# Patient Record
Sex: Female | Born: 1990 | Race: Black or African American | Hispanic: No | Marital: Single | Smoking: Current every day smoker
Health system: Southern US, Community
[De-identification: ages and names within clinical notes are randomized; demographics above are authoritative.]

## PROBLEM LIST (undated history)

## (undated) ENCOUNTER — Inpatient Hospital Stay (HOSPITAL_COMMUNITY): Payer: Self-pay

## (undated) ENCOUNTER — Emergency Department (HOSPITAL_COMMUNITY): Admission: EM | Payer: Medicaid Other | Source: Home / Self Care

## (undated) DIAGNOSIS — T7840XA Allergy, unspecified, initial encounter: Secondary | ICD-10-CM

## (undated) DIAGNOSIS — B999 Unspecified infectious disease: Secondary | ICD-10-CM

## (undated) DIAGNOSIS — G8929 Other chronic pain: Secondary | ICD-10-CM

## (undated) DIAGNOSIS — A749 Chlamydial infection, unspecified: Secondary | ICD-10-CM

## (undated) DIAGNOSIS — F329 Major depressive disorder, single episode, unspecified: Secondary | ICD-10-CM

## (undated) DIAGNOSIS — A599 Trichomoniasis, unspecified: Secondary | ICD-10-CM

## (undated) DIAGNOSIS — O139 Gestational [pregnancy-induced] hypertension without significant proteinuria, unspecified trimester: Secondary | ICD-10-CM

## (undated) DIAGNOSIS — R87629 Unspecified abnormal cytological findings in specimens from vagina: Secondary | ICD-10-CM

## (undated) DIAGNOSIS — E7800 Pure hypercholesterolemia, unspecified: Secondary | ICD-10-CM

## (undated) DIAGNOSIS — R519 Headache, unspecified: Secondary | ICD-10-CM

## (undated) DIAGNOSIS — F32A Depression, unspecified: Secondary | ICD-10-CM

## (undated) DIAGNOSIS — R51 Headache: Secondary | ICD-10-CM

## (undated) HISTORY — DX: Headache: R51

## (undated) HISTORY — DX: Other chronic pain: G89.29

## (undated) HISTORY — PX: INDUCED ABORTION: SHX677

## (undated) HISTORY — DX: Depression, unspecified: F32.A

## (undated) HISTORY — PX: WISDOM TOOTH EXTRACTION: SHX21

## (undated) HISTORY — DX: Chlamydial infection, unspecified: A74.9

## (undated) HISTORY — DX: Headache, unspecified: R51.9

## (undated) HISTORY — DX: Major depressive disorder, single episode, unspecified: F32.9

## (undated) HISTORY — DX: Allergy, unspecified, initial encounter: T78.40XA

---
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
## 2008-07-11 ENCOUNTER — Emergency Department (HOSPITAL_COMMUNITY): Admission: EM | Admit: 2008-07-11 | Discharge: 2008-07-12 | Payer: Self-pay | Admitting: Emergency Medicine

## 2008-07-18 ENCOUNTER — Emergency Department (HOSPITAL_COMMUNITY): Admission: EM | Admit: 2008-07-18 | Discharge: 2008-07-18 | Payer: Self-pay | Admitting: Emergency Medicine

## 2008-08-04 ENCOUNTER — Inpatient Hospital Stay (HOSPITAL_COMMUNITY): Admission: AD | Admit: 2008-08-04 | Discharge: 2008-08-04 | Payer: Self-pay | Admitting: Obstetrics

## 2008-08-05 ENCOUNTER — Encounter (INDEPENDENT_AMBULATORY_CARE_PROVIDER_SITE_OTHER): Payer: Self-pay | Admitting: Obstetrics

## 2008-08-05 ENCOUNTER — Inpatient Hospital Stay (HOSPITAL_COMMUNITY): Admission: AD | Admit: 2008-08-05 | Discharge: 2008-08-05 | Payer: Self-pay | Admitting: Obstetrics

## 2008-08-09 ENCOUNTER — Ambulatory Visit (HOSPITAL_COMMUNITY): Admission: RE | Admit: 2008-08-09 | Discharge: 2008-08-09 | Payer: Self-pay | Admitting: Obstetrics

## 2008-12-02 ENCOUNTER — Emergency Department (HOSPITAL_COMMUNITY): Admission: EM | Admit: 2008-12-02 | Discharge: 2008-12-02 | Payer: Self-pay | Admitting: Family Medicine

## 2008-12-07 ENCOUNTER — Emergency Department (HOSPITAL_COMMUNITY): Admission: EM | Admit: 2008-12-07 | Discharge: 2008-12-08 | Payer: Self-pay | Admitting: Emergency Medicine

## 2009-01-11 ENCOUNTER — Emergency Department (HOSPITAL_COMMUNITY): Admission: EM | Admit: 2009-01-11 | Discharge: 2009-01-12 | Payer: Self-pay | Admitting: Emergency Medicine

## 2009-07-25 ENCOUNTER — Inpatient Hospital Stay (HOSPITAL_COMMUNITY): Admission: AD | Admit: 2009-07-25 | Discharge: 2009-07-25 | Payer: Self-pay | Admitting: Obstetrics

## 2009-10-01 ENCOUNTER — Ambulatory Visit (HOSPITAL_COMMUNITY): Admission: RE | Admit: 2009-10-01 | Discharge: 2009-10-01 | Payer: Self-pay | Admitting: Obstetrics

## 2009-11-17 ENCOUNTER — Inpatient Hospital Stay (HOSPITAL_COMMUNITY): Admission: AD | Admit: 2009-11-17 | Discharge: 2009-11-17 | Payer: Self-pay | Admitting: Obstetrics

## 2009-12-15 ENCOUNTER — Inpatient Hospital Stay (HOSPITAL_COMMUNITY): Admission: AD | Admit: 2009-12-15 | Discharge: 2009-12-15 | Payer: Self-pay | Admitting: Obstetrics

## 2010-02-15 ENCOUNTER — Inpatient Hospital Stay (HOSPITAL_COMMUNITY): Admission: AD | Admit: 2010-02-15 | Discharge: 2010-02-15 | Payer: Self-pay | Admitting: Obstetrics

## 2010-03-05 ENCOUNTER — Inpatient Hospital Stay (HOSPITAL_COMMUNITY): Admission: RE | Admit: 2010-03-05 | Discharge: 2010-03-07 | Payer: Self-pay | Admitting: Obstetrics

## 2010-03-11 ENCOUNTER — Inpatient Hospital Stay (HOSPITAL_COMMUNITY): Admission: AD | Admit: 2010-03-11 | Discharge: 2010-03-11 | Payer: Self-pay | Admitting: Obstetrics

## 2010-06-27 ENCOUNTER — Emergency Department (HOSPITAL_COMMUNITY)
Admission: EM | Admit: 2010-06-27 | Discharge: 2010-06-27 | Payer: Self-pay | Source: Home / Self Care | Admitting: Family Medicine

## 2010-11-03 ENCOUNTER — Encounter: Payer: Self-pay | Admitting: Obstetrics

## 2010-12-30 LAB — CBC
HCT: 32 % — ABNORMAL LOW (ref 36.0–46.0)
HCT: 34.8 % — ABNORMAL LOW (ref 36.0–46.0)
HCT: 37.4 % (ref 36.0–46.0)
Hemoglobin: 11 g/dL — ABNORMAL LOW (ref 12.0–15.0)
Hemoglobin: 12.1 g/dL (ref 12.0–15.0)
Hemoglobin: 12.7 g/dL (ref 12.0–15.0)
MCHC: 34 g/dL (ref 30.0–36.0)
MCHC: 34.3 g/dL (ref 30.0–36.0)
MCHC: 34.7 g/dL (ref 30.0–36.0)
MCV: 85.1 fL (ref 78.0–100.0)
MCV: 86.3 fL (ref 78.0–100.0)
MCV: 86.7 fL (ref 78.0–100.0)
Platelets: 149 10*3/uL — ABNORMAL LOW (ref 150–400)
Platelets: 151 10*3/uL (ref 150–400)
Platelets: 222 10*3/uL (ref 150–400)
RBC: 3.69 MIL/uL — ABNORMAL LOW (ref 3.87–5.11)
RBC: 4.03 MIL/uL (ref 3.87–5.11)
RBC: 4.4 MIL/uL (ref 3.87–5.11)
RDW: 14.8 % (ref 11.5–15.5)
RDW: 14.9 % (ref 11.5–15.5)
RDW: 15.2 % (ref 11.5–15.5)
WBC: 10.1 10*3/uL (ref 4.0–10.5)
WBC: 11.9 10*3/uL — ABNORMAL HIGH (ref 4.0–10.5)
WBC: 19.1 10*3/uL — ABNORMAL HIGH (ref 4.0–10.5)

## 2010-12-30 LAB — CCBB MATERNAL DONOR DRAW

## 2010-12-30 LAB — RPR: RPR Ser Ql: NONREACTIVE

## 2011-01-01 LAB — URINALYSIS, ROUTINE W REFLEX MICROSCOPIC
Bilirubin Urine: NEGATIVE
Glucose, UA: NEGATIVE mg/dL
Hgb urine dipstick: NEGATIVE
Ketones, ur: NEGATIVE mg/dL
Nitrite: NEGATIVE
Protein, ur: NEGATIVE mg/dL
Specific Gravity, Urine: 1.01 (ref 1.005–1.030)
Urobilinogen, UA: 0.2 mg/dL (ref 0.0–1.0)
pH: 7 (ref 5.0–8.0)

## 2011-01-01 LAB — URINE CULTURE

## 2011-01-01 LAB — COMPREHENSIVE METABOLIC PANEL
ALT: 10 U/L (ref 0–35)
AST: 18 U/L (ref 0–37)
Albumin: 3 g/dL — ABNORMAL LOW (ref 3.5–5.2)
Alkaline Phosphatase: 47 U/L (ref 39–117)
BUN: 4 mg/dL — ABNORMAL LOW (ref 6–23)
CO2: 25 mEq/L (ref 19–32)
Calcium: 9 mg/dL (ref 8.4–10.5)
Chloride: 107 mEq/L (ref 96–112)
Creatinine, Ser: 0.49 mg/dL (ref 0.4–1.2)
GFR calc Af Amer: >60 mL/min (ref >60)
GFR calc non Af Amer: >60 mL/min (ref >60)
Glucose, Bld: 70 mg/dL (ref 70–99)
Potassium: 3.9 mEq/L (ref 3.5–5.1)
Sodium: 136 mEq/L (ref 135–145)
Total Bilirubin: 0.1 mg/dL — ABNORMAL LOW (ref 0.3–1.2)
Total Protein: 6.1 g/dL (ref 6.0–8.3)

## 2011-01-01 LAB — CBC
HCT: 33.3 % — ABNORMAL LOW (ref 36.0–46.0)
Hemoglobin: 11.4 g/dL — ABNORMAL LOW (ref 12.0–15.0)
MCHC: 34.3 g/dL (ref 30.0–36.0)
MCV: 90.7 fL (ref 78.0–100.0)
Platelets: 159 10*3/uL (ref 150–400)
RBC: 3.67 MIL/uL — ABNORMAL LOW (ref 3.87–5.11)
RDW: 13.4 % (ref 11.5–15.5)
WBC: 10.6 10*3/uL — ABNORMAL HIGH (ref 4.0–10.5)

## 2011-01-01 LAB — URINE MICROSCOPIC-ADD ON

## 2011-01-01 LAB — WET PREP, GENITAL
Clue Cells Wet Prep HPF POC: NONE SEEN
Trich, Wet Prep: NONE SEEN
Yeast Wet Prep HPF POC: NONE SEEN

## 2011-01-01 LAB — GC/CHLAMYDIA PROBE AMP, GENITAL
Chlamydia, DNA Probe: NEGATIVE
GC Probe Amp, Genital: NEGATIVE

## 2011-01-05 LAB — WET PREP, GENITAL
Clue Cells Wet Prep HPF POC: NONE SEEN
Trich, Wet Prep: NONE SEEN
Yeast Wet Prep HPF POC: NONE SEEN

## 2011-01-05 LAB — GC/CHLAMYDIA PROBE AMP, GENITAL
Chlamydia, DNA Probe: NEGATIVE
GC Probe Amp, Genital: NEGATIVE

## 2011-01-05 LAB — URINALYSIS, ROUTINE W REFLEX MICROSCOPIC
Bilirubin Urine: NEGATIVE
Glucose, UA: NEGATIVE mg/dL
Hgb urine dipstick: NEGATIVE
Ketones, ur: NEGATIVE mg/dL
Nitrite: NEGATIVE
Protein, ur: NEGATIVE mg/dL
Specific Gravity, Urine: 1.01 (ref 1.005–1.030)
Urobilinogen, UA: 0.2 mg/dL (ref 0.0–1.0)
pH: 5.5 (ref 5.0–8.0)

## 2011-01-05 LAB — URINE MICROSCOPIC-ADD ON: RBC / HPF: NONE SEEN RBC/hpf (ref <3)

## 2011-01-05 LAB — CBC
HCT: 32.5 % — ABNORMAL LOW (ref 36.0–46.0)
Hemoglobin: 11.1 g/dL — ABNORMAL LOW (ref 12.0–15.0)
MCHC: 34.2 g/dL (ref 30.0–36.0)
MCV: 89.2 fL (ref 78.0–100.0)
Platelets: 151 10*3/uL (ref 150–400)
RBC: 3.65 MIL/uL — ABNORMAL LOW (ref 3.87–5.11)
RDW: 13.3 % (ref 11.5–15.5)
WBC: 7 10*3/uL (ref 4.0–10.5)

## 2011-01-16 LAB — URINE MICROSCOPIC-ADD ON

## 2011-01-16 LAB — URINALYSIS, ROUTINE W REFLEX MICROSCOPIC
Bilirubin Urine: NEGATIVE
Glucose, UA: NEGATIVE mg/dL
Hgb urine dipstick: NEGATIVE
Ketones, ur: NEGATIVE mg/dL
Nitrite: NEGATIVE
Protein, ur: NEGATIVE mg/dL
Specific Gravity, Urine: <1.005 — ABNORMAL LOW (ref 1.005–1.030)
Urobilinogen, UA: 0.2 mg/dL (ref 0.0–1.0)
pH: 6.5 (ref 5.0–8.0)

## 2011-01-16 LAB — HCG, QUANTITATIVE, PREGNANCY: hCG, Beta Chain, Quant, S: 83722 m[IU]/mL — ABNORMAL HIGH (ref <5)

## 2011-01-16 LAB — CBC
HCT: 35.6 % — ABNORMAL LOW (ref 36.0–46.0)
Hemoglobin: 12.1 g/dL (ref 12.0–15.0)
MCHC: 33.8 g/dL (ref 30.0–36.0)
MCV: 89.9 fL (ref 78.0–100.0)
Platelets: 180 10*3/uL (ref 150–400)
RBC: 3.96 MIL/uL (ref 3.87–5.11)
RDW: 13.6 % (ref 11.5–15.5)
WBC: 9.7 10*3/uL (ref 4.0–10.5)

## 2011-01-16 LAB — GC/CHLAMYDIA PROBE AMP, GENITAL
Chlamydia, DNA Probe: NEGATIVE
GC Probe Amp, Genital: NEGATIVE

## 2011-01-16 LAB — WET PREP, GENITAL
Clue Cells Wet Prep HPF POC: NONE SEEN
Trich, Wet Prep: NONE SEEN
Yeast Wet Prep HPF POC: NONE SEEN

## 2011-01-16 LAB — POCT PREGNANCY, URINE: Preg Test, Ur: POSITIVE

## 2011-01-22 LAB — HEPATIC FUNCTION PANEL
ALT: 11 U/L (ref 0–35)
AST: 16 U/L (ref 0–37)
Albumin: 3.8 g/dL (ref 3.5–5.2)
Alkaline Phosphatase: 63 U/L (ref 39–117)
Bilirubin, Direct: 0.2 mg/dL (ref 0.0–0.3)
Indirect Bilirubin: 0.3 mg/dL (ref 0.3–0.9)
Total Bilirubin: 0.5 mg/dL (ref 0.3–1.2)
Total Protein: 7 g/dL (ref 6.0–8.3)

## 2011-01-22 LAB — BASIC METABOLIC PANEL
BUN: 5 mg/dL — ABNORMAL LOW (ref 6–23)
CO2: 26 mEq/L (ref 19–32)
Calcium: 8.7 mg/dL (ref 8.4–10.5)
Chloride: 106 mEq/L (ref 96–112)
Creatinine, Ser: 0.91 mg/dL (ref 0.4–1.2)
GFR calc Af Amer: >60 mL/min (ref >60)
GFR calc non Af Amer: >60 mL/min (ref >60)
Glucose, Bld: 80 mg/dL (ref 70–99)
Potassium: 3.3 mEq/L — ABNORMAL LOW (ref 3.5–5.1)
Sodium: 137 mEq/L (ref 135–145)

## 2011-01-22 LAB — DIFFERENTIAL
Basophils Absolute: 0 10*3/uL (ref 0.0–0.1)
Basophils Relative: 0 % (ref 0–1)
Eosinophils Absolute: 0.1 10*3/uL (ref 0.0–0.7)
Eosinophils Relative: 1 % (ref 0–5)
Lymphocytes Relative: 36 % (ref 12–46)
Lymphs Abs: 2.7 10*3/uL (ref 0.7–4.0)
Monocytes Absolute: 0.5 10*3/uL (ref 0.1–1.0)
Monocytes Relative: 7 % (ref 3–12)
Neutro Abs: 4.1 10*3/uL (ref 1.7–7.7)
Neutrophils Relative %: 55 % (ref 43–77)

## 2011-01-22 LAB — URINALYSIS, ROUTINE W REFLEX MICROSCOPIC
Glucose, UA: NEGATIVE mg/dL
Hgb urine dipstick: NEGATIVE
Ketones, ur: 15 mg/dL — AB
Nitrite: NEGATIVE
Protein, ur: NEGATIVE mg/dL
Specific Gravity, Urine: 1.024 (ref 1.005–1.030)
Urobilinogen, UA: 1 mg/dL (ref 0.0–1.0)
pH: 6 (ref 5.0–8.0)

## 2011-01-22 LAB — CBC
HCT: 42.8 % (ref 36.0–46.0)
Hemoglobin: 14 g/dL (ref 12.0–15.0)
MCHC: 32.8 g/dL (ref 30.0–36.0)
MCV: 88.2 fL (ref 78.0–100.0)
Platelets: 212 10*3/uL (ref 150–400)
RBC: 4.86 MIL/uL (ref 3.87–5.11)
RDW: 13.8 % (ref 11.5–15.5)
WBC: 7.4 10*3/uL (ref 4.0–10.5)

## 2011-01-22 LAB — URINE MICROSCOPIC-ADD ON

## 2011-01-22 LAB — POCT PREGNANCY, URINE: Preg Test, Ur: NEGATIVE

## 2011-01-22 LAB — LIPASE, BLOOD: Lipase: 18 U/L (ref 11–59)

## 2011-01-28 LAB — URINALYSIS, ROUTINE W REFLEX MICROSCOPIC
Bilirubin Urine: NEGATIVE
Glucose, UA: NEGATIVE mg/dL
Hgb urine dipstick: NEGATIVE
Ketones, ur: NEGATIVE mg/dL
Nitrite: NEGATIVE
Protein, ur: NEGATIVE mg/dL
Specific Gravity, Urine: 1.012 (ref 1.005–1.030)
Urobilinogen, UA: 1 mg/dL (ref 0.0–1.0)
pH: 6 (ref 5.0–8.0)

## 2011-01-28 LAB — RAPID STREP SCREEN (MED CTR MEBANE ONLY): Streptococcus, Group A Screen (Direct): NEGATIVE

## 2011-02-28 NOTE — H&P (Signed)
NAME:  Katelyn Mcfarland, RIEF                         ACCOUNT NO.:  [id number]   MEDICAL RECORD NO.:  [id number]                   PATIENT TYPE:  INP   LOCATION:  0102                                 FACILITY:  [facility]   PHYSICIAN:  Cindie Crumbly, M.D.               DATE OF BIRTH:  1990/12/08   DATE OF ADMISSION:  07/27/2002  DATE OF DISCHARGE:                         PSYCHIATRIC ADMISSION ASSESSMENT   PATIENT IDENTIFICATION:  This 20 year old African-American female was  involuntarily admitted complaining of depression with suicidal ideation with  a plan to cut her wrists.  She had written a suicide note in school and when  confronted about the note, she admitted to suicidal ideation with a plan to  cut her wrists and refused to contract for safety.   HISTORY OF PRESENT ILLNESS:  The patient complains of an increasingly  depressed, irritable, and angry mood most of the day nearly every day,  anhedonia, giving up on activities previously found pleasurable.  She  reports that her symptoms of depression have been worsening over the past  one to two years.  She admits to psychomotor agitation, decreased  concentration and energy level, increased symptoms of fatigue, decreased  appetite, weight gain, and insomnia.  She admits to feelings of  hopelessness, helplessness, and worthlessness.  She continues to refuse to  contract for safety.   PAST PSYCHIATRIC HISTORY:  The patient's past psychiatric history is  significant for oppositional and defiant behavior.  She was followed in  outpatient therapy at [facility] in 2002 where she was seen on a weekly  basis but has not been in any therapy for the past year.  She has no history  of previous inpatient treatment or previous psychiatric treatment.   SUBSTANCE ABUSE HISTORY:  She denies any use of alcohol, tobacco, or street  drugs.   PAST MEDICAL HISTORY:  The patient's past medical history is significant for  obesity and having tympanostomy  tubes in her ears at age 82 months.   ALLERGIES:  She has no known drug allergies or sensitivities.   CURRENT MEDICATIONS:  She is on no current medication.   FAMILY AND SOCIAL HISTORY:  The patient lives with her mother and 16-year-  old sister.  Paternal uncle has a history of polysubstance dependence.  The  patient otherwise denies any family psychiatric history.  The patient is  currently in the eighth grade.   MENTAL STATUS EXAM:  The patient presents as a well developed, well  nourished, obese, adolescent African-American female who is alert and  oriented x 4, psychomotor agitated, and whose appearance is compatible with  her stated age.  Speech is coherent with a decreased rate and volume of  speech, increased speech latency.  She displays no looseness of  associations, phonemic errors, or evidence of a thought disorder.  Her  concentration is decreased.  She displays poor impulse control.  Her affect  and mood are  depressed and irritable.  Immediate recall, short-term memory,  and remote memory are intact.  Similarities and differences are within  normal limits and she is able to abstract to simple proverbs.  Her thought  processes are goal directed.   ADMISSION DIAGNOSES:    AXIS I:  1. Major depression, single episode, severe without psychosis.  2. Oppositional defiant disorder.  3. Rule out conduct disorder.   AXIS II:  1. Rule out learning disorder, not otherwise specified.  2. Rule out personality disorder, not otherwise specified.   AXIS III:  Obesity.   AXIS IV:  Severe.   AXIS V:  20   ASSETS AND STRENGTHS:  Her mother is very supportive of her.   INITIAL PLAN OF CARE:  Initial plan of care is to begin the patient on a  trial of Lexapro.  I discussed with the patient and her mother the risks,  benefits, side effects, and alternatives including the alternative of using  no medication.  They appear to understand and agree to this trial of  Lexapro.   Psychotherapy will focus on improving the patient's impulse  control, decreasing cognitive distortions and potential for self-harm.  A  laboratory workup will also be initiated to rule out any other medical  problems contributing to her symptomatology.   ESTIMATED LENGTH OF STAY:  The estimated length of stay for the patient on  the inpatient unit is five to seven days.   POST HOSPITAL CARE PLAN:  Initial discharge plan is to discharge the patient  to home.                                               Cindie Crumbly, M.D.    TS/MEDQ  D:  07/28/2002  T:  07/29/2002  Job:  [id number]

## 2011-02-28 NOTE — Discharge Summary (Signed)
NAME:  Katelyn Mcfarland, Katelyn Mcfarland                         ACCOUNT NO.:  [id number]   MEDICAL RECORD NO.:  [id number]                   PATIENT TYPE:  INP   LOCATION:  0102                                 FACILITY:  [facility]   PHYSICIAN:  Cindie Crumbly, M.D.               DATE OF BIRTH:  December 15, 1990   DATE OF ADMISSION:  07/27/2002  DATE OF DISCHARGE:                                 DISCHARGE SUMMARY   REASON FOR ADMISSION:  This 20 year old African-American female was  involuntarily admitted complaining of depression and suicidal ideation with  a plan to cut her wrists.  For further history of present illness, please  see the patient's psychiatry admission assessment.   PHYSICAL EXAMINATION:  At the time of admission was significant for obesity.  She had an otherwise unremarkable physical examination.   LABORATORY EXAMINATION:  The patient underwent a laboratory workup to rule  out any medical problems contributing to her symptomatology.  A urine probe  for gonorrhea and chlamydia were negative.  TSH and free T4 were within  normal limits.  Hepatic panel was within normal limits.  A GGT was within  normal limits.  Routine chem panel was within normal limits.  CBC was within  normal limits.  Urine drug screen was negative.  Urine pregnancy test was  negative.  A UA was unremarkable.  An RPR was nonreactive.  The patient  received no x-rays, no special procedures, no additional consultations.  She  sustained no complications during the course of this hospitalization.   HOSPITAL COURSE:  On admission, the patient was psychomotor agitated.  Her  affect and mood were depressed and irritable.  Concentration was decreased.  She displayed poor impulse control.  She was begun on a trial of Lexapro.  She tolerated this medication well without side effects.  She has been  actively participating in all aspects of the therapeutic treatment program.  At the present time she is denying any homicidal or  suicidal ideation, is  motivated for outpatient therapy, no longer appears to be a danger to  herself or others,  and consequently is felt to have reached her maximum  benefits of hospitalization and is ready for discharge to a less restricted  alternative setting.   CONDITION ON DISCHARGE:  Improved.   DISCHARGE DIAGNOSES:   AXIS I:  1. Major depression, single episode, severe, without psychosis.  2. Oppositional-defiant disorder.  3. Rule out conduct disorder.   AXIS II:  1. Rule out learning disorder not otherwise specified.  2. Rule out personality disorder not otherwise specified.   AXIS III:  Obesity.   AXIS IV:  Severe.   AXIS V:  Code 20 on admission, code 30 on discharge.   FURTHER EVALUATION AND TREATMENT RECOMMENDATIONS:  1. The patient is discharged to home.  2. She is discharged on an unrestricted level of activity and a regular     diet.  3.  She will follow up with her outpatient psychiatrist at [facility] for all further aspects of her psychiatric care and     consequently I will sign off on the case at this time.  She will follow     up with her primary care physician for all further aspects of her medical     care.   DISCHARGE MEDICATIONS:  Lexapro 10 mg p.o. q.d.                                                 Cindie Crumbly, M.D.    TS/MEDQ  D:  08/02/2002  T:  08/02/2002  Job:  [id number]

## 2011-04-24 LAB — HM PAP SMEAR: HM Pap smear: NEGATIVE

## 2011-07-03 LAB — COMPREHENSIVE METABOLIC PANEL
ALT: 15
ALT: 16
ALT: 19
AST: 23
AST: 23
AST: 27
Albumin: 2.3 — ABNORMAL LOW
Albumin: 2.7 — ABNORMAL LOW
Albumin: 3.1 — ABNORMAL LOW
Alkaline Phosphatase: 112
Alkaline Phosphatase: 129 — ABNORMAL HIGH
Alkaline Phosphatase: 146 — ABNORMAL HIGH
BUN: 3 — ABNORMAL LOW
BUN: 4 — ABNORMAL LOW
BUN: 7
CO2: 22
CO2: 23
CO2: 26
Calcium: 6.6 — ABNORMAL LOW
Calcium: 7.4 — ABNORMAL LOW
Calcium: 8.9
Chloride: 101
Chloride: 103
Chloride: 105
Creatinine, Ser: 0.71
Creatinine, Ser: 0.77
Creatinine, Ser: 0.8
GFR calc Af Amer: >60
GFR calc Af Amer: >60
GFR calc Af Amer: >60
GFR calc non Af Amer: >60
GFR calc non Af Amer: >60
GFR calc non Af Amer: >60
Glucose, Bld: 82
Glucose, Bld: 94
Glucose, Bld: 95
Potassium: 3.8
Potassium: 4.1
Potassium: 4.2
Sodium: 130 — ABNORMAL LOW
Sodium: 136
Sodium: 136
Total Bilirubin: 0.4
Total Bilirubin: 0.6
Total Bilirubin: 0.8
Total Protein: 5.3 — ABNORMAL LOW
Total Protein: 6
Total Protein: 6.6

## 2011-07-03 LAB — CBC
HCT: 33.5 — ABNORMAL LOW
HCT: 36.4
HCT: 39.7
Hemoglobin: 11.7 — ABNORMAL LOW
Hemoglobin: 12.7
Hemoglobin: 13.7
MCHC: 34.4
MCHC: 34.8
MCHC: 35
MCV: 86.8
MCV: 87.6
MCV: 87.7
Platelets: 138 — ABNORMAL LOW
Platelets: 139 — ABNORMAL LOW
Platelets: 166
RBC: 3.83 — ABNORMAL LOW
RBC: 4.19
RBC: 4.53
RDW: 14.2
RDW: 14.3
RDW: 14.5
WBC: 10.5
WBC: 10.9 — ABNORMAL HIGH
WBC: 6.2

## 2011-07-03 LAB — URINALYSIS, ROUTINE W REFLEX MICROSCOPIC
Bilirubin Urine: NEGATIVE
Bilirubin Urine: NEGATIVE
Glucose, UA: NEGATIVE
Glucose, UA: NEGATIVE
Hgb urine dipstick: NEGATIVE
Ketones, ur: NEGATIVE
Ketones, ur: NEGATIVE
Nitrite: NEGATIVE
Nitrite: NEGATIVE
Protein, ur: 100 — AB
Protein, ur: NEGATIVE
Specific Gravity, Urine: 1.015
Specific Gravity, Urine: 1.02
Urobilinogen, UA: 0.2
Urobilinogen, UA: 0.2
pH: 5.5
pH: 6

## 2011-07-03 LAB — URINE MICROSCOPIC-ADD ON

## 2011-07-03 LAB — RPR: RPR Ser Ql: NONREACTIVE

## 2011-07-03 LAB — URIC ACID
Uric Acid, Serum: 5.8
Uric Acid, Serum: 6
Uric Acid, Serum: 6.1

## 2011-07-03 LAB — LACTATE DEHYDROGENASE
LDH: 145
LDH: 160
LDH: 207

## 2011-07-03 LAB — MAGNESIUM: Magnesium: 6.3

## 2011-07-14 LAB — URINALYSIS, ROUTINE W REFLEX MICROSCOPIC
Bilirubin Urine: NEGATIVE
Bilirubin Urine: NEGATIVE
Glucose, UA: NEGATIVE
Glucose, UA: NEGATIVE
Hgb urine dipstick: NEGATIVE
Ketones, ur: NEGATIVE
Ketones, ur: NEGATIVE
Nitrite: NEGATIVE
Nitrite: NEGATIVE
Protein, ur: NEGATIVE
Protein, ur: NEGATIVE
Specific Gravity, Urine: 1.013
Specific Gravity, Urine: 1.015
Urobilinogen, UA: 0.2
Urobilinogen, UA: 0.2
pH: 6.5
pH: 6

## 2011-07-14 LAB — DIFFERENTIAL
Basophils Absolute: 0
Basophils Relative: 0
Eosinophils Absolute: 0.2
Eosinophils Relative: 2
Lymphocytes Relative: 36
Lymphs Abs: 2.8
Monocytes Absolute: 0.6
Monocytes Relative: 7
Neutro Abs: 4.3
Neutrophils Relative %: 54

## 2011-07-14 LAB — WET PREP, GENITAL
Clue Cells Wet Prep HPF POC: NONE SEEN
Trich, Wet Prep: NONE SEEN

## 2011-07-14 LAB — URINE MICROSCOPIC-ADD ON

## 2011-07-14 LAB — GC/CHLAMYDIA PROBE AMP, GENITAL
Chlamydia, DNA Probe: NEGATIVE
GC Probe Amp, Genital: NEGATIVE

## 2011-07-14 LAB — CBC
HCT: 36.8
Hemoglobin: 12.4
MCHC: 33.8
MCV: 86.7
Platelets: 236
RBC: 4.25
RDW: 13.9
WBC: 7.9

## 2011-07-14 LAB — POCT PREGNANCY, URINE: Preg Test, Ur: POSITIVE

## 2011-07-24 LAB — URINALYSIS, ROUTINE W REFLEX MICROSCOPIC
Bilirubin Urine: NEGATIVE
Glucose, UA: NEGATIVE
Hgb urine dipstick: NEGATIVE
Ketones, ur: NEGATIVE
Nitrite: NEGATIVE
Protein, ur: NEGATIVE
Specific Gravity, Urine: 1.015
Urobilinogen, UA: 0.2
pH: 7

## 2011-07-24 LAB — DIFFERENTIAL
Basophils Absolute: 0
Basophils Relative: 0
Eosinophils Absolute: 0.1
Eosinophils Relative: 1
Lymphocytes Relative: 25
Lymphs Abs: 2.4
Monocytes Absolute: 0.6
Monocytes Relative: 7
Neutro Abs: 6.3
Neutrophils Relative %: 67

## 2011-07-24 LAB — I-STAT 8, (EC8 V) (CONVERTED LAB)
Acid-base deficit: 5 — ABNORMAL HIGH
BUN: <3 — ABNORMAL LOW
Bicarbonate: 18.5 — ABNORMAL LOW
Chloride: 107
Glucose, Bld: 82
HCT: 38
Hemoglobin: 12.9
Operator id: 288831
Potassium: 3.6
Sodium: 138
TCO2: 19
pCO2, Ven: 29.3 — ABNORMAL LOW
pH, Ven: 7.409 — ABNORMAL HIGH

## 2011-07-24 LAB — CBC
HCT: 34.5 — ABNORMAL LOW
Hemoglobin: 11.9 — ABNORMAL LOW
MCHC: 34.4
MCV: 86.3
Platelets: 200
RBC: 4
RDW: 14.8 — ABNORMAL HIGH
WBC: 9.5

## 2011-07-24 LAB — POCT I-STAT CREATININE
Creatinine, Ser: 0.7
Operator id: 288831

## 2011-07-29 LAB — URINALYSIS, ROUTINE W REFLEX MICROSCOPIC
Glucose, UA: NEGATIVE
Hgb urine dipstick: NEGATIVE
Ketones, ur: 15 — AB
Nitrite: NEGATIVE
Protein, ur: 30 — AB
Specific Gravity, Urine: 1.01
Urobilinogen, UA: 0.2
pH: 6.5

## 2011-07-29 LAB — GC/CHLAMYDIA PROBE AMP, GENITAL
Chlamydia, DNA Probe: NEGATIVE
GC Probe Amp, Genital: NEGATIVE

## 2011-07-29 LAB — WET PREP, GENITAL
Clue Cells Wet Prep HPF POC: NONE SEEN
Trich, Wet Prep: NONE SEEN
Yeast Wet Prep HPF POC: NONE SEEN

## 2011-07-29 LAB — URINE MICROSCOPIC-ADD ON

## 2011-07-29 LAB — POCT PREGNANCY, URINE
Operator id: 11355
Preg Test, Ur: POSITIVE

## 2011-10-13 ENCOUNTER — Emergency Department (INDEPENDENT_AMBULATORY_CARE_PROVIDER_SITE_OTHER)
Admission: EM | Admit: 2011-10-13 | Discharge: 2011-10-13 | Disposition: A | Discharge status: Home / Self Care | Payer: Self-pay | Source: Home / Self Care | Attending: Emergency Medicine | Admitting: Emergency Medicine

## 2011-10-13 DIAGNOSIS — N898 Other specified noninflammatory disorders of vagina: Secondary | ICD-10-CM

## 2011-10-13 DIAGNOSIS — M549 Dorsalgia, unspecified: Secondary | ICD-10-CM

## 2011-10-13 HISTORY — DX: Pure hypercholesterolemia, unspecified: E78.00

## 2011-10-13 LAB — POCT URINALYSIS DIP (DEVICE)
Bilirubin Urine: NEGATIVE
Glucose, UA: NEGATIVE mg/dL
Ketones, ur: NEGATIVE mg/dL
Leukocytes, UA: NEGATIVE
Nitrite: NEGATIVE
Protein, ur: NEGATIVE mg/dL
Specific Gravity, Urine: 1.01 (ref 1.005–1.030)
Urobilinogen, UA: 0.2 mg/dL (ref 0.0–1.0)
pH: 6.5 (ref 5.0–8.0)

## 2011-10-13 LAB — WET PREP, GENITAL
Clue Cells Wet Prep HPF POC: NONE SEEN
Trich, Wet Prep: NONE SEEN

## 2011-10-13 MED ORDER — FLUCONAZOLE 150 MG PO TABS: 150.0000 mg | ORAL_TABLET | Freq: Once | ORAL | Status: AC

## 2011-10-13 MED ORDER — METRONIDAZOLE 500 MG PO TABS: 500.0000 mg | ORAL_TABLET | Freq: Two times a day (BID) | ORAL | Status: AC

## 2011-10-13 NOTE — ED Provider Notes (Signed)
Medical screening examination/treatment/procedure(s) were performed by the resident  and as supervising physician I was immediately available for consultation/collaboration.  Luiz Blare MD   Luiz Blare, MD 10/13/11 2120

## 2011-10-13 NOTE — ED Notes (Signed)
C/o vaginal discharge with odor for 2-3 days.  Denies dysuria.  Also c/o lower abdominal pain for 2-3 day and lower back pain (she has had for years) that is getting worse the last 2-3 days.

## 2011-10-13 NOTE — ED Provider Notes (Signed)
Katelyn Mcfarland is a 20 year old woman who presents with mild lower back pain mild lower abdominal pain and vaginal discharge for the past few days. She does have chronic low back pain however it worsened recently. One month ago she was treated for BV which resolved her discharge. She's been using aspirin and Advil which hasn't helped much. She denies any dysuria urinary frequency or urinary urgency. She denies any fevers chills nausea vomiting diarrhea.  She denies any weakness numbness or bowel or bladder dysfunction.  PMH reviewed.  ROS as above otherwise neg Medications reviewed. Aspirin and Advil  Exam:  BP 113/76  Pulse 76  Temp(Src) 98.6 F (37 C) (Oral)  Resp 16  SpO2 100% Gen: Well NAD HEENT: EOMI,  MMM Lungs: CTABL Nl WOB Heart: RRR no MRG Abd: NABS, NT, ND Exts: Non edematous BL  LE, warm and well perfused.  MSK: Nontender over spinal midline gait reflexes strength sensation intact. Patient able to get onto and off exam table by herself.   GYN: Clitoral piercing  but otherwise normal external genitalia.  Normal vaginal canal. White discharge present normal cervix. Normal adnexa and uterus. Nontender  POCT UA: neg for nitrates or LE  Assessment and plan: 20 year old woman with abdominal pain and discharge with a past history of BV. I think the risk here is bacterial vaginosis but I'll treat both with fluconazole metronidazole. Wet prep here to confirm no trichomonas and gonorrhea chlamydia test will confirm no other sexually transmitted infections.   She expresses understanding.   her back pain is consistent with musculoskeletal back pain. Plan to treat symptoms with ibuprofen and exercises. Gave red flag signs or symptoms with handout.  We'll followup with primary care provider if no improvement in one week.  Expressed the importance of staying active.  Clementeen Graham 10/13/11 1909  Clementeen Graham 10/13/11 1913

## 2011-10-14 LAB — GC/CHLAMYDIA PROBE AMP, GENITAL
Chlamydia, DNA Probe: NEGATIVE
GC Probe Amp, Genital: NEGATIVE

## 2011-10-14 NOTE — ED Notes (Signed)
Wet prep: Few yeast, mod. WBC's.  Pt. adequately treated with Diflucan.  GC/Chlamydia pending. Vassie Moselle 10/14/2011

## 2011-10-16 NOTE — ED Notes (Signed)
GC/Chlamydia neg. Vassie Moselle 10/16/2011

## 2012-03-07 ENCOUNTER — Emergency Department (HOSPITAL_COMMUNITY)
Admission: EM | Admit: 2012-03-07 | Discharge: 2012-03-07 | Disposition: A | Discharge status: Home / Self Care | Payer: PRIVATE HEALTH INSURANCE | Attending: Emergency Medicine | Admitting: Emergency Medicine

## 2012-03-07 ENCOUNTER — Encounter (HOSPITAL_COMMUNITY): Payer: Self-pay | Admitting: *Deleted

## 2012-03-07 DIAGNOSIS — F17200 Nicotine dependence, unspecified, uncomplicated: Secondary | ICD-10-CM | POA: Insufficient documentation

## 2012-03-07 DIAGNOSIS — K2970 Gastritis, unspecified, without bleeding: Secondary | ICD-10-CM

## 2012-03-07 DIAGNOSIS — E7800 Pure hypercholesterolemia, unspecified: Secondary | ICD-10-CM | POA: Insufficient documentation

## 2012-03-07 DIAGNOSIS — R110 Nausea: Secondary | ICD-10-CM | POA: Insufficient documentation

## 2012-03-07 DIAGNOSIS — R1011 Right upper quadrant pain: Secondary | ICD-10-CM | POA: Insufficient documentation

## 2012-03-07 LAB — COMPREHENSIVE METABOLIC PANEL
ALT: 12 U/L (ref 0–35)
AST: 21 U/L (ref 0–37)
Albumin: 4 g/dL (ref 3.5–5.2)
Alkaline Phosphatase: 59 U/L (ref 39–117)
BUN: 14 mg/dL (ref 6–23)
CO2: 28 mEq/L (ref 19–32)
Calcium: 9.6 mg/dL (ref 8.4–10.5)
Chloride: 104 mEq/L (ref 96–112)
Creatinine, Ser: 0.94 mg/dL (ref 0.50–1.10)
GFR calc Af Amer: >90 mL/min (ref >90)
GFR calc non Af Amer: 85 mL/min — ABNORMAL LOW (ref >90)
Glucose, Bld: 90 mg/dL (ref 70–99)
Potassium: 4.1 mEq/L (ref 3.5–5.1)
Sodium: 140 mEq/L (ref 135–145)
Total Bilirubin: 0.2 mg/dL — ABNORMAL LOW (ref 0.3–1.2)
Total Protein: 7.6 g/dL (ref 6.0–8.3)

## 2012-03-07 LAB — URINALYSIS, ROUTINE W REFLEX MICROSCOPIC
Bilirubin Urine: NEGATIVE
Glucose, UA: NEGATIVE mg/dL
Ketones, ur: NEGATIVE mg/dL
Leukocytes, UA: NEGATIVE
Nitrite: NEGATIVE
Protein, ur: NEGATIVE mg/dL
Specific Gravity, Urine: 1.014 (ref 1.005–1.030)
Urobilinogen, UA: 1 mg/dL (ref 0.0–1.0)
pH: 6 (ref 5.0–8.0)

## 2012-03-07 LAB — URINE MICROSCOPIC-ADD ON

## 2012-03-07 LAB — DIFFERENTIAL
Basophils Absolute: 0 10*3/uL (ref 0.0–0.1)
Basophils Relative: 0 % (ref 0–1)
Eosinophils Absolute: 0.2 10*3/uL (ref 0.0–0.7)
Eosinophils Relative: 2 % (ref 0–5)
Lymphocytes Relative: 36 % (ref 12–46)
Lymphs Abs: 2.8 10*3/uL (ref 0.7–4.0)
Monocytes Absolute: 0.4 10*3/uL (ref 0.1–1.0)
Monocytes Relative: 5 % (ref 3–12)
Neutro Abs: 4.4 10*3/uL (ref 1.7–7.7)
Neutrophils Relative %: 56 % (ref 43–77)

## 2012-03-07 LAB — CBC
HCT: 38.5 % (ref 36.0–46.0)
Hemoglobin: 13.1 g/dL (ref 12.0–15.0)
MCH: 29.3 pg (ref 26.0–34.0)
MCHC: 34 g/dL (ref 30.0–36.0)
MCV: 86.1 fL (ref 78.0–100.0)
Platelets: 203 10*3/uL (ref 150–400)
RBC: 4.47 MIL/uL (ref 3.87–5.11)
RDW: 12.8 % (ref 11.5–15.5)
WBC: 7.8 10*3/uL (ref 4.0–10.5)

## 2012-03-07 LAB — PREGNANCY, URINE: Preg Test, Ur: NEGATIVE

## 2012-03-07 LAB — LIPASE, BLOOD: Lipase: 32 U/L (ref 11–59)

## 2012-03-07 MED ORDER — OMEPRAZOLE 20 MG PO CPDR: 20.0000 mg | DELAYED_RELEASE_CAPSULE | Freq: Every day | ORAL | Status: DC

## 2012-03-07 MED ORDER — HYDROCODONE-ACETAMINOPHEN 5-325 MG PO TABS
1.0000 | ORAL_TABLET | Freq: Once | ORAL | Status: AC
  Administered 2012-03-07: 1 via ORAL
  Filled 2012-03-07: qty 1

## 2012-03-07 MED ORDER — GI COCKTAIL ~~LOC~~
30.0000 mL | Freq: Once | ORAL | Status: AC
  Administered 2012-03-07: 30 mL via ORAL
  Filled 2012-03-07: qty 30

## 2012-03-07 MED ORDER — FAMOTIDINE 20 MG PO TABS: 20.0000 mg | ORAL_TABLET | Freq: Two times a day (BID) | ORAL | Status: DC

## 2012-03-07 NOTE — ED Notes (Signed)
abd pain for 2 weeks nausea.  lmp none has implant

## 2012-03-07 NOTE — ED Notes (Signed)
Pt reports epigastric pain for the past few weeks, progressively worse. Pt states recently she has had difficulty sleeping at night d/t acid rising up in her throat - pt denies n/v/d or fever. Pt in no acute distress on assessment. Family x1 at bedside.

## 2012-03-07 NOTE — ED Provider Notes (Signed)
History     CSN: [id number]  Arrival date & time 03/07/12  2049   First MD Initiated Contact with Patient 03/07/12 2144      Chief Complaint  Patient presents with  . Abdominal Pain    HPI  History provided by the patient. Patient is a 21 year old female with no significant past medical history who presents with complaints of intermittent right upper quadrant abdominal pain. Patient reports that symptoms first began several weeks ago. She does state that she noticed increased symptoms over the past 2 days. He has been waxing and waning and is not constant. Patient does state that she has been trying to quit smoking and she occasionally noticed increased pain when she "sneaks" a cigarette. Patient has not noticed any changes of symptoms around eating. Symptoms have been associated with slight nausea. Patient denies any fever, chills, sweats, vomiting, diarrhea or constipation. She denies any lower abdominal pain, dysuria, urinary frequency, hematuria, vaginal bleeding or vaginal discharge.     Past Medical History  Diagnosis Date  . High cholesterol     No past surgical history on file.  No family history on file.  History  Substance Use Topics  . Smoking status: Current Everyday Smoker -- 1.0 packs/day  . Smokeless tobacco: Not on file  . Alcohol Use: Yes    OB History    Grav Para Term Preterm Abortions TAB SAB Ect Mult Living                  Review of Systems  Constitutional: Negative for fever, chills and appetite change.  Respiratory: Negative for cough and shortness of breath.   Cardiovascular: Negative for chest pain.  Gastrointestinal: Positive for nausea and abdominal pain. Negative for vomiting, diarrhea and constipation.  Genitourinary: Negative for dysuria, frequency, hematuria, flank pain, vaginal bleeding and vaginal discharge.  Skin: Negative for rash.    Allergies  Review of patient's allergies indicates no known allergies.  Home Medications    Current Outpatient Rx  Name Route Sig Dispense Refill  . IBUPROFEN 200 MG PO TABS Oral Take 600 mg by mouth every 6 (six) hours as needed. For pain      There were no vitals taken for this visit.  Physical Exam  Nursing note and vitals reviewed. Constitutional: She is oriented to person, place, and time. She appears well-developed and well-nourished. No distress.  HENT:  Head: Normocephalic and atraumatic.  Cardiovascular: Normal rate and regular rhythm.   No murmur heard. Pulmonary/Chest: Effort normal and breath sounds normal. No respiratory distress. She has no wheezes. She has no rales.  Abdominal: Soft. She exhibits no distension. There is no tenderness. There is no rebound, no guarding, no CVA tenderness, no tenderness at McBurney's point and negative Murphy's sign.       Patient without any significant tenderness. Patient is obese. Abdomen is soft.  Musculoskeletal: She exhibits no edema and no tenderness.  Neurological: She is alert and oriented to person, place, and time.  Skin: Skin is warm and dry. No rash noted.  Psychiatric: She has a normal mood and affect. Her behavior is normal.    ED Course  Procedures   Results for orders placed during the hospital encounter of 03/07/12  URINALYSIS, ROUTINE W REFLEX MICROSCOPIC      Component Value Range   Color, Urine YELLOW  YELLOW    APPearance CLEAR  CLEAR    Specific Gravity, Urine 1.014  1.005 - 1.030    pH 6.0  5.0 - 8.0    Glucose, UA NEGATIVE  NEGATIVE (mg/dL)   Hgb urine dipstick SMALL (*) NEGATIVE    Bilirubin Urine NEGATIVE  NEGATIVE    Ketones, ur NEGATIVE  NEGATIVE (mg/dL)   Protein, ur NEGATIVE  NEGATIVE (mg/dL)   Urobilinogen, UA 1.0  0.0 - 1.0 (mg/dL)   Nitrite NEGATIVE  NEGATIVE    Leukocytes, UA NEGATIVE  NEGATIVE   PREGNANCY, URINE      Component Value Range   Preg Test, Ur NEGATIVE  NEGATIVE   URINE MICROSCOPIC-ADD ON      Component Value Range   Squamous Epithelial / LPF RARE  RARE    WBC, UA  0-2  <3 (WBC/hpf)   RBC / HPF 3-6  <3 (RBC/hpf)   Bacteria, UA RARE  RARE   COMPREHENSIVE METABOLIC PANEL      Component Value Range   Sodium 140  135 - 145 (mEq/L)   Potassium 4.1  3.5 - 5.1 (mEq/L)   Chloride 104  96 - 112 (mEq/L)   CO2 28  19 - 32 (mEq/L)   Glucose, Bld 90  70 - 99 (mg/dL)   BUN 14  6 - 23 (mg/dL)   Creatinine, Ser 4.09  0.50 - 1.10 (mg/dL)   Calcium 9.6  8.4 - 81.1 (mg/dL)   Total Protein 7.6  6.0 - 8.3 (g/dL)   Albumin 4.0  3.5 - 5.2 (g/dL)   AST 21  0 - 37 (U/L)   ALT 12  0 - 35 (U/L)   Alkaline Phosphatase 59  39 - 117 (U/L)   Total Bilirubin 0.2 (*) 0.3 - 1.2 (mg/dL)   GFR calc non Af Amer 85 (*) >90 (mL/min)   GFR calc Af Amer >90  >90 (mL/min)  LIPASE, BLOOD      Component Value Range   Lipase 32  11 - 59 (U/L)  CBC      Component Value Range   WBC 7.8  4.0 - 10.5 (K/uL)   RBC 4.47  3.87 - 5.11 (MIL/uL)   Hemoglobin 13.1  12.0 - 15.0 (g/dL)   HCT 91.4  78.2 - 95.6 (%)   MCV 86.1  78.0 - 100.0 (fL)   MCH 29.3  26.0 - 34.0 (pg)   MCHC 34.0  30.0 - 36.0 (g/dL)   RDW 21.3  08.6 - 57.8 (%)   Platelets 203  150 - 400 (K/uL)  DIFFERENTIAL      Component Value Range   Neutrophils Relative 56  43 - 77 (%)   Neutro Abs 4.4  1.7 - 7.7 (K/uL)   Lymphocytes Relative 36  12 - 46 (%)   Lymphs Abs 2.8  0.7 - 4.0 (K/uL)   Monocytes Relative 5  3 - 12 (%)   Monocytes Absolute 0.4  0.1 - 1.0 (K/uL)   Eosinophils Relative 2  0 - 5 (%)   Eosinophils Absolute 0.2  0.0 - 0.7 (K/uL)   Basophils Relative 0  0 - 1 (%)   Basophils Absolute 0.0  0.0 - 0.1 (K/uL)         1. Abdominal pain   2. Gastritis       MDM  Patient seen and evaluated. Patient no acute distress.  Patient feeling much better after medications. Patient has a soft abdomen on reexam. No peritoneal signs. I did discuss with patient and family options for additional evaluation but this time patient is very comfortable without any concerning lab findings and patient wishes to return home.  Patient given strict return precautions.      Angus Seller, [place] 03/08/12 2226

## 2012-03-07 NOTE — Discharge Instructions (Signed)
You were seen and evaluated for your symptoms of upper abdominal pain and nausea. A blood test today did not show any concerning signs for emergent condition causing your symptoms. There is no signs for elevated blood sugar. Your urine test did not show any signs for a urinary tract infection. Your pregnancy test was also negative today. At this time your providers feel your symptoms may be caused from heartburn and indigestion. It is recommended to use antacid medications regularly to help with your symptoms. Return to emergency room for any severe worsening sprain, fever, chills, persistent nausea vomiting.    Gastritis Gastritis is an inflammation (the body's way of reacting to injury and/or infection) of the stomach. It is often caused by viral or bacterial (germ) infections. It can also be caused by chemicals (including alcohol) and medications. This illness may be associated with generalized malaise (feeling tired, not well), cramps, and fever. The illness may last 2 to 7 days. If symptoms of gastritis continue, gastroscopy (looking into the stomach with a telescope-like instrument), biopsy (taking tissue samples), and/or blood tests may be necessary to determine the cause. Antibiotics will not affect the illness unless there is a bacterial infection present. One common bacterial cause of gastritis is an organism known as H. Pylori. This can be treated with antibiotics. Other forms of gastritis are caused by too much acid in the stomach. They can be treated with medications such as H2 blockers and antacids. Home treatment is usually all that is needed. Young children will quickly become dehydrated (loss of body fluids) if vomiting and diarrhea are both present. Medications may be given to control nausea. Medications are usually not given for diarrhea unless especially bothersome. Some medications slow the removal of the virus from the gastrointestinal tract. This slows down the healing process. HOME CARE  INSTRUCTIONS Home care instructions for nausea and vomiting:  For adults: drink small amounts of fluids often. Drink at least 2 quarts a day. Take sips frequently. Do not drink large amounts of fluid at one time. This may worsen the nausea.   Only take over-the-counter or prescription medicines for pain, discomfort, or fever as directed by your caregiver.   Drink clear liquids only. Those are anything you can see through such as water, broth, or soft drinks.   Once you are keeping clear liquids down, you may start full liquids, soups, juices, and ice cream or sherbet. Slowly add bland (plain, not spicy) foods to your diet.  Home care instructions for diarrhea:  Diarrhea can be caused by bacterial infections or a virus. Your condition should improve with time, rest, fluids, and/or anti-diarrheal medication.   Until your diarrhea is under control, you should drink clear liquids often in small amounts. Clear liquids include: water, broth, jell-o water and weak tea.  Avoid:  Milk.   Fruits.   Tobacco.   Alcohol.   Extremely hot or cold fluids.   Too much intake of anything at one time.  When your diarrhea stops you may add the following foods, which help the stool to become more formed:  Rice.   Bananas.   Apples without skin.   Dry toast.  Once these foods are tolerated you may add low-fat yogurt and low-fat cottage cheese. They will help to restore the normal bacterial balance in your bowel. Wash your hands well to avoid spreading bacteria (germ) or virus. SEEK IMMEDIATE MEDICAL CARE IF:   You are unable to keep fluids down.   Vomiting or diarrhea become persistent (  constant).   Abdominal pain develops, increases, or localizes. (Right sided pain can be appendicitis. Left sided pain in adults can be diverticulitis.)   You develop a fever (an oral temperature above 102 F (38.9 C)).   Diarrhea becomes excessive or contains blood or mucus.   You have excessive weakness,  dizziness, fainting or extreme thirst.   You are not improving or you are getting worse.   You have any other questions or concerns.  Document Released: 09/23/2001 Document Revised: 09/18/2011 Document Reviewed: 09/29/2005 [facility] Patient Information 2012 [place], [place].    RESOURCE GUIDE  Dental Problems  Patients with Medicaid: [facility] [phone] [address]                                           [phone] [address] Phone:  [phone]                                                  Phone:  [phone]  If unable to pay or uninsured, contact:  [facility] or [facility]. to become qualified for the adult dental clinic.  Chronic Pain Problems Contact [facility]  [phone] Patients need to be referred by their primary care doctor.  Insufficient Money for Medicine Contact [employer]:  call "211" or [facility] [phone].  No Primary Care Doctor Call [facility]  9085286998 Other agencies that provide inexpensive medical care    [facility]  [phone]    [facility]  [phone]    [facility]  [phone]    [facility]  [phone]    [facility]  [phone]    [facility]  [phone]  Psychological Services [facility] Behavioral Health  [phone] [facility] Services  [phone] [facility]   [phone] (emergency services [phone])  Substance Abuse Resources [facility]  [phone] [facility] [phone] [facility] [place] [phone] Floydene Flock [phone] Residential & Outpatient Substance Abuse Program  [phone]  Abuse/Neglect [facility] Child Abuse Hotline (901)563-8097 [facility] Child Abuse Hotline 339-386-7278 (After Hours)  Emergency Shelter [facility] 252-612-6150  Maternity Homes [facility] [place] [facility] 252-227-8631 [facility] [phone]  MRSA Hotline #:   336-491-0779    [facility] Resources  [facility]     [employer]                          [facility] Dept. [address] [place]                       [address]      [address]  [address]  Sela Hua Phone:  [phone]                                   Phone:  [phone]                 Phone:  [place] Phone:  [place] [phone] (After Hours)

## 2012-03-07 NOTE — ED Notes (Signed)
Rx given x2 D/c instructions reviewed w/ pt and family - pt and family deny any further questions or concerns at present. Pt ambulating independently w/ steady gait on d/c in no acute distress, A&Ox4.  

## 2012-03-15 NOTE — ED Provider Notes (Signed)
Medical screening examination/treatment/procedure(s) were performed by non-physician practitioner and as supervising physician I was immediately available for consultation/collaboration.  Gerhard Munch, MD 03/15/12 [phone]

## 2012-03-17 ENCOUNTER — Ambulatory Visit (INDEPENDENT_AMBULATORY_CARE_PROVIDER_SITE_OTHER): Payer: PRIVATE HEALTH INSURANCE | Admitting: Medical

## 2012-03-17 ENCOUNTER — Encounter: Payer: Self-pay | Admitting: Medical

## 2012-03-17 VITALS — BP 110/80 | HR 66 | Temp 98.1°F | Resp 16 | Ht 60.0 in | Wt 176.0 lb

## 2012-03-17 DIAGNOSIS — R51 Headache: Secondary | ICD-10-CM

## 2012-03-17 DIAGNOSIS — R519 Headache, unspecified: Secondary | ICD-10-CM

## 2012-03-17 DIAGNOSIS — F430 Acute stress reaction: Secondary | ICD-10-CM

## 2012-03-17 DIAGNOSIS — R3129 Other microscopic hematuria: Secondary | ICD-10-CM

## 2012-03-17 DIAGNOSIS — F438 Other reactions to severe stress: Secondary | ICD-10-CM

## 2012-03-17 DIAGNOSIS — R0789 Other chest pain: Secondary | ICD-10-CM

## 2012-03-17 DIAGNOSIS — R109 Unspecified abdominal pain: Secondary | ICD-10-CM

## 2012-03-17 DIAGNOSIS — Z0000 Encounter for general adult medical examination without abnormal findings: Secondary | ICD-10-CM

## 2012-03-17 DIAGNOSIS — Z113 Encounter for screening for infections with a predominantly sexual mode of transmission: Secondary | ICD-10-CM

## 2012-03-17 LAB — LIPID PANEL
Cholesterol: 203 mg/dL — ABNORMAL HIGH (ref 0–200)
HDL: 47 mg/dL (ref >39)
LDL Cholesterol: 145 mg/dL — ABNORMAL HIGH (ref 0–99)
Total CHOL/HDL Ratio: 4.3 Ratio
Triglycerides: 54 mg/dL (ref <150)
VLDL: 11 mg/dL (ref 0–40)

## 2012-03-17 LAB — TSH: TSH: 1.323 u[IU]/mL (ref 0.350–4.500)

## 2012-03-17 MED ORDER — FLUCONAZOLE 150 MG PO TABS: 150.0000 mg | ORAL_TABLET | Freq: Once | ORAL | Status: AC

## 2012-03-17 NOTE — Progress Notes (Addendum)
Subjective:   HPI  Katelyn Mcfarland is a 21 y.o. female who presents for a complete physical.  Here as a new patient today.  Has not had routine primary care.  Last preventative care was through the health dept.   She has numerous concerns.   She reports a few month hx/o left eye blurriness.  No prior eye doctor visits.    She notes memory issues at times.    She reports joint pains in hips, hands, and ankles.  No morning stiffness.  No joint swelling.   She c/o abdominal pain.  Went to the emergency dept recently for the same.   She notes several month hx/o abdominal pain, worse in the last month, happens every few days, has 10/10 pain at times, stabbing pains.  Not affected either way by food.  She notes RLQ pain.  She notes BM daily.  Last few days though has had some constipation.  Pain does not radiate. Occasional nausea, but no vomiting or diarrhea.    Uses ibuprofen for pain.  Pain is worse with stress.  She c/o occasional tightening in the chest.  Has had this since a child, lasts for up to a minute.  Denies hx/o asthma.   She does note lots of recent stress.  States she gets stressed about everything.  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   She reports chronic headaches - daily, left temporal, starts in neck, worse with stress.  Uses Advil or ibuprofen  She notes distant family hx/o cancer.  She reports that she wants to be screened for all types of cancers.  Last pap a year ago through health dept.  No family hx/o cancer under 50yo.  No first degrees relatives with cancer.    She notes urinary frequency, lots of yeast infections in the past.  She does use various hygiene products, none consistent.   Preventative care: Last dental visit: last year Last tetanus booster: UTD, in college Last pap smear last year? She thinks it was normal Last STD screening 2 weeks ago  Reviewed their medical, surgical, family, social, medication, and allergy  history and updated chart as appropriate.  Past Medical History  Diagnosis Date  . High cholesterol   . Chlamydia     age 75yo; hx/o trichomonas age 59yo  . Chronic headache   . Depression      Past Surgical History  Procedure Date  . Wisdom tooth extraction     Family History  Problem Relation Age of Onset  . Hypertension Mother   . Diabetes Mother   . Asthma Sister   . Cancer Paternal Grandmother   . Cancer Paternal Grandfather   . Heart disease Neg Hx   . Stroke Neg Hx     History   Social History  . Marital Status: Single    Spouse Name: N/A    Number of Children: N/A  . Years of Education: N/A   Occupational History  . student [employer]   Social History Main Topics  . Smoking status: Former Smoker -- 1.0 packs/day for 12 years  . Smokeless tobacco: Not on file   Comment: quit x 3 wk as of 03/2012  . Alcohol Use: Yes  . Drug Use: No  . Sexually Active: Yes    Birth Control/ Protection: Implant   Other Topics Concern  . Not on file   Social History Narrative   Single, 2 children,  exercise - none    Current Outpatient Prescriptions on File Prior to Visit  Medication Sig Dispense Refill  . famotidine (PEPCID) 20 MG tablet Take 1 tablet (20 mg total) by mouth 2 (two) times daily.  30 tablet  0  . ibuprofen (ADVIL,MOTRIN) 200 MG tablet Take 600 mg by mouth every 6 (six) hours as needed. For pain      . omeprazole (PRILOSEC) 20 MG capsule Take 1 capsule (20 mg total) by mouth daily.  30 capsule  0    No Known Allergies  Review of Systems Constitutional: -fever, -chills, -sweats, -unexpected weight change, -anorexia, -fatigue Allergy: -sneezing, -itching, -congestion Dermatology: denies changing moles, rash, lumps, new worrisome lesions ENT: -runny nose, -ear pain, +sore throat, -hoarseness, -sinus pain, +teeth pain, -tinnitus, -hearing loss, -epistaxis Cardiology:  +chest pain, -palpitations, -edema, -orthopnea, -paroxysmal nocturnal  dyspnea Respiratory: -cough, -shortness of breath, -dyspnea on exertion, -wheezing, -hemoptysis Gastroenterology: +abdominal pain, -nausea, -vomiting, -diarrhea, -constipation, -blood in stool, +changes in bowel movement, -dysphagia Hematology: -bleeding or bruising problems Musculoskeletal: -arthralgias, +myalgias, -joint swelling, -back pain, -neck pain, -cramping, -gait changes Ophthalmology: +vision changes, -eye redness, -itching, -discharge Urology: -dysuria, -difficulty urinating, -hematuria, -urinary frequency, -urgency, incontinence Neurology: +headache, -weakness, -tingling, -numbness, -speech abnormality, -memory loss, -falls, -dizziness Psychology:  -depressed mood, -agitation, +sleep problems     Objective:   Physical Exam  Filed Vitals:   03/17/12 1430  BP: 110/80  Pulse: 66  Temp: 98.1 F (36.7 C)  Resp: 16    General appearance: alert, no distress, WD/WN, overweight AA female Skin: unremarkable HEENT: normocephalic, conjunctiva/corneas normal, sclerae anicteric, PERRLA, EOMi, nares patent, no discharge or erythema, pharynx normal Oral cavity: MMM, tongue with piercing, teeth in good repair Neck: supple, no lymphadenopathy, no thyromegaly, no masses, normal ROM, no bruits Chest: non tender, normal shape and expansion, large breasted Heart: RRR, normal S1, S2, no murmurs Lungs: CTA bilaterally, no wheezes, rhonchi, or rales Abdomen: +bs, soft, mild to moderate RLQ tenderness, otherwise non tender, non distended, no masses, no hepatomegaly, no splenomegaly, no bruits Back: non tender, normal ROM, no scoliosis Musculoskeletal: upper extremities non tender, no obvious deformity, normal ROM throughout, lower extremities non tender, no obvious deformity, normal ROM throughout Extremities: no edema, no cyanosis, no clubbing Pulses: 2+ symmetric, upper and lower extremities, normal cap refill Neurological: alert, oriented x 3, CN2-12 intact, strength normal upper  extremities and lower extremities, sensation normal throughout, DTRs 2+ throughout, no cerebellar signs, gait normal Psychiatric: normal affect, behavior normal, pleasant  Breast: nontender, no masses or lumps, no skin changes, no nipple discharge or inversion, no axillary lymphadenopathy Gyn: Normal external genitalia without lesions, clitoral piercing, vagina with normal mucosa, cervix without lesions, no cervical motion tenderness, mild white cheesy abnormal vaginal discharge.  Uterus and adnexa not enlarged, nontender, no masses.  Swabs taken.  Exam chaperoned by nurse. Rectal: anus normal, occult negative stool    Assessment and Plan :    Encounter Diagnoses  Name Primary?  . Routine general medical examination at a health care facility Yes  . Abdominal pain   . Chest tightness   . Chronic headache   . Acute stress reaction   . Screen for STD (sexually transmitted disease)   . Microscopic hematuria    Physical exam - discussed healthy lifestyle, diet, exercise, preventative care, vaccinations, and addressed their concerns.  Handout given.  Discussed appropriate cancer screening.  discussed her numerous concerns.  Some we will have to address further at future visit and pending labs  Abdominal pain - reviewed recent emergency dept note from 03/07/12 including labs - CMET, CBC, and lipase that were unremarkable.  She had negative Upreg, microscopic hematuria.  Was given H2 blocker and advised to f/u with primary care if pain continued.  STD screening today.   If normal, consider abdominal and pelvis ultrasound. Wet prep today with yeast, otherwise negative.  Chest tightness - likely related to anxiety.   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  CXR at her request given her smoking history and tightness.  Chronic headache - hx/o suggest tension headaches.  Advised stress reduction, counseling, and recheck pending labs.   Acute stress reaction - advised  counseling.  STD screening today  Microscopic hematuria - repeat first morning urinalysis in 2 wk.  Urine culture sent.   Follow-up pending labs

## 2012-03-18 ENCOUNTER — Other Ambulatory Visit: Payer: Self-pay | Admitting: Medical

## 2012-03-18 DIAGNOSIS — R102 Pelvic and perineal pain: Secondary | ICD-10-CM

## 2012-03-18 DIAGNOSIS — R3129 Other microscopic hematuria: Secondary | ICD-10-CM

## 2012-03-18 DIAGNOSIS — R109 Unspecified abdominal pain: Secondary | ICD-10-CM

## 2012-03-18 LAB — GC/CHLAMYDIA PROBE AMP, GENITAL
Chlamydia, DNA Probe: NEGATIVE
GC Probe Amp, Genital: NEGATIVE

## 2012-03-18 LAB — HIV ANTIBODY (ROUTINE TESTING W REFLEX): HIV: NONREACTIVE

## 2012-03-18 LAB — RPR

## 2012-03-19 ENCOUNTER — Ambulatory Visit
Admission: RE | Admit: 2012-03-19 | Discharge: 2012-03-19 | Disposition: A | Discharge status: Home / Self Care | Payer: PRIVATE HEALTH INSURANCE | Source: Ambulatory Visit | Attending: Medical | Admitting: Medical

## 2012-03-19 LAB — URINE CULTURE: Colony Count: 80000

## 2012-03-22 ENCOUNTER — Other Ambulatory Visit: Payer: Self-pay | Admitting: Medical

## 2012-03-22 MED ORDER — NITROFURANTOIN MONOHYD MACRO 100 MG PO CAPS: 100.0000 mg | ORAL_CAPSULE | Freq: Two times a day (BID) | ORAL | Status: AC

## 2012-03-25 ENCOUNTER — Inpatient Hospital Stay: Admission: RE | Admit: 2012-03-25 | Payer: PRIVATE HEALTH INSURANCE | Source: Ambulatory Visit

## 2012-04-01 ENCOUNTER — Other Ambulatory Visit: Payer: PRIVATE HEALTH INSURANCE

## 2012-04-01 ENCOUNTER — Ambulatory Visit
Admission: RE | Admit: 2012-04-01 | Discharge: 2012-04-01 | Disposition: A | Discharge status: Home / Self Care | Payer: PRIVATE HEALTH INSURANCE | Source: Ambulatory Visit | Attending: Medical | Admitting: Medical

## 2012-04-01 DIAGNOSIS — R3129 Other microscopic hematuria: Secondary | ICD-10-CM

## 2012-04-01 DIAGNOSIS — R102 Pelvic and perineal pain: Secondary | ICD-10-CM

## 2012-04-01 DIAGNOSIS — R109 Unspecified abdominal pain: Secondary | ICD-10-CM

## 2012-04-02 ENCOUNTER — Ambulatory Visit
Admission: RE | Admit: 2012-04-02 | Discharge: 2012-04-02 | Disposition: A | Discharge status: Home / Self Care | Payer: PRIVATE HEALTH INSURANCE | Source: Ambulatory Visit | Attending: Medical | Admitting: Medical

## 2012-04-02 ENCOUNTER — Other Ambulatory Visit: Payer: PRIVATE HEALTH INSURANCE

## 2012-04-02 MED ORDER — IOHEXOL 300 MG/ML  SOLN
100.0000 mL | Freq: Once | INTRAMUSCULAR | Status: AC | PRN
  Administered 2012-04-02: 100 mL via INTRAVENOUS

## 2012-04-19 ENCOUNTER — Other Ambulatory Visit (INDEPENDENT_AMBULATORY_CARE_PROVIDER_SITE_OTHER): Payer: PRIVATE HEALTH INSURANCE

## 2012-04-19 DIAGNOSIS — R82998 Other abnormal findings in urine: Secondary | ICD-10-CM

## 2012-04-19 DIAGNOSIS — R8290 Unspecified abnormal findings in urine: Secondary | ICD-10-CM

## 2012-04-19 LAB — POCT URINALYSIS DIPSTICK
Bilirubin, UA: NEGATIVE
Blood, UA: 50
Ketones, UA: NEGATIVE
Leukocytes, UA: NEGATIVE
Nitrite, UA: NEGATIVE
Spec Grav, UA: 1.02
Urobilinogen, UA: NEGATIVE
pH, UA: 5

## 2012-05-20 ENCOUNTER — Other Ambulatory Visit (INDEPENDENT_AMBULATORY_CARE_PROVIDER_SITE_OTHER): Payer: PRIVATE HEALTH INSURANCE

## 2012-05-20 DIAGNOSIS — N390 Urinary tract infection, site not specified: Secondary | ICD-10-CM

## 2012-05-20 LAB — POCT URINALYSIS DIPSTICK
Bilirubin, UA: NEGATIVE
Blood, UA: 250
Glucose, UA: NEGATIVE
Ketones, UA: NEGATIVE
Nitrite, UA: NEGATIVE
Spec Grav, UA: 1.02
Urobilinogen, UA: NEGATIVE
pH, UA: 5

## 2012-05-20 NOTE — Progress Notes (Unsigned)
Pt came in for a repeat UA the UA showed trace of leuk and prot. Along with 250 blood showed to Dr.Lalonde and have put it in to spin Dr.Lalonde will advise me on what to do

## 2012-05-24 ENCOUNTER — Ambulatory Visit: Payer: PRIVATE HEALTH INSURANCE | Admitting: Medical

## 2012-05-24 ENCOUNTER — Other Ambulatory Visit: Payer: PRIVATE HEALTH INSURANCE

## 2012-05-24 DIAGNOSIS — R8290 Unspecified abnormal findings in urine: Secondary | ICD-10-CM

## 2012-05-26 LAB — URINE CULTURE: Colony Count: >=100000

## 2012-05-28 ENCOUNTER — Ambulatory Visit: Payer: PRIVATE HEALTH INSURANCE | Admitting: Medical

## 2012-06-15 ENCOUNTER — Other Ambulatory Visit: Payer: Self-pay | Admitting: Medical

## 2012-06-15 ENCOUNTER — Telehealth: Payer: Self-pay | Admitting: Family Medicine

## 2012-06-15 MED ORDER — CIPROFLOXACIN HCL 250 MG PO TABS: 250.0000 mg | ORAL_TABLET | Freq: Two times a day (BID) | ORAL | Status: AC

## 2012-06-15 NOTE — Telephone Encounter (Signed)
Patient states that she has had so much going on in her life that she forgot to go by the pharmacy and pick up the Cipro medication. She states that she called the pharmacy and they said that they didn't have any RX there for her. Can you please resend the RX in for Cipro. CLS

## 2012-06-29 ENCOUNTER — Encounter: Payer: Self-pay | Admitting: Family Medicine

## 2012-06-29 ENCOUNTER — Ambulatory Visit (INDEPENDENT_AMBULATORY_CARE_PROVIDER_SITE_OTHER): Payer: Medicaid Other | Admitting: Family Medicine

## 2012-06-29 VITALS — BP 118/80 | HR 87 | Wt 180.0 lb

## 2012-06-29 DIAGNOSIS — R319 Hematuria, unspecified: Secondary | ICD-10-CM

## 2012-06-29 LAB — POCT URINALYSIS DIPSTICK
Bilirubin, UA: NEGATIVE
Blood, UA: POSITIVE
Glucose, UA: NEGATIVE
Ketones, UA: NEGATIVE
Leukocytes, UA: NEGATIVE
Nitrite, UA: NEGATIVE
Protein, UA: NEGATIVE
Spec Grav, UA: <=1.005
Urobilinogen, UA: NEGATIVE
pH, UA: 7

## 2012-06-29 NOTE — Patient Instructions (Signed)
Keep drinking plenty of fluids and you can use Azo-Standard and see what that'll do to help the pain

## 2012-06-29 NOTE — Progress Notes (Signed)
  Subjective:    Patient ID: Katelyn Mcfarland, female    DOB: 10/08/1991, 21 y.o.   MRN: [id number]  HPI Se is here for recheck. She was seen in June and CT scan was done for evaluation of hematuria. It was negative. She has been getting urinalysis done on several occasions. All of them did show hematuria. She was recently treated with Cipro. She is here for followup. She is having some slight abdominal pain but no dysuria, urgency, fever or chills.  Review of Systems     Objective:   Physical Exam Alert and in no distress. Lower abdominal exam shows no tenderness to palpation. Microscopic did show contamination and scattered red cells.       Assessment & Plan:   1. Hematuria  CULTURE, URINE COMPREHENSIVE   if the culture is positive, we will treat appropriately and then get her back for followup urinalysis. If the culture is negative, referral to urology is probably warranted.

## 2012-06-29 NOTE — Addendum Note (Signed)
Addended by: Lavell Islam on: 06/29/2012 11:07 AM   Modules accepted: Orders

## 2012-07-03 LAB — CULTURE, URINE COMPREHENSIVE: Colony Count: 15000

## 2012-07-05 ENCOUNTER — Telehealth: Payer: Self-pay | Admitting: Family Medicine

## 2012-07-05 NOTE — Telephone Encounter (Signed)
I FAX OVER OV NOTES, DEMOGRAPHICS AND LABS AND INSURANCE CARD OVER TO [facility]. CLS

## 2012-07-05 NOTE — Telephone Encounter (Signed)
Referral to Urology for persistent microscopic hematuria

## 2012-07-07 ENCOUNTER — Telehealth: Payer: Self-pay | Admitting: Family Medicine

## 2012-07-07 NOTE — Progress Notes (Signed)
Phoned pt advised lab results.

## 2012-07-07 NOTE — Telephone Encounter (Signed)
PATIENT IS AWARE OF HER APPOINTMENT AT [facility] ON 07/29/12 @ 315 PM WITH DR. Isabel [email or web address].[email or web address]  [phone]  I FAX OVER DEMOGRAPHICS, OV NOTES, LABS AND INSURANCE CARD. CLS

## 2012-09-01 ENCOUNTER — Encounter: Payer: Self-pay | Admitting: Medical

## 2012-09-01 ENCOUNTER — Ambulatory Visit (INDEPENDENT_AMBULATORY_CARE_PROVIDER_SITE_OTHER): Payer: BC Managed Care – PPO | Admitting: Medical

## 2012-09-01 VITALS — BP 110/70 | HR 88 | Temp 98.5°F | Resp 16 | Wt 184.0 lb

## 2012-09-01 DIAGNOSIS — Z2089 Contact with and (suspected) exposure to other communicable diseases: Secondary | ICD-10-CM

## 2012-09-01 DIAGNOSIS — N949 Unspecified condition associated with female genital organs and menstrual cycle: Secondary | ICD-10-CM

## 2012-09-01 DIAGNOSIS — Z202 Contact with and (suspected) exposure to infections with a predominantly sexual mode of transmission: Secondary | ICD-10-CM

## 2012-09-01 DIAGNOSIS — Z23 Encounter for immunization: Secondary | ICD-10-CM

## 2012-09-01 DIAGNOSIS — N898 Other specified noninflammatory disorders of vagina: Secondary | ICD-10-CM

## 2012-09-01 MED ORDER — FLUCONAZOLE 150 MG PO TABS: ORAL_TABLET | ORAL | Status: DC

## 2012-09-01 NOTE — Progress Notes (Signed)
Subjective: Here for STD check.   I last saw her in 03/2012.  She has had a new sexual partner since that visit who she just found out was cheating on her.   She wants STD screening.  She has not been using condoms with current partner.  She notes some intermittent vaginal odor.  No pelvic pain, no urinary changes no discharge.  Wants checked out.    Past Medical History  Diagnosis Date  . High cholesterol   . Chlamydia     age 21yo; hx/o trichomonas age 52yo  . Chronic headache   . Depression    ROS as in HPI  Objective: Gen: wd, wn SKin: no rash or lesions Abdomen: nontender, no organomegaly or mass Gyn: Normal external genitalia without lesions, clitoral piercing present, vagina with normal mucosa, cervix without lesions, white thick abnormal vaginal discharge.  Uterus and adnexa not enlarged, nontender, no masses.  Exam chaperoned by nurse.  Assessment: Encounter Diagnoses  Name Primary?  . Venereal disease contact Yes  . Vaginal odor   . Need for HPV vaccination    Plan: STD screening today.  discussed condoms, safe sex, prevention  KOH with + yeast.  We will treat for yeast infection.  Diflucan sent to pharmacy.  HPV vaccine counseling, VIS and vaccine given .  Recheck 35mo for HPV #2.

## 2012-09-02 ENCOUNTER — Encounter: Payer: Self-pay | Admitting: Internal Medicine

## 2012-09-02 LAB — GC/CHLAMYDIA PROBE AMP, GENITAL
Chlamydia, DNA Probe: NEGATIVE
GC Probe Amp, Genital: NEGATIVE

## 2012-09-02 LAB — HIV ANTIBODY (ROUTINE TESTING W REFLEX): HIV: NONREACTIVE

## 2012-09-02 LAB — RPR

## 2012-10-14 ENCOUNTER — Encounter: Payer: Self-pay | Admitting: Family Medicine

## 2012-10-14 ENCOUNTER — Ambulatory Visit (INDEPENDENT_AMBULATORY_CARE_PROVIDER_SITE_OTHER): Payer: Medicaid Other | Admitting: Family Medicine

## 2012-10-14 VITALS — BP 118/70 | HR 82 | Wt 187.0 lb

## 2012-10-14 DIAGNOSIS — M542 Cervicalgia: Secondary | ICD-10-CM

## 2012-10-14 NOTE — Progress Notes (Signed)
  Subjective:    Patient ID: Katelyn Mcfarland, female    DOB: 12-Feb-1991, 22 y.o.   MRN: [id number]  HPI She is here for evaluation of a one-month history of right-sided neck and shoulder pain that is intermittent in nature. It does tend to bother her more at night. She is now noting the pain going down her arm into her fingers mainly second and third she cannot relate this to any head or shoulder position. She's noticing fine motor weakness she also feels numbness in the third and fourth   Review of Systems     Objective:   Physical Exam And in no distress. Full motion of her neck. No tenderness palpation of the neck or upper arm or shoulder area. Normal motor, sensory and DTRs.       Assessment & Plan:   1. Neck pain on right side    discussed her symptoms with her in detail. Recommend supportive care with heat, stretching and ibuprofen. She will return here if continued difficulty.

## 2012-10-14 NOTE — Patient Instructions (Signed)
Heat to your neck and shoulder for 20 minutes 3 times per day. Ibuprofen 4 tablets 3 times per day. Do this for the next 10 days to 2 weeks. Gentle stretching after the heat.

## 2012-10-28 ENCOUNTER — Encounter: Payer: Self-pay | Admitting: Family Medicine

## 2012-10-28 ENCOUNTER — Ambulatory Visit
Admission: RE | Admit: 2012-10-28 | Discharge: 2012-10-28 | Disposition: A | Discharge status: Home / Self Care | Payer: Medicaid Other | Source: Ambulatory Visit | Attending: Family Medicine | Admitting: Family Medicine

## 2012-10-28 ENCOUNTER — Ambulatory Visit (INDEPENDENT_AMBULATORY_CARE_PROVIDER_SITE_OTHER): Payer: Medicaid Other | Admitting: Family Medicine

## 2012-10-28 VITALS — BP 128/70 | HR 104 | Wt 187.0 lb

## 2012-10-28 DIAGNOSIS — Z23 Encounter for immunization: Secondary | ICD-10-CM

## 2012-10-28 DIAGNOSIS — M542 Cervicalgia: Secondary | ICD-10-CM

## 2012-10-28 NOTE — Progress Notes (Signed)
  Subjective:    Patient ID: Katelyn Mcfarland, female    DOB: 1991-08-12, 22 y.o.   MRN: [id number]  HPI She is here for reevaluation. She states that the pain continues. It starts on the right side of her neck and proceeds down her arm into the urgent fourth fingers. It can last all night and usually occurs at night. She would also like a flu shot and another Gardasil shot.  Review of Systems     Objective:   Physical Exam Alert and in no distress. Full motion of the neck. No tenderness to palpation. Normal motor, sensory and DTRs of her arms. C-spine x-ray shows no changes.       Assessment & Plan:   1. Immunization due  HPV vaccine quadravalent 3 dose IM  2. Need for prophylactic vaccination and inoculation against influenza  Flu vaccine greater than or equal to 3yo preservative free IM  3. Neck pain on right side  DG Cervical Spine Complete, DG Cervical Spine 2 or 3 views   I will refer her to physical therapy and if no improvement, reevaluate.

## 2012-10-29 ENCOUNTER — Other Ambulatory Visit: Payer: Self-pay

## 2012-10-29 DIAGNOSIS — M25521 Pain in right elbow: Secondary | ICD-10-CM

## 2012-10-29 NOTE — Progress Notes (Signed)
Quick Note:  CALLED PT CELL/HOME # LEFT MESSAGE THAT XRAY WAS NEG AND I HAVE SENT REFERRAL TO [facility]. TRY IT AND IF IT DOESN'T WORK CONTACT us FOR FURTHER EVAL. ______

## 2012-11-03 ENCOUNTER — Telehealth: Payer: Self-pay | Admitting: Family Medicine

## 2012-11-05 NOTE — Telephone Encounter (Signed)
Left message that it was not [name] that had called her it was [facility] on [address] that had called for her to make her apt to please give them a call

## 2012-11-11 ENCOUNTER — Ambulatory Visit: Payer: BC Managed Care – PPO | Attending: Family Medicine | Admitting: Physical Therapy

## 2012-11-11 DIAGNOSIS — IMO0001 Reserved for inherently not codable concepts without codable children: Secondary | ICD-10-CM | POA: Insufficient documentation

## 2012-11-11 DIAGNOSIS — M25519 Pain in unspecified shoulder: Secondary | ICD-10-CM | POA: Insufficient documentation

## 2012-11-11 DIAGNOSIS — M542 Cervicalgia: Secondary | ICD-10-CM | POA: Insufficient documentation

## 2012-11-18 ENCOUNTER — Ambulatory Visit: Payer: BC Managed Care – PPO | Attending: Family Medicine | Admitting: Physical Therapy

## 2012-11-18 DIAGNOSIS — M542 Cervicalgia: Secondary | ICD-10-CM | POA: Insufficient documentation

## 2012-11-18 DIAGNOSIS — M25519 Pain in unspecified shoulder: Secondary | ICD-10-CM | POA: Insufficient documentation

## 2012-11-18 DIAGNOSIS — IMO0001 Reserved for inherently not codable concepts without codable children: Secondary | ICD-10-CM | POA: Insufficient documentation

## 2012-11-25 ENCOUNTER — Ambulatory Visit: Payer: BC Managed Care – PPO | Admitting: Physical Therapy

## 2012-12-02 ENCOUNTER — Ambulatory Visit: Payer: BC Managed Care – PPO | Admitting: Physical Therapy

## 2012-12-03 ENCOUNTER — Ambulatory Visit: Payer: Medicaid Other | Admitting: Medical

## 2012-12-08 ENCOUNTER — Ambulatory Visit (INDEPENDENT_AMBULATORY_CARE_PROVIDER_SITE_OTHER): Payer: BC Managed Care – PPO | Admitting: Medical

## 2012-12-08 ENCOUNTER — Encounter: Payer: Self-pay | Admitting: Medical

## 2012-12-08 VITALS — BP 110/60 | HR 88 | Temp 98.2°F | Resp 16 | Wt 185.0 lb

## 2012-12-08 DIAGNOSIS — IMO0002 Reserved for concepts with insufficient information to code with codable children: Secondary | ICD-10-CM

## 2012-12-08 DIAGNOSIS — N760 Acute vaginitis: Secondary | ICD-10-CM

## 2012-12-08 DIAGNOSIS — M542 Cervicalgia: Secondary | ICD-10-CM

## 2012-12-08 DIAGNOSIS — M792 Neuralgia and neuritis, unspecified: Secondary | ICD-10-CM

## 2012-12-08 LAB — POCT WET PREP (WET MOUNT): Clue Cells Wet Prep Whiff POC: NEGATIVE

## 2012-12-08 MED ORDER — IBUPROFEN 600 MG PO TABS: 600.0000 mg | ORAL_TABLET | Freq: Three times a day (TID) | ORAL | Status: DC | PRN

## 2012-12-08 MED ORDER — FLUCONAZOLE 150 MG PO TABS: ORAL_TABLET | ORAL | Status: DC

## 2012-12-08 MED ORDER — CYCLOBENZAPRINE HCL 10 MG PO TABS: ORAL_TABLET | ORAL | Status: DC

## 2012-12-08 NOTE — Progress Notes (Signed)
Subjective Here for c/o possible yeast infection.  She reports currently having odor, not much discharge, funny feeling in stomach.  Trying to prevent yeast infections using cotton underwear, no tight feeling pants.  Taking 2 showers daily to feel clean.  No worse odor with intercourse.  No bleeding.  LMP went off few days ago.  Has 1 sexual partner currently since 06/2012.   No douching.  Not using body wash.   Takes mostly showers.   Last yeast infection somewhat recently.  She thinks more than 4 yeast infections in the past year.  1 or 2 times has had BV. No concern for STD.  Was seen in January by Dr. Susann Givens for neck pain on the right.  Has been seeing PT.  Last appt is tomorrow.  Exercises is causing more pain, pain level is worse.   No improvement.  She reports intense pain .  Has to sit still and wait for pain to past.  Not getting more pains in neck and back than prior.  She does report pain down right arm to elbow and into hands.  She reports numbness in hand in general at times, intermittent.  Gets some tingling in right hand.  No left arm symptoms. No prior similar.  No recent injury, trauma, fall.  Objective: Gen: wd, wn, nad Skin: warm dry Neck: nontender, mild pain with ROM, but ROM seems full,no mass, no thyromegaly or lymphadenopathy Back: nontender MSK: arms nontender, normal ROM, no obvious deformity of UE Neurovascularly intact UE Gyn: Normal external genitalia without lesions, vagina with normal mucosa, cervix without lesions, mild white creamy abnormal vaginal discharge.  Exam chaperoned by nurse.  Wet prep with +yeast, otherwise unremarkable.  Assessment: Encounter Diagnoses  Name Primary?  . Vaginitis and vulvovaginitis Yes  . Cervicalgia   . Radicular pain in right arm    Plan: Vaginitis - looking back in chart only 2 documented yeast infections in past 69mo, 2 documented UTIs, 1 documented BV case, no obvious diabetes and screening labs.  Discussed hygiene, means of  prevention, OTC and prescription medications for yeast.  Begin diflucan for this occurrence.  Call/return if not improving.   Cervicalgia, radicular pain - reviewed C spine xray.   She has not had improvement with PT.  Begin short course of NSAID and muscle relaxer QHS.  C/t home exercises and stretching daily, heat pad prn.  If no improvement in a few weeks, recheck, consider ortho referral.

## 2012-12-08 NOTE — Patient Instructions (Signed)
Monilial Vaginitis Vaginitis in a soreness, swelling and redness (inflammation) of the vagina and vulva. Monilial vaginitis is not a sexually transmitted infection. CAUSES  Yeast vaginitis is caused by yeast (candida) that is normally found in your vagina. With a yeast infection, the candida has overgrown in number to a point that upsets the chemical balance. SYMPTOMS   White, thick vaginal discharge.  Swelling, itching, redness and irritation of the vagina and possibly the lips of the vagina (vulva).  Burning or painful urination.  Painful intercourse. DIAGNOSIS  Things that may contribute to monilial vaginitis are:  Postmenopausal and virginal states.  Pregnancy.  Infections.  Being tired, sick or stressed, especially if you had monilial vaginitis in the past.  Diabetes. Good control will help lower the chance.  Birth control pills.  Tight fitting garments.  Using bubble bath, feminine sprays, douches or deodorant tampons.  Taking certain medications that kill germs (antibiotics).  Sporadic recurrence can occur if you become ill. TREATMENT  Your caregiver will give you medication.  There are several kinds of anti monilial vaginal creams and suppositories specific for monilial vaginitis. For recurrent yeast infections, use a suppository or cream in the vagina 2 times a week, or as directed.  Anti-monilial or steroid cream for the itching or irritation of the vulva may also be used. Get your caregiver's permission.  Painting the vagina with methylene blue solution may help if the monilial cream does not work.  Eating yogurt may help prevent monilial vaginitis. HOME CARE INSTRUCTIONS   Finish all medication as prescribed.  Do not have sex until treatment is completed or after your caregiver tells you it is okay.  Take warm sitz baths.  Do not douche.  Do not use tampons, especially scented ones.  Wear cotton underwear.  Avoid tight pants and panty  hose.  Tell your sexual partner that you have a yeast infection. They should go to their caregiver if they have symptoms such as mild rash or itching.  Your sexual partner should be treated as well if your infection is difficult to eliminate.  Practice safer sex. Use condoms.  Some vaginal medications cause latex condoms to fail. Vaginal medications that harm condoms are:  Cleocin cream.  Butoconazole (Femstat).  Terconazole (Terazol) vaginal suppository.  Miconazole (Monistat) (may be purchased over the counter). SEEK MEDICAL CARE IF:   You have a temperature by mouth above 102 F (38.9 C).  The infection is getting worse after 2 days of treatment.  The infection is not getting better after 3 days of treatment.  You develop blisters in or around your vagina.  You develop vaginal bleeding, and it is not your menstrual period.  You have pain when you urinate.  You develop intestinal problems.  You have pain with sexual intercourse. Document Released: 07/09/2005 Document Revised: 12/22/2011 Document Reviewed: 03/23/2009 ExitCare Patient Information 2013 [employer].  

## 2012-12-09 ENCOUNTER — Ambulatory Visit: Payer: BC Managed Care – PPO | Admitting: Physical Therapy

## 2012-12-22 ENCOUNTER — Encounter: Payer: Self-pay | Admitting: Medical

## 2012-12-22 ENCOUNTER — Ambulatory Visit (INDEPENDENT_AMBULATORY_CARE_PROVIDER_SITE_OTHER): Payer: Medicaid Other | Admitting: Medical

## 2012-12-22 VITALS — BP 110/70 | HR 92 | Temp 98.4°F | Resp 16 | Wt 187.0 lb

## 2012-12-22 DIAGNOSIS — M792 Neuralgia and neuritis, unspecified: Secondary | ICD-10-CM

## 2012-12-22 DIAGNOSIS — IMO0002 Reserved for concepts with insufficient information to code with codable children: Secondary | ICD-10-CM

## 2012-12-22 DIAGNOSIS — M549 Dorsalgia, unspecified: Secondary | ICD-10-CM

## 2012-12-22 DIAGNOSIS — M542 Cervicalgia: Secondary | ICD-10-CM

## 2012-12-22 DIAGNOSIS — N62 Hypertrophy of breast: Secondary | ICD-10-CM

## 2012-12-22 MED ORDER — NAPROXEN 375 MG PO TABS: 375.0000 mg | ORAL_TABLET | Freq: Two times a day (BID) | ORAL | Status: DC

## 2012-12-22 MED ORDER — TRAMADOL HCL 50 MG PO TABS: 50.0000 mg | ORAL_TABLET | Freq: Four times a day (QID) | ORAL | Status: DC | PRN

## 2012-12-22 NOTE — Patient Instructions (Addendum)
Continue a daily stretching routine  Continue daily exercise including walking or aerobics or yoga.  I do recommend weight training at least 2-3 times weekly.   I would recommend light weight, high repetition.   For example, do shoulder presses with a 5 or 10 lb dumbbell, but do 20-30 repetition.    i would continue massage therapy since this has been helpful  Heat pad to the neck/back/shoulders is fine.  I would use ice immediately after exercise if painfully, otherwise use heat.  You can continue Ibuprofen 600mg  2-3 time daily as needed.  Consider topical Capsacian cream OTC to the neck and back.   Begin Naprosyn as needed twice daily for inflammation.   You can also use Ultram pain pill, no more than every 6 hours for severe pain.  These will take the place of Ibuprofen and Flexeril.  I recommend this massage therapist. Sharen Hint [facility] [address] [place] [address] [place], [place] [address] [phone] [email or web address]    As a secondary options - we can consider referral for epidural spinal injection, orthopedic referral, or plastic surgery for breast reduction.

## 2012-12-22 NOTE — Progress Notes (Signed)
Subjective: Here for recheck on neck and arm pain.   Pain is a little better than at last visit.  Still has right arm pain, upper back pain and right neck pain.   occasionally has left neck pain.  Since last visit has used heat pad on and off.  Using muscle relaxer nightly to help, but 1/2 tablet doesn't help, and whole table makes her sleepy until lunch time the next day.   Using Ibuprofen 600mg  2-3 times daily.   Using home therapy exercises as per PT instructions.   Overall only mildly better.  Massage therapy has been helpful.  Still has severe pain on many days.  The worse pain is still neck and shoulder on the right.   Objective: Gen: wd, wn, nad  Skin: warm dry  Neck: nontender, mild pain with ROM, but ROM seems full,no mass, no thyromegaly or lymphadenopathy  Back: nontender  MSK: arms nontender, normal ROM, no obvious deformity of UE  Neurovascularly intact UE    Assessment:  Encounter Diagnoses  Name Primary?  . Cervicalgia Yes  . Back pain   . Radicular pain in right arm   . Large breasts     Plan:  Etiology still seems musculoskeletal instead of a neurological issue such as bulging disc or other.  Stop flexeril and Ibuprofen.  Begin Naprosyn instead, can use Ultram for breakthrough pain.  advised daily stretching routine including neck, shoulder, and arm ROM and stretching, advised low weight, high rep weight training and demonstrated several exercise to do, can consider massage on a regular basis, can use heat pad, can use topical OTC Capsaicin cream.   If symptoms don't gradually improve in the next 62mo, we can consider ortho referral, but she will also consider other factors such as large breasts, possibility of breast reduction surgery as a last resort for ongoing back and neck pains.

## 2012-12-29 ENCOUNTER — Encounter: Payer: Self-pay | Admitting: Medical

## 2012-12-29 ENCOUNTER — Ambulatory Visit (INDEPENDENT_AMBULATORY_CARE_PROVIDER_SITE_OTHER): Payer: BLUE CROSS/BLUE SHIELD | Admitting: Medical

## 2012-12-29 VITALS — BP 112/80 | HR 88 | Temp 98.2°F | Resp 16 | Wt 186.0 lb

## 2012-12-29 DIAGNOSIS — N390 Urinary tract infection, site not specified: Secondary | ICD-10-CM

## 2012-12-29 DIAGNOSIS — R350 Frequency of micturition: Secondary | ICD-10-CM

## 2012-12-29 DIAGNOSIS — R112 Nausea with vomiting, unspecified: Secondary | ICD-10-CM

## 2012-12-29 LAB — POCT URINALYSIS DIPSTICK
Bilirubin, UA: NEGATIVE
Glucose, UA: NEGATIVE
Ketones, UA: NEGATIVE
Nitrite, UA: NEGATIVE
Spec Grav, UA: 1.02
Urobilinogen, UA: NEGATIVE
pH, UA: 7

## 2012-12-29 MED ORDER — CIPROFLOXACIN HCL 500 MG PO TABS: 500.0000 mg | ORAL_TABLET | Freq: Two times a day (BID) | ORAL | Status: DC

## 2012-12-29 MED ORDER — ONDANSETRON HCL 4 MG PO TABS: 4.0000 mg | ORAL_TABLET | Freq: Three times a day (TID) | ORAL | Status: DC | PRN

## 2012-12-29 NOTE — Progress Notes (Signed)
Subjective:  Katelyn Mcfarland is a 22 y.o. female who complains of frequency, incontinence, nausea, suprapubic pressure, urgency and vomiting. She has had symptoms for 3 days. Patient also complains of decreased appetite. Patient denies back pain, fever and vaginal discharge. Patient does have a history of recurrent UTI, at least 2 UTIs in the past several months. Patient does not have a history of pyelonephritis.   Past Medical History  Diagnosis Date  . High cholesterol   . Chlamydia     age 33yo; hx/o trichomonas age 66yo  . Chronic headache   . Depression    ROS as in subjective   Objective:    Filed Vitals:   12/29/12 1034  BP: 112/80  Pulse: 88  Temp: 98.2 F (36.8 C)  Resp: 16    General appearance: alert, no distress, WD/WN, somewhat ill appearing Abdomen: +bs, soft, non tender, non distended, no masses, no hepatomegaly, no splenomegaly, no bruits Back: no CVA tenderness Pulses: 2+ symmetric     Assessment and Plan:   Encounter Diagnoses  Name Primary?  . UTI (urinary tract infection) Yes  . Frequency of urination   . Nausea with vomiting    Begin Cipro, Zofran for N/V, rest, increase water intake, and call or return in 2 days.  However, if fever over 101, worsening symptoms, uncontrollable pain, NV, then return right away.  Discussed diagnosis of likely UTI vs possible early pyelo.   No fever today, but this seems to be worse than her prior UTIs.   Close f/u, urine sent for culture.   Reviewed prior cultures from this past year.

## 2012-12-29 NOTE — Patient Instructions (Signed)
I am treating you for a moderate urinary tract infection.   Begin Cipro oral antibiotic twice daily.   Begin Zofran 1 tablet every 6 hours as needed for nausea/vomiting.   Rest.  If you haven't vomited in 30 minutes, then hydrate as much as possible to flush the kidneys.   Cal back in 2-3 days to let me know how you are doing.    If you spike a fever over 101, if the nausea/vomiting won't let up, or if you overall are getting much worse, then recheck.   Urinary Tract Infection Urinary tract infections (UTIs) can develop anywhere along your urinary tract. Your urinary tract is your body's drainage system for removing wastes and extra water. Your urinary tract includes two kidneys, two ureters, a bladder, and a urethra. Your kidneys are a pair of bean-shaped organs. Each kidney is about the size of your fist. They are located below your ribs, one on each side of your spine. CAUSES Infections are caused by microbes, which are microscopic organisms, including fungi, viruses, and bacteria. These organisms are so small that they can only be seen through a microscope. Bacteria are the microbes that most commonly cause UTIs. SYMPTOMS  Symptoms of UTIs may vary by age and gender of the patient and by the location of the infection. Symptoms in young women typically include a frequent and intense urge to urinate and a painful, burning feeling in the bladder or urethra during urination. Older women and men are more likely to be tired, shaky, and weak and have muscle aches and abdominal pain. A fever may mean the infection is in your kidneys. Other symptoms of a kidney infection include pain in your back or sides below the ribs, nausea, and vomiting. DIAGNOSIS To diagnose a UTI, your caregiver will ask you about your symptoms. Your caregiver also will ask to provide a urine sample. The urine sample will be tested for bacteria and white blood cells. White blood cells are made by your body to help fight  infection. TREATMENT  Typically, UTIs can be treated with medication. Because most UTIs are caused by a bacterial infection, they usually can be treated with the use of antibiotics. The choice of antibiotic and length of treatment depend on your symptoms and the type of bacteria causing your infection. HOME CARE INSTRUCTIONS  If you were prescribed antibiotics, take them exactly as your caregiver instructs you. Finish the medication even if you feel better after you have only taken some of the medication.  Drink enough water and fluids to keep your urine clear or pale yellow.  Avoid caffeine, tea, and carbonated beverages. They tend to irritate your bladder.  Empty your bladder often. Avoid holding urine for long periods of time.  Empty your bladder before and after sexual intercourse.  After a bowel movement, women should cleanse from front to back. Use each tissue only once. SEEK MEDICAL CARE IF:   You have back pain.  You develop a fever.  Your symptoms do not begin to resolve within 3 days. SEEK IMMEDIATE MEDICAL CARE IF:   You have severe back pain or lower abdominal pain.  You develop chills.  You have nausea or vomiting.  You have continued burning or discomfort with urination. MAKE SURE YOU:   Understand these instructions.  Will watch your condition.  Will get help right away if you are not doing well or get worse. Document Released: 07/09/2005 Document Revised: 03/30/2012 Document Reviewed: 11/07/2011 [facility] Patient Information 2013 [place], [place].

## 2012-12-31 ENCOUNTER — Telehealth: Payer: Self-pay | Admitting: Medical

## 2012-12-31 LAB — URINE CULTURE
Colony Count: NO GROWTH
Organism ID, Bacteria: NO GROWTH

## 2012-12-31 NOTE — Telephone Encounter (Signed)
I called and left a message for the patient. CLS

## 2012-12-31 NOTE — Telephone Encounter (Signed)
Message copied by Janeice Robinson on Fri Dec 31, 2012  4:18 PM ------      Message from: Jac Canavan      Created: Thu Dec 30, 2012  3:11 AM       Call and see how pt is feeling?            In regards to her questions, the Implanon should be good all the way up to 3 years from date of insertion.  Thus, if due out in July, we need to go ahead and set her up for gynecology appt for June.                    ----- Message -----         From: Jac Canavan, PA-C         Sent: 12/29/2012   1:31 PM           To: Jac Canavan, PA-C            Implanon? In july       ------

## 2012-12-31 NOTE — Telephone Encounter (Signed)
Any fever, back pain, is she overall the same or much worse?  If no fever, and not any worse, continue antibiotic and call Monday with update.

## 2013-01-01 ENCOUNTER — Emergency Department (HOSPITAL_COMMUNITY)
Admission: EM | Admit: 2013-01-01 | Discharge: 2013-01-01 | Disposition: A | Discharge status: Home / Self Care | Payer: BC Managed Care – PPO | Attending: Emergency Medicine | Admitting: Emergency Medicine

## 2013-01-01 ENCOUNTER — Encounter (HOSPITAL_COMMUNITY): Payer: Self-pay | Admitting: Adult Health

## 2013-01-01 DIAGNOSIS — R300 Dysuria: Secondary | ICD-10-CM | POA: Insufficient documentation

## 2013-01-01 DIAGNOSIS — Z79899 Other long term (current) drug therapy: Secondary | ICD-10-CM | POA: Insufficient documentation

## 2013-01-01 DIAGNOSIS — Z8619 Personal history of other infectious and parasitic diseases: Secondary | ICD-10-CM | POA: Insufficient documentation

## 2013-01-01 DIAGNOSIS — F17200 Nicotine dependence, unspecified, uncomplicated: Secondary | ICD-10-CM | POA: Insufficient documentation

## 2013-01-01 DIAGNOSIS — R1110 Vomiting, unspecified: Secondary | ICD-10-CM

## 2013-01-01 DIAGNOSIS — Z3202 Encounter for pregnancy test, result negative: Secondary | ICD-10-CM | POA: Insufficient documentation

## 2013-01-01 DIAGNOSIS — E7800 Pure hypercholesterolemia, unspecified: Secondary | ICD-10-CM | POA: Insufficient documentation

## 2013-01-01 DIAGNOSIS — R51 Headache: Secondary | ICD-10-CM | POA: Insufficient documentation

## 2013-01-01 DIAGNOSIS — Z8659 Personal history of other mental and behavioral disorders: Secondary | ICD-10-CM | POA: Insufficient documentation

## 2013-01-01 DIAGNOSIS — R112 Nausea with vomiting, unspecified: Secondary | ICD-10-CM | POA: Insufficient documentation

## 2013-01-01 LAB — COMPREHENSIVE METABOLIC PANEL
ALT: 19 U/L (ref 0–35)
AST: 32 U/L (ref 0–37)
Albumin: 4.1 g/dL (ref 3.5–5.2)
Alkaline Phosphatase: 59 U/L (ref 39–117)
BUN: 10 mg/dL (ref 6–23)
CO2: 28 mEq/L (ref 19–32)
Calcium: 9.4 mg/dL (ref 8.4–10.5)
Chloride: 101 mEq/L (ref 96–112)
Creatinine, Ser: 0.9 mg/dL (ref 0.50–1.10)
GFR calc Af Amer: >90 mL/min (ref >90)
GFR calc non Af Amer: 89 mL/min — ABNORMAL LOW (ref >90)
Glucose, Bld: 97 mg/dL (ref 70–99)
Potassium: 3.7 mEq/L (ref 3.5–5.1)
Sodium: 138 mEq/L (ref 135–145)
Total Bilirubin: 0.2 mg/dL — ABNORMAL LOW (ref 0.3–1.2)
Total Protein: 7.5 g/dL (ref 6.0–8.3)

## 2013-01-01 LAB — URINALYSIS, ROUTINE W REFLEX MICROSCOPIC
Bilirubin Urine: NEGATIVE
Glucose, UA: NEGATIVE mg/dL
Ketones, ur: NEGATIVE mg/dL
Leukocytes, UA: NEGATIVE
Nitrite: NEGATIVE
Protein, ur: NEGATIVE mg/dL
Specific Gravity, Urine: 1.015 (ref 1.005–1.030)
Urobilinogen, UA: 0.2 mg/dL (ref 0.0–1.0)
pH: 6 (ref 5.0–8.0)

## 2013-01-01 LAB — CBC WITH DIFFERENTIAL/PLATELET
Basophils Absolute: 0 10*3/uL (ref 0.0–0.1)
Basophils Relative: 0 % (ref 0–1)
Eosinophils Absolute: 0 10*3/uL (ref 0.0–0.7)
Eosinophils Relative: 1 % (ref 0–5)
HCT: 39.7 % (ref 36.0–46.0)
Hemoglobin: 13.7 g/dL (ref 12.0–15.0)
Lymphocytes Relative: 27 % (ref 12–46)
Lymphs Abs: 2.4 10*3/uL (ref 0.7–4.0)
MCH: 29.6 pg (ref 26.0–34.0)
MCHC: 34.5 g/dL (ref 30.0–36.0)
MCV: 85.7 fL (ref 78.0–100.0)
Monocytes Absolute: 0.4 10*3/uL (ref 0.1–1.0)
Monocytes Relative: 5 % (ref 3–12)
Neutro Abs: 5.8 10*3/uL (ref 1.7–7.7)
Neutrophils Relative %: 67 % (ref 43–77)
Platelets: 199 10*3/uL (ref 150–400)
RBC: 4.63 MIL/uL (ref 3.87–5.11)
RDW: 13.1 % (ref 11.5–15.5)
WBC: 8.6 10*3/uL (ref 4.0–10.5)

## 2013-01-01 LAB — POCT PREGNANCY, URINE: Preg Test, Ur: NEGATIVE

## 2013-01-01 LAB — URINE MICROSCOPIC-ADD ON

## 2013-01-01 MED ORDER — ONDANSETRON 4 MG PO TBDP
8.0000 mg | ORAL_TABLET | Freq: Once | ORAL | Status: AC
  Administered 2013-01-01: 8 mg via ORAL
  Filled 2013-01-01: qty 2

## 2013-01-01 MED ORDER — PROMETHAZINE HCL 25 MG PO TABS: 25.0000 mg | ORAL_TABLET | Freq: Four times a day (QID) | ORAL | Status: DC | PRN

## 2013-01-01 MED ORDER — SODIUM CHLORIDE 0.9 % IV SOLN: INTRAVENOUS | Status: DC

## 2013-01-01 NOTE — ED Notes (Addendum)
Presents with  nausea, vomiting, headache, abdominal pressure for two weeks. Pt was placed on medication for a UTI weds. Pt is tearful. Reports pain with urination and frequency. She also c/o neck pain that has been present for months, described as sharp pain. Given RX for Cipro on Weds.

## 2013-01-01 NOTE — ED Notes (Signed)
Pt comfortable with dc instructions, given 1 prescription

## 2013-01-01 NOTE — ED Notes (Signed)
Two unsuccessful IV attempts made, one in right [facility] and one in left AC.

## 2013-01-01 NOTE — ED Provider Notes (Signed)
History     CSN: [id number]  Arrival date & time 01/01/13  0041   First MD Initiated Contact with Patient 01/01/13 0123      Chief Complaint  Patient presents with  . Dysuria  . Emesis    (Consider location/radiation/quality/duration/timing/severity/associated sxs/prior treatment) HPI Comments: Patient is a 21 y/o F c/o emesis x 2 weeks that has been getting worse yesterday (12/31/2012). Patient reported that she had at least 5-10 episodes of emesis yesterday mainly of the food she was eating - patient reported not being able to keep down any food. Patient reported using Ondansetron 4 mg prescribed by physician with limited relief. Associated symptoms nausea and headaches. Denied fever, chills, abdominal pain, chest pain, difficulty breathing, diarrhea, flank pain.    Patient on Ciprofloxacin 500 mg for UTI.   The history is provided by the patient.    Past Medical History  Diagnosis Date  . High cholesterol   . Chlamydia     age 68yo; hx/o trichomonas age 69yo  . Chronic headache   . Depression     Past Surgical History  Procedure Laterality Date  . Wisdom tooth extraction      Family History  Problem Relation Age of Onset  . Hypertension Mother   . Diabetes Mother   . Asthma Sister   . Cancer Paternal Grandmother   . Cancer Paternal Grandfather   . Heart disease Neg Hx   . Stroke Neg Hx     History  Substance Use Topics  . Smoking status: Current Every Day Smoker -- 1.00 packs/day for 12 years  . Smokeless tobacco: Not on file     Comment: quit x 3 wk as of 03/2012  . Alcohol Use: Yes    OB History   Grav Para Term Preterm Abortions TAB SAB Ect Mult Living                  Review of Systems  Constitutional: Negative for fever and chills.  Respiratory: Negative for chest tightness and shortness of breath.   Cardiovascular: Negative for chest pain.  Gastrointestinal: Positive for nausea and vomiting. Negative for abdominal pain.  Genitourinary:  Negative for dysuria and flank pain.  Neurological: Positive for headaches.  10 Systems reviewed and are negative for acute change except as noted in the HPI.   Allergies  Review of patient's allergies indicates no known allergies.  Home Medications   Current Outpatient Rx  Name  Route  Sig  Dispense  Refill  . ciprofloxacin (CIPRO) 500 MG tablet   Oral   Take 1 tablet (500 mg total) by mouth 2 (two) times daily.   20 tablet   0   . ibuprofen (ADVIL,MOTRIN) 200 MG tablet   Oral   Take 600 mg by mouth every 6 (six) hours as needed for pain.         . naproxen (NAPROSYN) 375 MG tablet   Oral   Take 1 tablet (375 mg total) by mouth 2 (two) times daily with a meal.   45 tablet   0   . ondansetron (ZOFRAN) 4 MG tablet   Oral   Take 1 tablet (4 mg total) by mouth every 8 (eight) hours as needed for nausea.   20 tablet   0   . traMADol (ULTRAM) 50 MG tablet   Oral   Take 1 tablet (50 mg total) by mouth every 6 (six) hours as needed for pain.   20 tablet   0   .  etonogestrel (IMPLANON) 68 MG IMPL implant   Subcutaneous   Inject 1 each into the skin once. 04/2010         . promethazine (PHENERGAN) 25 MG tablet   Oral   Take 1 tablet (25 mg total) by mouth every 6 (six) hours as needed for nausea.   30 tablet   0     BP 113/75  Pulse 59  Temp(Src) 98.5 F (36.9 C) (Oral)  Resp 20  SpO2 100%  Physical Exam  Nursing note and vitals reviewed. Constitutional: She appears well-developed and well-nourished. No distress.  HENT:  Nose: Nose normal.  Mouth/Throat: No oropharyngeal exudate.  Mild erythema to oropharynx.  Eyes: Conjunctivae and EOM are normal. Pupils are equal, round, and reactive to light. Right eye exhibits no discharge. Left eye exhibits no discharge.  Neck: Normal range of motion. Neck supple.  Cardiovascular: Normal rate, regular rhythm, normal heart sounds and intact distal pulses.  Exam reveals no friction rub.   No murmur  heard. Pulmonary/Chest: Effort normal and breath sounds normal. She has no wheezes. She has no rales.  Abdominal: Soft. Bowel sounds are normal. She exhibits no distension. There is no tenderness. There is no rebound.  BS normoactive to all 4 quadrants. Soft. Non-tender upon palpation.   Lymphadenopathy:    She has no cervical adenopathy.  Neurological: She is alert.  Skin: Skin is warm and dry. No rash noted. She is not diaphoretic. No erythema.    ED Course  Procedures (including critical care time)  Labs Reviewed  URINALYSIS, ROUTINE W REFLEX MICROSCOPIC - Abnormal; Notable for the following:    APPearance CLOUDY (*)    Hgb urine dipstick SMALL (*)    All other components within normal limits  URINE MICROSCOPIC-ADD ON - Abnormal; Notable for the following:    Squamous Epithelial / LPF FEW (*)    All other components within normal limits  COMPREHENSIVE METABOLIC PANEL - Abnormal; Notable for the following:    Total Bilirubin 0.2 (*)    GFR calc non Af Amer 89 (*)    All other components within normal limits  CBC WITH DIFFERENTIAL  POCT PREGNANCY, URINE   No results found.  Filed Vitals:   01/01/13 0209  BP: 113/75  Pulse: 59  Temp:   Resp: 20    1. Emesis       MDM  Patient is a 22 y/o c/o emesis that has gotten worse yesterday (12/31/2012), reported having at least 5-10 episodes, mainly of any food or liquid she tried to eat, denied blood. Associated symptoms is nausea and headaches. Denied fever, chills, abdominal pain, diarrhea, chest pain, shortness of breath, difficulty breathing.   I personally evaluated and examined the patient.  PE: Alert, calm. Mouth: Mild erythema to oropharynx. Cardiac: Regular rate and rhythm, S1/S2 normal. Lungs: CTA b/l. Abdomen: Non-distended, soft, non-tender upon palpation.   CBC: negative findings CMP: negative findings Pregnancy urine: negative UA: small Hgb  Patient afebrile, normotensive, non-tachycardic, alert. Able to  tolerate PO fluids. Discharged with possible viral infection. Discharged patient with Phenegran to use as when need for nausea/vomiting - instructed patient on how to take medication. Discussed with patient to follow-up with PCP on Monday (01/03/2013) regarding visit to the ED. Discussed with patient to stay hydrated. Discussed with patient the danger of smoking and being on birth control - risk of clotting. Discussed with patient that if symptoms are to worsen to report back to the ED.  Patient agreed to plan,  understood, all questions answered.         Raymon Mutton, PA-C 01/01/13 [phone]

## 2013-01-02 NOTE — ED Provider Notes (Signed)
Medical screening examination/treatment/procedure(s) were performed by non-physician practitioner and as supervising physician I was immediately available for consultation/collaboration.  Sunnie Nielsen, MD 01/02/13 [phone]

## 2013-01-03 ENCOUNTER — Encounter: Payer: Self-pay | Admitting: Medical

## 2013-01-03 ENCOUNTER — Ambulatory Visit (INDEPENDENT_AMBULATORY_CARE_PROVIDER_SITE_OTHER): Payer: BLUE CROSS/BLUE SHIELD | Admitting: Medical

## 2013-01-03 VITALS — BP 92/60 | HR 62 | Temp 98.2°F | Resp 16 | Wt 187.0 lb

## 2013-01-03 DIAGNOSIS — K5900 Constipation, unspecified: Secondary | ICD-10-CM

## 2013-01-03 DIAGNOSIS — R110 Nausea: Secondary | ICD-10-CM

## 2013-01-03 DIAGNOSIS — R3129 Other microscopic hematuria: Secondary | ICD-10-CM

## 2013-01-03 DIAGNOSIS — R300 Dysuria: Secondary | ICD-10-CM

## 2013-01-03 NOTE — Patient Instructions (Signed)
At this point, stop the Cipro antibiotic.   Use either Zofran or Phenergan for nausea.   continue to drink plenty of water and fluids.   I would keep the solid food intake bland and light for now until you are feeling back to normal.  BRAT diet - bananas, rice, applesauce, toast.    Consider stool softener OTC or laxative short term to help with passage of stool.  Make sure you are getting fiber in diet from breads, vegetables.    If not improving by mid week let me know.

## 2013-01-03 NOTE — Telephone Encounter (Signed)
LMOM TO CB 

## 2013-01-03 NOTE — Progress Notes (Signed)
Subjective: Here for f/u.  Seen by me last week for suspected UTI, on day 5 of Cipro.   Went to the ED this weekend for uncontrollable nausea, despite the zofran.   Was prescribed Phenergan, but not using this.  They felt she had a viral syndrome.  She reports still sick to stomach, restless, not much sleep.   All Sunday laid in bed due to no energy.  Still having urinary frequency, but less discomfort now, urine color looking more normal now.   Denies burning, denies blood in urine, no more urinary frequency and urgency.  No lower abdominal pain.   No rash.  No fever.  Still using zofran for nausea, 2 at a time as she hasn't had a ride to go get phenergan from pharmacy.  She does note some trouble with discomfort in abdomen when trying to defecate.  Usually has BMs daily, last BM yesterday, solid.  No blood in stool, no diarrhea.  In general denies constipation, but having to sit longer on toilet recent to wait on stool to pass.  She does sometimes get relief with abdominal discomfort passing stool.   She recalls prior episode of similar symptoms with nausea. No abdominal cramping in general.   In general, no hx/o IBS, she doesn't skip meals, no bloating, no floating or greasy stools.    Past Medical History  Diagnosis Date  . High cholesterol   . Chlamydia     age 22yo; hx/o trichomonas age 11yo  . Chronic headache   . Depression    ROS as in subjective  Objective: Filed Vitals:   01/03/13 1122  BP: 92/60  Pulse: 62  Temp: 98.2 F (36.8 C)  Resp: 16    General appearance: alert, no distress, WD/WN Neck: supple, no lymphadenopathy, no thyromegaly, no masses Heart: RRR, normal S1, S2, no murmurs Lungs: CTA bilaterally, no wheezes, rhonchi, or rales Abdomen: +bs, soft, non tender, non distended, no masses, no hepatomegaly, no splenomegaly Back: nontender    Assessment: Encounter Diagnoses  Name Primary?  . Nausea alone Yes  . Dysuria   . Unspecified constipation   . Microscopic  hematuria     Plan: Reviewed ED report from this weekend.  Urine culture negative from last week.  Symptoms still suggest resolving UTI and some constipation.  Stop cipro at this point, hydrate well, can either c/t Zofran or Phenergan.  Advised short term laxative and/or stool softener, increase fiber and water intake.   If symptoms not much improved by mid to late this week, let me know.   Will plan to repeat clean catch urine in 2 wk.   If dysuria and hematuria persists, can consider recheck with Urology. Saw urology last year for hematuria, no cystoscopy done at that point given low risk and recent + cultures.   Follow-up prn

## 2013-01-03 NOTE — Telephone Encounter (Signed)
Patient is aware of the limit on her BCP Implanon. CLS

## 2013-03-10 ENCOUNTER — Encounter (HOSPITAL_COMMUNITY): Payer: Self-pay | Admitting: *Deleted

## 2013-03-10 ENCOUNTER — Emergency Department (HOSPITAL_COMMUNITY)
Admission: EM | Admit: 2013-03-10 | Discharge: 2013-03-10 | Disposition: A | Discharge status: Home / Self Care | Payer: Medicaid Other | Source: Home / Self Care

## 2013-03-10 ENCOUNTER — Emergency Department (INDEPENDENT_AMBULATORY_CARE_PROVIDER_SITE_OTHER): Payer: BC Managed Care – PPO

## 2013-03-10 DIAGNOSIS — G8929 Other chronic pain: Secondary | ICD-10-CM

## 2013-03-10 DIAGNOSIS — K5909 Other constipation: Secondary | ICD-10-CM

## 2013-03-10 DIAGNOSIS — K5900 Constipation, unspecified: Secondary | ICD-10-CM

## 2013-03-10 DIAGNOSIS — N920 Excessive and frequent menstruation with regular cycle: Secondary | ICD-10-CM

## 2013-03-10 DIAGNOSIS — M542 Cervicalgia: Secondary | ICD-10-CM

## 2013-03-10 LAB — POCT PREGNANCY, URINE: Preg Test, Ur: NEGATIVE

## 2013-03-10 LAB — POCT URINALYSIS DIP (DEVICE)
Bilirubin Urine: NEGATIVE
Glucose, UA: NEGATIVE mg/dL
Ketones, ur: NEGATIVE mg/dL
Leukocytes, UA: NEGATIVE
Nitrite: NEGATIVE
Protein, ur: NEGATIVE mg/dL
Specific Gravity, Urine: 1.025 (ref 1.005–1.030)
Urobilinogen, UA: 0.2 mg/dL (ref 0.0–1.0)
pH: 6.5 (ref 5.0–8.0)

## 2013-03-10 NOTE — ED Provider Notes (Signed)
History     CSN: [id number]  Arrival date & time 03/10/13  1545   None     Chief Complaint  Patient presents with  . Neck Pain  . Back Pain  . Abdominal Pain    (Consider location/radiation/quality/duration/timing/severity/associated sxs/prior treatment) HPI Comments: This 22 year old female has a rather long list of ongoing complaints in which she has been followed by family medicine next door. Her chief complaint is that of  mid abdominal pain for up to a month. She later said 2 days so am not certain as to the  true onset. The pain is intermittent and described as sharp or crampy. She also notice prior to coming to the urgent care that she had discomfort above her suprapubic area which subsided after voiding. She thinks she may have had a fever 3 days ago and believed it was 100.1 or 0.4. She adds additional symptoms of nausea and vomiting almost every day once for the past month. States she believes she has lost approximately 10 pounds. Has a normal bowel movement on nearly a daily basis. In reviewing her recent notes from an emergency department visit and followup with her primary care the symptoms of nausea, vomiting abdominal pain had been evaluated twice. It was believed at that time that she was probably constipated but the workup was otherwise negative and clinically she was stable and without acute illness. Her presentation today is quite similar to these previous evaluations. She has close to a year history of chronic intermittent neck pain with radicular pain to the right arm. She has seen her PCP several times and has undergone physical therapy. This appears to be a continuous but unchanged problem for her. She also has a history of frequent vaginal discharges including STDs. Her first STD at age 68. She denies symptoms of STD at this time.  She states around the first of the month she had vaginal bleeding which only stopped 3-4 days ago. She has implanon as a birth control method.     Past Medical History  Diagnosis Date  . High cholesterol   . Chlamydia     age 52yo; hx/o trichomonas age 25yo  . Chronic headache   . Depression     Past Surgical History  Procedure Laterality Date  . Wisdom tooth extraction      Family History  Problem Relation Age of Onset  . Hypertension Mother   . Diabetes Mother   . Asthma Sister   . Cancer Paternal Grandmother   . Cancer Paternal Grandfather   . Heart disease Neg Hx   . Stroke Neg Hx     History  Substance Use Topics  . Smoking status: Current Every Day Smoker -- 1.00 packs/day for 12 years    Types: Cigarettes  . Smokeless tobacco: Not on file     Comment: quit x 3 wk as of 03/2012  . Alcohol Use: Yes     Comment: Occasionally    OB History   Grav Para Term Preterm Abortions TAB SAB Ect Mult Living                  Review of Systems  Constitutional: Positive for fever. Negative for chills and activity change.  HENT: Positive for neck pain.   Respiratory: Negative.   Cardiovascular: Negative.   Gastrointestinal: Positive for nausea and vomiting.  Genitourinary: Positive for vaginal bleeding and menstrual problem. Negative for dysuria, flank pain, vaginal discharge, vaginal pain and pelvic pain.  Musculoskeletal: Positive for  arthralgias.       Neck  Neurological: Negative.     Allergies  Review of patient's allergies indicates no known allergies.  Home Medications   Current Outpatient Rx  Name  Route  Sig  Dispense  Refill  . naproxen (NAPROSYN) 375 MG tablet   Oral   Take 1 tablet (375 mg total) by mouth 2 (two) times daily with a meal.   45 tablet   0   . traMADol (ULTRAM) 50 MG tablet   Oral   Take 1 tablet (50 mg total) by mouth every 6 (six) hours as needed for pain.   20 tablet   0   . etonogestrel (IMPLANON) 68 MG IMPL implant   Subcutaneous   Inject 1 each into the skin once. 04/2010         . ibuprofen (ADVIL,MOTRIN) 200 MG tablet   Oral   Take 600 mg by mouth every 6  (six) hours as needed for pain.         Marland Kitchen ondansetron (ZOFRAN) 4 MG tablet   Oral   Take 1 tablet (4 mg total) by mouth every 8 (eight) hours as needed for nausea.   20 tablet   0   . promethazine (PHENERGAN) 25 MG tablet   Oral   Take 1 tablet (25 mg total) by mouth every 6 (six) hours as needed for nausea.   30 tablet   0     BP 121/83  Pulse 81  Temp(Src) 98.3 F (36.8 C) (Other (Comment))  Resp 18  SpO2 99%  LMP 02/10/2013  Physical Exam  Nursing note and vitals reviewed. Constitutional: She is oriented to person, place, and time. She appears well-developed and well-nourished. No distress.  Eyes: Conjunctivae and EOM are normal.  Neck: Neck supple.  Cardiovascular: Normal rate, regular rhythm and normal heart sounds.   Pulmonary/Chest: Effort normal and breath sounds normal. No respiratory distress. She has no wheezes. She has no rales.  Abdominal: Soft. She exhibits no distension and no mass. There is no rebound and no guarding.  Minor periumbilical tenderness.  Musculoskeletal: She exhibits no edema.  Lymphadenopathy:    She has no cervical adenopathy.  Neurological: She is alert and oriented to person, place, and time. She exhibits normal muscle tone.  Skin: Skin is warm and dry. No rash noted. No erythema.  Psychiatric: She has a normal mood and affect.    ED Course  Procedures (including critical care time)  Labs Reviewed  POCT URINALYSIS DIP (DEVICE) - Abnormal; Notable for the following:    Hgb urine dipstick SMALL (*)    All other components within normal limits  POCT PREGNANCY, URINE   Dg Abd 1 View  03/10/2013   *RADIOLOGY REPORT*  Clinical Data: Abdominal pain.  ABDOMEN - 1 VIEW  Comparison: CT of the abdomen and pelvis 04/02/2012.  Findings: Gas and stool are seen scattered throughout the colon extending to the level of the distal rectum.  No pathologic distension of small bowel is noted.  No gross evidence of pneumoperitoneum on this single supine  view.  IMPRESSION: 1.  Nonobstructive bowel gas pattern. 2.  No pneumoperitoneum.   Original Report Authenticated By: Trudie Reed, M.D.     1. Constipation, chronic   2. Chronic neck pain   3. Menorrhagia       MDM  Drink plenty of fluids and increase the fiber content in her diet. May use MiraLax as directed for constipation. 4 chronic has a skeletal/neck  pain continue to see your primary care doctor and obtain referrals as needed. Visual acuity of pain today. 4 irregular vaginal bleeding recommend he also followup with her primary care doctor or gynecologist as your Implanon device may be the etiology of abnormal bleeding. For routine testing of STDs without symptoms may go to the health department.        Hayden Rasmussen, NP 03/10/13 1758

## 2013-03-10 NOTE — ED Notes (Signed)
C/o low abdominal pain onset 5/1  Period from 5/1-5/26.  Cramping serious and has to crouch up and bend over until pain goes away.  No vaginal discharge.  She smelled an odor before her period started.  Heavy bleeding for 2 weeks and spotting the rest of the time.   Also c/o neck and low back pain for months.  Finished PT 3 mos. Ago.

## 2013-03-10 NOTE — ED Provider Notes (Signed)
Medical screening examination/treatment/procedure(s) were performed by non-physician practitioner and as supervising physician I was immediately available for consultation/collaboration.  Leslee Home, M.D.  Reuben Likes, MD 03/10/13 2035

## 2013-03-16 ENCOUNTER — Encounter: Payer: Self-pay | Admitting: Family Medicine

## 2013-03-16 ENCOUNTER — Ambulatory Visit (INDEPENDENT_AMBULATORY_CARE_PROVIDER_SITE_OTHER): Payer: BLUE CROSS/BLUE SHIELD | Admitting: Family Medicine

## 2013-03-16 VITALS — BP 110/68 | HR 80 | Wt 176.0 lb

## 2013-03-16 DIAGNOSIS — K5900 Constipation, unspecified: Secondary | ICD-10-CM

## 2013-03-16 DIAGNOSIS — F32A Depression, unspecified: Secondary | ICD-10-CM

## 2013-03-16 DIAGNOSIS — Z209 Contact with and (suspected) exposure to unspecified communicable disease: Secondary | ICD-10-CM

## 2013-03-16 DIAGNOSIS — F329 Major depressive disorder, single episode, unspecified: Secondary | ICD-10-CM

## 2013-03-16 NOTE — Patient Instructions (Signed)
As long as you are not having any pain and having normal bowel movements nothing else needs to be done. Check with your GYN concerning getting the Implanon taking care of. Get involved in counseling and if you need medication we will work with you

## 2013-03-16 NOTE — Progress Notes (Signed)
  Subjective:    Patient ID: Katelyn Mcfarland, female    DOB: 09/29/1991, 22 y.o.   MRN: [id number]  HPI She is here for consultation after recent urgent care visit. She was evaluated there for multiple issues one being constipation. Since that time she has had regular bowel movements and her abdominal discomfort has gone away. She also is had some irregular menses but does need to have her implant on changed. She would like a suggestion for this. She also has had difficulty with depression since her teenaged years. She admits to being depressed and has tried to get help but states that she doesn't think counseling would be of benefit. She does go to [facility] and has tried to get counseling Esmeralda Arthur to scheduling difficulties. She does not have any suicidal or homicidal ideation.   Review of Systems     Objective:   Physical Exam Alert and in no distress and slightly tearful.       Assessment & Plan:   Contact with or exposure to unspecified communicable disease - Plan: GC/chlamydia probe amp, urine, RPR  Unspecified constipation  Depression  As long as you are not having any pain and having normal bowel movements nothing else needs to be done. Check with your GYN concerning getting the Implanon taking care of. Get involved in counseling and if you need medication we will work with you.

## 2013-03-17 LAB — GC/CHLAMYDIA PROBE AMP, URINE
Chlamydia, Swab/Urine, PCR: NEGATIVE
GC Probe Amp, Urine: NEGATIVE

## 2013-03-17 LAB — RPR

## 2013-04-28 ENCOUNTER — Telehealth: Payer: Self-pay | Admitting: Family Medicine

## 2013-04-28 NOTE — Telephone Encounter (Signed)
CALLED DR.MARSHALLS OFFICE GAVE NPI AND CALLED PT TO TELL HER SHE NEEDS TO CALL DR.MARSHALL TO MAKE APT

## 2013-04-28 NOTE — Telephone Encounter (Signed)
Set this up 

## 2013-05-24 ENCOUNTER — Encounter: Payer: Self-pay | Admitting: Medical

## 2013-05-24 ENCOUNTER — Ambulatory Visit (INDEPENDENT_AMBULATORY_CARE_PROVIDER_SITE_OTHER): Payer: Medicaid Other | Admitting: Medical

## 2013-05-24 VITALS — BP 100/60 | HR 77 | Temp 97.4°F | Resp 16 | Wt 183.0 lb

## 2013-05-24 DIAGNOSIS — F17200 Nicotine dependence, unspecified, uncomplicated: Secondary | ICD-10-CM

## 2013-05-24 DIAGNOSIS — Z309 Encounter for contraceptive management, unspecified: Secondary | ICD-10-CM

## 2013-05-24 LAB — POCT URINE PREGNANCY: Preg Test, Ur: NEGATIVE

## 2013-05-24 MED ORDER — NORETHINDRONE 0.35 MG PO TABS: 1.0000 | ORAL_TABLET | Freq: Every day | ORAL | Status: DC

## 2013-05-24 NOTE — Progress Notes (Signed)
Subjective: Wants to discuss birth control options.  Implanon was put in 04/2010, was taken out about 2 weeks ago.  Dr. Gaynell Face, her gynecologist took the Implanon out.  They briefly discussed options, but she didn't decide specifically at that time on next steps.  She now does want to resume birth control, wants to explore options . Thinks she would like to be on the pill.  In the past has used Implanon, Nuvaring, Depo Provera. Has never used OCPs.  The extraction of the Implanon was painful and difficult so she doesn't want to pursue this again.   Didn't like Nuvaring, made her feel dirty, didn't like the way it felt.    She started back smoking again.   Denies hx/o DVT or PE.   She has 2 children, 4 pregnancies prior, 1 miscarriage, 1 abortion.  Denies hx/o clots or miscarriages in the family history.    Past Medical History  Diagnosis Date  . High cholesterol   . Chlamydia     age 22yo; hx/o trichomonas age 19yo  . Chronic headache   . Depression    ROS Gen: no weight change, no fever, no sweats No chest pain, palpitations, edema,SOB, no vaginal c/o, no discharge, no urinary c/o, no abdominal or back pain.  ROS otherwise as in subjective    Objective: Gen: wd, wn, nad   Assessment: Encounter Diagnoses  Name Primary?  . Contraception management Yes  . Tobacco use disorder     Plan: Discussed options for contraception.  Discussed risks of OCPs including MI, DVT, PE, stroke, and possibly certain cancers.  Discussed increased risks of adverse events being a tobacco user.  She understands risks, options, and wants to pursue OCPs.   Prior contraception has included Implanon, depo provera, nuvaring.  Urine pregnancy test negative.  Last pap from 2012 reviewed and was negative.  Reviewed her recent STD testing and recent exam notes.   Discussed safe sex, condom use, STD prevention.  She will be due for pap in 1 year.  Begin Camila OCP.

## 2013-05-24 NOTE — Patient Instructions (Signed)
Contraception Choices  Contraception (birth control) is the use of any methods or devices to prevent pregnancy. Below are some methods to help avoid pregnancy.  HORMONAL METHODS   · Contraceptive implant. This is a thin, plastic tube containing progesterone hormone. It does not contain estrogen hormone. Your caregiver inserts the tube in the inner part of the upper arm. The tube can remain in place for up to 3 years. After 3 years, the implant must be removed. The implant prevents the ovaries from releasing an egg (ovulation), thickens the cervical mucus which prevents sperm from entering the uterus, and thins the lining of the inside of the uterus.  · Progesterone-only injections. These injections are given every 3 months by your caregiver to prevent pregnancy. This synthetic progesterone hormone stops the ovaries from releasing eggs. It also thickens cervical mucus and changes the uterine lining. This makes it harder for sperm to survive in the uterus.  · Birth control pills. These pills contain estrogen and progesterone hormone. They work by stopping the egg from forming in the ovary (ovulation). Birth control pills are prescribed by a caregiver. Birth control pills can also be used to treat heavy periods.  · Minipill. This type of birth control pill contains only the progesterone hormone. They are taken every day of each month and must be prescribed by your caregiver.  · Birth control patch. The patch contains hormones similar to those in birth control pills. It must be changed once a week and is prescribed by a caregiver.  · Vaginal ring. The ring contains hormones similar to those in birth control pills. It is left in the vagina for 3 weeks, removed for 1 week, and then a new one is put back in place. The patient must be comfortable inserting and removing the ring from the vagina. A caregiver's prescription is necessary.  · Emergency contraception. Emergency contraceptives prevent pregnancy after unprotected  sexual intercourse. This pill can be taken right after sex or up to 5 days after unprotected sex. It is most effective the sooner you take the pills after having sexual intercourse. Emergency contraceptive pills are available without a prescription. Check with your pharmacist. Do not use emergency contraception as your only form of birth control.  BARRIER METHODS   · Female condom. This is a thin sheath (latex or rubber) that is worn over the penis during sexual intercourse. It can be used with spermicide to increase effectiveness.  · Female condom. This is a soft, loose-fitting sheath that is put into the vagina before sexual intercourse.  · Diaphragm. This is a soft, latex, dome-shaped barrier that must be fitted by a caregiver. It is inserted into the vagina, along with a spermicidal jelly. It is inserted before intercourse. The diaphragm should be left in the vagina for 6 to 8 hours after intercourse.  · Cervical cap. This is a round, soft, latex or plastic cup that fits over the cervix and must be fitted by a caregiver. The cap can be left in place for up to 48 hours after intercourse.  · Sponge. This is a soft, circular piece of polyurethane foam. The sponge has spermicide in it. It is inserted into the vagina after wetting it and before sexual intercourse.  · Spermicides. These are chemicals that kill or block sperm from entering the cervix and uterus. They come in the form of creams, jellies, suppositories, foam, or tablets. They do not require a prescription. They are inserted into the vagina with an applicator before having sexual intercourse.   IUD). This is a T-shaped device that is put in a woman's uterus during a menstrual period to prevent pregnancy. There are 2 types:  Copper IUD. This type of IUD is wrapped in copper wire and is placed inside the uterus. Copper makes the uterus and  fallopian tubes produce a fluid that kills sperm. It can stay in place for 10 years.  Hormone IUD. This type of IUD contains the hormone progestin (synthetic progesterone). The hormone thickens the cervical mucus and prevents sperm from entering the uterus, and it also thins the uterine lining to prevent implantation of a fertilized egg. The hormone can weaken or kill the sperm that get into the uterus. It can stay in place for 5 years. PERMANENT METHODS OF CONTRACEPTION  Female tubal ligation. This is when the woman's fallopian tubes are surgically sealed, tied, or blocked to prevent the egg from traveling to the uterus.  Female sterilization. This is when the female has the tubes that carry sperm tied off (vasectomy).This blocks sperm from entering the vagina during sexual intercourse. After the procedure, the man can still ejaculate fluid (semen). NATURAL PLANNING METHODS  Natural family planning. This is not having sexual intercourse or using a barrier method (condom, diaphragm, cervical cap) on days the woman could become pregnant.  Calendar method. This is keeping track of the length of each menstrual cycle and identifying when you are fertile.  Ovulation method. This is avoiding sexual intercourse during ovulation.  Symptothermal method. This is avoiding sexual intercourse during ovulation, using a thermometer and ovulation symptoms.  Post-ovulation method. This is timing sexual intercourse after you have ovulated. Regardless of which type or method of contraception you choose, it is important that you use condoms to protect against the transmission of sexually transmitted diseases (STDs). Talk with your caregiver about which form of contraception is most appropriate for you. Document Released: 09/29/2005 Document Revised: 12/22/2011 Document Reviewed: 02/05/2011 [facility] Patient Information 2014 [place], [place].    Levonorgestrel emergency contraceptive kit What is this  medicine? LEVONORGESTREL (LEE voe nor jes trel) is an emergency contraceptive (birth control pill). It prevents pregnancy if taken within the 72 hours after unprotected sex. This medicine will not work if you are already pregnant. This medicine may be used for other purposes; ask your health care provider or pharmacist if you have questions. What should I tell my health care provider before I take this medicine? They need to know if you have or ever had any of these conditions: -blood sugar problems, like diabetes -cancer of the breast, cervix, ovary, uterus, vagina, or unusual vaginal bleeding -fibroids -liver disease -menstrual problems -migraine headaches -an unusual or allergic reaction to levonorgestrel, other medicines, foods, dyes, or preservatives -pregnant or trying to get pregnant -breast-feeding How should I use this medicine? Take this medicine by mouth. Your doctor may want you to use a quick-response pregnancy test prior to using the tablets. Take your medicine as soon as you can after having unprotected sex, preferably in the first 24 hours, but no later than 72 hours (3 days) after the event. Follow the dose instructions of your health care provider exactly. Do not take any extra pills. Extra pills will not decrease your risk of pregnancy, but may increase your risk of side effects. A patient package insert for the product will be given with each prescription and refill. Read this sheet carefully each time. The sheet may change frequently. Contact your pediatrician regarding the use of this medicine in children. Special care may  be needed. This medicine has been used in female children who have started having menstrual periods. Overdosage: If you think you have taken too much of this medicine contact a poison control center or emergency room at once. NOTE: This medicine is only for you. Do not share this medicine with others. What if I miss a dose? If you miss a dose or vomit  within 1 hour of taking your dose, you MUST contact your health care professional for instructions. What may interact with this medicine? Do not take this medicine with any of the following medications: -amprenavir -bosentan -fosamprenavir This medicine may also interact with the following medications: -aprepitant -barbiturate medicines for inducing sleep or treating seizures -bexarotene -griseofulvin -medicines to treat seizures like carbamazepine, ethotoin, felbamate, oxcarbazepine, phenytoin, topiramate -modafinil -pioglitazone -rifabutin -rifampin -rifapentine -some medicines to treat HIV infection like atazanavir, indinavir, lopinavir, nelfinavir, tipranavir, ritonavir -St. John's wort -warfarin This list may not describe all possible interactions. Give your health care provider a list of all the medicines, herbs, non-prescription drugs, or dietary supplements you use. Also tell them if you smoke, drink alcohol, or use illegal drugs. Some items may interact with your medicine. What should I watch for while using this medicine? Emergency birth control is not to be used routinely to prevent pregnancy. Discuss birth control options with your health care provider. Make a follow-up appointment to see your health care provider in 3 to 4 weeks after using this medicine. It is common to have spotting after using this medicine. If you miss your next period, the possibility of pregnancy must be considered. See your health care professional as soon as you can and get a pregnancy test. Smoking increases the risk of getting a blood clot or having a stroke while you are taking birth control pills, especially if you are more than 24 years old. You are strongly advised not to smoke. This medicine does not protect you against HIV infection (AIDS) or any other sexually transmitted diseases. What side effects may I notice from receiving this medicine? Side effects that you should report to your doctor  or health care professional as soon as possible: -Severe side effects are not common. However, the potential for severe side effects may exist and you may want to discuss these with your health care provider. Side effects that usually do not require medical attention (report to your doctor or health care professional if they continue or are bothersome): -abdominal pain or cramping -breast tenderness -dizziness -nausea -spotting This list may not describe all possible side effects. Call your doctor for medical advice about side effects. You may report side effects to FDA at 1-800-FDA-1088. Where should I keep my medicine? Keep out of the reach of children. Store at room temperature between 15 and 30 degrees C (59 and 86 degrees F). Throw away any unused medicine after the expiration date. NOTE: This sheet is a summary. It may not cover all possible information. If you have questions about this medicine, talk to your doctor, pharmacist, or health care provider.  2013, [employer]. (09/14/2008 1:51:24 PM)   Oral Contraception Information Oral contraceptives (OCs) are medicines taken to prevent pregnancy. OCs work by preventing the ovaries from releasing eggs. The hormones in OCs also cause the cervical mucus to thicken, preventing the sperm from entering the uterus. The hormones also cause the uterine lining to become thin, not allowing a fertilized egg to attach to the inside of the uterus. OCs are highly effective when taken exactly as prescribed.  However, OCs do not prevent sexually transmitted diseases (STDs). Safe sex practices, such as using condoms along with the pill, can help prevent STDs.  Before taking the pill, you may have a physical exam and Pap test. Your caregiver may order blood tests that may be necessary. Your caregiver will make sure you are a good candidate for oral contraception. Discuss with your caregiver the possible side effects of the OC you may be prescribed.  When starting an OC, it can take 2 to 3 months for the body to adjust to the changes in hormone levels in your body.  TYPES OF ORAL CONTRACEPTION  The combination pill. This pill contains estrogen and progestin (synthetic progesterone) hormones. The combination pill comes in either 21-day or 28-day packs. With 21-day packs, you do not take pills for 7 days after the last pill. With 28-day packs, the pill is taken every day. The last 7 pills are without hormones. Certain types of pills have more than 21 hormone-containing pills.  The minipill. This pill contains the progesterone hormone only. It is taken every day continuously. The minipill comes in packs of 91 pills. The first 84 pills contain the hormones, and the last 7 pills do not. The last 7 days are when you will have your menstrual period. You may experience irregular spotting. ADVANTAGES  Decreases premenstrual symptoms.  Treats menstrual period cramps.  Regulates the menstrual cycle.  Decreases a heavy menstrual flow.  Treats acne.  Treats abnormal uterine bleeding.  Treats chronic pelvic pain.  Treats polycystic ovarian syndrome.  Treats endometriosis.  Can be used as emergency contraception. DISADVANTAGES OCs can be less effective if:  You forget to take the pill at the same time every day.  You have a stomach or intestinal disease that lessens the absorption of the pill.  You take OCs with other medicines that make OCs less effective.  You take expired OCs.  You forget to restart the pill on day 7, when using the packs of 21 pills. Document Released: 12/20/2002 Document Revised: 12/22/2011 Document Reviewed: 02/05/2011 [facility] Patient Information 2014 [place], [place].

## 2013-05-25 ENCOUNTER — Other Ambulatory Visit: Payer: Self-pay

## 2013-05-26 ENCOUNTER — Other Ambulatory Visit (INDEPENDENT_AMBULATORY_CARE_PROVIDER_SITE_OTHER): Payer: Medicaid Other

## 2013-05-26 DIAGNOSIS — Z23 Encounter for immunization: Secondary | ICD-10-CM | POA: Diagnosis not present

## 2013-06-26 ENCOUNTER — Encounter (HOSPITAL_COMMUNITY): Payer: Self-pay | Admitting: Nurse Practitioner

## 2013-06-26 ENCOUNTER — Emergency Department (HOSPITAL_COMMUNITY): Payer: Medicaid Other

## 2013-06-26 ENCOUNTER — Emergency Department (HOSPITAL_COMMUNITY)
Admission: EM | Admit: 2013-06-26 | Discharge: 2013-06-26 | Disposition: A | Discharge status: Home / Self Care | Payer: Medicaid Other | Attending: Emergency Medicine | Admitting: Emergency Medicine

## 2013-06-26 DIAGNOSIS — R5381 Other malaise: Secondary | ICD-10-CM | POA: Insufficient documentation

## 2013-06-26 DIAGNOSIS — J029 Acute pharyngitis, unspecified: Secondary | ICD-10-CM | POA: Insufficient documentation

## 2013-06-26 DIAGNOSIS — Z3202 Encounter for pregnancy test, result negative: Secondary | ICD-10-CM | POA: Insufficient documentation

## 2013-06-26 DIAGNOSIS — Z79899 Other long term (current) drug therapy: Secondary | ICD-10-CM | POA: Insufficient documentation

## 2013-06-26 DIAGNOSIS — F329 Major depressive disorder, single episode, unspecified: Secondary | ICD-10-CM | POA: Insufficient documentation

## 2013-06-26 DIAGNOSIS — Z8619 Personal history of other infectious and parasitic diseases: Secondary | ICD-10-CM | POA: Insufficient documentation

## 2013-06-26 DIAGNOSIS — R110 Nausea: Secondary | ICD-10-CM | POA: Insufficient documentation

## 2013-06-26 DIAGNOSIS — R05 Cough: Secondary | ICD-10-CM

## 2013-06-26 DIAGNOSIS — Z8639 Personal history of other endocrine, nutritional and metabolic disease: Secondary | ICD-10-CM | POA: Insufficient documentation

## 2013-06-26 DIAGNOSIS — F17200 Nicotine dependence, unspecified, uncomplicated: Secondary | ICD-10-CM | POA: Insufficient documentation

## 2013-06-26 DIAGNOSIS — G8929 Other chronic pain: Secondary | ICD-10-CM | POA: Insufficient documentation

## 2013-06-26 DIAGNOSIS — Z862 Personal history of diseases of the blood and blood-forming organs and certain disorders involving the immune mechanism: Secondary | ICD-10-CM | POA: Insufficient documentation

## 2013-06-26 DIAGNOSIS — F3289 Other specified depressive episodes: Secondary | ICD-10-CM | POA: Insufficient documentation

## 2013-06-26 DIAGNOSIS — R109 Unspecified abdominal pain: Secondary | ICD-10-CM | POA: Insufficient documentation

## 2013-06-26 DIAGNOSIS — R059 Cough, unspecified: Secondary | ICD-10-CM | POA: Insufficient documentation

## 2013-06-26 DIAGNOSIS — J3489 Other specified disorders of nose and nasal sinuses: Secondary | ICD-10-CM | POA: Insufficient documentation

## 2013-06-26 DIAGNOSIS — H9209 Otalgia, unspecified ear: Secondary | ICD-10-CM | POA: Insufficient documentation

## 2013-06-26 LAB — RAPID STREP SCREEN (MED CTR MEBANE ONLY): Streptococcus, Group A Screen (Direct): NEGATIVE

## 2013-06-26 LAB — POCT PREGNANCY, URINE: Preg Test, Ur: NEGATIVE

## 2013-06-26 MED ORDER — DEXAMETHASONE SODIUM PHOSPHATE 10 MG/ML IJ SOLN
10.0000 mg | Freq: Once | INTRAMUSCULAR | Status: AC
  Administered 2013-06-26: 10 mg via INTRAMUSCULAR
  Filled 2013-06-26: qty 1

## 2013-06-26 MED ORDER — KETOROLAC TROMETHAMINE 30 MG/ML IJ SOLN: 30.0000 mg | Freq: Once | INTRAMUSCULAR | Status: DC

## 2013-06-26 MED ORDER — HYDROCODONE-ACETAMINOPHEN 5-325 MG PO TABS: 1.0000 | ORAL_TABLET | ORAL | Status: DC | PRN

## 2013-06-26 MED ORDER — KETOROLAC TROMETHAMINE 30 MG/ML IJ SOLN
30.0000 mg | Freq: Once | INTRAMUSCULAR | Status: AC
  Administered 2013-06-26: 30 mg via INTRAMUSCULAR
  Filled 2013-06-26: qty 1

## 2013-06-26 NOTE — ED Notes (Signed)
C/o sore throat, R ear pain, "hoarse voice" for past week. A&Ox4, resp e/u

## 2013-06-26 NOTE — ED Provider Notes (Signed)
CSN: [id number]     Arrival date & time 06/26/13  1247 History  This chart was scribed for non-physician practitioner Coral Ceo, PA-C, working with Glynn Octave, MD by Dorothey Baseman, ED Scribe. This patient was seen in room TR07C/TR07C and the patient's care was started at 1:44 PM.    Chief Complaint  Patient presents with  . Sore Throat   The history is provided by the patient. No language interpreter was used.   HPI Comments: Katelyn Mcfarland is a 22 y.o. female with a PMH of chronic headache, HLD, and depression who presents to the Emergency Department complaining of sore throat.  Sore throat began 1 week ago that has been progressively worsening over the past 2 days. She reports associated right ear pain, headache, cough with sputum and without blood, congestion, and nausea. She states that she has not been eating or drinking as much due to pain. She reports a history of intermittent abdominal pain, but denies any recent changes. She denies any hearing changes, chest pain, shortness of breath, vomiting, diarrhea, fever, or any other symptoms. She denies any sick contacts, but reports that she recently started a new job. She states that she has an appointment with her PCP tomorrow.   Past Medical History  Diagnosis Date  . High cholesterol   . Chlamydia     age 62yo; hx/o trichomonas age 32yo  . Chronic headache   . Depression    Past Surgical History  Procedure Laterality Date  . Wisdom tooth extraction     Family History  Problem Relation Age of Onset  . Hypertension Mother   . Diabetes Mother   . Asthma Sister   . Cancer Paternal Grandmother   . Cancer Paternal Grandfather   . Heart disease Neg Hx   . Stroke Neg Hx    History  Substance Use Topics  . Smoking status: Current Every Day Smoker -- 1.00 packs/day for 12 years    Types: Cigarettes  . Smokeless tobacco: Not on file     Comment: quit x 3 wk as of 03/2012  . Alcohol Use: Yes     Comment: Occasionally   OB  History   Grav Para Term Preterm Abortions TAB SAB Ect Mult Living                 Review of Systems  Constitutional: Positive for appetite change and fatigue. Negative for fever, chills, diaphoresis and activity change.  HENT: Positive for ear pain, congestion, sore throat, rhinorrhea and voice change. Negative for hearing loss, drooling, mouth sores, trouble swallowing, neck pain, neck stiffness, sinus pressure and tinnitus.   Eyes: Negative for photophobia and visual disturbance.  Respiratory: Positive for cough. Negative for shortness of breath and wheezing.   Cardiovascular: Negative for chest pain and leg swelling.  Gastrointestinal: Positive for nausea and abdominal pain (intermittent - chronic). Negative for vomiting, diarrhea, constipation, blood in stool, anal bleeding and rectal pain.  Genitourinary: Negative for dysuria.  Musculoskeletal: Negative for back pain, joint swelling and gait problem.  Skin: Negative for rash and wound.  Neurological: Positive for headaches. Negative for dizziness, syncope, weakness, light-headedness and numbness.  All other systems reviewed and are negative.    Allergies  Review of patient's allergies indicates no known allergies.  Home Medications   Current Outpatient Rx  Name  Route  Sig  Dispense  Refill  . ibuprofen (ADVIL,MOTRIN) 200 MG tablet   Oral   Take 600 mg by mouth every 6 (  six) hours as needed for pain.         Marland Kitchen norethindrone (CAMILA) 0.35 MG tablet   Oral   Take 1 tablet (0.35 mg total) by mouth daily.   1 Package   11   . traMADol (ULTRAM) 50 MG tablet   Oral   Take 1 tablet (50 mg total) by mouth every 6 (six) hours as needed for pain.   20 tablet   0    Triage Vitals: BP 124/80  Pulse 108  Temp(Src) 98.9 F (37.2 C) (Oral)  Resp 18  SpO2 97%  Filed Vitals:   06/26/13 1250 06/26/13 1411 06/26/13 1602  BP: 124/80 115/78   Pulse: 108 106 96  Temp: 98.9 F (37.2 C) 98.6 F (37 C)   TempSrc: Oral     Resp: 18 16 18   SpO2: 97% 97% 98%     Physical Exam  Nursing note and vitals reviewed. Constitutional: She is oriented to person, place, and time. She appears well-developed and well-nourished. No distress.  HENT:  Head: Normocephalic and atraumatic.  Right Ear: External ear normal.  Left Ear: External ear normal.  Mouth/Throat: Oropharynx is clear and moist. No oropharyngeal exudate.  No tonsillar exudate. Mild tonsillar edema and erythema.  Uvula midline.  No trismus.  Tm's gray and translucent bilaterally.  Hoarse voice.  No difficulty controlling secretions or breathing.    Eyes: Conjunctivae are normal. Pupils are equal, round, and reactive to light. Right eye exhibits no discharge. Left eye exhibits no discharge.  Neck: Normal range of motion. Neck supple.  Mild swelling to submandibular lymph nodes. No masses noted. No limitations with ROM.    Cardiovascular: Normal rate, regular rhythm, normal heart sounds and intact distal pulses.  Exam reveals no gallop and no friction rub.   No murmur heard. Pulmonary/Chest: Effort normal and breath sounds normal. No respiratory distress. She has no wheezes. She has no rales. She exhibits no tenderness.  Abdominal: Soft. She exhibits no distension. There is no tenderness.  Musculoskeletal: Normal range of motion. She exhibits no edema and no tenderness.  No tenderness to palpation to back.  Neurological: She is alert and oriented to person, place, and time.  Skin: Skin is warm and dry.  Psychiatric: She has a normal mood and affect. Her behavior is normal.    ED Course  Procedures (including critical care time)  Medications  dexamethasone (DECADRON) injection 10 mg (not administered)  ketorolac (TORADOL) 30 MG/ML injection 30 mg (not administered)   DIAGNOSTIC STUDIES: Oxygen Saturation is 97% on room air, normal by my interpretation.    COORDINATION OF CARE: 1:51PM- Will order IV fluids to treat dehydration. Will order x-rays of  the chest and neck. Advised patient to stay out of work until symptoms subside. Discussed treatment plan with patient at bedside and patient verbalized agreement.   Labs Review Labs Reviewed  RAPID STREP SCREEN  CULTURE, GROUP A STREP  POCT PREGNANCY, URINE   Imaging Review Dg Neck Soft Tissue  06/26/2013   CLINICAL DATA:  22 year old female with congestion, cough, pain, sore throat.  EXAM: NECK SOFT TISSUES - 1+ VIEW  COMPARISON:  10/28/2012.  FINDINGS: Stable cervical vertebral height and alignment. Normal and interval decreased prevertebral soft tissue contour. Pharyngeal soft tissue contours are within normal limits. Visualized tracheal air column is within normal limits. No radiopaque foreign body identified. Bone mineralization is within normal limits.  IMPRESSION: Normal lateral radiographic appearance of the neck soft tissues.   Electronically Signed  By: Augusto Gamble M.D.   On: 06/26/2013 14:44   Dg Chest 2 View  06/26/2013   *RADIOLOGY REPORT*  Clinical Data: Chest pain  CHEST - 2 VIEW  Comparison: March 19, 2012.  Findings: Cardiomediastinal silhouette appears normal.  No acute pulmonary disease is noted.  Bony thorax is intact.  IMPRESSION: No acute cardiopulmonary abnormality seen.   Original Report Authenticated By: Lupita Raider.,  M.D.   Results for orders placed during the hospital encounter of 06/26/13  RAPID STREP SCREEN      Result Value Range   Streptococcus, Group A Screen (Direct) NEGATIVE  NEGATIVE  CULTURE, GROUP A STREP      Result Value Range   Specimen Description THROAT     Special Requests NONE     Culture       Value: Culture reincubated for better growth     Performed at [facility]   Report Status PENDING    POCT PREGNANCY, URINE      Result Value Range   Preg Test, Ur NEGATIVE  NEGATIVE     MDM   1. Sore throat   2. Cough     Katelyn Mcfarland is a 22 y.o. female with a PMH of chronic headache, HLD, and depression who presents to the  Emergency Department complaining of sore throat.  Rapid strep ordered to evaluate sore throat.  Pregnancy tested ordered.  Decadron and Toradol ordered for symptomatic relief.  Chest xray and soft tissue neck ordered to further evaluate.     Rechecks  4:00 PM = Patient states she feels better after Decadron and Toradol.  Tolerating PO intake without difficulty.     Etiology of cough and sore throat is possibly due to laryngitis.  X-rays were negative for acute cardiopulmonary process and soft tissue neck abnormality.  No evidence of peritonsillar abscess on exam.  Patient was able to tolerate PO intake without difficulty. No difficulty controlling secretions or breathing. I suspect tachycardia is due to dehydration from decreased PO intake vs. pain response.  Her tachycardia improved after fluid administration and analgesia.  Rapid strep negative.  Patient was afebrile and remained in no acute distress throughout her ED visit.  Patient was prescribed a few vicodin for outpatient management of pain.  Patient was instructed to return to the ED if she experiences any fever, difficulty swallowing/breathing, stiff neck, or other concerns.  Patient has an appointment with her PCP tomorrow for further evaluation and management.  Patient was in agreement with discharge and plan.     Final impressions: 1. Sore throat  2. Cough     Luiz Iron PA-C   This patient was discussed with Dr. Manus Gunning    I personally performed the services described in this documentation, which was scribed in my presence. The recorded information has been reviewed and is accurate.    Jillyn Ledger, PA-C 06/28/13 1002

## 2013-06-27 ENCOUNTER — Ambulatory Visit (INDEPENDENT_AMBULATORY_CARE_PROVIDER_SITE_OTHER): Payer: Medicaid Other | Admitting: Family Medicine

## 2013-06-27 ENCOUNTER — Encounter: Payer: Self-pay | Admitting: Family Medicine

## 2013-06-27 VITALS — BP 126/82 | HR 72 | Temp 98.2°F | Wt 177.0 lb

## 2013-06-27 DIAGNOSIS — J040 Acute laryngitis: Secondary | ICD-10-CM

## 2013-06-27 DIAGNOSIS — J029 Acute pharyngitis, unspecified: Secondary | ICD-10-CM

## 2013-06-27 NOTE — Patient Instructions (Signed)
Use of Vicodin at night and use Advil or Aleve during the day

## 2013-06-27 NOTE — Progress Notes (Signed)
  Subjective:    Patient ID: Katelyn Mcfarland, female    DOB: 04-02-1991, 22 y.o.   MRN: [id number]  HPI One week ago she started with a sore throat and then developed a hoarse voice and drainage from the right ear with pain in both ears as well as cough. She was seen in the emergency room yesterday. She was given a steroid injection as well as pain pills. Strep screen was negative.   Review of Systems     Objective:   Physical Exam alert and in no distress. Tympanic membranes and canals are normal. Throat is clear. Tonsils are normal. Neck is supple without adenopathy or thyromegaly. Cardiac exam shows a regular sinus rhythm without murmurs or gallops. Lungs are clear to auscultation.        Assessment & Plan:  Laryngitis, acute  Acute pharyngitis  explained that her symptoms especially in light of the emergency room visit a negative workup are more likely viral. Encouraged supportive care.

## 2013-06-28 LAB — CULTURE, GROUP A STREP

## 2013-06-28 NOTE — ED Provider Notes (Signed)
Medical screening examination/treatment/procedure(s) were performed by non-physician practitioner and as supervising physician I was immediately available for consultation/collaboration.   Glynn Octave, MD 06/28/13 1041

## 2013-08-15 ENCOUNTER — Ambulatory Visit (INDEPENDENT_AMBULATORY_CARE_PROVIDER_SITE_OTHER): Payer: Medicaid Other | Admitting: Medical

## 2013-08-15 ENCOUNTER — Encounter: Payer: Self-pay | Admitting: Medical

## 2013-08-15 VITALS — BP 100/70 | HR 92 | Temp 97.5°F | Resp 16 | Wt 178.0 lb

## 2013-08-15 DIAGNOSIS — Z309 Encounter for contraceptive management, unspecified: Secondary | ICD-10-CM

## 2013-08-15 DIAGNOSIS — N6452 Nipple discharge: Secondary | ICD-10-CM

## 2013-08-15 LAB — POCT URINE PREGNANCY: Preg Test, Ur: NEGATIVE

## 2013-08-15 MED ORDER — CEPHALEXIN 500 MG PO CAPS: 500.0000 mg | ORAL_CAPSULE | Freq: Three times a day (TID) | ORAL | Status: DC

## 2013-08-15 NOTE — Progress Notes (Signed)
Subjective: Been having some pain in right breast x 3 days.  Seeing thick white discharge followed by clear fluid from the the nipple, few times a day.   Mild amount.   No prior similar.  No problems with left breast.  Denies warmth, no swelling, just sore all over.  No left breast c/o.  Denies nausea, fever, no menstrual changes. LMP 2 wk ago.  Last sexual activity 3 wk ago, condoms.  Breast cancer is on both sides but not first degree.  She is curious about birth control.   We discussed this last visit.  Prior contraception: Implanon was put in 04/2010, was taken out about in July 2014.  Dr. Gaynell Face, her gynecologist took the Implanon out.  In the past has used Implanon, Nuvaring, Depo Provera.  Has never used oral contraception.  The extraction of the Implanon was painful and difficult so she doesn't want to pursue this again.  Didn't like Nuvaring, made her feel dirty, didn't like the way it felt.    She started back smoking again.   Denies hx/o DVT or PE.   She has 2 children, 4 pregnancies prior, 1 miscarriage, 1 abortion.  Denies hx/o clots or miscarriages in the family history.    Past Medical History  Diagnosis Date  . High cholesterol   . Chlamydia     age 48yo; hx/o trichomonas age 72yo  . Chronic headache   . Depression    ROS Gen: no weight change, no fever, no sweats No chest pain, palpitations, edema,SOB, no vaginal c/o, no vaginal discharge, no urinary c/o, no abdominal or back pain.  ROS otherwise as in subjective   Objective: Gen: wd, wn, nad Breasts: right nipple with small brown crusted area, somewhat nodular fibrocystic tissue bilat, otherwise no warmth, no drainage, no mass, no skin changes, no lymphadenopathy    Assessment: Encounter Diagnoses  Name Primary?  . Contraception management Yes  . Nipple discharge in female      Plan: Discussed options for contraception.  Discussed risks of OCPs including MI, DVT, PE, stroke, and possibly certain cancers.   Discussed increased risks of adverse events being a tobacco user.  She understands risks, options, and wants to pursue OCPs.   Prior contraception has included Implanon, depo provera, nuvaring.  Urine pregnancy test negative.  Last pap from 2012 reviewed and was negative.  Reviewed her recent STD testing and recent exam notes.   Discussed safe sex, condom use, STD prevention.  She will be due for pap in 1 year.  She will return within 5 days of next menstrual period to start Depo Provera.   Nipple discharge - will treat presumably for early infection with Keflex, but if not resolving in 1-2 wk, then pursue breast ultrasound.     F/u with call back in 1-2 wk

## 2013-08-15 NOTE — Addendum Note (Signed)
Addended by: Leretha Dykes L on: 08/15/2013 11:20 AM   Modules accepted: Orders

## 2013-08-19 ENCOUNTER — Telehealth: Payer: Self-pay | Admitting: Medical

## 2013-08-19 NOTE — Telephone Encounter (Signed)
Other options would be IUD or oral contraception such as Ortho Tri Cyclen which she would have to be good about taking every day, same time daily.  Does she want referral to gyn for IUD, or begin oral?

## 2013-09-12 ENCOUNTER — Telehealth: Payer: Self-pay | Admitting: Internal Medicine

## 2013-09-12 NOTE — Telephone Encounter (Signed)
i thought she was going to start Depo Provera?  If not, then refer for Implanon.  Find out which gynecologist she wants to see

## 2013-09-12 NOTE — Telephone Encounter (Signed)
Pt needs a referral to Dr. Gaynell Face gynecologist to get the implanon in. Pt gave me [phone] to their office to do a referral

## 2013-09-13 ENCOUNTER — Encounter: Payer: Self-pay | Admitting: Medical

## 2013-09-13 ENCOUNTER — Ambulatory Visit (INDEPENDENT_AMBULATORY_CARE_PROVIDER_SITE_OTHER): Payer: Medicaid Other | Admitting: Medical

## 2013-09-13 VITALS — BP 110/80 | HR 90 | Temp 98.0°F | Resp 16 | Wt 178.0 lb

## 2013-09-13 DIAGNOSIS — N6452 Nipple discharge: Secondary | ICD-10-CM

## 2013-09-13 DIAGNOSIS — R21 Rash and other nonspecific skin eruption: Secondary | ICD-10-CM

## 2013-09-13 DIAGNOSIS — N6459 Other signs and symptoms in breast: Secondary | ICD-10-CM

## 2013-09-13 DIAGNOSIS — R109 Unspecified abdominal pain: Secondary | ICD-10-CM

## 2013-09-13 LAB — POCT URINALYSIS DIPSTICK
Bilirubin, UA: NEGATIVE
Glucose, UA: NEGATIVE
Ketones, UA: NEGATIVE
Leukocytes, UA: NEGATIVE
Nitrite, UA: NEGATIVE
Protein, UA: NEGATIVE
Spec Grav, UA: 1.015
Urobilinogen, UA: NEGATIVE
pH, UA: 6

## 2013-09-13 LAB — POCT URINE PREGNANCY: Preg Test, Ur: NEGATIVE

## 2013-09-13 MED ORDER — TERBINAFINE HCL 1 % EX CREA: 1.0000 "application " | TOPICAL_CREAM | Freq: Two times a day (BID) | CUTANEOUS | Status: DC

## 2013-09-13 NOTE — Progress Notes (Signed)
  Subjective:  Katelyn Mcfarland is a 22 y.o. female who presents for multiple c/o.  She reports some abdominal pain below belly button, last period lighter than normal.   Implanon was taken out in August 2014.  Was having regular crampy periods, but most menstrual period 08/22/13, very light only lasted 3 days.  Had no cramping as usual.   She did have intercourse 2 days prior to cycle.  A few days before was having abdominal pressure and sharp pain if she drank alcohol.  She has been having some nausea and vomiting yesterday morning.   She did home pregnancy test the day after period, a week later, and one yesterday, all negative.  Denies vaginal discharge, no odor, no GU symptoms.  No fever, no diarrhea, no bowel changes, no constipation.  She is sensitive to smells, feels like she is losing weight, feels hungry often.  Having some recent nausea, worried about being pregnant.    Breast discharge - improved with keflex, but still has some mild clear discharge.  She notes ongoing discharge, clear, bilat since her last pregnancy.    Smokes 2-3 black and milds daily.  Trying to decrease this though.  Plans to go get Implanon.  Drinks alcohol on the weekend.    Rash -  Has rash on left arm.  Has had this since Implanon was put in 3+ years ago.   This continues, not particularly itchy.  Used nothing topically.    No other c/o.  The following portions of the patient's history were reviewed and updated as appropriate: allergies, current medications, past family history, past medical history, past social history, past surgical history and problem list.  ROS Otherwise as in subjective above  Objective: Physical Exam  BP 110/80  Pulse 90  Temp(Src) 98 F (36.7 C) (Oral)  Resp 16  Wt 178 lb (80.74 kg)  General appearance: alert, no distress, WD/WN Skin: several small round lesions of left upper medial arm with slight raised border, pink/red,  with somewhat dry and clearing centers Neck: supple, no lymphadenopathy, no thyromegaly, no masses Heart: RRR, normal S1, S2, no murmurs Lungs: CTA bilaterally, no wheezes, rhonchi, or rales Abdomen: +bs, soft, mild suprapubic tenderness, otherwise non tender, non distended, no masses, no hepatomegaly, no splenomegaly Pulses: 2+ radial pulses, 2+ pedal pulses, normal cap refill Ext: no edema Breasts: no obvious lump or discharge, no skin changes, no lymphadenopathy, exam chaperoned by nurse  KOH with squam only,no obvious yeasts  Assessment: Encounter Diagnoses  Name Primary?  . Abdominal pain, unspecified site Yes  . Nipple discharge   . Rash and nonspecific skin eruption      Plan: Abdominal pain - negative pregnancy.  Will send urine for culture given moderate blood.  Etiology unclear.   Nipple discharge - labs today, diagnostic mammogram ordered, and if she expresses any discharge, will collect as we discussed in pathology bottle for analysis.  Unable to express nipple discharge today. Rash - examined by me and Dr. Susann Givens.  Will treat for fungus although KOH prep negative.   Follow up: pending labs/studies.

## 2013-09-13 NOTE — Telephone Encounter (Signed)
tsd  °

## 2013-09-13 NOTE — Telephone Encounter (Signed)
Patient is coming on 09/13/13 for a OV. CLS

## 2013-09-14 LAB — TSH: TSH: 1.547 u[IU]/mL (ref 0.350–4.500)

## 2013-09-14 LAB — PROLACTIN: Prolactin: 8.4 ng/mL

## 2013-09-15 ENCOUNTER — Other Ambulatory Visit: Payer: Self-pay | Admitting: Medical

## 2013-09-15 LAB — URINE CULTURE: Colony Count: >=100000

## 2013-09-15 MED ORDER — CIPROFLOXACIN HCL 500 MG PO TABS: 500.0000 mg | ORAL_TABLET | Freq: Two times a day (BID) | ORAL | Status: DC

## 2013-09-16 ENCOUNTER — Other Ambulatory Visit: Payer: Self-pay | Admitting: Medical

## 2013-09-16 ENCOUNTER — Telehealth: Payer: Self-pay | Admitting: Medical

## 2013-09-16 ENCOUNTER — Telehealth: Payer: Self-pay | Admitting: Internal Medicine

## 2013-09-16 MED ORDER — NITROFURANTOIN MONOHYD MACRO 100 MG PO CAPS: 100.0000 mg | ORAL_CAPSULE | Freq: Two times a day (BID) | ORAL | Status: DC

## 2013-09-16 NOTE — Telephone Encounter (Signed)
Left message for pt regarding Shane's instructions

## 2013-09-16 NOTE — Telephone Encounter (Signed)
Pt states that the last time she took cipro it made her sick and that's what she thinks made her abdominal pain worse. She would like another antibotic send in to [facility] [address]. Also she would like to be referred to a Urologist as she states she keeps having blood in her urine and she wants a second opinion on it

## 2013-09-16 NOTE — Telephone Encounter (Signed)
Left message stating her last ov was sent over to referral

## 2013-09-16 NOTE — Telephone Encounter (Signed)
Different antibiotic sent.   Before we refer to urology, lets check clean catch urine in 2wk, preferably morning.   She had infection this time, which explains the blood.  So recheck 2wk, if still urine microscopic as well as micro + on microscopic exam, then referral will be made.

## 2013-10-25 ENCOUNTER — Encounter: Payer: Medicaid Other | Admitting: Medical

## 2013-11-01 ENCOUNTER — Encounter: Payer: Self-pay | Admitting: Medical

## 2013-11-01 ENCOUNTER — Ambulatory Visit (INDEPENDENT_AMBULATORY_CARE_PROVIDER_SITE_OTHER): Payer: Medicaid Other | Admitting: Medical

## 2013-11-01 ENCOUNTER — Other Ambulatory Visit (HOSPITAL_COMMUNITY)
Admission: RE | Admit: 2013-11-01 | Discharge: 2013-11-01 | Disposition: A | Discharge status: Home / Self Care | Payer: Medicaid Other | Source: Ambulatory Visit | Attending: Medical | Admitting: Medical

## 2013-11-01 VITALS — BP 118/70 | HR 84 | Temp 98.2°F | Resp 16 | Ht 60.0 in | Wt 174.0 lb

## 2013-11-01 DIAGNOSIS — F17200 Nicotine dependence, unspecified, uncomplicated: Secondary | ICD-10-CM

## 2013-11-01 DIAGNOSIS — N6459 Other signs and symptoms in breast: Secondary | ICD-10-CM

## 2013-11-01 DIAGNOSIS — G894 Chronic pain syndrome: Secondary | ICD-10-CM

## 2013-11-01 DIAGNOSIS — Z113 Encounter for screening for infections with a predominantly sexual mode of transmission: Secondary | ICD-10-CM | POA: Insufficient documentation

## 2013-11-01 DIAGNOSIS — Z124 Encounter for screening for malignant neoplasm of cervix: Secondary | ICD-10-CM | POA: Insufficient documentation

## 2013-11-01 DIAGNOSIS — Z0000 Encounter for general adult medical examination without abnormal findings: Secondary | ICD-10-CM

## 2013-11-01 DIAGNOSIS — R51 Headache: Secondary | ICD-10-CM

## 2013-11-01 DIAGNOSIS — N6452 Nipple discharge: Secondary | ICD-10-CM

## 2013-11-01 DIAGNOSIS — G8929 Other chronic pain: Secondary | ICD-10-CM

## 2013-11-01 LAB — CBC WITH DIFFERENTIAL/PLATELET
Basophils Absolute: 0 10*3/uL (ref 0.0–0.1)
Basophils Relative: 0 % (ref 0–1)
Eosinophils Absolute: 0.1 10*3/uL (ref 0.0–0.7)
Eosinophils Relative: 1 % (ref 0–5)
HCT: 40.4 % (ref 36.0–46.0)
Hemoglobin: 13.7 g/dL (ref 12.0–15.0)
Lymphocytes Relative: 27 % (ref 12–46)
Lymphs Abs: 2.2 10*3/uL (ref 0.7–4.0)
MCH: 29.7 pg (ref 26.0–34.0)
MCHC: 33.9 g/dL (ref 30.0–36.0)
MCV: 87.6 fL (ref 78.0–100.0)
Monocytes Absolute: 0.5 10*3/uL (ref 0.1–1.0)
Monocytes Relative: 7 % (ref 3–12)
Neutro Abs: 5.4 10*3/uL (ref 1.7–7.7)
Neutrophils Relative %: 65 % (ref 43–77)
Platelets: 212 10*3/uL (ref 150–400)
RBC: 4.61 MIL/uL (ref 3.87–5.11)
RDW: 13.7 % (ref 11.5–15.5)
WBC: 8.3 10*3/uL (ref 4.0–10.5)

## 2013-11-01 LAB — COMPREHENSIVE METABOLIC PANEL
ALT: 16 U/L (ref 0–35)
AST: 19 U/L (ref 0–37)
Albumin: 4.2 g/dL (ref 3.5–5.2)
Alkaline Phosphatase: 56 U/L (ref 39–117)
BUN: 7 mg/dL (ref 6–23)
CO2: 28 mEq/L (ref 19–32)
Calcium: 9 mg/dL (ref 8.4–10.5)
Chloride: 106 mEq/L (ref 96–112)
Creat: 0.77 mg/dL (ref 0.50–1.10)
Glucose, Bld: 85 mg/dL (ref 70–99)
Potassium: 4.2 mEq/L (ref 3.5–5.3)
Sodium: 141 mEq/L (ref 135–145)
Total Bilirubin: 0.2 mg/dL — ABNORMAL LOW (ref 0.3–1.2)
Total Protein: 7 g/dL (ref 6.0–8.3)

## 2013-11-01 LAB — SEDIMENTATION RATE: Sed Rate: 1 mm/hr (ref 0–22)

## 2013-11-01 MED ORDER — MELOXICAM 15 MG PO TABS: 15.0000 mg | ORAL_TABLET | Freq: Every day | ORAL | Status: DC

## 2013-11-01 MED ORDER — DULOXETINE HCL 30 MG PO CPEP: 30.0000 mg | ORAL_CAPSULE | Freq: Every day | ORAL | Status: DC

## 2013-11-01 NOTE — Progress Notes (Signed)
Subjective:   HPI  [name] is a 23 y.o. female who presents for a complete physical.  Concerns: Pain everywhere.  Back, neck, arms, at times right hand feels numb.  Hurts on a regular basis most days.  Been getting a lot of headache.  She still smokes but has dramatically decreased her quantity.  Only smoking a few cigarettes daily.  She is planning on getting Implanon again, but she wants to do her Pap today.  No joint swelling, no specific joint pains, mostly muscular.  No recent fall or injury or trauma.  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 She does report some stress, wonders if this is more related to her mood.  Was on Zoloft in her teenage years. Doesn't necessarily feel depressed but does feel under stress.    Reviewed their medical, surgical, family, social, medication, and allergy history and updated chart as appropriate.  Past Medical History  Diagnosis Date  . High cholesterol   . Chlamydia     age 23yo; hx/o trichomonas age 23yo  . Chronic headache   . Depression   . Allergy     Past Surgical History  Procedure Laterality Date  . Wisdom tooth extraction      History   Social History  . Marital Status: Single    Spouse Name: N/A    Number of Children: N/A  . Years of Education: N/A   Occupational History  . student [employer]   Social History Main Topics  . Smoking status: Current Every Day Smoker -- 1.00 packs/day for 12 years    Types: Cigarettes  . Smokeless tobacco: Not on file     Comment: quit x 3 wk as of 03/2012  . Alcohol Use: Yes     Comment: Occasionally  . Drug Use: No  . Sexual Activity: Yes    Birth Control/ Protection: Implant   Other Topics Concern  . Not on file   Social History Narrative   Single, 2 children, exercise - none.  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 Hypertension Mother   .  Diabetes Mother   . Asthma Sister   . Cancer Paternal Grandmother   . Cancer Paternal Grandfather   . Heart disease Neg Hx   . Stroke Neg Hx     Current outpatient prescriptions:ibuprofen (ADVIL,MOTRIN) 200 MG tablet, Take 200 mg by mouth every 6 (six) hours as needed., Disp: , Rfl: ;  Multiple Vitamins-Minerals (MULTIVITAMIN WITH MINERALS) tablet, Take 1 tablet by mouth daily., Disp: , Rfl: ;  DULoxetine (CYMBALTA) 30 MG capsule, Take 1 capsule (30 mg total) by mouth daily., Disp: 30 capsule, Rfl: 2 meloxicam (MOBIC) 15 MG tablet, Take 1 tablet (15 mg total) by mouth daily., Disp: 30 tablet, Rfl: 0;  nitrofurantoin, macrocrystal-monohydrate, (MACROBID) 100 MG capsule, Take 1 capsule (100 mg total) by mouth 2 (two) times daily., Disp: 14 capsule, Rfl: 0;  terbinafine (LAMISIL AT) 1 % cream, Apply 1 application topically 2 (two) times daily., Disp: 42 g, Rfl: 0  No Known Allergies   Review of Systems Constitutional: -fever, -chills, -sweats, -unexpected weight change, -decreased appetite, +fatigue Allergy: -sneezing, -itching, -congestion Dermatology: -changing moles, --rash, -lumps ENT: -runny nose, -ear pain, -sore throat, -hoarseness, -sinus pain, -teeth pain, - ringing in ears, -hearing loss, -nosebleeds Cardiology: -chest pain, -palpitations, -swelling, -difficulty breathing when lying  flat, -waking up short of breath Respiratory: -cough, -shortness of breath, -difficulty breathing with exercise or exertion, -wheezing, -coughing up blood Gastroenterology: -abdominal pain, -nausea, -vomiting, -diarrhea, -constipation, -blood in stool, -changes in bowel movement, -difficulty swallowing or eating Hematology: -bleeding, -bruising  Musculoskeletal: -joint aches,+muscle aches, -joint swelling, +back pain, +neck pain, -cramping, -changes in gait Ophthalmology: denies vision changes, eye redness, itching, discharge Urology: -burning with urination, -difficulty urinating, -blood in urine,  -urinary frequency, -urgency, -incontinence Neurology: +headache, -weakness, -tingling, -numbness, -memory loss, -falls, -dizziness Psychology: -depressed mood, -agitation, +sleep problems     Objective:   Physical Exam  Filed Vitals:   11/01/13 1431  BP: 118/70  Pulse: 84  Temp: 98.2 F (36.8 C)  Resp: 16    General appearance: alert, no distress, WD/WN, AA female Skin: few scattered benigna macules, no worrisome lesions, tattoos bilat arms HEENT: normocephalic, conjunctiva/corneas normal, sclerae anicteric, PERRLA, EOMi, nares patent, no discharge or erythema, pharynx normal Oral cavity: MMM, tongue normal, teeth in good repair Neck: supple, no lymphadenopathy, no thyromegaly, no masses, normal ROM, no bruits Chest: non tender, normal shape and expansion Heart: RRR, normal S1, S2, no murmurs Lungs: CTA bilaterally, no wheezes, rhonchi, or rales Abdomen: +bs, soft, non tender, non distended, no masses, no hepatomegaly, no splenomegaly, no bruits Back: non tender, normal ROM, no scoliosis Musculoskeletal: seems to be in pain, but on exam, no deformity, nontender, upper extremities non tender, no obvious deformity, normal ROM throughout, lower extremities non tender, no obvious deformity, normal ROM throughout Extremities: no edema, no cyanosis, no clubbing Pulses: 2+ symmetric, upper and lower extremities, normal cap refill Neurological: alert, oriented x 3, CN2-12 intact, strength normal upper extremities and lower extremities, sensation normal throughout, DTRs 2+ throughout, no cerebellar signs, gait normal Psychiatric: normal affect, behavior normal, pleasant  Breast: small palpable tender lymph node in left axilla, othewrise nontender, no masses or lumps, no skin changes, no nipple discharge or inversion, no axillary lymphadenopathy Gyn: Normal external genitalia without lesions, vagina with normal mucosa, cervix without lesions, no cervical motion tenderness, no abnormal vaginal  discharge.  Uterus and adnexa not enlarged, nontender, no masses.  Pap performed.  Exam chaperoned by nurse. Rectal: deferred    Assessment and Plan :    Encounter Diagnoses  Name Primary?  . Routine general medical examination at a health care facility Yes  . Chronic pain syndrome   . Chronic headaches   . Tobacco use disorder   . Nipple discharge   . Screening for cervical cancer   . Screen for STD (sexually transmitted disease)     Physical exam - discussed healthy lifestyle, diet, exercise, preventative care, vaccinations, and addressed their concerns.  Handout given. Chronic pain, chronic headaches - etiology unclear, possibly psychosomatic. Discussed the role tobacco as inflammatory state.  Begin trial of Cymbalta daily, and for the next couple weeks can use Mobic once daily. Discussed risk and benefits of medication, she agrees to trial medication Tobacco use - advised she stop tobacco completley, just smoking a few daily Nipple discharge - resolved Pap sent, STD screening today, discussed safe sex Follow-up pending labs

## 2013-11-02 ENCOUNTER — Other Ambulatory Visit: Payer: Self-pay | Admitting: Medical

## 2013-11-02 ENCOUNTER — Telehealth: Payer: Self-pay | Admitting: Family Medicine

## 2013-11-02 LAB — ANA: Anti Nuclear Antibody(ANA): NEGATIVE

## 2013-11-02 LAB — VITAMIN B12: Vitamin B-12: 492 pg/mL (ref 211–911)

## 2013-11-02 LAB — HIV ANTIBODY (ROUTINE TESTING W REFLEX): HIV: NONREACTIVE

## 2013-11-02 LAB — TSH: TSH: 2.091 u[IU]/mL (ref 0.350–4.500)

## 2013-11-02 LAB — RPR

## 2013-11-02 MED ORDER — SERTRALINE HCL 25 MG PO TABS: 25.0000 mg | ORAL_TABLET | Freq: Every day | ORAL | Status: DC

## 2013-11-02 MED ORDER — NAPROXEN 375 MG PO TABS: 375.0000 mg | ORAL_TABLET | Freq: Two times a day (BID) | ORAL | Status: DC

## 2013-11-02 MED ORDER — TRAMADOL HCL 50 MG PO TABS: 50.0000 mg | ORAL_TABLET | Freq: Four times a day (QID) | ORAL | Status: DC | PRN

## 2013-11-02 NOTE — Telephone Encounter (Signed)
PT aware rx phoned in

## 2013-11-02 NOTE — Telephone Encounter (Signed)
Call out Ultram.  These call patient and let her know that I called out 3 medications to the pharmacy as alternates.  Have her begin Naprosyn 375 mg twice daily as needed for pain and inflammation. Since the Cymbalta was not covered, have her begin Zoloft instead.  If her pain is severe, she can also take Ultram as needed, but this should be used only for worse pain  I will await the Cymbalta prior authorization.

## 2013-11-02 NOTE — Telephone Encounter (Signed)
Pt called once again and stated that she needs something for pain today. She cant afford mobic and cymbalta requires prior auth. Please send in something until this pt's issues with the meds can be resolved. [facility] [address]

## 2013-11-02 NOTE — Telephone Encounter (Signed)
PT called and states the Cymbalta had a prior authorization and Mobic was 128.00  So she did not get either one.  Please advise pt [phone].    Pharm [facility] [address]

## 2013-11-03 ENCOUNTER — Telehealth: Payer: Self-pay | Admitting: Medical

## 2013-11-14 NOTE — Telephone Encounter (Signed)
[facility] Cymbalta not approved, brand is only covered.  Called phamracy & they are getting rejection that pt has additional coverage.  Called pt & left her a message.  She needs to call her case worker & inform no other coverage.

## 2013-11-15 ENCOUNTER — Encounter: Payer: Self-pay | Admitting: Internal Medicine

## 2013-11-15 LAB — HM MAMMOGRAPHY

## 2013-11-15 NOTE — Telephone Encounter (Signed)
lm

## 2013-11-16 ENCOUNTER — Telehealth: Payer: Self-pay | Admitting: Medical

## 2013-11-16 NOTE — Telephone Encounter (Signed)
Not sure I know what you are talking about?  Please clarify, what bleeding?  From what location?  Please get more info when these types of calls come in.

## 2013-11-16 NOTE — Telephone Encounter (Signed)
Please call patient  She has questions about the internal bleeding and how long it is going to last

## 2013-11-17 ENCOUNTER — Other Ambulatory Visit: Payer: Self-pay | Admitting: Medical

## 2013-11-17 NOTE — Telephone Encounter (Signed)
Take 800 mg of ibuprofen 3 times per day and followup with Katelyn Mcfarland concerning switching to a different medicine

## 2013-11-17 NOTE — Telephone Encounter (Signed)
She is going to take her Naprosyn BID x 5 days, and will call me Tuesday.  She will also wean off Cymbalta as we discussed.  She has colposcopy next week and get Nexplanon next week too

## 2013-11-17 NOTE — Telephone Encounter (Signed)
Pt called and stated that she continues to bleed vaginally like a full cycle. She states she took one pill of cymbalta and the bleeding began. She states it is too early for her regular cycle. She states she looked up side effects from cymbalta and it states unusual bleeding. She also states that she went to see he gyn yesterday, Dr. Gaynell FaceMarshall, and he told her to stop taking the medication and see if bleeding stopped and to get in touch with us. As of now bleeding has not stopped. She states that she only took one pill day before yesterday. I am sending this note to [facility] because Vincenza HewsShane is not in today. Please call pt and inform of what she needs to do. Pt can be reached at [phone].

## 2013-11-30 ENCOUNTER — Encounter (HOSPITAL_COMMUNITY): Payer: Self-pay | Admitting: Emergency Medicine

## 2013-11-30 DIAGNOSIS — A499 Bacterial infection, unspecified: Secondary | ICD-10-CM | POA: Insufficient documentation

## 2013-11-30 DIAGNOSIS — F3289 Other specified depressive episodes: Secondary | ICD-10-CM | POA: Insufficient documentation

## 2013-11-30 DIAGNOSIS — Z79899 Other long term (current) drug therapy: Secondary | ICD-10-CM | POA: Insufficient documentation

## 2013-11-30 DIAGNOSIS — N760 Acute vaginitis: Secondary | ICD-10-CM | POA: Insufficient documentation

## 2013-11-30 DIAGNOSIS — N949 Unspecified condition associated with female genital organs and menstrual cycle: Secondary | ICD-10-CM | POA: Insufficient documentation

## 2013-11-30 DIAGNOSIS — Z792 Long term (current) use of antibiotics: Secondary | ICD-10-CM | POA: Insufficient documentation

## 2013-11-30 DIAGNOSIS — F17200 Nicotine dependence, unspecified, uncomplicated: Secondary | ICD-10-CM | POA: Insufficient documentation

## 2013-11-30 DIAGNOSIS — Z862 Personal history of diseases of the blood and blood-forming organs and certain disorders involving the immune mechanism: Secondary | ICD-10-CM | POA: Insufficient documentation

## 2013-11-30 DIAGNOSIS — B9689 Other specified bacterial agents as the cause of diseases classified elsewhere: Secondary | ICD-10-CM | POA: Insufficient documentation

## 2013-11-30 DIAGNOSIS — Z8639 Personal history of other endocrine, nutritional and metabolic disease: Secondary | ICD-10-CM | POA: Insufficient documentation

## 2013-11-30 DIAGNOSIS — Z3202 Encounter for pregnancy test, result negative: Secondary | ICD-10-CM | POA: Insufficient documentation

## 2013-11-30 DIAGNOSIS — Z791 Long term (current) use of non-steroidal anti-inflammatories (NSAID): Secondary | ICD-10-CM | POA: Insufficient documentation

## 2013-11-30 DIAGNOSIS — F329 Major depressive disorder, single episode, unspecified: Secondary | ICD-10-CM | POA: Insufficient documentation

## 2013-11-30 LAB — URINALYSIS, ROUTINE W REFLEX MICROSCOPIC
Bilirubin Urine: NEGATIVE
Glucose, UA: NEGATIVE mg/dL
Ketones, ur: NEGATIVE mg/dL
Leukocytes, UA: NEGATIVE
Nitrite: NEGATIVE
Protein, ur: NEGATIVE mg/dL
Specific Gravity, Urine: 1.015 (ref 1.005–1.030)
Urobilinogen, UA: 0.2 mg/dL (ref 0.0–1.0)
pH: 6 (ref 5.0–8.0)

## 2013-11-30 LAB — CBC WITH DIFFERENTIAL/PLATELET
Basophils Absolute: 0 10*3/uL (ref 0.0–0.1)
Basophils Relative: 0 % (ref 0–1)
Eosinophils Absolute: 0.1 10*3/uL (ref 0.0–0.7)
Eosinophils Relative: 1 % (ref 0–5)
HCT: 39.7 % (ref 36.0–46.0)
Hemoglobin: 13.7 g/dL (ref 12.0–15.0)
Lymphocytes Relative: 38 % (ref 12–46)
Lymphs Abs: 3.4 10*3/uL (ref 0.7–4.0)
MCH: 30.5 pg (ref 26.0–34.0)
MCHC: 34.5 g/dL (ref 30.0–36.0)
MCV: 88.4 fL (ref 78.0–100.0)
Monocytes Absolute: 0.5 10*3/uL (ref 0.1–1.0)
Monocytes Relative: 5 % (ref 3–12)
Neutro Abs: 5.1 10*3/uL (ref 1.7–7.7)
Neutrophils Relative %: 56 % (ref 43–77)
Platelets: 216 10*3/uL (ref 150–400)
RBC: 4.49 MIL/uL (ref 3.87–5.11)
RDW: 13.3 % (ref 11.5–15.5)
WBC: 9.1 10*3/uL (ref 4.0–10.5)

## 2013-11-30 LAB — URINE MICROSCOPIC-ADD ON

## 2013-11-30 LAB — POCT PREGNANCY, URINE: Preg Test, Ur: NEGATIVE

## 2013-11-30 NOTE — ED Notes (Signed)
Pt c/o lower mid sharp abdominal pain x days with nausea. Denies vomiting. LBM 11/30/2013/. LMP 10/28/2013. Denies vaginal discharge. States vaginal odor. Hx UTI.

## 2013-12-01 ENCOUNTER — Emergency Department (HOSPITAL_COMMUNITY)
Admission: EM | Admit: 2013-12-01 | Discharge: 2013-12-01 | Disposition: A | Discharge status: Home / Self Care | Payer: Medicaid Other | Attending: Emergency Medicine | Admitting: Emergency Medicine

## 2013-12-01 DIAGNOSIS — N760 Acute vaginitis: Secondary | ICD-10-CM

## 2013-12-01 DIAGNOSIS — R102 Pelvic and perineal pain: Secondary | ICD-10-CM

## 2013-12-01 DIAGNOSIS — B9689 Other specified bacterial agents as the cause of diseases classified elsewhere: Secondary | ICD-10-CM

## 2013-12-01 LAB — COMPREHENSIVE METABOLIC PANEL
ALT: 16 U/L (ref 0–35)
AST: 22 U/L (ref 0–37)
Albumin: 4.2 g/dL (ref 3.5–5.2)
Alkaline Phosphatase: 59 U/L (ref 39–117)
BUN: 10 mg/dL (ref 6–23)
CO2: 26 mEq/L (ref 19–32)
Calcium: 9.1 mg/dL (ref 8.4–10.5)
Chloride: 101 mEq/L (ref 96–112)
Creatinine, Ser: 0.95 mg/dL (ref 0.50–1.10)
GFR calc Af Amer: >90 mL/min (ref >90)
GFR calc non Af Amer: 83 mL/min — ABNORMAL LOW (ref >90)
Glucose, Bld: 84 mg/dL (ref 70–99)
Potassium: 3.7 mEq/L (ref 3.7–5.3)
Sodium: 139 mEq/L (ref 137–147)
Total Bilirubin: <0.2 mg/dL — ABNORMAL LOW (ref 0.3–1.2)
Total Protein: 7.8 g/dL (ref 6.0–8.3)

## 2013-12-01 LAB — WET PREP, GENITAL
Trich, Wet Prep: NONE SEEN
WBC, Wet Prep HPF POC: NONE SEEN
Yeast Wet Prep HPF POC: NONE SEEN

## 2013-12-01 LAB — GC/CHLAMYDIA PROBE AMP
CT Probe RNA: NEGATIVE
GC Probe RNA: NEGATIVE

## 2013-12-01 LAB — RPR: RPR Ser Ql: NONREACTIVE

## 2013-12-01 MED ORDER — IBUPROFEN 600 MG PO TABS: 600.0000 mg | ORAL_TABLET | Freq: Four times a day (QID) | ORAL | Status: DC | PRN

## 2013-12-01 MED ORDER — IBUPROFEN 800 MG PO TABS
800.0000 mg | ORAL_TABLET | Freq: Once | ORAL | Status: AC
  Administered 2013-12-01: 800 mg via ORAL
  Filled 2013-12-01: qty 1

## 2013-12-01 MED ORDER — METRONIDAZOLE 500 MG PO TABS: 500.0000 mg | ORAL_TABLET | Freq: Two times a day (BID) | ORAL | Status: DC

## 2013-12-01 NOTE — ED Provider Notes (Signed)
CSN: [id number]     Arrival date & time 11/30/13  2240 History   First MD Initiated Contact with Patient 12/01/13 0103     Chief Complaint  Patient presents with  . Abdominal Pain     (Consider location/radiation/quality/duration/timing/severity/associated sxs/prior Treatment) HPI Patient is a 23 yo woman with a history of cervicitis/PID. She presents with midline lower abdominal pain x 5 days. Pain is cramping and mild to moderate in severity without radiation. Patient denies excacerbating and relieving sx. No fever. No vaginal discharge. Unusual odor present. Denies dysuria. LMP Oct 28, 2013.   Past Medical History  Diagnosis Date  . High cholesterol   . Chlamydia     age 22yo; hx/o trichomonas age 75yo  . Chronic headache   . Depression   . Allergy    Past Surgical History  Procedure Laterality Date  . Wisdom tooth extraction     Family History  Problem Relation Age of Onset  . Hypertension Mother   . Diabetes Mother   . Asthma Sister   . Cancer Paternal Grandmother   . Cancer Paternal Grandfather   . Heart disease Neg Hx   . Stroke Neg Hx    History  Substance Use Topics  . Smoking status: Current Every Day Smoker -- 1.00 packs/day for 12 years    Types: Cigarettes  . Smokeless tobacco: Never Used     Comment: quit x 3 wk as of 03/2012  . Alcohol Use: Yes     Comment: Occasionally   OB History   Grav Para Term Preterm Abortions TAB SAB Ect Mult Living                 Review of Systems Ten point review of symptoms performed and is negative with the exception of symptoms noted above.     Allergies  Review of patient's allergies indicates no known allergies.  Home Medications   Current Outpatient Rx  Name  Route  Sig  Dispense  Refill  . DULoxetine (CYMBALTA) 30 MG capsule   Oral   Take 1 capsule (30 mg total) by mouth daily.   30 capsule   2   . meloxicam (MOBIC) 15 MG tablet   Oral   Take 1 tablet (15 mg total) by mouth daily.   30 tablet    0   . Multiple Vitamins-Minerals (MULTIVITAMIN WITH MINERALS) tablet   Oral   Take 1 tablet by mouth daily.         . naproxen (NAPROSYN) 375 MG tablet   Oral   Take 1 tablet (375 mg total) by mouth 2 (two) times daily with a meal.   30 tablet   1   . nitrofurantoin, macrocrystal-monohydrate, (MACROBID) 100 MG capsule   Oral   Take 1 capsule (100 mg total) by mouth 2 (two) times daily.   14 capsule   0   . sertraline (ZOLOFT) 25 MG tablet   Oral   Take 1 tablet (25 mg total) by mouth daily.   30 tablet   2   . terbinafine (LAMISIL AT) 1 % cream   Topical   Apply 1 application topically 2 (two) times daily.   42 g   0   . traMADol (ULTRAM) 50 MG tablet   Oral   Take 1 tablet (50 mg total) by mouth every 6 (six) hours as needed.   20 tablet   0    BP 126/91  Pulse 83  Temp(Src) 98.6 F (37 C) (  Oral)  Resp 16  Ht 5\' 2"  (1.575 m)  Wt 177 lb (80.287 kg)  BMI 32.37 kg/m2  SpO2 100%  LMP 10/28/2013 Physical Exam Gen: well developed and well nourished appearing Head: NCAT Eyes: PERL, EOMI Nose: no epistaixis or rhinorrhea Mouth/throat: mucosa is moist and pink Neck: supple, no stridor Lungs: CTA B, no wheezing, rhonchi or rales CV: RRR, no murmur, extremities appear well perfused.  Abd: soft, notender, nondistended\ Pelvic exam: normal female external genitalia, normal vagina with scant amount of whitish grey discharge, normal appearing cervix, no CMT, no adnexal ttp.  Back: no ttp, no cva ttp Skin: warm and dry Ext: normal to inspection, no dependent edema Neuro: CN ii-xii grossly intact, no focal deficits Psyche; normal affect,  calm and cooperative.   ED Course  Procedures (including critical care time) Labs Review  Results for orders placed during the hospital encounter of 12/01/13 (from the past 24 hour(s))  URINALYSIS, ROUTINE W REFLEX MICROSCOPIC     Status: Abnormal   Collection Time    11/30/13 10:59 PM      Result Value Ref Range   Color,  Urine YELLOW  YELLOW   APPearance CLEAR  CLEAR   Specific Gravity, Urine 1.015  1.005 - 1.030   pH 6.0  5.0 - 8.0   Glucose, UA NEGATIVE  NEGATIVE mg/dL   Hgb urine dipstick TRACE (*) NEGATIVE   Bilirubin Urine NEGATIVE  NEGATIVE   Ketones, ur NEGATIVE  NEGATIVE mg/dL   Protein, ur NEGATIVE  NEGATIVE mg/dL   Urobilinogen, UA 0.2  0.0 - 1.0 mg/dL   Nitrite NEGATIVE  NEGATIVE   Leukocytes, UA NEGATIVE  NEGATIVE  URINE MICROSCOPIC-ADD ON     Status: Abnormal   Collection Time    11/30/13 10:59 PM      Result Value Ref Range   Squamous Epithelial / LPF FEW (*) RARE   RBC / HPF 0-2  <3 RBC/hpf   Bacteria, UA RARE  RARE  POCT PREGNANCY, URINE     Status: None   Collection Time    11/30/13 11:02 PM      Result Value Ref Range   Preg Test, Ur NEGATIVE  NEGATIVE  CBC WITH DIFFERENTIAL     Status: None   Collection Time    11/30/13 11:35 PM      Result Value Ref Range   WBC 9.1  4.0 - 10.5 K/uL   RBC 4.49  3.87 - 5.11 MIL/uL   Hemoglobin 13.7  12.0 - 15.0 g/dL   HCT 16.1  09.6 - 04.5 %   MCV 88.4  78.0 - 100.0 fL   MCH 30.5  26.0 - 34.0 pg   MCHC 34.5  30.0 - 36.0 g/dL   RDW 40.9  81.1 - 91.4 %   Platelets 216  150 - 400 K/uL   Neutrophils Relative % 56  43 - 77 %   Neutro Abs 5.1  1.7 - 7.7 K/uL   Lymphocytes Relative 38  12 - 46 %   Lymphs Abs 3.4  0.7 - 4.0 K/uL   Monocytes Relative 5  3 - 12 %   Monocytes Absolute 0.5  0.1 - 1.0 K/uL   Eosinophils Relative 1  0 - 5 %   Eosinophils Absolute 0.1  0.0 - 0.7 K/uL   Basophils Relative 0  0 - 1 %   Basophils Absolute 0.0  0.0 - 0.1 K/uL  COMPREHENSIVE METABOLIC PANEL     Status: Abnormal   Collection  Time    11/30/13 11:35 PM      Result Value Ref Range   Sodium 139  137 - 147 mEq/L   Potassium 3.7  3.7 - 5.3 mEq/L   Chloride 101  96 - 112 mEq/L   CO2 26  19 - 32 mEq/L   Glucose, Bld 84  70 - 99 mg/dL   BUN 10  6 - 23 mg/dL   Creatinine, Ser 1.610.95  0.50 - 1.10 mg/dL   Calcium 9.1  8.4 - 09.610.5 mg/dL   Total Protein 7.8   6.0 - 8.3 g/dL   Albumin 4.2  3.5 - 5.2 g/dL   AST 22  0 - 37 U/L   ALT 16  0 - 35 U/L   Alkaline Phosphatase 59  39 - 117 U/L   Total Bilirubin <0.2 (*) 0.3 - 1.2 mg/dL   GFR calc non Af Amer 83 (*) >90 mL/min   GFR calc Af Amer >90  >90 mL/min    MDM   DDX: cervicitis, pid, complication of pregnancy, UTI, functional abdominal pain, endometriosis.   0335: Abdominal exam and VS remain benign. Wet prep notable for clue cells. We will tx with Flagyl for BV. Patient is counseled to f/u with her PCP. Counseled re: return precautions.     Brandt LoosenJulie Manly, MD 12/01/13 [phone]

## 2013-12-05 ENCOUNTER — Encounter: Payer: Self-pay | Admitting: Medical

## 2013-12-05 ENCOUNTER — Ambulatory Visit (INDEPENDENT_AMBULATORY_CARE_PROVIDER_SITE_OTHER): Payer: Medicaid Other | Admitting: Medical

## 2013-12-05 VITALS — BP 110/70 | HR 80 | Temp 98.1°F | Resp 16 | Wt 174.0 lb

## 2013-12-05 DIAGNOSIS — J069 Acute upper respiratory infection, unspecified: Secondary | ICD-10-CM

## 2013-12-05 DIAGNOSIS — J029 Acute pharyngitis, unspecified: Secondary | ICD-10-CM

## 2013-12-05 LAB — POCT RAPID STREP A (OFFICE): Rapid Strep A Screen: NEGATIVE

## 2013-12-05 NOTE — Progress Notes (Signed)
Subjective:  Katelyn Mcfarland is a 23 y.o. female who presents for evaluation of sore throat.  She has not had a recent close exposure to someone with proven streptococcal pharyngitis.  Associated symptoms include 2-3 days of sore throat, stuffy head, feels like a migraine, behind left eye.  Lymph nodes on the left hurt, both ears hurt.   Has cough, some sinus pressure.  Denies fever, NVD.  Treatment to date: OTC gel caps for congestion.  No sick contacts.  No other aggravating or relieving factors.  No other c/o.  The following portions of the patient's history were reviewed and updated as appropriate: allergies, current medications, past medical history, past social history, past surgical history and problem list.  ROS as in subjective   Objective: Filed Vitals:   12/05/13 1339  BP: 110/70  Pulse: 80  Temp: 98.1 F (36.7 C)  Resp: 16    General appearance: no distress, WD/WN, somewhat ill-appearing HEENT: normocephalic, conjunctiva/corneas normal, sclerae anicteric, nares with swollen turbinates, clear discharge + erythema, pharynx with erythema, no exudate.  Oral cavity: MMM, no lesions  Neck: supple, shoddy tender left sided nodes, no thyromegaly Heart: RRR, normal S1, S2, no murmurs Lungs: CTA bilaterally, no wheezes, rhonchi, or rales   Laboratory Strep test done. Results:negative.    Assessment and Plan: Encounter Diagnoses  Name Primary?  Marland Kitchen. Upper respiratory infection Yes  . Sore throat     Advised that symptoms and exam suggest a viral URI etiology.  Discussed symptomatic treatment including salt water gargles, warm fluids, rest, hydrate well, can use over-the-counter Ibuprofen for throat pain, fever, or malaise. If worse or not improving within 2-3 days, call or return.

## 2013-12-05 NOTE — Addendum Note (Signed)
Addended by: Janeice RobinsonSCALES, [place]me[place] Filice L on: 12/05/2013 02:59 PM   Modules accepted: Orders

## 2013-12-05 NOTE — Patient Instructions (Signed)
Upper Respiratory Infection, Adult An upper respiratory infection (URI) is also known as the common cold. It is often caused by a type of germ (virus). Colds are easily spread (contagious). You can pass it to others by kissing, coughing, sneezing, or drinking out of the same glass. Usually, you get better in 1 or 2 weeks.  HOME CARE   Only take medicine as told by your doctor.   Use a warm mist humidifier or breathe in steam from a hot shower.   Drink enough water and fluids to keep your pee (urine) clear or pale yellow.   Get plenty of rest.   Return to work when your temperature is back to normal or as told by your doctor. You may use a face mask and wash your hands to stop your cold from spreading.   You can use OTC Robitussin DM for cough and congestion  Use Ibuprofen 3 times daily the next few days for headache and sore throat GET HELP RIGHT AWAY IF:   After the first few days, you feel you are getting worse.   You have questions about your medicine.   You have chills, shortness of breath, or brown or red spit (mucus).   You have yellow or brown snot (nasal discharge) or pain in the face, especially when you bend forward.   You have a fever, puffy (swollen) neck, pain when you swallow, or white spots in the back of your throat.   You have a bad headache, ear pain, sinus pain, or chest pain.   You have a high-pitched whistling sound when you breathe in and out (wheezing).   You have a lasting cough or cough up blood.   You have sore muscles or a stiff neck.  MAKE SURE YOU:   Understand these instructions.   Will watch your condition.   Will get help right away if you are not doing well or get worse.  Document Released: 03/17/2008 Document Revised: 06/11/2011 Document Reviewed: 02/03/2011 [facility] Patient Information 2012 [place].  Pharyngitis, Viral and Bacterial Pharyngitis is soreness (inflammation) or infection of the pharynx. It is also called a sore  throat. CAUSES  Most sore throats are caused by viruses and are part of a cold. However, some sore throats are caused by strep and other bacteria. Sore throats can also be caused by post nasal drip from draining sinuses, allergies and sometimes from sleeping with an open mouth. Infectious sore throats can be spread from person to person by coughing, sneezing and sharing cups or eating utensils. TREATMENT  Sore throats that are viral usually last 3-4 days. Viral illness will get better without medications (antibiotics). Strep throat and other bacterial infections will usually begin to get better about 24-48 hours after you begin to take antibiotics. HOME CARE INSTRUCTIONS   If the caregiver feels there is a bacterial infection or if there is a positive strep test, they will prescribe an antibiotic. The full course of antibiotics must be taken. If the full course of antibiotic is not taken, you or your child may become ill again. If you or your child has strep throat and do not finish all of the medication, serious heart or kidney diseases may develop.   Drink enough water and fluids to keep your urine clear or pale yellow.   Only take over-the-counter or prescription medicines for pain, discomfort or fever as directed by your caregiver.   Get lots of rest.   Gargle with salt water ( tsp. of salt  in a glass of water) as often as every 1-2 hours as you need for comfort.   Hard candies may soothe the throat if individual is not at risk for choking. Throat sprays or lozenges may also be used.  SEEK MEDICAL CARE IF:   Large, tender lumps in the neck develop.   A rash develops.   Green, yellow-brown or bloody sputum is coughed up.   Your baby is older than 3 months with a rectal temperature of 100.5 F (38.1 C) or higher for more than 1 day.  SEEK IMMEDIATE MEDICAL CARE IF:   A stiff neck develops.   You or your child are drooling or unable to swallow liquids.   You or your child are  vomiting, unable to keep medications or liquids down.   You or your child has severe pain, unrelieved with recommended medications.   You or your child are having difficulty breathing (not due to stuffy nose).   You or your child are unable to fully open your mouth.   You or your child develop redness, swelling, or severe pain anywhere on the neck.   You have a fever.   Your baby is older than 3 months with a rectal temperature of 102 F (38.9 C) or higher.   Your baby is 69 months old or younger with a rectal temperature of 100.4 F (38 C) or higher.  MAKE SURE YOU:   Understand these instructions.   Will watch your condition.   Will get help right away if you are not doing well or get worse.  Document Released: 09/29/2005 Document Revised: 06/11/2011 Document Reviewed: 12/27/2007 [facility] Patient Information 2012 [place], [place].

## 2013-12-29 ENCOUNTER — Telehealth: Payer: Self-pay | Admitting: Internal Medicine

## 2013-12-29 NOTE — Telephone Encounter (Signed)
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

## 2013-12-29 NOTE — Telephone Encounter (Signed)
No, the gynecologist should give her f/u info, so have her call them back regarding next f/u.

## 2013-12-29 NOTE — Telephone Encounter (Signed)
Pt.notified

## 2014-01-03 ENCOUNTER — Encounter: Payer: Self-pay | Admitting: Internal Medicine

## 2014-01-03 LAB — HM MAMMOGRAPHY

## 2014-01-11 ENCOUNTER — Ambulatory Visit (INDEPENDENT_AMBULATORY_CARE_PROVIDER_SITE_OTHER): Payer: Medicaid Other | Admitting: Family Medicine

## 2014-01-11 ENCOUNTER — Encounter: Payer: Self-pay | Admitting: Family Medicine

## 2014-01-11 VITALS — BP 102/70 | HR 68 | Temp 98.3°F | Wt 173.0 lb

## 2014-01-11 DIAGNOSIS — F17200 Nicotine dependence, unspecified, uncomplicated: Secondary | ICD-10-CM

## 2014-01-11 DIAGNOSIS — Z87891 Personal history of nicotine dependence: Secondary | ICD-10-CM | POA: Insufficient documentation

## 2014-01-11 DIAGNOSIS — L84 Corns and callosities: Secondary | ICD-10-CM

## 2014-01-11 DIAGNOSIS — J029 Acute pharyngitis, unspecified: Secondary | ICD-10-CM

## 2014-01-11 LAB — POCT RAPID STREP A (OFFICE): Rapid Strep A Screen: NEGATIVE

## 2014-01-11 NOTE — Patient Instructions (Signed)
Call [phone] quit now 

## 2014-01-11 NOTE — Progress Notes (Signed)
   Subjective:    Patient ID: Katelyn Mcfarland, female    DOB: Mar 28, 1991, 23 y.o.   MRN: [id number]  HPI 4 days ago she developed a sore throat and slight earache, cough. No fever, chills, nausea PND She does smoke. She also has a lesion between her left fourth and fifth toes it is discolored.   Review of Systems     Objective:   Physical Exam alert and in no distress. Tympanic membranes and canals are normal. Throat is clear. Tonsils are normal. Neck is supple without adenopathy or thyromegaly. Cardiac exam shows a regular sinus rhythm without murmurs or gallops. Lungs are clear to auscultation. Exam of the left fourth toe does show slightly brownish round slightly tender lesion directly with fifth toe abuts fourth toe.       Assessment & Plan:  Corn of toe  Acute pharyngitis - Plan: Rapid Strep A  Current smoker  recommend a spacer for the cord to help take pressure off of it. Supportive care for the pharyngitis/bronchitis. Discussed smoking cessation especially in regard to stress since this is a major factor. Gave her the 800 number to call. She is to return here as needed for help with the smoking.

## 2014-01-31 ENCOUNTER — Ambulatory Visit (INDEPENDENT_AMBULATORY_CARE_PROVIDER_SITE_OTHER): Payer: Medicaid Other | Admitting: Family Medicine

## 2014-01-31 ENCOUNTER — Encounter: Payer: Self-pay | Admitting: Family Medicine

## 2014-01-31 VITALS — BP 110/80 | HR 80 | Wt 172.0 lb

## 2014-01-31 DIAGNOSIS — IMO0002 Reserved for concepts with insufficient information to code with codable children: Secondary | ICD-10-CM

## 2014-01-31 DIAGNOSIS — R1030 Lower abdominal pain, unspecified: Secondary | ICD-10-CM

## 2014-01-31 DIAGNOSIS — R109 Unspecified abdominal pain: Secondary | ICD-10-CM

## 2014-01-31 NOTE — Progress Notes (Signed)
   Subjective:    Patient ID: Katelyn Mcfarland, female    DOB: Aug 13, 1991, 23 y.o.   MRN: [id number]  HPI He complains of a vaginal odor and some discharge and pain since she had a gynecologic procedure in February. Apparently she was given an antibiotic by her GYN but this has not helped. She also states she has had some pain with intercourse prior to this. Apparently her boyfriend does not use condoms. She has not forced the issue in this regard. She is concerned over his sexual promiscuity.   Review of Systems     Objective:   Physical Exam Vaginal exam shows the labia to be normal with no discharge. Cervix is normal. There is minimal discharge present KOH and wet prep were both negative. GC/immediate culture taken.       Assessment & Plan:  Abdominal pain, lower - Plan: GC/Chlamydia Amp Probe, Genital  Dyspareunia  I will wait for the GC/immediate culture. If this is negative, I discussed her returning to see her GYN for further evaluation and possible treatment.

## 2014-02-01 LAB — GC/CHLAMYDIA PROBE AMP
CT Probe RNA: NEGATIVE
GC Probe RNA: NEGATIVE

## 2014-02-28 ENCOUNTER — Ambulatory Visit: Payer: Medicaid Other | Admitting: Medical

## 2014-03-01 ENCOUNTER — Encounter: Payer: Self-pay | Admitting: Family Medicine

## 2014-04-26 ENCOUNTER — Encounter: Payer: Self-pay | Admitting: Medical

## 2014-04-26 ENCOUNTER — Ambulatory Visit (INDEPENDENT_AMBULATORY_CARE_PROVIDER_SITE_OTHER): Payer: Self-pay | Admitting: Medical

## 2014-04-26 VITALS — BP 90/72 | HR 60 | Temp 98.0°F | Resp 10 | Ht 60.0 in | Wt 170.0 lb

## 2014-04-26 DIAGNOSIS — N9489 Other specified conditions associated with female genital organs and menstrual cycle: Secondary | ICD-10-CM

## 2014-04-26 DIAGNOSIS — N898 Other specified noninflammatory disorders of vagina: Secondary | ICD-10-CM

## 2014-04-26 LAB — POCT URINALYSIS DIPSTICK
Bilirubin, UA: NEGATIVE
Glucose, UA: NEGATIVE
Ketones, UA: NEGATIVE
Leukocytes, UA: NEGATIVE
Nitrite, UA: NEGATIVE
Protein, UA: NEGATIVE
Spec Grav, UA: 1.015
Urobilinogen, UA: NEGATIVE
pH, UA: 6

## 2014-04-26 MED ORDER — METRONIDAZOLE 500 MG PO TABS: ORAL_TABLET | ORAL | Status: DC

## 2014-04-26 MED ORDER — FLUCONAZOLE 150 MG PO TABS: 150.0000 mg | ORAL_TABLET | Freq: Once | ORAL | Status: DC

## 2014-04-26 NOTE — Progress Notes (Signed)
   Subjective:    Patient ID: Katelyn Mcfarland, female    DOB: 1991/08/22, 23 y.o.   MRN: [id number]  HPI  Patient is presenting for 3 week history of worsening lower abdominal pain. Associated symptoms include thick white vaginal discharge with a foul smell, dyspareunia, vomiting intermittently for the last 2 weeks. Initially, she thought it was a yeast infection but has become more worried since pain has not improved and the smell has worsened. She is sexually active with 1 partner but uses no barriers. Has Nexplanon, left arm, since January for contraception.  Denies fevers, vaginal bleeding, dysuria, diarrhea, hematuria, urinary frequency. 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 Gaynell FaceMarshall. History is also significant for chronic yeast infections which improved after cryotherapy. She has also been treated for chlamydia and trichomonas when she was in her teens. Of note, she was also tested for Gonorrhea, Chlamydia in April 2015, results were negative.  No other aggravating or relieving factors.   Review of Systems As in subjective.    Objective:   Physical Exam  BP 90/72  Pulse 60  Temp(Src) 98 F (36.7 C)  Resp 10  Ht 5' (1.524 m)  Wt 170 lb (77.111 kg)  BMI 33.20 kg/m2  General appearence: alert, no distress, WD/WN, overweight African-American female Heart: RRR, normal S1, S2, no murmurs Lungs: CTA bilaterally, no wheezes, rhonchi, or rales Abdomen: +bs, soft, lower abdominal tenderness, non distended, no masses, no hepatomegaly, no splenomegaly, no CVA tenderness Gyn: normal external genitalia, vaginal normal mucosa, white somewhat thick vaginal discharge, cervix normal appearing, no adnexal mass or tenderness, swabs taken, exam chaperoned by nurse.      Assessment & Plan:   Encounter Diagnoses  Name Primary?  . Vaginal discharge Yes  . Vaginal odor    Wet prep/KOH shows yeast and few trichomonads.     Discussed  findings, send swab for GC/Chlamydia.  Begin diflucan and metronidazole.  Partner will need to see their doctor for treatment as well.   F/U pending labs  Patient was seen in conjunction with PA student Wallis BambergMario Adelia Baptista, and I have also evaluated and examined patient, agree with student's notes, student supervised by me.

## 2014-04-27 ENCOUNTER — Telehealth: Payer: Self-pay | Admitting: Medical

## 2014-04-27 LAB — GC/CHLAMYDIA PROBE AMP
CT Probe RNA: NEGATIVE
GC Probe RNA: NEGATIVE

## 2014-04-27 NOTE — Telephone Encounter (Signed)
Done and patient is aware of the lab results. CLS

## 2014-04-27 NOTE — Telephone Encounter (Signed)
Please ask Alvino ChapelJo to check on GC Chlamydia

## 2014-05-26 ENCOUNTER — Emergency Department (INDEPENDENT_AMBULATORY_CARE_PROVIDER_SITE_OTHER)
Admission: EM | Admit: 2014-05-26 | Discharge: 2014-05-26 | Disposition: A | Discharge status: Home / Self Care | Payer: Self-pay | Source: Home / Self Care | Attending: Family Medicine | Admitting: Family Medicine

## 2014-05-26 ENCOUNTER — Encounter (HOSPITAL_COMMUNITY): Payer: Self-pay | Admitting: Emergency Medicine

## 2014-05-26 DIAGNOSIS — R51 Headache: Secondary | ICD-10-CM

## 2014-05-26 DIAGNOSIS — R519 Headache, unspecified: Secondary | ICD-10-CM

## 2014-05-26 DIAGNOSIS — G8929 Other chronic pain: Secondary | ICD-10-CM

## 2014-05-26 MED ORDER — ONDANSETRON 4 MG PO TBDP
ORAL_TABLET | ORAL | Status: AC
  Filled 2014-05-26: qty 1

## 2014-05-26 MED ORDER — SUMATRIPTAN SUCCINATE 100 MG PO TABS: ORAL_TABLET | ORAL | Status: DC

## 2014-05-26 MED ORDER — IBUPROFEN 800 MG PO TABS
ORAL_TABLET | ORAL | Status: AC
  Filled 2014-05-26: qty 1

## 2014-05-26 MED ORDER — ONDANSETRON HCL 4 MG PO TABS: 4.0000 mg | ORAL_TABLET | Freq: Three times a day (TID) | ORAL | Status: DC | PRN

## 2014-05-26 MED ORDER — ONDANSETRON 4 MG PO TBDP
4.0000 mg | ORAL_TABLET | Freq: Once | ORAL | Status: AC
  Administered 2014-05-26: 4 mg via ORAL

## 2014-05-26 MED ORDER — IBUPROFEN 800 MG PO TABS
800.0000 mg | ORAL_TABLET | Freq: Once | ORAL | Status: AC
  Administered 2014-05-26: 800 mg via ORAL

## 2014-05-26 NOTE — ED Provider Notes (Signed)
CSN: [id number]     Arrival date & time 05/26/14  1316 History   First MD Initiated Contact with Patient 05/26/14 1329     Chief Complaint  Patient presents with  . Migraine   (Consider location/radiation/quality/duration/timing/severity/associated sxs/prior Treatment) HPI Comments: Patient reports chronic migraine type headaches with associated aura/vision disturbance since she was a teenager. Reports that she has discussed this with her PCP and has been prescribed ibuprofen in the past for symptoms. States she has been having daily headaches  Over the past several weeks with associated distorted vision and nausea. No report of recent illness or head trauma. No fever or neck pain. Described symptoms as a pressure sensation behind both of her eyes and in occipital region.  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 LNMP: has Implanon  Patient is a 23 y.o. female presenting with migraines. The history is provided by the patient.  Migraine    Past Medical History  Diagnosis Date  . High cholesterol   . Chlamydia     age 53yo; hx/o trichomonas age 21yo  . Chronic headache   . Depression   . Allergy    Past Surgical History  Procedure Laterality Date  . Wisdom tooth extraction     Family History  Problem Relation Age of Onset  . Hypertension Mother   . Diabetes Mother   . Asthma Sister   . Cancer Paternal Grandmother   . Cancer Paternal Grandfather   . Heart disease Neg Hx   . Stroke Neg Hx    History  Substance Use Topics  . Smoking status: Current Every Day Smoker -- 1.00 packs/day for 12 years    Types: Cigarettes  . Smokeless tobacco: Never Used     Comment: quit x 3 wk as of 03/2012  . Alcohol Use: Yes     Comment: Occasionally   OB History   Grav Para Term Preterm Abortions TAB SAB Ect Mult Living                 Review of Systems  All other systems reviewed and are negative.   Allergies  Review of patient's allergies  indicates no known allergies.  Home Medications   Prior to Admission medications   Medication Sig Start Date End Date Taking? Authorizing Provider  Etonogestrel (IMPLANON [place]) Inject into the skin.    Historical Provider, MD  fluconazole (DIFLUCAN) 150 MG tablet Take 1 tablet (150 mg total) by mouth once. 04/26/14   Kermit Balo Tysinger, PA-C  ibuprofen (ADVIL,MOTRIN) 600 MG tablet Take 1 tablet (600 mg total) by mouth every 6 (six) hours as needed. 12/01/13   Brandt Loosen, MD  metroNIDAZOLE (FLAGYL) 500 MG tablet 4 tablets once 04/26/14   Kermit Balo Tysinger, PA-C  ondansetron (ZOFRAN) 4 MG tablet Take 1 tablet (4 mg total) by mouth every 8 (eight) hours as needed for nausea or vomiting. 05/26/14   Ria Clock, PA  SUMAtriptan (IMITREX) 100 MG tablet Take one by mouth at onset of headache. May repeat in 2 hours if headache persists or recurs. No more than two doses in 24 hours 05/26/14   Jess Barters H Elayah Klooster, PA   BP 107/67  Pulse 70  Temp(Src) 98.8 F (37.1 C) (Oral)  Resp 16  SpO2 100% Physical Exam  Nursing note and vitals reviewed. Constitutional: She is oriented to person, place, and time. She appears well-developed and well-nourished. No distress.  HENT:  Head: Normocephalic  and atraumatic.  Right Ear: Hearing and external ear normal.  Left Ear: Hearing and external ear normal.  Nose: Nose normal.  Mouth/Throat: Uvula is midline, oropharynx is clear and moist and mucous membranes are normal. No trismus in the jaw.  Eyes: Conjunctivae, EOM and lids are normal. Pupils are equal, round, and reactive to light.  Fundoscopic exam:      The right eye shows no hemorrhage and no papilledema.       The left eye shows no hemorrhage and no papilledema.  Neck: Normal range of motion, full passive range of motion without pain and phonation normal. Neck supple.  Cardiovascular: Normal rate, regular rhythm and normal heart sounds.   Pulmonary/Chest: Effort normal and breath sounds normal. No  respiratory distress. She has no wheezes.  Abdominal: There is no tenderness.  Musculoskeletal: Normal range of motion.  Neurological: She is alert and oriented to person, place, and time. She has normal strength. No cranial nerve deficit or sensory deficit. Coordination and gait normal. GCS eye subscore is 4. GCS verbal subscore is 5. GCS motor subscore is 6.  Skin: Skin is warm and dry.  Psychiatric: She has a normal mood and affect. Her behavior is normal.    ED Course  Procedures (including critical care time) Labs Review Labs Reviewed - No data to display  Imaging Review No results found.   MDM   1. Chronic nonintractable headache, unspecified headache type    No clinical evidence of acute intracranial process. Will provide Rx for Imitrex with Good Rx discount card and coupon along with Rx for Zofran. Advised patient to contact [facility] for further evaluation and treatment. Explained to patient that is symptoms become suddenly worse or severe, she should seek evaluation at her nearest ER.     Ria ClockJennifer Lee H Curly Mackowski, [place] 05/26/14 1425

## 2014-05-26 NOTE — ED Notes (Signed)
Patient c/o migraine headache x 2 weeks. Patient reports she has sensitivity to light and sound. Has had vision changes with double vision. Patient reports she has pressure behind her eyes and in the back of her head. Patient is alert and oriented and in no acute distress.

## 2014-05-26 NOTE — Discharge Instructions (Signed)
I would encourage you to contact the [facility] for continued evaluation and treatment.  Migraine Headache A migraine headache is an intense, throbbing pain on one or both sides of your head. A migraine can last for 30 minutes to several hours. CAUSES  The exact cause of a migraine headache is not always known. However, a migraine may be caused when nerves in the brain become irritated and release chemicals that cause inflammation. This causes pain. Certain things may also trigger migraines, such as:  Alcohol.  Smoking.  Stress.  Menstruation.  Aged cheeses.  Foods or drinks that contain nitrates, glutamate, aspartame, or tyramine.  Lack of sleep.  Chocolate.  Caffeine.  Hunger.  Physical exertion.  Fatigue.  Medicines used to treat chest pain (nitroglycerine), birth control pills, estrogen, and some blood pressure medicines. SIGNS AND SYMPTOMS  Pain on one or both sides of your head.  Pulsating or throbbing pain.  Severe pain that prevents daily activities.  Pain that is aggravated by any physical activity.  Nausea, vomiting, or both.  Dizziness.  Pain with exposure to bright lights, loud noises, or activity.  General sensitivity to bright lights, loud noises, or smells. Before you get a migraine, you may get warning signs that a migraine is coming (aura). An aura may include:  Seeing flashing lights.  Seeing bright spots, halos, or zigzag lines.  Having tunnel vision or blurred vision.  Having feelings of numbness or tingling.  Having trouble talking.  Having muscle weakness. DIAGNOSIS  A migraine headache is often diagnosed based on:  Symptoms.  Physical exam.  A CT scan or MRI of your head. These imaging tests cannot diagnose migraines, but they can help rule out other causes of headaches. TREATMENT Medicines may be given for pain and nausea. Medicines can also be given to help prevent recurrent migraines.  HOME CARE  INSTRUCTIONS  Only take over-the-counter or prescription medicines for pain or discomfort as directed by your health care provider. The use of long-term narcotics is not recommended.  Lie down in a dark, quiet room when you have a migraine.  Keep a journal to find out what may trigger your migraine headaches. For example, write down:  What you eat and drink.  How much sleep you get.  Any change to your diet or medicines.  Limit alcohol consumption.  Quit smoking if you smoke.  Get 7-9 hours of sleep, or as recommended by your health care provider.  Limit stress.  Keep lights dim if bright lights bother you and make your migraines worse. SEEK IMMEDIATE MEDICAL CARE IF:   Your migraine becomes severe.  You have a fever.  You have a stiff neck.  You have vision loss.  You have muscular weakness or loss of muscle control.  You start losing your balance or have trouble walking.  You feel faint or pass out.  You have severe symptoms that are different from your first symptoms. MAKE SURE YOU:   Understand these instructions.  Will watch your condition.  Will get help right away if you are not doing well or get worse. Document Released: 09/29/2005 Document Revised: 02/13/2014 Document Reviewed: 06/06/2013 [facility] Patient Information 2015 [place], [place]. This information is not intended to replace advice given to you by your health care provider. Make sure you discuss any questions you have with your health care provider.  Headaches, Frequently Asked Questions MIGRAINE HEADACHES Q: What is migraine? What causes it? How can I treat it? A: Generally, migraine headaches begin  as a dull ache. Then they develop into a constant, throbbing, and pulsating pain. You may experience pain at the temples. You may experience pain at the front or back of one or both sides of the head. The pain is usually accompanied by a combination of:  Nausea.  Vomiting.  Sensitivity to light  and noise. Some people (about 15%) experience an aura (see below) before an attack. The cause of migraine is believed to be chemical reactions in the brain. Treatment for migraine may include over-the-counter or prescription medications. It may also include self-help techniques. These include relaxation training and biofeedback.  Q: What is an aura? A: About 15% of people with migraine get an "aura". This is a sign of neurological symptoms that occur before a migraine headache. You may see wavy or jagged lines, dots, or flashing lights. You might experience tunnel vision or blind spots in one or both eyes. The aura can include visual or auditory hallucinations (something imagined). It may include disruptions in smell (such as strange odors), taste or touch. Other symptoms include:  Numbness.  A "pins and needles" sensation.  Difficulty in recalling or speaking the correct word. These neurological events may last as long as 60 minutes. These symptoms will fade as the headache begins. Q: What is a trigger? A: Certain physical or environmental factors can lead to or "trigger" a migraine. These include:  Foods.  Hormonal changes.  Weather.  Stress. It is important to remember that triggers are different for everyone. To help prevent migraine attacks, you need to figure out which triggers affect you. Keep a headache diary. This is a good way to track triggers. The diary will help you talk to your healthcare professional about your condition. Q: Does weather affect migraines? A: Bright sunshine, hot, humid conditions, and drastic changes in barometric pressure may lead to, or "trigger," a migraine attack in some people. But studies have shown that weather does not act as a trigger for everyone with migraines. Q: What is the link between migraine and hormones? A: Hormones start and regulate many of your body's functions. Hormones keep your body in balance within a constantly changing environment.  The levels of hormones in your body are unbalanced at times. Examples are during menstruation, pregnancy, or menopause. That can lead to a migraine attack. In fact, about three quarters of all women with migraine report that their attacks are related to the menstrual cycle.  Q: Is there an increased risk of stroke for migraine sufferers? A: The likelihood of a migraine attack causing a stroke is very remote. That is not to say that migraine sufferers cannot have a stroke associated with their migraines. In persons under age 69, the most common associated factor for stroke is migraine headache. But over the course of a person's normal life span, the occurrence of migraine headache may actually be associated with a reduced risk of dying from cerebrovascular disease due to stroke.  Q: What are acute medications for migraine? A: Acute medications are used to treat the pain of the headache after it has started. Examples over-the-counter medications, NSAIDs, ergots, and triptans.  Q: What are the triptans? A: Triptans are the newest class of abortive medications. They are specifically targeted to treat migraine. Triptans are vasoconstrictors. They moderate some chemical reactions in the brain. The triptans work on receptors in your brain. Triptans help to restore the balance of a neurotransmitter called serotonin. Fluctuations in levels of serotonin are thought to be a main cause of  migraine.  Q: Are over-the-counter medications for migraine effective? A: Over-the-counter, or "OTC," medications may be effective in relieving mild to moderate pain and associated symptoms of migraine. But you should see your caregiver before beginning any treatment regimen for migraine.  Q: What are preventive medications for migraine? A: Preventive medications for migraine are sometimes referred to as "prophylactic" treatments. They are used to reduce the frequency, severity, and length of migraine attacks. Examples of preventive  medications include antiepileptic medications, antidepressants, beta-blockers, calcium channel blockers, and NSAIDs (nonsteroidal anti-inflammatory drugs). Q: Why are anticonvulsants used to treat migraine? A: During the past few years, there has been an increased interest in antiepileptic drugs for the prevention of migraine. They are sometimes referred to as "anticonvulsants". Both epilepsy and migraine may be caused by similar reactions in the brain.  Q: Why are antidepressants used to treat migraine? A: Antidepressants are typically used to treat people with depression. They may reduce migraine frequency by regulating chemical levels, such as serotonin, in the brain.  Q: What alternative therapies are used to treat migraine? A: The term "alternative therapies" is often used to describe treatments considered outside the scope of conventional Western medicine. Examples of alternative therapy include acupuncture, acupressure, and yoga. Another common alternative treatment is herbal therapy. Some herbs are believed to relieve headache pain. Always discuss alternative therapies with your caregiver before proceeding. Some herbal products contain arsenic and other toxins. TENSION HEADACHES Q: What is a tension-type headache? What causes it? How can I treat it? A: Tension-type headaches occur randomly. They are often the result of temporary stress, anxiety, fatigue, or anger. Symptoms include soreness in your temples, a tightening band-like sensation around your head (a "vice-like" ache). Symptoms can also include a pulling feeling, pressure sensations, and contracting head and neck muscles. The headache begins in your forehead, temples, or the back of your head and neck. Treatment for tension-type headache may include over-the-counter or prescription medications. Treatment may also include self-help techniques such as relaxation training and biofeedback. CLUSTER HEADACHES Q: What is a cluster headache? What  causes it? How can I treat it? A: Cluster headache gets its name because the attacks come in groups. The pain arrives with little, if any, warning. It is usually on one side of the head. A tearing or bloodshot eye and a runny nose on the same side of the headache may also accompany the pain. Cluster headaches are believed to be caused by chemical reactions in the brain. They have been described as the most severe and intense of any headache type. Treatment for cluster headache includes prescription medication and oxygen. SINUS HEADACHES Q: What is a sinus headache? What causes it? How can I treat it? A: When a cavity in the bones of the face and skull (a sinus) becomes inflamed, the inflammation will cause localized pain. This condition is usually the result of an allergic reaction, a tumor, or an infection. If your headache is caused by a sinus blockage, such as an infection, you will probably have a fever. An x-ray will confirm a sinus blockage. Your caregiver's treatment might include antibiotics for the infection, as well as antihistamines or decongestants.  REBOUND HEADACHES Q: What is a rebound headache? What causes it? How can I treat it? A: A pattern of taking acute headache medications too often can lead to a condition known as "rebound headache." A pattern of taking too much headache medication includes taking it more than 2 days per week or in excessive amounts. That  means more than the label or a caregiver advises. With rebound headaches, your medications not only stop relieving pain, they actually begin to cause headaches. Doctors treat rebound headache by tapering the medication that is being overused. Sometimes your caregiver will gradually substitute a different type of treatment or medication. Stopping may be a challenge. Regularly overusing a medication increases the potential for serious side effects. Consult a caregiver if you regularly use headache medications more than 2 days per week or  more than the label advises. ADDITIONAL QUESTIONS AND ANSWERS Q: What is biofeedback? A: Biofeedback is a self-help treatment. Biofeedback uses special equipment to monitor your body's involuntary physical responses. Biofeedback monitors:  Breathing.  Pulse.  Heart rate.  Temperature.  Muscle tension.  Brain activity. Biofeedback helps you refine and perfect your relaxation exercises. You learn to control the physical responses that are related to stress. Once the technique has been mastered, you do not need the equipment any more. Q: Are headaches hereditary? A: Four out of five (80%) of people that suffer report a family history of migraine. Scientists are not sure if this is genetic or a family predisposition. Despite the uncertainty, a child has a 50% chance of having migraine if one parent suffers. The child has a 75% chance if both parents suffer.  Q: Can children get headaches? A: By the time they reach high school, most young people have experienced some type of headache. Many safe and effective approaches or medications can prevent a headache from occurring or stop it after it has begun.  Q: What type of doctor should I see to diagnose and treat my headache? A: Start with your primary caregiver. Discuss his or her experience and approach to headaches. Discuss methods of classification, diagnosis, and treatment. Your caregiver may decide to recommend you to a headache specialist, depending upon your symptoms or other physical conditions. Having diabetes, allergies, etc., may require a more comprehensive and inclusive approach to your headache. The National Head[employer] Foundation will provide, upon request, a list of [facility] physician members in your state. Document Released: 12/20/2003 Document Revised: 12/22/2011 Document Reviewed: 05/29/2008 [facility] Patient Information 2015 [place], [place]. This information is not intended to replace advice given to you by your health care provider. Make  sure you discuss any questions you have with your health care provider.

## 2014-05-26 NOTE — ED Provider Notes (Signed)
Medical screening examination/treatment/procedure(s) were performed by resident physician or non-physician practitioner and as supervising physician I was immediately available for consultation/collaboration.   Barkley BrunsKINDL,Zvi Duplantis DOUGLAS MD.   Linna HoffJames D Younes Degeorge, MD 05/26/14 [phone]

## 2014-06-29 ENCOUNTER — Encounter (HOSPITAL_COMMUNITY): Payer: Self-pay | Admitting: Emergency Medicine

## 2014-06-29 ENCOUNTER — Emergency Department (HOSPITAL_COMMUNITY)
Admission: EM | Admit: 2014-06-29 | Discharge: 2014-06-29 | Disposition: A | Discharge status: Home / Self Care | Payer: Medicaid Other | Attending: Emergency Medicine | Admitting: Emergency Medicine

## 2014-06-29 DIAGNOSIS — Z862 Personal history of diseases of the blood and blood-forming organs and certain disorders involving the immune mechanism: Secondary | ICD-10-CM | POA: Insufficient documentation

## 2014-06-29 DIAGNOSIS — IMO0002 Reserved for concepts with insufficient information to code with codable children: Secondary | ICD-10-CM | POA: Insufficient documentation

## 2014-06-29 DIAGNOSIS — Z8639 Personal history of other endocrine, nutritional and metabolic disease: Secondary | ICD-10-CM | POA: Insufficient documentation

## 2014-06-29 DIAGNOSIS — F17200 Nicotine dependence, unspecified, uncomplicated: Secondary | ICD-10-CM | POA: Insufficient documentation

## 2014-06-29 DIAGNOSIS — Z8659 Personal history of other mental and behavioral disorders: Secondary | ICD-10-CM | POA: Insufficient documentation

## 2014-06-29 DIAGNOSIS — Z79899 Other long term (current) drug therapy: Secondary | ICD-10-CM | POA: Insufficient documentation

## 2014-06-29 DIAGNOSIS — Z3202 Encounter for pregnancy test, result negative: Secondary | ICD-10-CM | POA: Insufficient documentation

## 2014-06-29 DIAGNOSIS — N949 Unspecified condition associated with female genital organs and menstrual cycle: Secondary | ICD-10-CM | POA: Insufficient documentation

## 2014-06-29 DIAGNOSIS — G8929 Other chronic pain: Secondary | ICD-10-CM | POA: Insufficient documentation

## 2014-06-29 DIAGNOSIS — N898 Other specified noninflammatory disorders of vagina: Secondary | ICD-10-CM | POA: Insufficient documentation

## 2014-06-29 DIAGNOSIS — Z8619 Personal history of other infectious and parasitic diseases: Secondary | ICD-10-CM | POA: Insufficient documentation

## 2014-06-29 LAB — URINALYSIS, ROUTINE W REFLEX MICROSCOPIC
Bilirubin Urine: NEGATIVE
Glucose, UA: NEGATIVE mg/dL
Hgb urine dipstick: NEGATIVE
Ketones, ur: NEGATIVE mg/dL
Leukocytes, UA: NEGATIVE
Nitrite: NEGATIVE
Protein, ur: NEGATIVE mg/dL
Specific Gravity, Urine: 1.016 (ref 1.005–1.030)
Urobilinogen, UA: 0.2 mg/dL (ref 0.0–1.0)
pH: 7.5 (ref 5.0–8.0)

## 2014-06-29 LAB — PREGNANCY, URINE: Preg Test, Ur: NEGATIVE

## 2014-06-29 LAB — WET PREP, GENITAL
Clue Cells Wet Prep HPF POC: NONE SEEN
Trich, Wet Prep: NONE SEEN
Yeast Wet Prep HPF POC: NONE SEEN

## 2014-06-29 NOTE — ED Notes (Signed)
Pt states she has had abnormal pap smears and was supposed to f/u, but lost her Medicaid and was unable to f/u.  Pt c/o pelvic pain x 1 month.  Pt reports increased pain with intercourse.

## 2014-06-29 NOTE — Discharge Instructions (Signed)
Please follow up with your primary care physician in 1-2 days. If you do not have one please call the [facility] number listed above. Please follow up with an Ob/Gyn to schedule a follow up appointment.  Please read all discharge instructions and return precautions.    Dyspareunia Dyspareunia is pain during sexual intercourse. It is most common in women, but it also happens in men.  CAUSES  Female The pain from this condition is usually felt when anything is put into the vagina, but any part of the genitals may cause pain during sex. Even sitting or wearing pants can cause pain. Sometimes, a cause cannot be found. Some causes of pain during intercourse are:  Infections of the skin around the vagina.  Vaginal infections, such as a yeast, bacterial, or viral infection.  Vaginismus. This is the inability to have anything put in the vagina even when the woman wants it to happen. There is an automatic muscle contraction and pain. The pain of the muscle contraction can be so severe that intercourse is impossible.  Allergic reaction from spermicides, semen, condoms, scented tampons, soaps, douches, and vaginal sprays.  A fluid-filled sac (cyst) on the Bartholin or Skene glands, located at the opening of the vagina.  Scar tissue in the vagina from a surgically enlarged opening (episiotomy) or tearing after delivering a baby.  Vaginal dryness. This is more common in menopause. The normal secretions of the vagina are decreased. Changes in estrogen levels and increased difficulty becoming aroused can cause painful sex. Vaginal dryness can also happen when taking birth control pills.  Thinning of the tissue (atrophy) of the vulva and vagina. This makes the area thinner, smaller, unable to stretch to accommodate a penis, and prone to infection and tearing.  Vulvar vestibulitis or vestibulodynia.This is a condition that causes pain involving the area around the entrance to the  vagina.The most common cause in young women is birth control pills.Women with low estrogen levels (postmenopausal women) may also experience this.Other causes include allergic reactions, too many nerve endings, skin conditions, and pelvic muscles that cannot relax.  Vulvar dermatoses. This includes skin conditions such as lichen sclerosus and lichen planus.  Lack of foreplay to lubricate the vagina. This can cause vaginal dryness.  Noncancerous tumors (fibroids) in the uterus.  Uterus lining tissue growing outside the uterus (endometriosis).  Pregnancy that starts in the fallopian tube (tubal pregnancy).  Pregnancy or breastfeeding your baby. This can cause vaginal dryness.  A tilting or prolapse of the uterus. Prolapse is when weak and stretched muscles around the uterus allow it to fall into the vagina.  Problems with the ovaries, cysts, or scar tissue. This may be worse with certain sexual positions.  Previous surgeries causing adhesions or scar tissue in the vagina or pelvis.  Bladder and intestinal problems.  Psychological problems (such as depression or anxiety). This may make pain worse.  Negative attitudes about sex, experiencing rape, sexual assault, and misinformation about sex. These issues are often related to some types of pain.  Previous pelvic infection, causing scar tissue in the pelvis and on the female organs.  Cyst or tumor on the ovary.  Cancer of the female organs.  Certain medicines.  Medical problems such as diabetes, arthritis, or thyroid disease. Female In men, there are many physical causes of sexual discomfort. Some causes of pain during intercourse are:  Infections of the prostate, bladder, or seminal vesicles. This can cause pain after ejaculation.  An inflamed bladder (interstitial  cystitis). This may cause pain from ejaculation.  Gonorrheal infections. This may cause pain during ejaculation.  An inflamed urethra (urethritis) or inflamed  prostate (prostatitis). This can make genital stimulation painful or uncomfortable.  Deformities of the penis, such as Peyronie's disease.  A tight foreskin.  Cancer of the female organs.  Psychological problems. This may make pain worse. DIAGNOSIS   Your caregiver will take a history and have you describe where the pain is located (outside the vagina, in the vagina, in the pelvis). You may be asked when you experience pain, such as with penetration or with thrusting.  Following this, your caregiver will do a physical exam. Let your caregiver know if the exam is too painful.  During the final part of the female exam, your caregiver will feel your uterus and ovaries with one hand on the abdomen and one finger in your vagina. This is a pelvic exam.  Blood tests, a Pap test, cultures for infection, an ultrasound test, and X-rays may be done. You may need to see a specialist for female problems (gynecologist).  Your caregiver may do a CT scan, MRI, or laparoscopy. Laparoscopy is a procedure to look into the pelvis with a lighted tube, through a cut (incision) in the abdomen. TREATMENT  Your caregiver can help you determine the best course of treatment. Sometimes, more testing is done. Continue with the suggested testing until your caregiver feels sure about your diagnosis and how to treat it. Sometimes, it is difficult to find the reason for the pain. The search for the cause and treatment can be frustrating. Treatment often takes several weeks to a few months before you notice any improvement. You may also need to avoid sexual activity until symptoms improve.Continuing to have sex when it hurts can delay healing and actually make the problem worse. The treatment depends on the cause of the pain. Treatment may include:  Medicines such as antibiotics, vaginal or skin creams, hormones, or antidepressants.  Minor or major surgery.  Psychological counseling or group therapy.  Kegel exercises and  vaginal dilators to help certain cases of vaginismus (spasms). Do this only if recommended by your caregiver.Kegel exercises can make some problems worse.  Applying lubrication as recommended by your caregiver if you have dryness.  Sex therapy for you and your sex partner. It is common for the pain to continue after the reason for the pain has been treated. Some reasons for this include a conditioned response. This means the person having the pain becomes so familiar with the pain that the pain continues as a response, even though the cause is removed. Sex therapy can help with this problem. HOME CARE INSTRUCTIONS   Follow your caregiver's instructions about taking medicines, tests, counseling, and follow-up treatment.  Do not use scented tampons, douches, vaginal sprays, or soaps.  Use water-based lubricants for dryness. Oil lubricants can cause irritation.  Do not use spermicides or condoms that irritate you.  Openly discuss with your partner your sexual experience, your desires, foreplay, and different sexual positions for a more comfortable and enjoyable sexual relationship.  Join group sessions for therapy, if needed.  Practice safe sex at all times.  Empty your bladder before having intercourse.  Try different positions during sexual intercourse.  Take over-the-counter pain medicine recommended by your caregiver before having sexual intercourse.  Do not wear pantyhose. Knee-high and thigh-high hose are okay.  Avoid scrubbing your vulva with a washcloth. Wash the area gently and pat dry with a towel. SEEK  MEDICAL CARE IF:   You develop vaginal bleeding after sexual intercourse.  You develop a lump at the opening of your vagina, even if it is not painful.  You have abnormal vaginal discharge.  You have vaginal dryness.  You have itching or irritation of the vulva or vagina.  You develop a rash or reaction to your medicine. SEEK IMMEDIATE MEDICAL CARE IF:   You  develop severe abdominal pain during or shortly after sexual intercourse. You could have a ruptured ovarian cyst or ruptured tubal pregnancy.  You have a fever.  You have painful or bloody urination.  You have painful sexual intercourse, and you never had it before.  You pass out after having sexual intercourse. Document Released: 10/19/2007 Document Revised: 12/22/2011 Document Reviewed: 12/30/2010 [employer] [facility] [facility] Patient Information 2015 [place], [place]. This information is not intended to replace advice given to you by your health care provider. Make sure you discuss any questions you have with your health care provider.

## 2014-06-29 NOTE — ED Notes (Signed)
Pt here with c/o pelvic pain , pt states  No discharge now but did have some discharge and an odor about 2 weeks ago , no abnormal bleeding

## 2014-06-29 NOTE — ED Provider Notes (Signed)
CSN: [id number]     Arrival date & time 06/29/14  1022 History   First MD Initiated Contact with Patient 06/29/14 1027     Chief Complaint  Patient presents with  . Pelvic Pain     (Consider location/radiation/quality/duration/timing/severity/associated sxs/prior Treatment) HPI Comments: Patient is a [id number] 23 yo F presenting to the ED for 1 month of pelvic pain with intercourse with three to four days of white vaginal discharge. Alleviating factors: Motrin (minimally). Aggravating factors: sexual intercourse. Patient states she recently had an abnormal pap smear and was advised to follow up with Ob/Gyn but did not due to insurance lapsing. Denies any fevers, chills, nausea, vomiting, diarrhea, urinary symptoms.   Patient is a 23 y.o. female presenting with pelvic pain.  Pelvic Pain    Past Medical History  Diagnosis Date  . High cholesterol   . Chlamydia     age 37yo; hx/o trichomonas age 62yo  . Chronic headache   . Depression   . Allergy    Past Surgical History  Procedure Laterality Date  . Wisdom tooth extraction     Family History  Problem Relation Age of Onset  . Hypertension Mother   . Diabetes Mother   . Asthma Sister   . Cancer Paternal Grandmother   . Cancer Paternal Grandfather   . Heart disease Neg Hx   . Stroke Neg Hx    History  Substance Use Topics  . Smoking status: Current Every Day Smoker -- 1.00 packs/day for 12 years    Types: Cigarettes  . Smokeless tobacco: Never Used     Comment: quit x 3 wk as of 03/2012  . Alcohol Use: Yes     Comment: Occasionally   OB History   Grav Para Term Preterm Abortions TAB SAB Ect Mult Living                 Review of Systems  Genitourinary: Positive for vaginal discharge and pelvic pain.  All other systems reviewed and are negative.     Allergies  Review of patient's allergies indicates no known allergies.  Home Medications   Prior to Admission medications   Medication Sig Start Date End Date  Taking? Authorizing Provider  Etonogestrel (IMPLANON [place]) Inject into the skin.   Yes Historical Provider, MD  ibuprofen (ADVIL,MOTRIN) 800 MG tablet Take 800 mg by mouth every 8 (eight) hours as needed.   Yes Historical Provider, MD   BP 121/70  Pulse 85  Temp(Src) 98.7 F (37.1 C) (Oral)  Resp 18  Ht  (1.499 m)  Wt 170 lb (77.111 kg)  BMI 34.32 kg/m2  SpO2 100% Physical Exam  Nursing note and vitals reviewed. Constitutional: She is oriented to person, place, and time. She appears well-developed and well-nourished. No distress.  HENT:  Head: Normocephalic and atraumatic.  Right Ear: External ear normal.  Left Ear: External ear normal.  Nose: Nose normal.  Mouth/Throat: Oropharynx is clear and moist.  Eyes: Conjunctivae are normal.  Neck: Normal range of motion. Neck supple.  Cardiovascular: Normal rate, regular rhythm and normal heart sounds.   Pulmonary/Chest: Effort normal and breath sounds normal.  Abdominal: Soft. Bowel sounds are normal. There is no tenderness.  Musculoskeletal: Normal range of motion.  Neurological: She is alert and oriented to person, place, and time.  Skin: Skin is warm and dry. She is not diaphoretic.  Psychiatric: She has a normal mood and affect.   Exam performed by Francee Piccolo L,  exam chaperoned Date:  06/29/2014 Pelvic exam: normal external genitalia without evidence of trauma. VULVA: normal appearing vulva with no masses, tenderness or lesion. VAGINA: normal appearing vagina with normal color and discharge, no lesions. CERVIX: normal appearing cervix without lesions, cervical motion tenderness absent, cervical os closed with out purulent discharge; vaginal discharge - white, Wet prep and DNA probe for chlamydia and GC obtained.   ADNEXA: normal adnexa in size, nontender and no masses UTERUS: uterus is normal size, shape, consistency and nontender.   ED Course  Procedures (including critical care time) Medications - No data to  display  Labs Review Labs Reviewed  WET PREP, GENITAL - Abnormal; Notable for the following:    WBC, Wet Prep HPF POC FEW (*)    All other components within normal limits  GC/CHLAMYDIA PROBE AMP  URINALYSIS, ROUTINE W REFLEX MICROSCOPIC  PREGNANCY, URINE  HIV ANTIBODY (ROUTINE TESTING)    Imaging Review No results found.   EKG Interpretation None      MDM   Final diagnoses:  Dyspareunia    Filed Vitals:   06/29/14 1244  BP: 121/70  Pulse: 85  Temp: 98.7 F (37.1 C)  Resp: 18   Afebrile, NAD, non-toxic appearing, AAOx4.  I have reviewed nursing notes, vital signs, and all appropriate lab and imaging results for this patient. Abdomen soft, nontender, nondistended. Pelvic exam unremarkable. No cervical motion tenderness, fullness or mass on pelvic examination. Wet prep, urine was unremarkable. Advised patient we could not redo the pap smear in the ER. Advised Ob/Gyn follow up. Return precautions discussed. Patient is agreeable to plan. Patient is stable at time of discharge       Jeannetta Ellis, PA-C 06/29/14 1351

## 2014-06-29 NOTE — ED Notes (Signed)
PA and Tech at bedside performing pelvic

## 2014-06-30 LAB — GC/CHLAMYDIA PROBE AMP
CT Probe RNA: NEGATIVE
GC Probe RNA: NEGATIVE

## 2014-06-30 LAB — HIV ANTIBODY (ROUTINE TESTING W REFLEX): HIV 1&2 Ab, 4th Generation: NONREACTIVE

## 2014-06-30 NOTE — ED Provider Notes (Signed)
Medical screening examination/treatment/procedure(s) were performed by non-physician practitioner and as supervising physician I was immediately available for consultation/collaboration.   EKG Interpretation None        Myria Steenbergen F Rondall Radigan, MD 06/30/14 1049 

## 2014-07-03 ENCOUNTER — Telehealth: Payer: Self-pay | Admitting: Family Medicine

## 2014-07-03 NOTE — Telephone Encounter (Signed)
ER letter sent 

## 2014-07-28 ENCOUNTER — Other Ambulatory Visit: Payer: Self-pay

## 2014-11-01 ENCOUNTER — Encounter (HOSPITAL_COMMUNITY): Payer: Self-pay | Admitting: *Deleted

## 2014-11-01 ENCOUNTER — Emergency Department (HOSPITAL_COMMUNITY)
Admission: EM | Admit: 2014-11-01 | Discharge: 2014-11-01 | Disposition: A | Discharge status: Home / Self Care | Payer: Medicaid Other | Attending: Emergency Medicine | Admitting: Emergency Medicine

## 2014-11-01 DIAGNOSIS — R102 Pelvic and perineal pain: Secondary | ICD-10-CM | POA: Insufficient documentation

## 2014-11-01 DIAGNOSIS — Z720 Tobacco use: Secondary | ICD-10-CM | POA: Insufficient documentation

## 2014-11-01 DIAGNOSIS — Z8619 Personal history of other infectious and parasitic diseases: Secondary | ICD-10-CM | POA: Insufficient documentation

## 2014-11-01 DIAGNOSIS — Z8639 Personal history of other endocrine, nutritional and metabolic disease: Secondary | ICD-10-CM | POA: Insufficient documentation

## 2014-11-01 DIAGNOSIS — R110 Nausea: Secondary | ICD-10-CM | POA: Insufficient documentation

## 2014-11-01 DIAGNOSIS — Z3202 Encounter for pregnancy test, result negative: Secondary | ICD-10-CM | POA: Insufficient documentation

## 2014-11-01 DIAGNOSIS — Z8659 Personal history of other mental and behavioral disorders: Secondary | ICD-10-CM | POA: Insufficient documentation

## 2014-11-01 LAB — URINALYSIS, ROUTINE W REFLEX MICROSCOPIC
Bilirubin Urine: NEGATIVE
Glucose, UA: NEGATIVE mg/dL
Ketones, ur: NEGATIVE mg/dL
Leukocytes, UA: NEGATIVE
Nitrite: NEGATIVE
Protein, ur: NEGATIVE mg/dL
Specific Gravity, Urine: 1.018 (ref 1.005–1.030)
Urobilinogen, UA: 0.2 mg/dL (ref 0.0–1.0)
pH: 5 (ref 5.0–8.0)

## 2014-11-01 LAB — URINE MICROSCOPIC-ADD ON: WBC, UA: NONE SEEN WBC/hpf (ref <3)

## 2014-11-01 LAB — GC/CHLAMYDIA PROBE AMP (~~LOC~~) NOT AT ARMC
Chlamydia: NEGATIVE
Neisseria Gonorrhea: NEGATIVE

## 2014-11-01 LAB — PREGNANCY, URINE: Preg Test, Ur: NEGATIVE

## 2014-11-01 LAB — WET PREP, GENITAL
Clue Cells Wet Prep HPF POC: NONE SEEN
Trich, Wet Prep: NONE SEEN
Yeast Wet Prep HPF POC: NONE SEEN

## 2014-11-01 MED ORDER — NAPROXEN 500 MG PO TABS: 500.0000 mg | ORAL_TABLET | Freq: Two times a day (BID) | ORAL | Status: DC

## 2014-11-01 MED ORDER — OXYCODONE-ACETAMINOPHEN 5-325 MG PO TABS
1.0000 | ORAL_TABLET | Freq: Once | ORAL | Status: AC
  Administered 2014-11-01: 1 via ORAL
  Filled 2014-11-01: qty 1

## 2014-11-01 MED ORDER — ONDANSETRON 8 MG PO TBDP: 8.0000 mg | ORAL_TABLET | Freq: Three times a day (TID) | ORAL | Status: DC | PRN

## 2014-11-01 NOTE — Discharge Instructions (Signed)
Abdominal Pain, Women °Abdominal (stomach, pelvic, or belly) pain can be caused by many things. It is important to tell your doctor: °· The location of the pain. °· Does it come and go or is it present all the time? °· Are there things that start the pain (eating certain foods, exercise)? °· Are there other symptoms associated with the pain (fever, nausea, vomiting, diarrhea)? °All of this is helpful to know when trying to find the cause of the pain. °CAUSES  °· Stomach: virus or bacteria infection, or ulcer. °· Intestine: appendicitis (inflamed appendix), regional ileitis (Crohn's disease), ulcerative colitis (inflamed colon), irritable bowel syndrome, diverticulitis (inflamed diverticulum of the colon), or cancer of the stomach or intestine. °· Gallbladder disease or stones in the gallbladder. °· Kidney disease, kidney stones, or infection. °· Pancreas infection or cancer. °· Fibromyalgia (pain disorder). °· Diseases of the female organs: °¨ Uterus: fibroid (non-cancerous) tumors or infection. °¨ Fallopian tubes: infection or tubal pregnancy. °¨ Ovary: cysts or tumors. °¨ Pelvic adhesions (scar tissue). °¨ Endometriosis (uterus lining tissue growing in the pelvis and on the pelvic organs). °¨ Pelvic congestion syndrome (female organs filling up with blood just before the menstrual period). °¨ Pain with the menstrual period. °¨ Pain with ovulation (producing an egg). °¨ Pain with an IUD (intrauterine device, birth control) in the uterus. °¨ Cancer of the female organs. °· Functional pain (pain not caused by a disease, may improve without treatment). °· Psychological pain. °· Depression. °DIAGNOSIS  °Your doctor will decide the seriousness of your pain by doing an examination. °· Blood tests. °· X-rays. °· Ultrasound. °· CT scan (computed tomography, special type of X-ray). °· MRI (magnetic resonance imaging). °· Cultures, for infection. °· Barium enema (dye inserted in the large intestine, to better view it with  X-rays). °· Colonoscopy (looking in intestine with a lighted tube). °· Laparoscopy (minor surgery, looking in abdomen with a lighted tube). °· Major abdominal exploratory surgery (looking in abdomen with a large incision). °TREATMENT  °The treatment will depend on the cause of the pain.  °· Many cases can be observed and treated at home. °· Over-the-counter medicines recommended by your caregiver. °· Prescription medicine. °· Antibiotics, for infection. °· Birth control pills, for painful periods or for ovulation pain. °· Hormone treatment, for endometriosis. °· Nerve blocking injections. °· Physical therapy. °· Antidepressants. °· Counseling with a psychologist or psychiatrist. °· Minor or major surgery. °HOME CARE INSTRUCTIONS  °· Do not take laxatives, unless directed by your caregiver. °· Take over-the-counter pain medicine only if ordered by your caregiver. Do not take aspirin because it can cause an upset stomach or bleeding. °· Try a clear liquid diet (broth or water) as ordered by your caregiver. Slowly move to a bland diet, as tolerated, if the pain is related to the stomach or intestine. °· Have a thermometer and take your temperature several times a day, and record it. °· Bed rest and sleep, if it helps the pain. °· Avoid sexual intercourse, if it causes pain. °· Avoid stressful situations. °· Keep your follow-up appointments and tests, as your caregiver orders. °· If the pain does not go away with medicine or surgery, you may try: °¨ Acupuncture. °¨ Relaxation exercises (yoga, meditation). °¨ Group therapy. °¨ Counseling. °SEEK MEDICAL CARE IF:  °· You notice certain foods cause stomach pain. °· Your home care treatment is not helping your pain. °· You need stronger pain medicine. °· You want your IUD removed. °· You feel faint or   lightheaded. °· You develop nausea and vomiting. °· You develop a rash. °· You are having side effects or an allergy to your medicine. °SEEK IMMEDIATE MEDICAL CARE IF:  °· Your  pain does not go away or gets worse. °· You have a fever. °· Your pain is felt only in portions of the abdomen. The right side could possibly be appendicitis. The left lower portion of the abdomen could be colitis or diverticulitis. °· You are passing blood in your stools (bright red or black tarry stools, with or without vomiting). °· You have blood in your urine. °· You develop chills, with or without a fever. °· You pass out. °MAKE SURE YOU:  °· Understand these instructions. °· Will watch your condition. °· Will get help right away if you are not doing well or get worse. °Document Released: 07/27/2007 Document Revised: 02/13/2014 Document Reviewed: 08/16/2009 °[employer]® Patient Information ©2015 [employer]. This information is not intended to replace advice given to you by your health care provider. Make sure you discuss any questions you have with your health care provider. ° °

## 2014-11-01 NOTE — ED Notes (Signed)
Lab at the bedside 

## 2014-11-01 NOTE — ED Notes (Signed)
Pt reports lower abd pain x 1 week, has nausea after she eats. Denies diarrhea. Denies vaginal or urinary symptoms.

## 2014-11-01 NOTE — ED Provider Notes (Signed)
CSN: [id number]     Arrival date & time 11/01/14  0809 History   First MD Initiated Contact with Patient 11/01/14 0813     Chief Complaint  Patient presents with  . Abdominal Pain     HPI Patient ports mild lower abdominal pain with some nausea.  Denies vomiting.  No urinary symptoms.  Denies low back pain.  She denies significant vaginal discharge but does report pain with intercourse over the past year.  She has been working with her OB/GYN.  She states that she had some cervical dysplasia and is supposed to go back for repeat procedure regarding this but has missed her appointment.  She denies weight loss.  No fevers or chills.  Pain is mild to moderate in severity.   Past Medical History  Diagnosis Date  . High cholesterol   . Chlamydia     age 24yo; hx/o trichomonas age 34yo  . Chronic headache   . Depression   . Allergy    Past Surgical History  Procedure Laterality Date  . Wisdom tooth extraction     Family History  Problem Relation Age of Onset  . Hypertension Mother   . Diabetes Mother   . Asthma Sister   . Cancer Paternal Grandmother   . Cancer Paternal Grandfather   . Heart disease Neg Hx   . Stroke Neg Hx    History  Substance Use Topics  . Smoking status: Current Every Day Smoker -- 1.00 packs/day for 12 years    Types: Cigarettes  . Smokeless tobacco: Never Used     Comment: quit x 3 wk as of 03/2012  . Alcohol Use: Yes     Comment: Occasionally   OB History    No data available     Review of Systems  All other systems reviewed and are negative.     Allergies  Review of patient's allergies indicates no known allergies.  Home Medications   Prior to Admission medications   Medication Sig Start Date End Date Taking? Authorizing Provider  Etonogestrel (IMPLANON [place]) Inject into the skin.    Historical Provider, MD  naproxen (NAPROSYN) 500 MG tablet Take 1 tablet (500 mg total) by mouth 2 (two) times daily. 11/01/14   Lyanne Co, MD   BP  111/68 mmHg  Pulse 72  Temp(Src) 98.6 F (37 C) (Oral)  Resp 16  SpO2 99% Physical Exam  Constitutional: She is oriented to person, place, and time. She appears well-developed and well-nourished. No distress.  HENT:  Head: Normocephalic and atraumatic.  Eyes: EOM are normal.  Neck: Normal range of motion.  Cardiovascular: Normal rate, regular rhythm and normal heart sounds.   Pulmonary/Chest: Effort normal and breath sounds normal.  Abdominal: Soft. She exhibits no distension. There is no tenderness.  Genitourinary:  Normal external genitalia.  No obvious cervical masses or erosions noted.  No cervical discharge.  No cervical motion tenderness.  No adnexal masses or fullness.  No significant vaginal discharge.  No abnormal vaginal bleeding.  Musculoskeletal: Normal range of motion.  Neurological: She is alert and oriented to person, place, and time.  Skin: Skin is warm and dry.  Psychiatric: She has a normal mood and affect. Judgment normal.  Nursing note and vitals reviewed.   ED Course  Procedures (including critical care time) Labs Review Labs Reviewed  WET PREP, GENITAL - Abnormal; Notable for the following:    WBC, Wet Prep HPF POC MANY (*)    All other components within  normal limits  URINALYSIS, ROUTINE W REFLEX MICROSCOPIC - Abnormal; Notable for the following:    Hgb urine dipstick SMALL (*)    All other components within normal limits  PREGNANCY, URINE  URINE MICROSCOPIC-ADD ON  RPR  HIV ANTIBODY (ROUTINE TESTING)  GC/CHLAMYDIA PROBE AMP ([place])    Imaging Review No results found.   EKG Interpretation None      MDM   Final diagnoses:  Pelvic pain in female    Unclear etiology of ongoing low abdominal pelvic pain.  Review the chart demonstrates this is been occurring for several months.  I will refer her back to her OB/GYN for ongoing care.  No indication for advanced imaging here.  Doubt appendicitis.  Vital signs otherwise normal.    Lyanne CoKevin M  Colinda Barth, MD 11/01/14 1022

## 2014-11-02 LAB — HIV ANTIBODY (ROUTINE TESTING W REFLEX)
HIV 1/O/2 Abs-Index Value: <1 (ref <1.00)
HIV-1/HIV-2 Ab: NONREACTIVE

## 2014-11-02 LAB — RPR: RPR Ser Ql: NONREACTIVE

## 2014-11-20 ENCOUNTER — Encounter (HOSPITAL_COMMUNITY): Payer: Self-pay | Admitting: Emergency Medicine

## 2014-11-20 ENCOUNTER — Emergency Department (INDEPENDENT_AMBULATORY_CARE_PROVIDER_SITE_OTHER)
Admission: EM | Admit: 2014-11-20 | Discharge: 2014-11-20 | Disposition: A | Discharge status: Home / Self Care | Payer: Medicaid Other | Source: Home / Self Care | Attending: Family Medicine | Admitting: Family Medicine

## 2014-11-20 ENCOUNTER — Telehealth: Payer: Self-pay | Admitting: Family Medicine

## 2014-11-20 ENCOUNTER — Emergency Department (INDEPENDENT_AMBULATORY_CARE_PROVIDER_SITE_OTHER): Payer: Self-pay

## 2014-11-20 DIAGNOSIS — J111 Influenza due to unidentified influenza virus with other respiratory manifestations: Secondary | ICD-10-CM

## 2014-11-20 DIAGNOSIS — R69 Illness, unspecified: Principal | ICD-10-CM

## 2014-11-20 MED ORDER — HYDROCODONE-HOMATROPINE 5-1.5 MG/5ML PO SYRP: 5.0000 mL | ORAL_SOLUTION | Freq: Four times a day (QID) | ORAL | Status: DC | PRN

## 2014-11-20 NOTE — ED Notes (Addendum)
Body aches, fever 100.8 this am per patient, sore throat, ears hurt, no nausea, no vomiting, no diarrhea.  Onset Saturday night.  Reports her daughter was sick recently

## 2014-11-20 NOTE — ED Provider Notes (Signed)
CSN: [id number]     Arrival date & time 11/20/14  0807 History   First MD Initiated Contact with Patient 11/20/14 [phone]     Chief Complaint  Patient presents with  . URI   (Consider location/radiation/quality/duration/timing/severity/associated sxs/prior Treatment) HPI      24 year old female presents for evaluation of cough, sore throat, body aches. Her symptoms started Saturday night into Sunday morning. She also has some dizziness. She has exposure to other people with a similar illness. She did not get her flu shot this year. Her temperature has been to 101F at home. No recent travel.  Past Medical History  Diagnosis Date  . High cholesterol   . Chlamydia     age 77yo; hx/o trichomonas age 58yo  . Chronic headache   . Depression   . Allergy    Past Surgical History  Procedure Laterality Date  . Wisdom tooth extraction     Family History  Problem Relation Age of Onset  . Hypertension Mother   . Diabetes Mother   . Asthma Sister   . Cancer Paternal Grandmother   . Cancer Paternal Grandfather   . Heart disease Neg Hx   . Stroke Neg Hx    History  Substance Use Topics  . Smoking status: Current Every Day Smoker -- 1.00 packs/day for 12 years    Types: Cigarettes  . Smokeless tobacco: Never Used     Comment: quit x 3 wk as of 03/2012  . Alcohol Use: Yes     Comment: Occasionally   OB History    No data available     Review of Systems  Constitutional: Positive for fever and chills.  HENT: Positive for congestion, ear pain, rhinorrhea and sore throat. Negative for sinus pressure.   Respiratory: Positive for cough. Negative for chest tightness, shortness of breath and wheezing.   Cardiovascular: Negative for chest pain.  Gastrointestinal: Negative for nausea, vomiting, abdominal pain and diarrhea.  Musculoskeletal: Positive for myalgias and arthralgias.  All other systems reviewed and are negative.   Allergies  Review of patient's allergies indicates no known  allergies.  Home Medications   Prior to Admission medications   Medication Sig Start Date End Date Taking? Authorizing Provider  brompheniramine-pseudoephedrine (DIMETAPP) 1-15 MG/5ML ELIX Take by mouth 2 (two) times daily as needed for allergies.   Yes Historical Provider, MD  Etonogestrel (IMPLANON [place]) Inject into the skin.    Historical Provider, MD  HYDROcodone-homatropine (HYCODAN) 5-1.5 MG/5ML syrup Take 5 mLs by mouth every 6 (six) hours as needed for cough. 11/20/14   Graylon Good, PA-C  naproxen (NAPROSYN) 500 MG tablet Take 1 tablet (500 mg total) by mouth 2 (two) times daily. 11/01/14   Lyanne Co, MD  ondansetron (ZOFRAN ODT) 8 MG disintegrating tablet Take 1 tablet (8 mg total) by mouth every 8 (eight) hours as needed for nausea or vomiting. 11/01/14   Lyanne Co, MD   BP 113/82 mmHg  Pulse 103  Temp(Src) 100.8 F (38.2 C) (Oral)  Resp 18  SpO2 99%  LMP 11/20/2014 Physical Exam  Constitutional: She is oriented to person, place, and time. Vital signs are normal. She appears well-developed and well-nourished. She appears ill (mildly ). No distress.  HENT:  Head: Normocephalic and atraumatic.  Right Ear: Tympanic membrane, external ear and ear canal normal.  Left Ear: Tympanic membrane, external ear and ear canal normal.  Nose: Nose normal. Right sinus exhibits no maxillary sinus tenderness and no frontal sinus tenderness. Left  sinus exhibits no maxillary sinus tenderness and no frontal sinus tenderness.  Mouth/Throat: Uvula is midline, oropharynx is clear and moist and mucous membranes are normal.  Eyes: Conjunctivae are normal. Right eye exhibits no discharge. Left eye exhibits no discharge.  Neck: Normal range of motion. Neck supple.  Cardiovascular: Normal rate, regular rhythm and normal heart sounds.   Pulmonary/Chest: Effort normal and breath sounds normal. No respiratory distress.  Lymphadenopathy:       Head (right side): No tonsillar adenopathy present.        Head (left side): No tonsillar adenopathy present.    She has no cervical adenopathy.  Neurological: She is alert and oriented to person, place, and time. She has normal strength. Coordination normal.  Skin: Skin is warm and dry. No rash noted. She is not diaphoretic.  Psychiatric: She has a normal mood and affect. Judgment normal.  Nursing note and vitals reviewed.   ED Course  Procedures (including critical care time) Labs Review Labs Reviewed - No data to display  Imaging Review Dg Chest 2 View  11/20/2014   CLINICAL DATA:  Cough and fever for 3 days.  EXAM: CHEST  2 VIEW  COMPARISON:  June 26, 2013.  FINDINGS: The heart size and mediastinal contours are within normal limits. Both lungs are clear. No pneumothorax or pleural effusion is noted. The visualized skeletal structures are unremarkable.  IMPRESSION: No acute cardiopulmonary abnormality seen.   Electronically Signed   By: Roque LiasJames  Green M.D.   On: 11/20/2014 09:03     MDM   1. Influenza-like illness    CXR normal.  PE normal.  Treat symptomatically.  F/u PRN    Meds ordered this encounter  Medications  . brompheniramine-pseudoephedrine (DIMETAPP) 1-15 MG/5ML ELIX    Sig: Take by mouth 2 (two) times daily as needed for allergies.  Marland Kitchen. HYDROcodone-homatropine (HYCODAN) 5-1.5 MG/5ML syrup    Sig: Take 5 mLs by mouth every 6 (six) hours as needed for cough.    Dispense:  120 mL    Refill:  0       Graylon GoodZachary H Haliyah Fryman, PA-C 11/20/14 [phone]

## 2014-11-20 NOTE — Discharge Instructions (Signed)

## 2014-11-20 NOTE — Telephone Encounter (Signed)
ER letter sent 

## 2014-11-22 LAB — CULTURE, GROUP A STREP

## 2015-05-21 ENCOUNTER — Encounter (HOSPITAL_COMMUNITY): Payer: Self-pay | Admitting: Emergency Medicine

## 2015-05-21 DIAGNOSIS — G8929 Other chronic pain: Secondary | ICD-10-CM | POA: Diagnosis not present

## 2015-05-21 DIAGNOSIS — A084 Viral intestinal infection, unspecified: Secondary | ICD-10-CM | POA: Insufficient documentation

## 2015-05-21 DIAGNOSIS — Z720 Tobacco use: Secondary | ICD-10-CM | POA: Insufficient documentation

## 2015-05-21 DIAGNOSIS — Z8639 Personal history of other endocrine, nutritional and metabolic disease: Secondary | ICD-10-CM | POA: Insufficient documentation

## 2015-05-21 DIAGNOSIS — Z791 Long term (current) use of non-steroidal anti-inflammatories (NSAID): Secondary | ICD-10-CM | POA: Insufficient documentation

## 2015-05-21 DIAGNOSIS — Z8659 Personal history of other mental and behavioral disorders: Secondary | ICD-10-CM | POA: Insufficient documentation

## 2015-05-21 DIAGNOSIS — Z3202 Encounter for pregnancy test, result negative: Secondary | ICD-10-CM | POA: Diagnosis not present

## 2015-05-21 DIAGNOSIS — R1012 Left upper quadrant pain: Secondary | ICD-10-CM | POA: Diagnosis present

## 2015-05-21 NOTE — ED Notes (Signed)
Pt c/o lower abdominal pain and distension this week with nv and frequent BM. Pt sts bladder feels uncomfortable when full.

## 2015-05-22 ENCOUNTER — Emergency Department (HOSPITAL_COMMUNITY)
Admission: EM | Admit: 2015-05-22 | Discharge: 2015-05-22 | Disposition: A | Discharge status: Home / Self Care | Payer: Medicaid Other | Attending: Emergency Medicine | Admitting: Emergency Medicine

## 2015-05-22 DIAGNOSIS — A084 Viral intestinal infection, unspecified: Secondary | ICD-10-CM

## 2015-05-22 LAB — CBC
HCT: 37.7 % (ref 36.0–46.0)
Hemoglobin: 13 g/dL (ref 12.0–15.0)
MCH: 30.2 pg (ref 26.0–34.0)
MCHC: 34.5 g/dL (ref 30.0–36.0)
MCV: 87.7 fL (ref 78.0–100.0)
Platelets: 195 10*3/uL (ref 150–400)
RBC: 4.3 MIL/uL (ref 3.87–5.11)
RDW: 13.4 % (ref 11.5–15.5)
WBC: 7.7 10*3/uL (ref 4.0–10.5)

## 2015-05-22 LAB — COMPREHENSIVE METABOLIC PANEL
ALT: 19 U/L (ref 14–54)
AST: 23 U/L (ref 15–41)
Albumin: 3.8 g/dL (ref 3.5–5.0)
Alkaline Phosphatase: 57 U/L (ref 38–126)
Anion gap: 7 (ref 5–15)
BUN: 8 mg/dL (ref 6–20)
CO2: 27 mmol/L (ref 22–32)
Calcium: 8.9 mg/dL (ref 8.9–10.3)
Chloride: 105 mmol/L (ref 101–111)
Creatinine, Ser: 1.01 mg/dL — ABNORMAL HIGH (ref 0.44–1.00)
GFR calc Af Amer: >60 mL/min (ref >60)
GFR calc non Af Amer: >60 mL/min (ref >60)
Glucose, Bld: 108 mg/dL — ABNORMAL HIGH (ref 65–99)
Potassium: 3.6 mmol/L (ref 3.5–5.1)
Sodium: 139 mmol/L (ref 135–145)
Total Bilirubin: 0.1 mg/dL — ABNORMAL LOW (ref 0.3–1.2)
Total Protein: 6.8 g/dL (ref 6.5–8.1)

## 2015-05-22 LAB — URINALYSIS, ROUTINE W REFLEX MICROSCOPIC
Bilirubin Urine: NEGATIVE
Glucose, UA: NEGATIVE mg/dL
Ketones, ur: NEGATIVE mg/dL
Leukocytes, UA: NEGATIVE
Nitrite: NEGATIVE
Protein, ur: NEGATIVE mg/dL
Specific Gravity, Urine: 1.016 (ref 1.005–1.030)
Urobilinogen, UA: 0.2 mg/dL (ref 0.0–1.0)
pH: 6 (ref 5.0–8.0)

## 2015-05-22 LAB — I-STAT BETA HCG BLOOD, ED (MC, WL, AP ONLY): I-stat hCG, quantitative: <5 m[IU]/mL (ref <5)

## 2015-05-22 LAB — URINE MICROSCOPIC-ADD ON

## 2015-05-22 MED ORDER — ONDANSETRON 4 MG PO TBDP: 4.0000 mg | ORAL_TABLET | Freq: Three times a day (TID) | ORAL | Status: DC | PRN

## 2015-05-22 MED ORDER — ONDANSETRON 4 MG PO TBDP
4.0000 mg | ORAL_TABLET | Freq: Once | ORAL | Status: AC
  Administered 2015-05-22: 4 mg via ORAL
  Filled 2015-05-22: qty 1

## 2015-05-22 NOTE — Discharge Instructions (Signed)
Take Zofran as needed for nausea. Refer to attached documents for more information.

## 2015-05-22 NOTE — ED Provider Notes (Signed)
CSN: [id number]     Arrival date & time 05/21/15  2328 History   First MD Initiated Contact with Patient 05/22/15 0209     Chief Complaint  Patient presents with  . Abdominal Pain     (Consider location/radiation/quality/duration/timing/severity/associated sxs/prior Treatment) HPI Comments: Patient is a 24 year old female who presents with a 1 week history of abdominal pain. The pain is located in her left upper abdomen and does not radiate. The pain is described as aching and moderate. The pain started gradually and progressively worsened since the onset. No alleviating/aggravating factors. The patient has tried nothing for symptoms without relief. Associated symptoms include NVD. Patient denies fever, headache, chest pain, SOB, dysuria, constipation, abnormal vaginal bleeding/discharge. No history of abdominal surgery.      Past Medical History  Diagnosis Date  . High cholesterol   . Chlamydia     age 78yo; hx/o trichomonas age 72yo  . Chronic headache   . Depression   . Allergy    Past Surgical History  Procedure Laterality Date  . Wisdom tooth extraction     Family History  Problem Relation Age of Onset  . Hypertension Mother   . Diabetes Mother   . Asthma Sister   . Cancer Paternal Grandmother   . Cancer Paternal Grandfather   . Heart disease Neg Hx   . Stroke Neg Hx    History  Substance Use Topics  . Smoking status: Current Every Day Smoker -- 1.00 packs/day for 12 years    Types: Cigarettes  . Smokeless tobacco: Never Used     Comment: quit x 3 wk as of 03/2012  . Alcohol Use: Yes     Comment: Occasionally   OB History    No data available     Review of Systems  Constitutional: Negative for fever, chills and fatigue.  HENT: Negative for trouble swallowing.   Eyes: Negative for visual disturbance.  Respiratory: Negative for shortness of breath.   Cardiovascular: Negative for chest pain and palpitations.  Gastrointestinal: Positive for nausea, vomiting,  abdominal pain and diarrhea.  Genitourinary: Negative for dysuria and difficulty urinating.  Musculoskeletal: Negative for arthralgias and neck pain.  Skin: Negative for color change.  Neurological: Negative for dizziness and weakness.  Psychiatric/Behavioral: Negative for dysphoric mood.      Allergies  Review of patient's allergies indicates no known allergies.  Home Medications   Prior to Admission medications   Medication Sig Start Date End Date Taking? Authorizing Provider  brompheniramine-pseudoephedrine (DIMETAPP) 1-15 MG/5ML ELIX Take by mouth 2 (two) times daily as needed for allergies.    Historical Provider, MD  Etonogestrel (IMPLANON [place]) Inject into the skin.    Historical Provider, MD  HYDROcodone-homatropine (HYCODAN) 5-1.5 MG/5ML syrup Take 5 mLs by mouth every 6 (six) hours as needed for cough. 11/20/14   Graylon Good, PA-C  naproxen (NAPROSYN) 500 MG tablet Take 1 tablet (500 mg total) by mouth 2 (two) times daily. 11/01/14   Azalia Bilis, MD  ondansetron (ZOFRAN ODT) 8 MG disintegrating tablet Take 1 tablet (8 mg total) by mouth every 8 (eight) hours as needed for nausea or vomiting. 11/01/14   Azalia Bilis, MD   BP 133/81 mmHg  Pulse 101  Temp(Src) 98.7 F (37.1 C) (Oral)  Resp 16  Ht  (1.499 m)  Wt 185 lb (83.915 kg)  BMI 37.35 kg/m2  SpO2 99% Physical Exam  Constitutional: She is oriented to person, place, and time. She appears well-developed and well-nourished.  No distress.  HENT:  Head: Normocephalic and atraumatic.  Eyes: Conjunctivae and EOM are normal.  Neck: Normal range of motion.  Cardiovascular: Normal rate and regular rhythm.  Exam reveals no gallop and no friction rub.   No murmur heard. Pulmonary/Chest: Effort normal and breath sounds normal. She has no wheezes. She has no rales. She exhibits no tenderness.  Abdominal: Soft. She exhibits no distension. There is no tenderness. There is no rebound.  Musculoskeletal: Normal range of motion.   Neurological: She is alert and oriented to person, place, and time. Coordination normal.  Speech is goal-oriented. Moves limbs without ataxia.   Skin: Skin is warm and dry.  Psychiatric: She has a normal mood and affect. Her behavior is normal.  Nursing note and vitals reviewed.   ED Course  Procedures (including critical care time) Labs Review Labs Reviewed  COMPREHENSIVE METABOLIC PANEL - Abnormal; Notable for the following:    Glucose, Bld 108 (*)    Creatinine, Ser 1.01 (*)    Total Bilirubin 0.1 (*)    All other components within normal limits  URINALYSIS, ROUTINE W REFLEX MICROSCOPIC (NOT AT [facility]) - Abnormal; Notable for the following:    Hgb urine dipstick MODERATE (*)    All other components within normal limits  CBC  URINE MICROSCOPIC-ADD ON  I-STAT BETA HCG BLOOD, ED ([facility], [facility], [facility] ONLY)    Imaging Review No results found.   EKG Interpretation None      MDM   Final diagnoses:  Viral gastroenteritis    2:34 AM Patient's labs and urinalysis unremarkable for acute changes. Vitals stable and patient afebrile. Patient will have ODT zofran and PO challenge.     [address] [place], PA-C 05/22/15 0448  Dione Booze, MD 05/22/15 [phone]

## 2015-06-25 ENCOUNTER — Encounter (HOSPITAL_COMMUNITY): Payer: Self-pay | Admitting: Emergency Medicine

## 2015-06-25 ENCOUNTER — Emergency Department (HOSPITAL_COMMUNITY)
Admission: EM | Admit: 2015-06-25 | Discharge: 2015-06-25 | Disposition: A | Discharge status: Home / Self Care | Payer: Medicaid Other | Attending: Emergency Medicine | Admitting: Emergency Medicine

## 2015-06-25 DIAGNOSIS — J069 Acute upper respiratory infection, unspecified: Secondary | ICD-10-CM | POA: Insufficient documentation

## 2015-06-25 DIAGNOSIS — Z720 Tobacco use: Secondary | ICD-10-CM | POA: Diagnosis not present

## 2015-06-25 DIAGNOSIS — J029 Acute pharyngitis, unspecified: Secondary | ICD-10-CM

## 2015-06-25 DIAGNOSIS — Z8659 Personal history of other mental and behavioral disorders: Secondary | ICD-10-CM | POA: Insufficient documentation

## 2015-06-25 DIAGNOSIS — Z8619 Personal history of other infectious and parasitic diseases: Secondary | ICD-10-CM | POA: Insufficient documentation

## 2015-06-25 DIAGNOSIS — Z8679 Personal history of other diseases of the circulatory system: Secondary | ICD-10-CM | POA: Diagnosis not present

## 2015-06-25 MED ORDER — IBUPROFEN 800 MG PO TABS
800.0000 mg | ORAL_TABLET | Freq: Once | ORAL | Status: AC
  Administered 2015-06-25: 800 mg via ORAL
  Filled 2015-06-25: qty 1

## 2015-06-25 NOTE — ED Notes (Signed)
Pt. reports sore throat/swelling with occasional dry cough onset this afternoon , denies fever or chills, respirations unlabored /airway intact .

## 2015-06-25 NOTE — ED Provider Notes (Signed)
TIME SEEN: 1:20 AM   CHIEF COMPLAINT: Sore throat  HPI: HPI Comments: Katelyn Mcfarland is a 24 y.o. female who presents to the Emergency Department complaining of new sore throat onset yesterday afternoon around 5 PM. Pt states she has associated dry cough. Pt denies any recent sick contacts. Pt denies any recent travel outside of the country. Pt denies any medications PTA. Denies fever or n/v/d.    ROS: See HPI Constitutional: no fever  Eyes: no drainage  ENT: no runny nose   Cardiovascular:  no chest pain  Resp: no SOB  GI: no vomiting GU: no dysuria Integumentary: no rash  Allergy: no hives  Musculoskeletal: no leg swelling  Neurological: no slurred speech ROS otherwise negative  PAST MEDICAL HISTORY/PAST SURGICAL HISTORY:  Past Medical History  Diagnosis Date  . High cholesterol   . Chlamydia     age 27yo; hx/o trichomonas age 75yo  . Chronic headache   . Depression   . Allergy     MEDICATIONS:  Prior to Admission medications   Medication Sig Start Date End Date Taking? Authorizing Provider  Etonogestrel (IMPLANON ) Inject 1 application into the skin continuous.     Historical Provider, MD  ibuprofen (ADVIL,MOTRIN) 200 MG tablet Take 400-600 mg by mouth every 6 (six) hours as needed for moderate pain.    Historical Provider, MD  ondansetron (ZOFRAN ODT) 4 MG disintegrating tablet Take 1 tablet (4 mg total) by mouth every 8 (eight) hours as needed for nausea or vomiting. 05/22/15   Emilia Beck, PA-C    ALLERGIES:  No Known Allergies  SOCIAL HISTORY:  Social History  Substance Use Topics  . Smoking status: Current Every Day Smoker -- 1.00 packs/day for 12 years    Types: Cigarettes  . Smokeless tobacco: Never Used     Comment: quit x 3 wk as of 03/2012  . Alcohol Use: Yes     Comment: Occasionally    FAMILY HISTORY: Family History  Problem Relation Age of Onset  . Hypertension Mother   . Diabetes Mother   . Asthma Sister   . Cancer Paternal  Grandmother   . Cancer Paternal Grandfather   . Heart disease Neg Hx   . Stroke Neg Hx     EXAM: BP 102/66 mmHg  Pulse 84  Temp(Src) 99.3 F (37.4 C) (Oral)  Resp 16  SpO2 99%   CONSTITUTIONAL: Alert and oriented and responds appropriately to questions. Well-appearing; well-nourished HEAD: Normocephalic EYES: Conjunctivae clear, PERRL ENT: normal nose; no rhinorrhea; moist mucous membranes; pharynx without lesions noted, no tonsillar hypertrophy or exudate, no uvular deviation, no trismus or drooling, no Ludwig's angina, normal phonation, no stridor, no sign of dental abscess NECK: Supple, no meningismus, no LAD  CARD: RRR; S1 and S2 appreciated; no murmurs, no clicks, no rubs, no gallops RESP: Normal chest excursion without splinting or tachypnea; breath sounds clear and equal bilaterally; no wheezes, no rhonchi, no rales, no hypoxia or respiratory distress, speaking full sentences ABD/GI: Normal bowel sounds; non-distended; soft, non-tender, no rebound, no guarding, no peritoneal signs BACK:  The back appears normal and is non-tender to palpation, there is no CVA tenderness EXT: Normal ROM in all joints; non-tender to palpation; no edema; normal capillary refill; no cyanosis, no calf tenderness or swelling    SKIN: Normal color for age and race; warm NEURO: Moves all extremities equally, sensation to light touch intact diffusely, cranial nerves II through XII intact PSYCH: The patient's mood and manner are appropriate.  Grooming and personal hygiene are appropriate.  MEDICAL DECISION MAKING: Patient here with likely viral pharyngitis. No tonsillar hypertrophy or exudate. No uvular deviation. No fever. She has a dry cough. I am not concerned for strep pharyngitis, deep space neck infection or peritonsillar abscess. I recommended symptomatic control with alternating ibuprofen and Tylenol, increase fluid intake and rest, salt water gargle. Discussed return precautions. She verbalized  understanding and is comfortable with plan. I do not feel there is any emergent medical condition present he feel she is safe to be discharged home without further workup in the emergency department.   I personally performed the services described in this documentation, which was scribed in my presence. The recorded information has been reviewed and is accurate.    Layla Maw Maela Takeda, DO 06/25/15 [phone]

## 2015-06-25 NOTE — Discharge Instructions (Signed)
You may alternate between Tylenol 1000 mg every hours as needed for pain and ibuprofen 800 mg every 8 hours as needed for pain.   Pharyngitis Pharyngitis is redness, pain, and swelling (inflammation) of your pharynx.  CAUSES  Pharyngitis is usually caused by infection. Most of the time, these infections are from viruses (viral) and are part of a cold. However, sometimes pharyngitis is caused by bacteria (bacterial). Pharyngitis can also be caused by allergies. Viral pharyngitis may be spread from person to person by coughing, sneezing, and personal items or utensils (cups, forks, spoons, toothbrushes). Bacterial pharyngitis may be spread from person to person by more intimate contact, such as kissing.  SIGNS AND SYMPTOMS  Symptoms of pharyngitis include:   Sore throat.   Tiredness (fatigue).   Low-grade fever.   Headache.  Joint pain and muscle aches.  Skin rashes.  Swollen lymph nodes.  Plaque-like film on throat or tonsils (often seen with bacterial pharyngitis). DIAGNOSIS  Your health care provider will ask you questions about your illness and your symptoms. Your medical history, along with a physical exam, is often all that is needed to diagnose pharyngitis. Sometimes, a rapid strep test is done. Other lab tests may also be done, depending on the suspected cause.  TREATMENT  Viral pharyngitis will usually get better in 3-4 days without the use of medicine. Bacterial pharyngitis is treated with medicines that kill germs (antibiotics).  HOME CARE INSTRUCTIONS   Drink enough water and fluids to keep your urine clear or pale yellow.   Only take over-the-counter or prescription medicines as directed by your health care provider:   If you are prescribed antibiotics, make sure you finish them even if you start to feel better.   Do not take aspirin.   Get lots of rest.   Gargle with 8 oz of salt water ( tsp of salt per 1 qt of water) as often as every 1-2 hours to  soothe your throat.   Throat lozenges (if you are not at risk for choking) or sprays may be used to soothe your throat. SEEK MEDICAL CARE IF:   You have large, tender lumps in your neck.  You have a rash.  You cough up green, yellow-brown, or bloody spit. SEEK IMMEDIATE MEDICAL CARE IF:   Your neck becomes stiff.  You drool or are unable to swallow liquids.  You vomit or are unable to keep medicines or liquids down.  You have severe pain that does not go away with the use of recommended medicines.  You have trouble breathing (not caused by a stuffy nose). MAKE SURE YOU:   Understand these instructions.  Will watch your condition.  Will get help right away if you are not doing well or get worse. Document Released: 09/29/2005 Document Revised: 07/20/2013 Document Reviewed: 06/06/2013 [facility] Patient Information 2015 [place], [place]. This information is not intended to replace advice given to you by your health care provider. Make sure you discuss any questions you have with your health care provider.  Salt Water Gargle This solution will help make your mouth and throat feel better. HOME CARE INSTRUCTIONS   Mix 1 teaspoon of salt in 8 ounces of warm water.  Gargle with this solution as much or often as you need or as directed. Swish and gargle gently if you have any sores or wounds in your mouth.  Do not swallow this mixture. Document Released: 07/03/2004 Document Revised: 12/22/2011 Document Reviewed: 11/24/2008 [facility] Patient Information 2015 [place], [place]. This information is  not intended to replace advice given to you by your health care provider. Make sure you discuss any questions you have with your health care provider. ° °

## 2015-06-25 NOTE — ED Notes (Signed)
Pt stable, ambulatory, states understanding of discharge instructions 

## 2015-06-26 ENCOUNTER — Encounter: Payer: Self-pay | Admitting: Family Medicine

## 2016-03-27 ENCOUNTER — Emergency Department (HOSPITAL_COMMUNITY)
Admission: EM | Admit: 2016-03-27 | Discharge: 2016-03-27 | Discharge status: Against Medical Advice | Payer: No Typology Code available for payment source

## 2016-03-27 NOTE — ED Notes (Signed)
Writer called for room assignment in triage, no response. 

## 2016-07-08 ENCOUNTER — Encounter: Payer: Self-pay | Admitting: *Deleted

## 2017-01-01 ENCOUNTER — Encounter (HOSPITAL_COMMUNITY): Payer: Self-pay | Admitting: Emergency Medicine

## 2017-01-01 ENCOUNTER — Emergency Department (HOSPITAL_COMMUNITY)
Admission: EM | Admit: 2017-01-01 | Discharge: 2017-01-02 | Disposition: A | Discharge status: Home / Self Care | Payer: Self-pay | Attending: Emergency Medicine | Admitting: Emergency Medicine

## 2017-01-01 DIAGNOSIS — R102 Pelvic and perineal pain: Secondary | ICD-10-CM | POA: Insufficient documentation

## 2017-01-01 DIAGNOSIS — F17210 Nicotine dependence, cigarettes, uncomplicated: Secondary | ICD-10-CM | POA: Insufficient documentation

## 2017-01-01 DIAGNOSIS — N739 Female pelvic inflammatory disease, unspecified: Secondary | ICD-10-CM | POA: Insufficient documentation

## 2017-01-01 LAB — CBC
HCT: 39.7 % (ref 36.0–46.0)
Hemoglobin: 13.3 g/dL (ref 12.0–15.0)
MCH: 29.4 pg (ref 26.0–34.0)
MCHC: 33.5 g/dL (ref 30.0–36.0)
MCV: 87.6 fL (ref 78.0–100.0)
Platelets: 227 10*3/uL (ref 150–400)
RBC: 4.53 MIL/uL (ref 3.87–5.11)
RDW: 13.1 % (ref 11.5–15.5)
WBC: 9.7 10*3/uL (ref 4.0–10.5)

## 2017-01-01 LAB — URINALYSIS, ROUTINE W REFLEX MICROSCOPIC
Bacteria, UA: NONE SEEN
Bilirubin Urine: NEGATIVE
Glucose, UA: NEGATIVE mg/dL
Ketones, ur: NEGATIVE mg/dL
Leukocytes, UA: NEGATIVE
Nitrite: NEGATIVE
Protein, ur: NEGATIVE mg/dL
Specific Gravity, Urine: 1.013 (ref 1.005–1.030)
pH: 6 (ref 5.0–8.0)

## 2017-01-01 LAB — COMPREHENSIVE METABOLIC PANEL
ALT: 10 U/L — ABNORMAL LOW (ref 14–54)
AST: 21 U/L (ref 15–41)
Albumin: 3.8 g/dL (ref 3.5–5.0)
Alkaline Phosphatase: 60 U/L (ref 38–126)
Anion gap: 9 (ref 5–15)
BUN: 8 mg/dL (ref 6–20)
CO2: 24 mmol/L (ref 22–32)
Calcium: 8.8 mg/dL — ABNORMAL LOW (ref 8.9–10.3)
Chloride: 105 mmol/L (ref 101–111)
Creatinine, Ser: 0.83 mg/dL (ref 0.44–1.00)
GFR calc Af Amer: >60 mL/min (ref >60)
GFR calc non Af Amer: >60 mL/min (ref >60)
Glucose, Bld: 84 mg/dL (ref 65–99)
Potassium: 3.6 mmol/L (ref 3.5–5.1)
Sodium: 138 mmol/L (ref 135–145)
Total Bilirubin: 0.5 mg/dL (ref 0.3–1.2)
Total Protein: 7.4 g/dL (ref 6.5–8.1)

## 2017-01-01 LAB — HCG, QUANTITATIVE, PREGNANCY: hCG, Beta Chain, Quant, S: <1 m[IU]/mL (ref <5)

## 2017-01-01 LAB — LIPASE, BLOOD: Lipase: 20 U/L (ref 11–51)

## 2017-01-01 NOTE — ED Provider Notes (Signed)
[facility]-EMER[facility]ENCY DEPT Provider Note   CSN: [id number] Arrival date & time: 01/01/17  2133     History   Chief Complaint Chief Complaint  Patient presents with  . Abdominal Cramping  . Emesis    HPI Katelyn Mcfarland is a 26 y.o. female.  26 year old female with a history of depression and dyslipidemia presents to the emergency department for complaints of lower abdominal pain. She states that she has had intermittent, waxing and waning, nonradiating abdominal pain over the past 5 days. Symptoms became associated with vomiting today. She denies any nausea currently. She has not had any fevers, bowel changes or diarrhea, dysuria, hematuria, vaginal bleeding, vaginal discharge. No history of abdominal surgeries. Patient reports being sexually active with 2 female partners in the past 6 months. She endorses inconsistent use of condoms.     Past Medical History:  Diagnosis Date  . Allergy   . Chlamydia    age 26yo; hx/o trichomonas age 89yo  . Chronic headache   . Depression   . High cholesterol     Patient Active Problem List   Diagnosis Date Noted  . Current smoker 01/11/2014    Past Surgical History:  Procedure Laterality Date  . WISDOM TOOTH EXTRACTION      OB History    No data available       Home Medications    Prior to Admission medications   Medication Sig Start Date End Date Taking? Authorizing Provider  Etonogestrel (IMPLANON [place]) Inject 1 application into the skin continuous.    Yes Historical Provider, MD  doxycycline (VIBRAMYCIN) 100 MG capsule Take 1 capsule (100 mg total) by mouth 2 (two) times daily. Take as prescribed until finished 01/02/17   Antony MaduraKelly Mikesha Migliaccio, PA-C  naproxen (NAPROSYN) 500 MG tablet Take 1 tablet (500 mg total) by mouth 2 (two) times daily. 01/02/17   Antony MaduraKelly Jahbari Repinski, PA-C  ondansetron (ZOFRAN ODT) 4 MG disintegrating tablet Take 1 tablet (4 mg total) by mouth every 8 (eight) hours as needed for nausea or vomiting. Patient not taking: Reported on  01/02/2017 05/22/15   Emilia BeckKaitlyn Szekalski, PA-C  promethazine (PHENERGAN) 25 MG tablet Take 1 tablet (25 mg total) by mouth every 6 (six) hours as needed for nausea or vomiting. 01/02/17   Antony MaduraKelly Padraig Nhan, PA-C    Family History Family History  Problem Relation Age of Onset  . Hypertension Mother   . Diabetes Mother   . Asthma Sister   . Cancer Paternal Grandmother   . Cancer Paternal Grandfather   . Heart disease Neg Hx   . Stroke Neg Hx     Social History Social History  Substance Use Topics  . Smoking status: Current Every Day Smoker    Packs/day: 0.00    Years: 12.00    Types: Cigarettes  . Smokeless tobacco: Never Used     Comment: quit x 3 wk as of 03/2012  . Alcohol use Yes     Comment: Occasionally     Allergies   Patient has no known allergies.   Review of Systems Review of Systems Ten systems reviewed and are negative for acute change, except as noted in the HPI.    Physical Exam Updated Vital Signs BP 115/66   Pulse 98   Temp 98.3 F (36.8 C)   Resp 18   Ht 4\' 11"  (1.499 m)   Wt 89.8 kg   LMP 12/10/2016 (Approximate)   SpO2 99%   BMI 39.99 kg/m   Physical Exam  Constitutional: She  is oriented to person, place, and time. She appears well-developed and well-nourished. No distress.  Nontoxic and in no acute distress  HENT:  Head: Normocephalic and atraumatic.  Eyes: Conjunctivae and EOM are normal. No scleral icterus.  Neck: Normal range of motion.  Cardiovascular: Normal rate, regular rhythm and intact distal pulses.   Pulmonary/Chest: Effort normal. No respiratory distress. She has no wheezes.  Respirations even and unlabored  Abdominal: Soft. She exhibits no mass. There is tenderness. There is no guarding.  Soft abdomen with minimal suprapubic tenderness. No masses or peritoneal signs.  Genitourinary: There is no rash, tenderness, lesion or injury on the right labia. There is no rash, tenderness, lesion or injury on the left labia. Cervix exhibits no  friability. No bleeding in the vagina. Vaginal discharge (scant, white) found.  Genitourinary Comments: Minimal CMT. No significant tenderness on palpation of uterus or adnexa.  Musculoskeletal: Normal range of motion.  Neurological: She is alert and oriented to person, place, and time. She exhibits normal muscle tone. Coordination normal.  Skin: Skin is warm and dry. No rash noted. She is not diaphoretic. No erythema. No pallor.  Psychiatric: She has a normal mood and affect. Her behavior is normal.  Nursing note and vitals reviewed.    ED Treatments / Results  Labs (all labs ordered are listed, but only abnormal results are displayed) Labs Reviewed  WET PREP, GENITAL - Abnormal; Notable for the following:       Result Value   Clue Cells Wet Prep HPF POC PRESENT (*)    WBC, Wet Prep HPF POC MODERATE (*)    All other components within normal limits  COMPREHENSIVE METABOLIC PANEL - Abnormal; Notable for the following:    Calcium 8.8 (*)    ALT 10 (*)    All other components within normal limits  URINALYSIS, ROUTINE W REFLEX MICROSCOPIC - Abnormal; Notable for the following:    Hgb urine dipstick SMALL (*)    Squamous Epithelial / LPF 0-5 (*)    All other components within normal limits  LIPASE, BLOOD  CBC  HCG, QUANTITATIVE, PREGNANCY  GC/CHLAMYDIA PROBE AMP ([place]) NOT AT [facility]    EKG  EKG Interpretation None       Radiology US Transvaginal Non-ob  Result Date: 01/02/2017 CLINICAL DATA:  Acute onset of pelvic pain.  Initial encounter. EXAM: TRANSABDOMINAL AND TRANSVAGINAL ULTRASOUND OF PELVIS TECHNIQUE: Both transabdominal and transvaginal ultrasound examinations of the pelvis were performed. Transabdominal technique was performed for global imaging of the pelvis including uterus, ovaries, adnexal regions, and pelvic cul-de-sac. It was necessary to proceed with endovaginal exam following the transabdominal exam to visualize the ovaries in greater detail. COMPARISON:   CT of the abdomen and pelvis performed 04/02/2012, and pelvic ultrasound performed 10/01/2009 FINDINGS: Uterus Measurements: 9.6 x 4.1 x 4.7 cm. No fibroids or other mass visualized. Endometrium Thickness: 0.9 cm.  No focal abnormality visualized. Right ovary Measurements: 2.1 x 1.2 x 1.0 cm. Normal appearance/no adnexal mass. Left ovary Measurements: 3.1 x 2.0 x 3.0 cm. Normal appearance/no adnexal mass. Other findings A small amount of free fluid is seen within the pelvic cul-de-sac. IMPRESSION: Unremarkable pelvic ultrasound.  No evidence for ovarian torsion. Electronically Signed   By: Roanna Raider M.D.   On: 01/02/2017 02:20   US Pelvis Complete  Result Date: 01/02/2017 CLINICAL DATA:  Acute onset of pelvic pain.  Initial encounter. EXAM: TRANSABDOMINAL AND TRANSVAGINAL ULTRASOUND OF PELVIS TECHNIQUE: Both transabdominal and transvaginal ultrasound examinations of the pelvis  were performed. Transabdominal technique was performed for global imaging of the pelvis including uterus, ovaries, adnexal regions, and pelvic cul-de-sac. It was necessary to proceed with endovaginal exam following the transabdominal exam to visualize the ovaries in greater detail. COMPARISON:  CT of the abdomen and pelvis performed 04/02/2012, and pelvic ultrasound performed 10/01/2009 FINDINGS: Uterus Measurements: 9.6 x 4.1 x 4.7 cm. No fibroids or other mass visualized. Endometrium Thickness: 0.9 cm.  No focal abnormality visualized. Right ovary Measurements: 2.1 x 1.2 x 1.0 cm. Normal appearance/no adnexal mass. Left ovary Measurements: 3.1 x 2.0 x 3.0 cm. Normal appearance/no adnexal mass. Other findings A small amount of free fluid is seen within the pelvic cul-de-sac. IMPRESSION: Unremarkable pelvic ultrasound.  No evidence for ovarian torsion. Electronically Signed   By: Roanna Raider M.D.   On: 01/02/2017 02:20    Procedures Procedures (including critical care time)  Medications Ordered in ED Medications    cefTRIAXone (ROCEPHIN) injection 250 mg (250 mg Intramuscular Given 01/02/17 0309)  lidocaine (PF) (XYLOCAINE) 1 % injection (5 mLs  Given 01/02/17 0309)     Initial Impression / Assessment and Plan / ED Course  I have reviewed the triage vital signs and the nursing notes.  Pertinent labs & imaging results that were available during my care of the patient were reviewed by me and considered in my medical decision making (see chart for details).     26 year old female presents to the emergency department for evaluation of 4 days of lower abdominal pain. She reports an episode of emesis today. Tenderness, on exam, is appreciated to be more focal to the suprapubic abdomen. Given 4 days of symptoms with lack of fever and leukocytosis, I have a low suspicion for appendicitis. There is also no tenderness at McBurney's point on exam.  Pelvic ultrasound performed which is reassuring. There is no evidence of TOA or ovarian cyst. No free fluid in the pelvis. Patient does have moderate white blood cells on wet prep. Her gonorrhea and chlamydia cultures are pending. Given persistent pelvic pain and history of unprotected sexual intercourse with multiple partners, will cover for pelvic inflammatory disease. Rocephin given. Patient to be discharged with a course of doxycycline. Return precautions discussed and provided. Patient discharged in stable condition with no unaddressed concerns.   Final Clinical Impressions(s) / ED Diagnoses   Final diagnoses:  Pelvic pain in female  PID (pelvic inflammatory disease)    New Prescriptions Discharge Medication List as of 01/02/2017  3:13 AM    START taking these medications   Details  doxycycline (VIBRAMYCIN) 100 MG capsule Take 1 capsule (100 mg total) by mouth 2 (two) times daily. Take as prescribed until finished, Starting Fri 01/02/2017, Print    naproxen (NAPROSYN) 500 MG tablet Take 1 tablet (500 mg total) by mouth 2 (two) times daily., Starting Fri  01/02/2017, Print    promethazine (PHENERGAN) 25 MG tablet Take 1 tablet (25 mg total) by mouth every 6 (six) hours as needed for nausea or vomiting., Starting Fri 01/02/2017, Print         Brian Head, PA-C 01/02/17 [id number]    Pricilla Loveless, MD 01/10/17 [id number]

## 2017-01-01 NOTE — ED Triage Notes (Signed)
Patient reports right /lower abdominal pain with emesis onset this week , denies diarrhea or fever , pt. added intermittent vaginal irritation/discomfort.

## 2017-01-02 ENCOUNTER — Emergency Department (HOSPITAL_COMMUNITY): Payer: Self-pay

## 2017-01-02 LAB — WET PREP, GENITAL
Sperm: NONE SEEN
Trich, Wet Prep: NONE SEEN
Yeast Wet Prep HPF POC: NONE SEEN

## 2017-01-02 LAB — GC/CHLAMYDIA PROBE AMP (~~LOC~~) NOT AT ARMC
Chlamydia: POSITIVE — AB
Neisseria Gonorrhea: NEGATIVE

## 2017-01-02 MED ORDER — NAPROXEN 500 MG PO TABS
500.0000 mg | ORAL_TABLET | Freq: Two times a day (BID) | ORAL | 0 refills | Status: DC
Start: 2017-01-02 — End: 2017-05-07

## 2017-01-02 MED ORDER — LIDOCAINE HCL (PF) 1 % IJ SOLN
INTRAMUSCULAR | Status: AC
  Administered 2017-01-02: 5 mL
  Filled 2017-01-02: qty 5

## 2017-01-02 MED ORDER — DOXYCYCLINE HYCLATE 100 MG PO CAPS: 100.0000 mg | ORAL_CAPSULE | Freq: Two times a day (BID) | ORAL | 0 refills | Status: DC

## 2017-01-02 MED ORDER — CEFTRIAXONE SODIUM 250 MG IJ SOLR
250.0000 mg | Freq: Once | INTRAMUSCULAR | Status: AC
  Administered 2017-01-02: 250 mg via INTRAMUSCULAR
  Filled 2017-01-02: qty 250

## 2017-01-02 MED ORDER — PROMETHAZINE HCL 25 MG PO TABS: 25.0000 mg | ORAL_TABLET | Freq: Four times a day (QID) | ORAL | 0 refills | Status: DC | PRN

## 2017-01-02 NOTE — Discharge Instructions (Signed)
You are being treated for pelvic inflammatory disease. Take doxycycline as prescribed until finished. You may take naproxen as needed for pain control. You may use Phenergan for nausea, as needed. Follow up on the results of your STD tests. The test positive for STDs, notify your partners of their need to be tested and treated for STDs as well. You may return to the emergency department, as needed, for new or concerning symptoms.

## 2017-05-06 ENCOUNTER — Encounter (HOSPITAL_COMMUNITY): Payer: Self-pay

## 2017-05-06 ENCOUNTER — Emergency Department (HOSPITAL_COMMUNITY): Payer: Medicaid Other

## 2017-05-06 DIAGNOSIS — R51 Headache: Secondary | ICD-10-CM | POA: Insufficient documentation

## 2017-05-06 DIAGNOSIS — R42 Dizziness and giddiness: Secondary | ICD-10-CM | POA: Insufficient documentation

## 2017-05-06 DIAGNOSIS — F17210 Nicotine dependence, cigarettes, uncomplicated: Secondary | ICD-10-CM | POA: Insufficient documentation

## 2017-05-06 DIAGNOSIS — R002 Palpitations: Secondary | ICD-10-CM | POA: Insufficient documentation

## 2017-05-06 DIAGNOSIS — R1110 Vomiting, unspecified: Secondary | ICD-10-CM | POA: Insufficient documentation

## 2017-05-06 LAB — CBC
HCT: 40.4 % (ref 36.0–46.0)
Hemoglobin: 13.5 g/dL (ref 12.0–15.0)
MCH: 28.8 pg (ref 26.0–34.0)
MCHC: 33.4 g/dL (ref 30.0–36.0)
MCV: 86.1 fL (ref 78.0–100.0)
Platelets: 242 10*3/uL (ref 150–400)
RBC: 4.69 MIL/uL (ref 3.87–5.11)
RDW: 13.5 % (ref 11.5–15.5)
WBC: 9.5 10*3/uL (ref 4.0–10.5)

## 2017-05-06 LAB — COMPREHENSIVE METABOLIC PANEL
ALT: 21 U/L (ref 14–54)
AST: 27 U/L (ref 15–41)
Albumin: 4.1 g/dL (ref 3.5–5.0)
Alkaline Phosphatase: 59 U/L (ref 38–126)
Anion gap: 9 (ref 5–15)
BUN: 9 mg/dL (ref 6–20)
CO2: 24 mmol/L (ref 22–32)
Calcium: 8.9 mg/dL (ref 8.9–10.3)
Chloride: 105 mmol/L (ref 101–111)
Creatinine, Ser: 0.84 mg/dL (ref 0.44–1.00)
GFR calc Af Amer: >60 mL/min (ref >60)
GFR calc non Af Amer: >60 mL/min (ref >60)
Glucose, Bld: 99 mg/dL (ref 65–99)
Potassium: 3.8 mmol/L (ref 3.5–5.1)
Sodium: 138 mmol/L (ref 135–145)
Total Bilirubin: 0.3 mg/dL (ref 0.3–1.2)
Total Protein: 7.2 g/dL (ref 6.5–8.1)

## 2017-05-06 LAB — LIPASE, BLOOD: Lipase: 28 U/L (ref 11–51)

## 2017-05-06 LAB — I-STAT TROPONIN, ED: Troponin i, poc: 0 ng/mL (ref 0.00–0.08)

## 2017-05-06 NOTE — ED Triage Notes (Signed)
Pt here for emesis for 1 week and migraine with dizziness, sts also having palpitations and feels like heart is working overtime with exertion

## 2017-05-07 ENCOUNTER — Emergency Department (HOSPITAL_COMMUNITY)
Admission: EM | Admit: 2017-05-07 | Discharge: 2017-05-07 | Disposition: A | Discharge status: Home / Self Care | Payer: Medicaid Other | Attending: Emergency Medicine | Admitting: Emergency Medicine

## 2017-05-07 DIAGNOSIS — R51 Headache: Secondary | ICD-10-CM

## 2017-05-07 DIAGNOSIS — R519 Headache, unspecified: Secondary | ICD-10-CM

## 2017-05-07 LAB — URINALYSIS, ROUTINE W REFLEX MICROSCOPIC
Bilirubin Urine: NEGATIVE
Glucose, UA: NEGATIVE mg/dL
Ketones, ur: NEGATIVE mg/dL
Leukocytes, UA: NEGATIVE
Nitrite: NEGATIVE
Protein, ur: NEGATIVE mg/dL
Specific Gravity, Urine: 1.02 (ref 1.005–1.030)
pH: 5 (ref 5.0–8.0)

## 2017-05-07 LAB — PREGNANCY, URINE: Preg Test, Ur: NEGATIVE

## 2017-05-07 MED ORDER — KETOROLAC TROMETHAMINE 30 MG/ML IJ SOLN
30.0000 mg | Freq: Once | INTRAMUSCULAR | Status: AC
  Administered 2017-05-07: 30 mg via INTRAVENOUS
  Filled 2017-05-07: qty 1

## 2017-05-07 MED ORDER — METOCLOPRAMIDE HCL 5 MG/ML IJ SOLN
10.0000 mg | Freq: Once | INTRAMUSCULAR | Status: AC
  Administered 2017-05-07: 10 mg via INTRAVENOUS
  Filled 2017-05-07: qty 2

## 2017-05-07 MED ORDER — SODIUM CHLORIDE 0.9 % IV BOLUS (SEPSIS)
1000.0000 mL | Freq: Once | INTRAVENOUS | Status: AC
  Administered 2017-05-07: 1000 mL via INTRAVENOUS

## 2017-05-07 MED ORDER — DIPHENHYDRAMINE HCL 50 MG/ML IJ SOLN
25.0000 mg | Freq: Once | INTRAMUSCULAR | Status: AC
  Administered 2017-05-07: 25 mg via INTRAVENOUS
  Filled 2017-05-07: qty 1

## 2017-05-07 MED ORDER — METOCLOPRAMIDE HCL 10 MG PO TABS: 10.0000 mg | ORAL_TABLET | Freq: Four times a day (QID) | ORAL | 0 refills | Status: DC | PRN

## 2017-05-07 NOTE — ED Notes (Signed)
Dr. Glick at bedside.  

## 2017-05-07 NOTE — ED Provider Notes (Signed)
[facility]PT Provider Note   CSN: [id number] Arrival date & time: 05/06/17  2201  By signing my name below, I, Ny'Kea Lewis, attest that this documentation has been prepared under the direction and in the presence of Dione BoozeGlick, Melody Savidge, MD. Electronically Signed: Karren CobbleNy'Kea Lewis, ED Scribe. 05/07/17. 2:17 AM.  History   Chief Complaint Chief Complaint  Patient presents with  . Migraine  . Dizziness  . Palpitations  . Emesis   The history is provided by the patient. No language interpreter was used.   HPI  HPI Comments: Katelyn Mcfarland is a 26 y.o. female with a PMHx of chronic headaches, depression, and HLD, who presents to the Emergency Department complaining of gradually worsening, persistent acute on chronic migraine for the past week. She notes associated nausea, vomiting, phonophobia, photophobia, chest tightness, palpitations, stabbing epigastric abdominal pain, shortness of breath, lightheadedness, and dizziness. No alleviating factors noted for her migraine. She reports taking Ibuprofen with mild relief of her symptoms. She has been vomiting twice in the mornings and she remains nauseous throughout the day. LNMP was 7/8. She is not currently using preventive measures for pregnancy. Reports taking two at home pregnancy tever, diaphoresis, or visual disturbance. est, with the most recent one two days that she notes was negative. Denies breast swelling, chills, fever, or diaphoresis.   Past Medical History:  Diagnosis Date  . Allergy   . Chlamydia    age 26yo; hx/o trichomonas age 26yo  . Chronic headache   . Depression   . High cholesterol    Patient Active Problem List   Diagnosis Date Noted  . Current smoker 01/11/2014   Past Surgical History:  Procedure Laterality Date  . WISDOM TOOTH EXTRACTION     OB History    No data available     Home Medications    Prior to Admission medications   Medication Sig Start Date End Date Taking? Authorizing Provider  doxycycline  (VIBRAMYCIN) 100 MG capsule Take 1 capsule (100 mg total) by mouth 2 (two) times daily. Take as prescribed until finished 01/02/17   Antony MaduraHumes, Kelly, PA-C  Etonogestrel [facility](IMPLANON [place]) Inject 1 application into the skin continuous.     [provider]  naproxen (NAPROSYN) 500 MG tablet Take 1 tablet (500 mg total) by mouth 2 (two) times daily. 01/02/17   Antony MaduraHumes, Kelly, PA-C  ondansetron (ZOFRAN ODT) 4 MG disintegrating tablet Take 1 tablet (4 mg total) by mouth every 8 (eight) hours as needed for nausea or vomiting. Patient not taking: Reported on 01/02/2017 05/22/15   Emilia BeckSzekalski, Kaitlyn, PA-C  promethazine (PHENERGAN) 25 MG tablet Take 1 tablet (25 mg total) by mouth every 6 (six) hours as needed for nausea or vomiting. 01/02/17   Antony MaduraHumes, Kelly, PA-C   Family History Family History  Problem Relation Age of Onset  . Hypertension Mother   . Diabetes Mother   . Asthma Sister   . Cancer Paternal Grandmother   . Cancer Paternal Grandfather   . Heart disease Neg Hx   . Stroke Neg Hx    Social History Social History  Substance Use Topics  . Smoking status: Current Every Day Smoker    Packs/day: 0.00    Years: 12.00    Types: Cigarettes  . Smokeless tobacco: Never Used     Comment: quit x 3 wk as of 03/2012  . Alcohol use Yes     Comment: Occasionally   Allergies   Patient has no known allergies.  Review of Systems Review of  Systems  Constitutional: Negative for chills, diaphoresis and fever.  Eyes: Positive for photophobia. Negative for visual disturbance.  Respiratory: Positive for chest tightness and shortness of breath.   Cardiovascular: Positive for palpitations.  Gastrointestinal: Positive for abdominal pain and vomiting. Negative for diarrhea.  Genitourinary: Negative for menstrual problem.  Neurological: Positive for dizziness and headaches.  All other systems reviewed and are negative.  Physical Exam Updated Vital Signs BP 132/79 (BP Location: Right Arm)   Pulse 88   Temp  98.2 F (36.8 C) (Oral)   Resp 18   LMP 03/20/2017 (Approximate)   SpO2 100%   Physical Exam  Constitutional: She is oriented to person, place, and time. She appears well-developed and well-nourished.  HENT:  Head: Normocephalic and atraumatic.  Eyes: Pupils are equal, round, and reactive to light. EOM are normal.  Fundi normal.  Neck: Normal range of motion. Neck supple. No JVD present.  Cardiovascular: Normal rate, regular rhythm and normal heart sounds.   No murmur heard. Pulmonary/Chest: Effort normal and breath sounds normal. She has no wheezes. She has no rales. She exhibits no tenderness.  Abdominal: Soft. She exhibits no distension and no mass. There is no tenderness.  Bowel sounds are decreased.   Musculoskeletal: Normal range of motion. She exhibits no edema.  Lymphadenopathy:    She has no cervical adenopathy.  Neurological: She is alert and oriented to person, place, and time. No cranial nerve deficit. She exhibits normal muscle tone. Coordination normal.  Skin: Skin is warm and dry. No rash noted.  Psychiatric: She has a normal mood and affect. Her behavior is normal. Judgment and thought content normal.  Nursing note and vitals reviewed.  ED Treatments / Results  DIAGNOSTIC STUDIES: Oxygen Saturation is 100% on RA, normal by my interpretation.   COORDINATION OF CARE: 1:48 AM-Discussed next steps with pt. Pt verbalized understanding and is agreeable with the plan.   Labs (all labs ordered are listed, but only abnormal results are displayed) Labs Reviewed  URINALYSIS, ROUTINE W REFLEX MICROSCOPIC - Abnormal; Notable for the following:       Result Value   Hgb urine dipstick SMALL (*)    Bacteria, UA RARE (*)    Squamous Epithelial / LPF 0-5 (*)    All other components within normal limits  LIPASE, BLOOD  COMPREHENSIVE METABOLIC PANEL  CBC  PREGNANCY, URINE  I-STAT TROPONIN, ED   EKG  EKG Interpretation  Date/Time:  Wednesday May 06 2017 22:16:57  EDT Ventricular Rate:  87 PR Interval:  124 QRS Duration: 86 QT Interval:  346 QTC Calculation: 416 R Axis:   70 Text Interpretation:  Normal sinus rhythm with sinus arrhythmia T wave abnormality, consider inferior ischemia Abnormal ECG When compared with ECG of 11/10/2007, HEART RATE has decreased Confirmed by Dione BoozeGlick, Elba Dendinger ([id number]) on 05/07/2017 1:43:34 AM      Radiology Dg Chest 2 View  Result Date: 05/06/2017 CLINICAL DATA:  Vomiting for several days EXAM: CHEST  2 VIEW COMPARISON:  11/18/2014 FINDINGS: Cardiac shadow is within normal limits. The lungs are well aerated bilaterally. No acute bony abnormality is noted. IMPRESSION: No active cardiopulmonary disease. Electronically Signed   By: Alcide CleverMark  Lukens M.D.   On: 05/06/2017 22:52   Procedures Procedures (including critical care time)  Medications Ordered in ED Medications  sodium chloride 0.9 % bolus 1,000 mL (0 mLs Intravenous Stopped 05/07/17 0356)  metoCLOPramide (REGLAN) injection 10 mg (10 mg Intravenous Given 05/07/17 0252)  diphenhydrAMINE (BENADRYL) injection 25 mg (25 mg  Intravenous Given 05/07/17 0252)  ketorolac (TORADOL) 30 MG/ML injection 30 mg (30 mg Intravenous Given 05/07/17 0253)     Initial Impression / Assessment and Plan / ED Course  I have reviewed the triage vital signs and the nursing notes.  Pertinent labs & imaging results that were available during my care of the patient were reviewed by me and considered in my medical decision making (see chart for details).  Migraine headache. No red flags to suggest more serious causes of headache. Old records are reviewed, and she has no relevant past visits. She is given a migraine cocktail of normal saline, ketorolac, metoclopramide, diphenhydramine with excellent relief of headache. She is discharged with prescription for metoclopramide.  Final Clinical Impressions(s) / ED Diagnoses   Final diagnoses:  Headache, unspecified headache type    New Prescriptions New  Prescriptions   METOCLOPRAMIDE (REGLAN) 10 MG TABLET    Take 1 tablet (10 mg total) by mouth every 6 (six) hours as needed for nausea (or headache).   I personally performed the services described in this documentation, which was scribed in my presence. The recorded information has been reviewed and is accurate.       Dione Booze, MD 05/07/17 [phone]

## 2017-05-07 NOTE — Discharge Instructions (Signed)
Take acetaminophen, ibuprofen or naproxen as needed for headache.

## 2017-05-07 NOTE — ED Notes (Signed)
Pt wanting to leave due to wait.  Encouraged her to stay and explained triage process.  Pt states she will wait a little bit longer.

## 2017-05-21 ENCOUNTER — Encounter (HOSPITAL_COMMUNITY): Payer: Self-pay | Admitting: Emergency Medicine

## 2017-05-21 ENCOUNTER — Emergency Department (HOSPITAL_COMMUNITY)
Admission: EM | Admit: 2017-05-21 | Discharge: 2017-05-21 | Disposition: A | Discharge status: Home / Self Care | Payer: Medicaid Other | Attending: Emergency Medicine | Admitting: Emergency Medicine

## 2017-05-21 DIAGNOSIS — G43109 Migraine with aura, not intractable, without status migrainosus: Secondary | ICD-10-CM

## 2017-05-21 DIAGNOSIS — F17210 Nicotine dependence, cigarettes, uncomplicated: Secondary | ICD-10-CM | POA: Insufficient documentation

## 2017-05-21 LAB — URINALYSIS, ROUTINE W REFLEX MICROSCOPIC
Bacteria, UA: NONE SEEN
Bilirubin Urine: NEGATIVE
Glucose, UA: NEGATIVE mg/dL
Ketones, ur: NEGATIVE mg/dL
Leukocytes, UA: NEGATIVE
Nitrite: NEGATIVE
Protein, ur: NEGATIVE mg/dL
Specific Gravity, Urine: 1.016 (ref 1.005–1.030)
pH: 6 (ref 5.0–8.0)

## 2017-05-21 LAB — COMPREHENSIVE METABOLIC PANEL
ALT: 16 U/L (ref 14–54)
AST: 23 U/L (ref 15–41)
Albumin: 4.2 g/dL (ref 3.5–5.0)
Alkaline Phosphatase: 52 U/L (ref 38–126)
Anion gap: 6 (ref 5–15)
BUN: 7 mg/dL (ref 6–20)
CO2: 27 mmol/L (ref 22–32)
Calcium: 9.1 mg/dL (ref 8.9–10.3)
Chloride: 103 mmol/L (ref 101–111)
Creatinine, Ser: 0.93 mg/dL (ref 0.44–1.00)
GFR calc Af Amer: >60 mL/min (ref >60)
GFR calc non Af Amer: >60 mL/min (ref >60)
Glucose, Bld: 90 mg/dL (ref 65–99)
Potassium: 3.6 mmol/L (ref 3.5–5.1)
Sodium: 136 mmol/L (ref 135–145)
Total Bilirubin: 0.6 mg/dL (ref 0.3–1.2)
Total Protein: 7.3 g/dL (ref 6.5–8.1)

## 2017-05-21 LAB — CBC
HCT: 39 % (ref 36.0–46.0)
Hemoglobin: 13.3 g/dL (ref 12.0–15.0)
MCH: 29.5 pg (ref 26.0–34.0)
MCHC: 34.1 g/dL (ref 30.0–36.0)
MCV: 86.5 fL (ref 78.0–100.0)
Platelets: 230 10*3/uL (ref 150–400)
RBC: 4.51 MIL/uL (ref 3.87–5.11)
RDW: 13.4 % (ref 11.5–15.5)
WBC: 7.9 10*3/uL (ref 4.0–10.5)

## 2017-05-21 LAB — LIPASE, BLOOD: Lipase: 24 U/L (ref 11–51)

## 2017-05-21 LAB — POC URINE PREG, ED: Preg Test, Ur: NEGATIVE

## 2017-05-21 MED ORDER — SODIUM CHLORIDE 0.9 % IV BOLUS (SEPSIS)
1000.0000 mL | Freq: Once | INTRAVENOUS | Status: AC
  Administered 2017-05-21: 1000 mL via INTRAVENOUS

## 2017-05-21 MED ORDER — ONDANSETRON 4 MG PO TBDP
ORAL_TABLET | ORAL | Status: AC
  Filled 2017-05-21: qty 1

## 2017-05-21 MED ORDER — DIPHENHYDRAMINE HCL 50 MG/ML IJ SOLN
25.0000 mg | Freq: Once | INTRAMUSCULAR | Status: AC
  Administered 2017-05-21: 25 mg via INTRAVENOUS
  Filled 2017-05-21: qty 1

## 2017-05-21 MED ORDER — DEXAMETHASONE SODIUM PHOSPHATE 10 MG/ML IJ SOLN
10.0000 mg | Freq: Once | INTRAMUSCULAR | Status: AC
  Administered 2017-05-21: 10 mg via INTRAVENOUS
  Filled 2017-05-21: qty 1

## 2017-05-21 MED ORDER — METOCLOPRAMIDE HCL 5 MG/ML IJ SOLN
10.0000 mg | Freq: Once | INTRAMUSCULAR | Status: AC
  Administered 2017-05-21: 10 mg via INTRAVENOUS
  Filled 2017-05-21: qty 2

## 2017-05-21 MED ORDER — KETOROLAC TROMETHAMINE 30 MG/ML IJ SOLN
15.0000 mg | Freq: Once | INTRAMUSCULAR | Status: AC
  Administered 2017-05-21: 15 mg via INTRAVENOUS
  Filled 2017-05-21: qty 1

## 2017-05-21 MED ORDER — ONDANSETRON 4 MG PO TBDP
4.0000 mg | ORAL_TABLET | Freq: Once | ORAL | Status: AC | PRN
  Administered 2017-05-21: 4 mg via ORAL

## 2017-05-21 NOTE — Discharge Instructions (Signed)
Please make appointment with either [facility] or [facility] for further evaluation of headaches Return for worsening symptoms

## 2017-05-21 NOTE — ED Provider Notes (Signed)
[facility]-EMERGENCY DEPT Provider Note   CSN: [id number] Arrival date & time: 05/21/17  1524     History   Chief Complaint Chief Complaint  Patient presents with  . Migraine  . Nausea    HPI Katelyn Mcfarland is a 26 y.o. female who presents with a headache. PMH significant for migraines since she was a teenager. She states that she has had a headache on and off daily. It is intermittent and changes sides. Feels like prior headaches but they have been more recurrent and lasting longer. She reports associated aura, N/V. She has never seen a neurologist or had imaging for this. She was seen on 7/26 for the same and d/c with Reglan. She states this helped for a couple days but thinks that her "body got used to it" and the medicine stopped working. She reports a headache every day. Ibuprofen sometimes helps. Denies fever, LOC, trauma, neck stiffness, acute onset, worst headache of life, different from previous HA.   HPI  Past Medical History:  Diagnosis Date  . Allergy   . Chlamydia    age 26yo; hx/o trichomonas age 26yo  . Chronic headache   . Depression   . High cholesterol     Patient Active Problem List   Diagnosis Date Noted  . Current smoker 01/11/2014    Past Surgical History:  Procedure Laterality Date  . WISDOM TOOTH EXTRACTION      OB History    No data available       Home Medications    Prior to Admission medications   Medication Sig Start Date End Date Taking? Authorizing Provider  Etonogestrel (IMPLANON [place]) Inject 1 application into the skin continuous.     [provider]  ibuprofen (ADVIL,MOTRIN) 200 MG tablet Take 200 mg by mouth every 6 (six) hours as needed for headache.    [provider]  metoCLOPramide (REGLAN) 10 MG tablet Take 1 tablet (10 mg total) by mouth every 6 (six) hours as needed for nausea (or headache). 05/07/17   Dione BoozeGlick, David, MD    Family History Family History  Problem Relation Age of Onset  . Hypertension Mother     . Diabetes Mother   . Asthma Sister   . Cancer Paternal Grandmother   . Cancer Paternal Grandfather   . Heart disease Neg Hx   . Stroke Neg Hx     Social History Social History  Substance Use Topics  . Smoking status: Current Every Day Smoker    Packs/day: 0.00    Years: 12.00    Types: Cigarettes  . Smokeless tobacco: Never Used     Comment: quit x 3 wk as of 03/2012  . Alcohol use Yes     Comment: Occasionally     Allergies   Patient has no known allergies.   Review of Systems Review of Systems  Constitutional: Negative for fever.  Eyes: Positive for visual disturbance.  Respiratory: Negative for shortness of breath.   Cardiovascular: Negative for chest pain.  Gastrointestinal: Positive for nausea and vomiting. Negative for abdominal pain.  Musculoskeletal: Positive for neck pain.  Neurological: Positive for headaches. Negative for dizziness, weakness and numbness.     Physical Exam Updated Vital Signs BP (!) 128/92 (BP Location: Right Arm)   Pulse 78   Temp 98.5 F (36.9 C) (Oral)   Resp 16   Ht 4\' 11"  (1.499 m)   Wt 89.8 kg (198 lb)   LMP 05/15/2017 (Approximate)   SpO2 98%  BMI 39.99 kg/m   Physical Exam  Constitutional: She is oriented to person, place, and time. She appears well-developed and well-nourished. No distress.  Lying with blanket over head  HENT:  Head: Normocephalic and atraumatic.  Eyes: Pupils are equal, round, and reactive to light. Conjunctivae are normal. Right eye exhibits no discharge. Left eye exhibits no discharge. No scleral icterus.  Neck: Normal range of motion. Neck supple.  No meningismus   Cardiovascular: Normal rate.   Pulmonary/Chest: Effort normal. No respiratory distress.  Abdominal: She exhibits no distension.  Neurological: She is alert and oriented to person, place, and time.  Lying on stretcher in NAD. GCS 15. Speaks in a clear voice. Cranial nerves II through XII grossly intact. 5/5 strength in all  extremities. Sensation fully intact.  Bilateral finger-to-nose intact. Ambulatory    Skin: Skin is warm and dry.  Psychiatric: She has a normal mood and affect. Her behavior is normal.  Nursing note and vitals reviewed.    ED Treatments / Results  Labs (all labs ordered are listed, but only abnormal results are displayed) Labs Reviewed  URINALYSIS, ROUTINE W REFLEX MICROSCOPIC - Abnormal; Notable for the following:       Result Value   Hgb urine dipstick SMALL (*)    Squamous Epithelial / LPF 0-5 (*)    All other components within normal limits  LIPASE, BLOOD  COMPREHENSIVE METABOLIC PANEL  CBC  POC URINE PREG, ED    EKG  EKG Interpretation None       Radiology No results found.  Procedures Procedures (including critical care time)  Medications Ordered in ED Medications  ondansetron (ZOFRAN-ODT) disintegrating tablet 4 mg (4 mg Oral Given 05/21/17 1600)  sodium chloride 0.9 % bolus 1,000 mL (1,000 mLs Intravenous New Bag/Given 05/21/17 1910)  dexamethasone (DECADRON) injection 10 mg (10 mg Intravenous Given 05/21/17 1907)  metoCLOPramide (REGLAN) injection 10 mg (10 mg Intravenous Given 05/21/17 1857)  ketorolac (TORADOL) 30 MG/ML injection 15 mg (15 mg Intravenous Given 05/21/17 1904)  diphenhydrAMINE (BENADRYL) injection 25 mg (25 mg Intravenous Given 05/21/17 1901)     Initial Impression / Assessment and Plan / ED Course  I have reviewed the triage vital signs and the nursing notes.  Pertinent labs & imaging results that were available during my care of the patient were reviewed by me and considered in my medical decision making (see chart for details).  26 year old female presents with migraine. No red flags in history or on exam. Headaches feel similar to prior headaches. Vitals are normal. Labs are normal. Migraine cocktail was given with good resolution of symptoms. Advised f/u with Neurology due to increased frequency of migraines. Return precautions given.  Final  Clinical Impressions(s) / ED Diagnoses   Final diagnoses:  Migraine with aura and without status migrainosus, not intractable    New Prescriptions New Prescriptions   No medications on file     Beryle Quant 05/21/17 2044    Linwood Dibbles, MD 05/25/17 [phone]

## 2017-05-21 NOTE — ED Triage Notes (Signed)
Pt with headache and nausea on and off for three months, hx of migraines. Pain 6/10. No meds PTA. Denies photo sensitivity. Pt has vomited several times today.

## 2017-06-28 ENCOUNTER — Encounter (HOSPITAL_COMMUNITY): Payer: Self-pay | Admitting: Emergency Medicine

## 2017-06-28 DIAGNOSIS — Z5321 Procedure and treatment not carried out due to patient leaving prior to being seen by health care provider: Secondary | ICD-10-CM | POA: Insufficient documentation

## 2017-06-28 DIAGNOSIS — R109 Unspecified abdominal pain: Secondary | ICD-10-CM | POA: Insufficient documentation

## 2017-06-28 LAB — URINALYSIS, ROUTINE W REFLEX MICROSCOPIC
Bacteria, UA: NONE SEEN
Bilirubin Urine: NEGATIVE
Glucose, UA: NEGATIVE mg/dL
Ketones, ur: NEGATIVE mg/dL
Leukocytes, UA: NEGATIVE
Nitrite: NEGATIVE
Protein, ur: NEGATIVE mg/dL
Specific Gravity, Urine: 1.016 (ref 1.005–1.030)
pH: 5 (ref 5.0–8.0)

## 2017-06-28 LAB — COMPREHENSIVE METABOLIC PANEL
ALT: 15 U/L (ref 14–54)
AST: 21 U/L (ref 15–41)
Albumin: 4.2 g/dL (ref 3.5–5.0)
Alkaline Phosphatase: 56 U/L (ref 38–126)
Anion gap: 7 (ref 5–15)
BUN: 7 mg/dL (ref 6–20)
CO2: 26 mmol/L (ref 22–32)
Calcium: 9.2 mg/dL (ref 8.9–10.3)
Chloride: 104 mmol/L (ref 101–111)
Creatinine, Ser: 0.97 mg/dL (ref 0.44–1.00)
GFR calc Af Amer: >60 mL/min (ref >60)
GFR calc non Af Amer: >60 mL/min (ref >60)
Glucose, Bld: 86 mg/dL (ref 65–99)
Potassium: 3.6 mmol/L (ref 3.5–5.1)
Sodium: 137 mmol/L (ref 135–145)
Total Bilirubin: 0.2 mg/dL — ABNORMAL LOW (ref 0.3–1.2)
Total Protein: 7.4 g/dL (ref 6.5–8.1)

## 2017-06-28 LAB — CBC
HCT: 39.1 % (ref 36.0–46.0)
Hemoglobin: 13.2 g/dL (ref 12.0–15.0)
MCH: 29.3 pg (ref 26.0–34.0)
MCHC: 33.8 g/dL (ref 30.0–36.0)
MCV: 86.9 fL (ref 78.0–100.0)
Platelets: 234 10*3/uL (ref 150–400)
RBC: 4.5 MIL/uL (ref 3.87–5.11)
RDW: 13.7 % (ref 11.5–15.5)
WBC: 11.3 10*3/uL — ABNORMAL HIGH (ref 4.0–10.5)

## 2017-06-28 LAB — LIPASE, BLOOD: Lipase: 29 U/L (ref 11–51)

## 2017-06-28 LAB — HCG, QUANTITATIVE, PREGNANCY: hCG, Beta Chain, Quant, S: 4510 m[IU]/mL — ABNORMAL HIGH (ref <5)

## 2017-06-28 MED ORDER — ONDANSETRON 4 MG PO TBDP: 4.0000 mg | ORAL_TABLET | Freq: Once | ORAL | Status: DC | PRN

## 2017-06-28 MED ORDER — ONDANSETRON 4 MG PO TBDP
ORAL_TABLET | ORAL | Status: AC
  Administered 2017-06-28: 4 mg
  Filled 2017-06-28: qty 1

## 2017-06-28 NOTE — ED Notes (Signed)
Sent pt a TEFL teacher on [employer] and she responded via text and stated she has already been discharged.  Per registration, pt stated earlier that she was feeling better and going home.

## 2017-06-28 NOTE — ED Notes (Signed)
Pt has multiple complaints, states "I'm here for the same thing as before but I lost my papers to follow up with someone" Per chart pt was seen for migraine but denies having a headache right now... Pt is reporting abd pain. Also reports her R breast is draining again.Marland KitchenMarland Kitchen

## 2017-06-29 ENCOUNTER — Emergency Department (HOSPITAL_COMMUNITY)
Admission: EM | Admit: 2017-06-29 | Discharge: 2017-06-29 | Disposition: A | Discharge status: Home / Self Care | Payer: Self-pay | Attending: Emergency Medicine | Admitting: Emergency Medicine

## 2017-07-15 ENCOUNTER — Inpatient Hospital Stay (HOSPITAL_COMMUNITY): Payer: Self-pay

## 2017-07-15 ENCOUNTER — Encounter (HOSPITAL_COMMUNITY): Payer: Self-pay | Admitting: *Deleted

## 2017-07-15 ENCOUNTER — Inpatient Hospital Stay (HOSPITAL_COMMUNITY)
Admission: AD | Admit: 2017-07-15 | Discharge: 2017-07-15 | Disposition: A | Discharge status: Home / Self Care | Payer: Self-pay | Source: Ambulatory Visit | Attending: Obstetrics & Gynecology | Admitting: Obstetrics & Gynecology

## 2017-07-15 DIAGNOSIS — Z79899 Other long term (current) drug therapy: Secondary | ICD-10-CM | POA: Insufficient documentation

## 2017-07-15 DIAGNOSIS — O99341 Other mental disorders complicating pregnancy, first trimester: Secondary | ICD-10-CM | POA: Insufficient documentation

## 2017-07-15 DIAGNOSIS — R109 Unspecified abdominal pain: Secondary | ICD-10-CM | POA: Insufficient documentation

## 2017-07-15 DIAGNOSIS — Z3A08 8 weeks gestation of pregnancy: Secondary | ICD-10-CM | POA: Insufficient documentation

## 2017-07-15 DIAGNOSIS — O26899 Other specified pregnancy related conditions, unspecified trimester: Secondary | ICD-10-CM

## 2017-07-15 DIAGNOSIS — O26891 Other specified pregnancy related conditions, first trimester: Secondary | ICD-10-CM | POA: Insufficient documentation

## 2017-07-15 DIAGNOSIS — E7800 Pure hypercholesterolemia, unspecified: Secondary | ICD-10-CM | POA: Insufficient documentation

## 2017-07-15 DIAGNOSIS — O99281 Endocrine, nutritional and metabolic diseases complicating pregnancy, first trimester: Secondary | ICD-10-CM | POA: Insufficient documentation

## 2017-07-15 DIAGNOSIS — F329 Major depressive disorder, single episode, unspecified: Secondary | ICD-10-CM | POA: Insufficient documentation

## 2017-07-15 DIAGNOSIS — O200 Threatened abortion: Secondary | ICD-10-CM | POA: Insufficient documentation

## 2017-07-15 DIAGNOSIS — O99331 Smoking (tobacco) complicating pregnancy, first trimester: Secondary | ICD-10-CM | POA: Insufficient documentation

## 2017-07-15 HISTORY — DX: Unspecified infectious disease: B99.9

## 2017-07-15 HISTORY — DX: Unspecified abnormal cytological findings in specimens from vagina: R87.629

## 2017-07-15 LAB — URINALYSIS, ROUTINE W REFLEX MICROSCOPIC
Bacteria, UA: NONE SEEN
Bilirubin Urine: NEGATIVE
Glucose, UA: NEGATIVE mg/dL
Ketones, ur: NEGATIVE mg/dL
Leukocytes, UA: NEGATIVE
Nitrite: NEGATIVE
Protein, ur: NEGATIVE mg/dL
Specific Gravity, Urine: 1.012 (ref 1.005–1.030)
pH: 6 (ref 5.0–8.0)

## 2017-07-15 LAB — CBC
HCT: 38.4 % (ref 36.0–46.0)
Hemoglobin: 13 g/dL (ref 12.0–15.0)
MCH: 29.5 pg (ref 26.0–34.0)
MCHC: 33.9 g/dL (ref 30.0–36.0)
MCV: 87.1 fL (ref 78.0–100.0)
Platelets: 220 10*3/uL (ref 150–400)
RBC: 4.41 MIL/uL (ref 3.87–5.11)
RDW: 13.8 % (ref 11.5–15.5)
WBC: 8.1 10*3/uL (ref 4.0–10.5)

## 2017-07-15 LAB — WET PREP, GENITAL
Sperm: NONE SEEN
Trich, Wet Prep: NONE SEEN
Yeast Wet Prep HPF POC: NONE SEEN

## 2017-07-15 LAB — HCG, QUANTITATIVE, PREGNANCY: hCG, Beta Chain, Quant, S: 7154 m[IU]/mL — ABNORMAL HIGH (ref <5)

## 2017-07-15 NOTE — Discharge Instructions (Signed)
Threatened Miscarriage °A threatened miscarriage occurs when you have vaginal bleeding during your first 20 weeks of pregnancy but the pregnancy has not ended. If you have vaginal bleeding during this time, your health care provider will do tests to make sure you are still pregnant. If the tests show you are still pregnant and the developing baby (fetus) inside your womb (uterus) is still growing, your condition is considered a threatened miscarriage. °A threatened miscarriage does not mean your pregnancy will end, but it does increase the risk of losing your pregnancy (complete miscarriage). °What are the causes? °The cause of a threatened miscarriage is usually not known. If you go on to have a complete miscarriage, the most common cause is an abnormal number of chromosomes in the developing baby. Chromosomes are the structures inside cells that hold all your genetic material. °Some causes of vaginal bleeding that do not result in miscarriage include: °· Having sex. °· Having an infection. °· Normal hormone changes of pregnancy. °· Bleeding that occurs when an egg implants in your uterus. ° °What increases the risk? °Risk factors for bleeding in early pregnancy include: °· Obesity. °· Smoking. °· Drinking excessive amounts of alcohol or caffeine. °· Recreational drug use. ° °What are the signs or symptoms? °· Light vaginal bleeding. °· Mild abdominal pain or cramps. °How is this diagnosed? °If you have bleeding with or without abdominal pain before 20 weeks of pregnancy, your health care provider will do tests to check whether you are still pregnant. One important test involves using sound waves and a computer (ultrasound) to create images of the inside of your uterus. Other tests include an internal exam of your vagina and uterus (pelvic exam) and measurement of your baby’s heart rate. °You may be diagnosed with a threatened miscarriage if: °· Ultrasound testing shows you are still pregnant. °· Your baby’s heart  rate is strong. °· A pelvic exam shows that the opening between your uterus and your vagina (cervix) is closed. °· Your heart rate and blood pressure are stable. °· Blood tests confirm you are still pregnant. ° °How is this treated? °No treatments have been shown to prevent a threatened miscarriage from going on to a complete miscarriage. However, the right home care is important. °Follow these instructions at home: °· Make sure you keep all your appointments for prenatal care. This is very important. °· Get plenty of rest. °· Do not have sex or use tampons if you have vaginal bleeding. °· Do not douche. °· Do not smoke or use recreational drugs. °· Do not drink alcohol. °· Avoid caffeine. °Contact a health care provider if: °· You have light vaginal bleeding or spotting while pregnant. °· You have abdominal pain or cramping. °· You have a fever. °Get help right away if: °· You have heavy vaginal bleeding. °· You have blood clots coming from your vagina. °· You have severe low back pain or abdominal cramps. °· You have fever, chills, and severe abdominal pain. °This information is not intended to replace advice given to you by your health care provider. Make sure you discuss any questions you have with your health care provider. °Document Released: 09/29/2005 Document Revised: 03/06/2016 Document Reviewed: 07/26/2013 °Elsevier Interactive Patient Education © 2018 [employer] ° °

## 2017-07-15 NOTE — MAU Provider Note (Signed)
History     CSN: [id number]  Arrival date and time: 07/15/17 4098   First Provider Initiated Contact with Patient 07/15/17 [phone]      Chief Complaint  Patient presents with  . Abdominal Pain   [id number]  here with lower abdominal cramping x2 weeks. The cramping became more painful yesterday and radiates now to left side. She has not taken anything for the pain. Denies VB or vaginal discharge. No urinary sx.    OB History    Gravida Para Term Preterm AB Living   0 2 2   SAB TAB Ectopic Multiple Live Births   1 1 0 0 2      Past Medical History:  Diagnosis Date  . Allergy   . Chlamydia    age 26yo; hx/o trichomonas age 26yo  . Chronic headache   . Depression    doing ok, declined meds  . High cholesterol   . Infection    UTI  . Vaginal Pap smear, abnormal    bx, cryo    Past Surgical History:  Procedure Laterality Date  . INDUCED ABORTION    . WISDOM TOOTH EXTRACTION      Family History  Problem Relation Age of Onset  . Hypertension Mother   . Diabetes Mother   . Asthma Sister   . Cancer Paternal Grandmother   . Cancer Paternal Grandfather   . Cancer Maternal Grandmother   . Kidney disease Maternal Grandmother   . Diabetes Maternal Grandmother   . Heart disease Neg Hx   . Stroke Neg Hx     Social History  Substance Use Topics  . Smoking status: Current Every Day Smoker    Packs/day: 0.25    Years: 16.00    Types: Cigarettes  . Smokeless tobacco: Never Used     Comment: quit x 3 wk as of 03/2012  . Alcohol use Yes     Comment: not currently    Allergies: No Known Allergies  Prescriptions Prior to Admission  Medication Sig Dispense Refill Last Dose  . acetaminophen (TYLENOL) 500 MG tablet Take 500 mg by mouth every 6 (six) hours as needed for mild pain.   Past Week at Unknown time  . metoCLOPramide (REGLAN) 10 MG tablet Take 1 tablet (10 mg total) by mouth every 6 (six) hours as needed for nausea (or headache). (Patient not taking:  Reported on 07/15/2017) 30 tablet 0 Not Taking at Unknown time    Review of Systems  Constitutional: Negative for fever.  Gastrointestinal: Positive for abdominal pain.  Genitourinary: Negative for dysuria, frequency, urgency, vaginal bleeding and vaginal discharge.   Physical Exam   Blood pressure 111/69, pulse 89, temperature 98.1 F (36.7 C), temperature source Oral, resp. rate 18, height 5' 1.5" (1.562 m), weight 202 lb 8 oz (91.9 kg), last menstrual period 05/14/2017, SpO2 100 %.  Physical Exam  Constitutional: She is oriented to person, place, and time. She appears well-developed and well-nourished. No distress.  HENT:  Head: Normocephalic and atraumatic.  Neck: Normal range of motion.  Cardiovascular: Normal rate.   Respiratory: Effort normal.  GI: Soft. She exhibits no distension and no mass. There is no tenderness. There is no rebound and no guarding.  Genitourinary:  Genitourinary Comments: External: no lesions or erythema Vagina: rugated, pink, moist, scant thin white discharge Uterus: non enlarged, anteverted, non tender, no CMT Adnexae: no masses, no tenderness left, no tenderness right   Musculoskeletal: Normal range of motion.  Neurological: She  is alert and oriented to person, place, and time.  Skin: Skin is warm and dry.  Psychiatric: She has a normal mood and affect.   Results for orders placed or performed during the hospital encounter of 07/15/17 (from the past 24 hour(s))  Urinalysis, Routine w reflex microscopic     Status: Abnormal   Collection Time: 07/15/17  7:55 AM  Result Value Ref Range   Color, Urine YELLOW YELLOW   APPearance HAZY (A) CLEAR   Specific Gravity, Urine 1.012 1.005 - 1.030   pH 6.0 5.0 - 8.0   Glucose, UA NEGATIVE NEGATIVE mg/dL   Hgb urine dipstick SMALL (A) NEGATIVE   Bilirubin Urine NEGATIVE NEGATIVE   Ketones, ur NEGATIVE NEGATIVE mg/dL   Protein, ur NEGATIVE NEGATIVE mg/dL   Nitrite NEGATIVE NEGATIVE   Leukocytes, UA  NEGATIVE NEGATIVE   RBC / HPF 0-5 0 - 5 RBC/hpf   WBC, UA 0-5 0 - 5 WBC/hpf   Bacteria, UA NONE SEEN NONE SEEN   Squamous Epithelial / LPF 6-30 (A) NONE SEEN   Mucus PRESENT   Wet prep, genital     Status: Abnormal   Collection Time: 07/15/17  8:35 AM  Result Value Ref Range   Yeast Wet Prep HPF POC NONE SEEN NONE SEEN   Trich, Wet Prep NONE SEEN NONE SEEN   Clue Cells Wet Prep HPF POC PRESENT (A) NONE SEEN   WBC, Wet Prep HPF POC MODERATE (A) NONE SEEN   Sperm NONE SEEN   hCG, quantitative, pregnancy     Status: Abnormal   Collection Time: 07/15/17  8:40 AM  Result Value Ref Range   hCG, Beta Chain, Quant, S 7,154 (H) <5 mIU/mL  CBC     Status: None   Collection Time: 07/15/17  8:41 AM  Result Value Ref Range   WBC 8.1 4.0 - 10.5 K/uL   RBC 4.41 3.87 - 5.11 MIL/uL   Hemoglobin 13.0 12.0 - 15.0 g/dL   HCT 16.1 09.6 - 04.5 %   MCV 87.1 78.0 - 100.0 fL   MCH 29.5 26.0 - 34.0 pg   MCHC 33.9 30.0 - 36.0 g/dL   RDW 40.9 81.1 - 91.4 %   Platelets 220 150 - 400 K/uL   US Ob Comp Less 14 Wks  Result Date: 07/15/2017 CLINICAL DATA:  Abdominal pain and cramping in first trimester pregnancy. EXAM: OBSTETRIC <14 WK [name] AND TRANSVAGINAL OB US TECHNIQUE: Both transabdominal and transvaginal ultrasound examinations were performed for complete evaluation of the gestation as well as the maternal uterus, adnexal regions, and pelvic cul-de-sac. Transvaginal technique was performed to assess early pregnancy. COMPARISON:  None. FINDINGS: Intrauterine gestational sac: Single Yolk sac:  Visualized. Embryo:  Not Visualized. MSD: 10  mm   5 w   5  d Subchorionic hemorrhage:  None visualized. Maternal uterus/adnexae: Normal appearance of both ovaries. No mass or free fluid identified. IMPRESSION: Single intrauterine gestational sac with estimated gestational age of [redacted] weeks 5 days by mean sac diameter. Consider following quantitative beta HCG levels, with followup ultrasound to assess viability in 10-14 days.  Electronically Signed   By: Myles Rosenthal M.D.   On: 07/15/2017 10:22   US Ob Transvaginal  Result Date: 07/15/2017 CLINICAL DATA:  Abdominal pain and cramping in first trimester pregnancy. EXAM: OBSTETRIC <14 WK [name] AND TRANSVAGINAL OB US TECHNIQUE: Both transabdominal and transvaginal ultrasound examinations were performed for complete evaluation of the gestation as well as the maternal uterus, adnexal regions, and pelvic  cul-de-sac. Transvaginal technique was performed to assess early pregnancy. COMPARISON:  None. FINDINGS: Intrauterine gestational sac: Single Yolk sac:  Visualized. Embryo:  Not Visualized. MSD: 10  mm   5 w   5  d Subchorionic hemorrhage:  None visualized. Maternal uterus/adnexae: Normal appearance of both ovaries. No mass or free fluid identified. IMPRESSION: Single intrauterine gestational sac with estimated gestational age of [redacted] weeks 5 days by mean sac diameter. Consider following quantitative beta HCG levels, with followup ultrasound to assess viability in 10-14 days. Electronically Signed   By: Myles Rosenthal M.D.   On: 07/15/2017 10:22   MAU Course  Procedures  MDM Labs and [name] ordered and reviewed. IUGS and YS seen but no embryo which could indicate early pregnancy but I suspect this is a failed pregnancy since quant went from 4k to 7k over 2 weeks and IUGS size is less than LMP dates. Findings discussed with pt. Will order f/u US in 10 days for viability. Stable for discharge home.  Assessment and Plan   1. Threatened miscarriage   2. Abdominal cramping affecting pregnancy    Discharge home Follow up OB US in 10 days SAB/return precautions  Allergies as of 07/15/2017   No Known Allergies     Medication List    STOP taking these medications   metoCLOPramide 10 MG tablet Commonly known as:  REGLAN     TAKE these medications   acetaminophen 500 MG tablet Commonly known as:  TYLENOL Take 500 mg by mouth every 6 (six) hours as needed for mild pain.        Donette Larry, CNM 07/15/2017, 10:45 AM

## 2017-07-15 NOTE — MAU Note (Signed)
Went to appt yesterday, was supposed to have [name] yesterday.  She has been cramping so much they advised her to come here.

## 2017-07-16 LAB — GC/CHLAMYDIA PROBE AMP (~~LOC~~) NOT AT ARMC
Chlamydia: NEGATIVE
Neisseria Gonorrhea: NEGATIVE

## 2017-07-27 ENCOUNTER — Encounter: Payer: Self-pay | Admitting: Obstetrics and Gynecology

## 2017-07-27 ENCOUNTER — Ambulatory Visit (HOSPITAL_COMMUNITY)
Admission: RE | Admit: 2017-07-27 | Discharge: 2017-07-27 | Disposition: A | Discharge status: Home / Self Care | Payer: Self-pay | Source: Ambulatory Visit | Attending: Certified Nurse Midwife | Admitting: Certified Nurse Midwife

## 2017-07-27 ENCOUNTER — Ambulatory Visit (INDEPENDENT_AMBULATORY_CARE_PROVIDER_SITE_OTHER): Payer: Self-pay | Admitting: Obstetrics and Gynecology

## 2017-07-27 DIAGNOSIS — O200 Threatened abortion: Secondary | ICD-10-CM | POA: Insufficient documentation

## 2017-07-27 DIAGNOSIS — O26891 Other specified pregnancy related conditions, first trimester: Secondary | ICD-10-CM | POA: Insufficient documentation

## 2017-07-27 DIAGNOSIS — O021 Missed abortion: Secondary | ICD-10-CM

## 2017-07-27 DIAGNOSIS — Z3A01 Less than 8 weeks gestation of pregnancy: Secondary | ICD-10-CM | POA: Insufficient documentation

## 2017-07-27 DIAGNOSIS — O26899 Other specified pregnancy related conditions, unspecified trimester: Secondary | ICD-10-CM

## 2017-07-27 DIAGNOSIS — R109 Unspecified abdominal pain: Secondary | ICD-10-CM | POA: Insufficient documentation

## 2017-07-27 MED ORDER — MISOPROSTOL 200 MCG PO TABS: ORAL_TABLET | ORAL | 1 refills | Status: DC

## 2017-07-27 NOTE — Progress Notes (Signed)
GYNECOLOGY OFFICE VISIT NOTE  History:  26 y.o. [id number] here today for follow up from MAU visit for cramping/bleeding. She presented with abnormal increase in bHCG with intra-uterine gestational sac noted at that time. Repeat US today shows intra-uterine gestational sac of the same size with no embryo, yolk sac noted. She reports spotting and cramping for the last few weeks, denies other issues. Denies medical issues aside from allergies and migraines.   Past Medical History:  Diagnosis Date  . Allergy   . Chlamydia    age 73yo; hx/o trichomonas age 28yo  . Chronic headache   . Depression    doing ok, declined meds  . High cholesterol   . Infection    UTI  . Vaginal Pap smear, abnormal    bx, cryo    Past Surgical History:  Procedure Laterality Date  . INDUCED ABORTION    . WISDOM TOOTH EXTRACTION      The following portions of the patient's history were reviewed and updated as appropriate: allergies, current medications, past family history, past medical history, past social history, past surgical history and problem list.    Review of Systems:  Pertinent items noted in HPI and remainder of comprehensive ROS otherwise negative.   Objective:  Physical Exam LMP 05/14/2017  CONSTITUTIONAL: Well-developed, well-nourished female in no acute distress.  HENT:  Normocephalic, atraumatic. External right and left ear normal. Oropharynx is clear and moist EYES: Conjunctivae and EOM are normal. Pupils are equal, round, and reactive to light. No scleral icterus.  NECK: Normal range of motion, supple, no masses SKIN: Skin is warm and dry. No rash noted. Not diaphoretic. No erythema. No pallor. NEUROLOGIC: Alert and oriented to person, place, and time. Normal reflexes, muscle tone coordination. No cranial nerve deficit noted. PSYCHIATRIC: Normal mood and affect. Normal behavior. Normal judgment and thought content. RESPIRATORY: Effort normal, no problems with respiration  noted ABDOMEN: Soft, no distention noted.   PELVIC: Deferred MUSCULOSKELETAL: Normal range of motion. No edema noted.  Labs and Imaging US Ob Comp Less 14 Wks  Result Date: 07/15/2017 CLINICAL DATA:  Abdominal pain and cramping in first trimester pregnancy. EXAM: OBSTETRIC <14 WK [name] AND TRANSVAGINAL OB US TECHNIQUE: Both transabdominal and transvaginal ultrasound examinations were performed for complete evaluation of the gestation as well as the maternal uterus, adnexal regions, and pelvic cul-de-sac. Transvaginal technique was performed to assess early pregnancy. COMPARISON:  None. FINDINGS: Intrauterine gestational sac: Single Yolk sac:  Visualized. Embryo:  Not Visualized. MSD: 10  mm   5 w   5  d Subchorionic hemorrhage:  None visualized. Maternal uterus/adnexae: Normal appearance of both ovaries. No mass or free fluid identified. IMPRESSION: Single intrauterine gestational sac with estimated gestational age of [redacted] weeks 5 days by mean sac diameter. Consider following quantitative beta HCG levels, with followup ultrasound to assess viability in 10-14 days. Electronically Signed   By: Myles Rosenthal M.D.   On: 07/15/2017 10:22   US Ob Transvaginal  Result Date: 07/27/2017 CLINICAL DATA:  Vaginal bleeding. Threatened abortion. First trimester pregnancy with inconclusive fetal viability. EXAM: TRANSVAGINAL OB ULTRASOUND TECHNIQUE: Transvaginal ultrasound was performed for complete evaluation of the gestation as well as the maternal uterus, adnexal regions, and pelvic cul-de-sac. COMPARISON:  07/15/2017 FINDINGS: Intrauterine gestational sac: Single Yolk sac:  Visualized. Embryo:  Not Visualized. MSD: 9  mm   5 w   5  d Subchorionic hemorrhage:  None visualized. Maternal uterus/adnexae: Normal appearance of both ovaries. No mass or free fluid  identified. IMPRESSION: Single intrauterine gestational sac shows lack of progression since previous study. Findings meet definitive criteria for failed pregnancy. This  follows SRU consensus guidelines: Diagnostic Criteria for Nonviable Pregnancy Early in the First Trimester. Macy Mis J Med [phone]. Electronically Signed   By: Myles Rosenthal M.D.   On: 07/27/2017 13:21   US Ob Transvaginal  Result Date: 07/15/2017 CLINICAL DATA:  Abdominal pain and cramping in first trimester pregnancy. EXAM: OBSTETRIC <14 WK [name] AND TRANSVAGINAL OB US TECHNIQUE: Both transabdominal and transvaginal ultrasound examinations were performed for complete evaluation of the gestation as well as the maternal uterus, adnexal regions, and pelvic cul-de-sac. Transvaginal technique was performed to assess early pregnancy. COMPARISON:  None. FINDINGS: Intrauterine gestational sac: Single Yolk sac:  Visualized. Embryo:  Not Visualized. MSD: 10  mm   5 w   5  d Subchorionic hemorrhage:  None visualized. Maternal uterus/adnexae: Normal appearance of both ovaries. No mass or free fluid identified. IMPRESSION: Single intrauterine gestational sac with estimated gestational age of [redacted] weeks 5 days by mean sac diameter. Consider following quantitative beta HCG levels, with followup ultrasound to assess viability in 10-14 days. Electronically Signed   By: Myles Rosenthal M.D.   On: 07/15/2017 10:22    Assessment & Plan:  1. Missed abortion Discussed results of abnormal HCG and [name] which are indicative of missed abortion. Reviewed likely etiology of missed abortion and recommendations. Offered expectant, medical and surgical management, discussed risks/benefits of each including heavy bleeding, incomplete abortion, need for subsequent management. Patient and fiancee prefer medical management, verbalize understanding that she may have heavy bleeding and require repeat doses, trip to MAU or D&C. They verbalize understanding of the importance of follow up. Cytotec sent to pharmacy, return 1 week for follow up - ABO/Rh; Future   Routine preventative health maintenance measures emphasized. Please refer to After  Visit Summary for other counseling recommendations.   Return in about 1 week (around 08/03/2017) for Followup.    Baldemar Lenis, M.D. Attending Obstetrician & Gynecologist, [facility] for [employer], [facility]

## 2017-07-27 NOTE — Patient Instructions (Addendum)
Place 4 tablets in the vagina. You may expect to have significant cramping starting a few hours after placement of the medication. You must have an adult with you at all times. Please come to the hospital if you bleed enough to soak more than 1 pad per hour for more than 4 hours. Please call the MAU if you have any questions during non-business hours. If you have not had significant bleeding/cramping in 48 hours, please get your refill and place another dose vaginally.  Miscarriage A miscarriage is the sudden loss of an unborn baby (fetus) before the 20th week of pregnancy. Most miscarriages happen in the first 3 months of pregnancy. Sometimes, it happens before a woman even knows she is pregnant. A miscarriage is also called a "spontaneous miscarriage" or "early pregnancy loss." Having a miscarriage can be an emotional experience. Talk with your caregiver about any questions you may have about miscarrying, the grieving process, and your future pregnancy plans. What are the causes?  Problems with the fetal chromosomes that make it impossible for the baby to develop normally. Problems with the baby's genes or chromosomes are most often the result of errors that occur, by chance, as the embryo divides and grows. The problems are not inherited from the parents.  Infection of the cervix or uterus.  Hormone problems.  Problems with the cervix, such as having an incompetent cervix. This is when the tissue in the cervix is not strong enough to hold the pregnancy.  Problems with the uterus, such as an abnormally shaped uterus, uterine fibroids, or congenital abnormalities.  Certain medical conditions.  Smoking, drinking alcohol, or taking illegal drugs.  Trauma. Often, the cause of a miscarriage is unknown. What are the signs or symptoms?  Vaginal bleeding or spotting, with or without cramps or pain.  Pain or cramping in the abdomen or lower back.  Passing fluid, tissue, or blood clots from the  vagina. How is this diagnosed? Your caregiver will perform a physical exam. You may also have an ultrasound to confirm the miscarriage. Blood or urine tests may also be ordered. How is this treated?  Sometimes, treatment is not necessary if you naturally pass all the fetal tissue that was in the uterus. If some of the fetus or placenta remains in the body (incomplete miscarriage), tissue left behind may become infected and must be removed. Usually, a dilation and curettage (D and C) procedure is performed. During a D and C procedure, the cervix is widened (dilated) and any remaining fetal or placental tissue is gently removed from the uterus.  Antibiotic medicines are prescribed if there is an infection. Other medicines may be given to reduce the size of the uterus (contract) if there is a lot of bleeding.  If you have Rh negative blood and your baby was Rh positive, you will need a Rh immunoglobulin shot. This shot will protect any future baby from having Rh blood problems in future pregnancies. Follow these instructions at home:  Your caregiver may order bed rest or may allow you to continue light activity. Resume activity as directed by your caregiver.  Have someone help with home and family responsibilities during this time.  Keep track of the number of sanitary pads you use each day and how soaked (saturated) they are. Write down this information.  Do not use tampons. Do not douche or have sexual intercourse until approved by your caregiver.  Only take over-the-counter or prescription medicines for pain or discomfort as directed by your caregiver.  Do not take aspirin. Aspirin can cause bleeding.  Keep all follow-up appointments with your caregiver.  If you or your partner have problems with grieving, talk to your caregiver or seek counseling to help cope with the pregnancy loss. Allow enough time to grieve before trying to get pregnant again. Get help right away if:  You have  severe cramps or pain in your back or abdomen.  You have a fever.  You pass large blood clots (walnut-sized or larger) ortissue from your vagina. Save any tissue for your caregiver to inspect.  Your bleeding increases.  You have a thick, bad-smelling vaginal discharge.  You become lightheaded, weak, or you faint.  You have chills. This information is not intended to replace advice given to you by your health care provider. Make sure you discuss any questions you have with your health care provider. Document Released: 03/25/2001 Document Revised: 03/06/2016 Document Reviewed: 11/18/2011 Elsevier Interactive Patient Education  2017 [employer].

## 2017-07-28 LAB — ABO/RH: Rh Factor: POSITIVE

## 2017-08-04 ENCOUNTER — Encounter (HOSPITAL_COMMUNITY): Payer: Self-pay | Admitting: *Deleted

## 2017-08-04 ENCOUNTER — Inpatient Hospital Stay (HOSPITAL_COMMUNITY)
Admission: AD | Admit: 2017-08-04 | Discharge: 2017-08-05 | Disposition: A | Discharge status: Home / Self Care | Payer: Self-pay | Source: Ambulatory Visit | Attending: Family Medicine | Admitting: Family Medicine

## 2017-08-04 DIAGNOSIS — R109 Unspecified abdominal pain: Secondary | ICD-10-CM

## 2017-08-04 DIAGNOSIS — O021 Missed abortion: Secondary | ICD-10-CM | POA: Insufficient documentation

## 2017-08-04 DIAGNOSIS — N760 Acute vaginitis: Secondary | ICD-10-CM

## 2017-08-04 DIAGNOSIS — O039 Complete or unspecified spontaneous abortion without complication: Secondary | ICD-10-CM

## 2017-08-04 DIAGNOSIS — F17210 Nicotine dependence, cigarettes, uncomplicated: Secondary | ICD-10-CM | POA: Insufficient documentation

## 2017-08-04 DIAGNOSIS — G43709 Chronic migraine without aura, not intractable, without status migrainosus: Secondary | ICD-10-CM | POA: Insufficient documentation

## 2017-08-04 DIAGNOSIS — O080 Genital tract and pelvic infection following ectopic and molar pregnancy: Secondary | ICD-10-CM | POA: Insufficient documentation

## 2017-08-04 DIAGNOSIS — G43719 Chronic migraine without aura, intractable, without status migrainosus: Secondary | ICD-10-CM

## 2017-08-04 DIAGNOSIS — B9689 Other specified bacterial agents as the cause of diseases classified elsewhere: Secondary | ICD-10-CM

## 2017-08-04 DIAGNOSIS — Z3A11 11 weeks gestation of pregnancy: Secondary | ICD-10-CM | POA: Insufficient documentation

## 2017-08-04 LAB — WET PREP, GENITAL
Sperm: NONE SEEN
Trich, Wet Prep: NONE SEEN
Yeast Wet Prep HPF POC: NONE SEEN

## 2017-08-04 LAB — CBC
HCT: 37.9 % (ref 36.0–46.0)
Hemoglobin: 12.8 g/dL (ref 12.0–15.0)
MCH: 29.7 pg (ref 26.0–34.0)
MCHC: 33.8 g/dL (ref 30.0–36.0)
MCV: 87.9 fL (ref 78.0–100.0)
Platelets: 264 10*3/uL (ref 150–400)
RBC: 4.31 MIL/uL (ref 3.87–5.11)
RDW: 13.8 % (ref 11.5–15.5)
WBC: 8.5 10*3/uL (ref 4.0–10.5)

## 2017-08-04 LAB — HCG, QUANTITATIVE, PREGNANCY: hCG, Beta Chain, Quant, S: 27 m[IU]/mL — ABNORMAL HIGH (ref <5)

## 2017-08-04 MED ORDER — METOCLOPRAMIDE HCL 5 MG/ML IJ SOLN
10.0000 mg | Freq: Once | INTRAMUSCULAR | Status: AC
  Administered 2017-08-04: 10 mg via INTRAMUSCULAR
  Filled 2017-08-04: qty 2

## 2017-08-04 MED ORDER — DIPHENHYDRAMINE HCL 50 MG/ML IJ SOLN
25.0000 mg | Freq: Once | INTRAMUSCULAR | Status: AC
  Administered 2017-08-04: 25 mg via INTRAMUSCULAR
  Filled 2017-08-04: qty 1

## 2017-08-04 MED ORDER — METRONIDAZOLE 500 MG PO TABS: 500.0000 mg | ORAL_TABLET | Freq: Two times a day (BID) | ORAL | 0 refills | Status: AC

## 2017-08-04 MED ORDER — DEXAMETHASONE SODIUM PHOSPHATE 10 MG/ML IJ SOLN
10.0000 mg | Freq: Once | INTRAMUSCULAR | Status: AC
  Administered 2017-08-04: 10 mg via INTRAMUSCULAR
  Filled 2017-08-04: qty 1

## 2017-08-04 NOTE — MAU Note (Signed)
Patient presents to MAU with c/o abdominal pain and dizziness. States she has finished cytotec for miscarriage last Tuesday. Some spotting present and worse pain than before.

## 2017-08-04 NOTE — MAU Provider Note (Signed)
Chief Complaint: Abdominal Pain and Dizziness   SUBJECTIVE HPI: Katelyn Mcfarland is a 26 y.o. [id number] at [redacted]w[redacted]d who presents to MAU reporting symptoms of dizziness and abdominal cramping.  Patient states that about a week ago she was given Cytotec for miscarriage.  States that since then she has passed clots and had heavy bleeding.  She has had continued abdominal cramping and pain.  Bleeding has slowed down to spotting.  She still however has the cramping and pressure.  She is wondering if she passed all her products of conception.  Also concerned that abdominal cramping could be due to infection since this has happened before when she had an infection.  She denies any worrisome vaginal discharge, odor.  She denies any fevers.  She states that she has been feeling dizzy and lightheaded.  Has a history of migraines and believes this could be due to her migraine.  Currently rating her headache as 7 out of 10.  Has had ibuprofen and Tylenol without relief.  States that she goes to sleep and wakes up with continued headache.  States that she has had poor p.o. intake due to headache and only wanting to sleep.   Past Medical History:  Diagnosis Date  . Allergy   . Chlamydia    age 4yo; hx/o trichomonas age 63yo  . Chronic headache   . Depression    doing ok, declined meds  . High cholesterol   . Infection    UTI  . Vaginal Pap smear, abnormal    bx, cryo   OB History  Gravida Para Term Preterm AB Living  5 2 2  0 2 2  SAB TAB Ectopic Multiple Live Births  1 1 0 0 2    # Outcome Date GA Lbr Len/2nd Weight Sex Delivery Anes PTL Lv  5 Gravida           4 TAB           3 SAB           2 Term     F Vag-Spont  N LIV  1 Term     F Vag-Spont  N LIV     Past Surgical History:  Procedure Laterality Date  . INDUCED ABORTION    . WISDOM TOOTH EXTRACTION     Social History   Social History  . Marital status: Single    Spouse name: N/A  . Number of children: N/A  . Years of education: N/A    Occupational History  . student [employer]   Social History Main Topics  . Smoking status: Current Every Day Smoker    Packs/day: 0.25    Years: 16.00    Types: Cigarettes  . Smokeless tobacco: Never Used     Comment: quit x 3 wk as of 03/2012  . Alcohol use Yes     Comment: not currently  . Drug use: No     Comment: last Mar 2018  . Sexual activity: Yes   Other Topics Concern  . Not on file   Social History Narrative   Single, 2 children, exercise - none.  Work - dietary aid, [employer]   No current facility-administered medications on file prior to encounter.    Current Outpatient Prescriptions on File Prior to Encounter  Medication Sig Dispense Refill  . acetaminophen (TYLENOL) 500 MG tablet Take 500 mg by mouth every 6 (six) hours as needed for mild pain.    Marland Kitchen  misoprostol (CYTOTEC) 200 MCG tablet Place insert four tablets vaginally 4 tablet 1   No Known Allergies  I have reviewed the past Medical Hx, Surgical Hx, Social Hx, Allergies and Medications.   REVIEW OF SYSTEMS All systems reviewed and are negative for acute change except as noted in the HPI.   OBJECTIVE BP 131/81   Pulse 90   Temp 98.5 F (36.9 C) (Oral)   Resp 16   Ht 5' 1.5" (1.562 m)   Wt 93.3 kg (205 lb 12 oz)   LMP 05/14/2017   SpO2 100%   Breastfeeding? Unknown   BMI 38.25 kg/m    PHYSICAL EXAM Constitutional: Well-developed, well-nourished female in no acute distress.  Head: Normocephalic and atraumatic Eyes: EOMI, PERRLA, no scleral icterus or injection Cardiovascular: normal rate and rhythm, pulses intact Respiratory: normal rate and effort.  GI: Abd soft, non-tender, non-distended. No guarding.  MS: Extremities nontender, no edema, normal ROM Neurologic: Alert and oriented x 4. No focal deficits.  SPECULUM EXAM: NEFG, physiologic discharge, minimal old blood noted, cervix clean and closed BIMANUAL: uterus normal size, no adnexal tenderness  or masses. No CMT. Psych: normal mood and affect  LAB RESULTS Results for orders placed or performed during the hospital encounter of 08/04/17 (from the past 24 hour(s))  hCG, quantitative, pregnancy     Status: Abnormal   Collection Time: 08/04/17 10:47 PM  Result Value Ref Range   hCG, Beta Chain, Quant, S 27 (H) <5 mIU/mL  CBC     Status: None   Collection Time: 08/04/17 10:47 PM  Result Value Ref Range   WBC 8.5 4.0 - 10.5 K/uL   RBC 4.31 3.87 - 5.11 MIL/uL   Hemoglobin 12.8 12.0 - 15.0 g/dL   HCT 09.8 11.9 - 14.7 %   MCV 87.9 78.0 - 100.0 fL   MCH 29.7 26.0 - 34.0 pg   MCHC 33.8 30.0 - 36.0 g/dL   RDW 82.9 56.2 - 13.0 %   Platelets 264 150 - 400 K/uL  Wet prep, genital     Status: Abnormal   Collection Time: 08/04/17 11:00 PM  Result Value Ref Range   Yeast Wet Prep HPF POC NONE SEEN NONE SEEN   Trich, Wet Prep NONE SEEN NONE SEEN   Clue Cells Wet Prep HPF POC PRESENT (A) NONE SEEN   WBC, Wet Prep HPF POC FEW (A) NONE SEEN   Sperm NONE SEEN     IMAGING US Ob Comp Less 14 Wks  Result Date: 07/15/2017 CLINICAL DATA:  Abdominal pain and cramping in first trimester pregnancy. EXAM: OBSTETRIC <14 WK [name] AND TRANSVAGINAL OB US TECHNIQUE: Both transabdominal and transvaginal ultrasound examinations were performed for complete evaluation of the gestation as well as the maternal uterus, adnexal regions, and pelvic cul-de-sac. Transvaginal technique was performed to assess early pregnancy. COMPARISON:  None. FINDINGS: Intrauterine gestational sac: Single Yolk sac:  Visualized. Embryo:  Not Visualized. MSD: 10  mm   5 w   5  d Subchorionic hemorrhage:  None visualized. Maternal uterus/adnexae: Normal appearance of both ovaries. No mass or free fluid identified. IMPRESSION: Single intrauterine gestational sac with estimated gestational age of [redacted] weeks 5 days by mean sac diameter. Consider following quantitative beta HCG levels, with followup ultrasound to assess viability in 10-14 days.  Electronically Signed   By: Myles Rosenthal M.D.   On: 07/15/2017 10:22   US Ob Transvaginal  Result Date: 07/27/2017 CLINICAL DATA:  Vaginal bleeding. Threatened abortion. First trimester pregnancy with  inconclusive fetal viability. EXAM: TRANSVAGINAL OB ULTRASOUND TECHNIQUE: Transvaginal ultrasound was performed for complete evaluation of the gestation as well as the maternal uterus, adnexal regions, and pelvic cul-de-sac. COMPARISON:  07/15/2017 FINDINGS: Intrauterine gestational sac: Single Yolk sac:  Visualized. Embryo:  Not Visualized. MSD: 9  mm   5 w   5  d Subchorionic hemorrhage:  None visualized. Maternal uterus/adnexae: Normal appearance of both ovaries. No mass or free fluid identified. IMPRESSION: Single intrauterine gestational sac shows lack of progression since previous study. Findings meet definitive criteria for failed pregnancy. This follows SRU consensus guidelines: Diagnostic Criteria for Nonviable Pregnancy Early in the First Trimester. Macy Mis Engl J Med [phone]. Electronically Signed   By: Myles RosenthalJohn  Stahl M.D.   On: 07/27/2017 13:21   [place] Ob Transvaginal  Result Date: 07/15/2017 CLINICAL DATA:  Abdominal pain and cramping in first trimester pregnancy. EXAM: OBSTETRIC <14 WK US AND TRANSVAGINAL OB US TECHNIQUE: Both transabdominal and transvaginal ultrasound examinations were performed for complete evaluation of the gestation as well as the maternal uterus, adnexal regions, and pelvic cul-de-sac. Transvaginal technique was performed to assess early pregnancy. COMPARISON:  None. FINDINGS: Intrauterine gestational sac: Single Yolk sac:  Visualized. Embryo:  Not Visualized. MSD: 10  mm   5 w   5  d Subchorionic hemorrhage:  None visualized. Maternal uterus/adnexae: Normal appearance of both ovaries. No mass or free fluid identified. IMPRESSION: Single intrauterine gestational sac with estimated gestational age of [redacted] weeks 5 days by mean sac diameter. Consider following quantitative beta HCG  levels, with followup ultrasound to assess viability in 10-14 days. Electronically Signed   By: Myles RosenthalJohn  Stahl M.D.   On: 07/15/2017 10:22    MAU COURSE Vitals and nursing notes reviewed I have ordered labs and reviewed them Hcg appropriately trending down No leukocytosis Wet prep with BV Treatments given in MAU: Migraine cocktail  MDM Plan of care reviewed with patient, including labs and tests ordered and medical treatment.   ASSESSMENT 1. Intractable chronic migraine without aura and without status migrainosus   2. Missed abortion   3. Abdominal cramping   4. Bacterial vaginosis   5. Abortion     PLAN Discharge home in stable condition Counseled on return precautions Encouraged her to follow-up with OB as planned this week Rx for flagyl given Needs to follow-up with neurology for continued migraines  Handout given   Caryl AdaJazma Phelps, DO OB Fellow Faculty Practice, [facility] 08/04/2017, 11:43 PM

## 2017-08-04 NOTE — MAU Note (Deleted)
Feeling shooting pain in spine. Incisional pain and and numbness in lower abdomen.   States legs "went numb and got swollen from toes to knees last night and today.    Called doctor and was told to come in.   

## 2017-09-30 ENCOUNTER — Emergency Department (HOSPITAL_COMMUNITY)
Admission: EM | Admit: 2017-09-30 | Discharge: 2017-10-01 | Disposition: A | Discharge status: Home / Self Care | Payer: Self-pay | Attending: Emergency Medicine | Admitting: Emergency Medicine

## 2017-09-30 ENCOUNTER — Other Ambulatory Visit: Payer: Self-pay

## 2017-09-30 ENCOUNTER — Encounter (HOSPITAL_COMMUNITY): Payer: Self-pay | Admitting: *Deleted

## 2017-09-30 DIAGNOSIS — Z5321 Procedure and treatment not carried out due to patient leaving prior to being seen by health care provider: Secondary | ICD-10-CM | POA: Insufficient documentation

## 2017-09-30 LAB — COMPREHENSIVE METABOLIC PANEL
ALT: 16 U/L (ref 14–54)
AST: 24 U/L (ref 15–41)
Albumin: 4.3 g/dL (ref 3.5–5.0)
Alkaline Phosphatase: 60 U/L (ref 38–126)
Anion gap: 7 (ref 5–15)
BUN: 8 mg/dL (ref 6–20)
CO2: 24 mmol/L (ref 22–32)
Calcium: 9 mg/dL (ref 8.9–10.3)
Chloride: 107 mmol/L (ref 101–111)
Creatinine, Ser: 0.95 mg/dL (ref 0.44–1.00)
GFR calc Af Amer: >60 mL/min (ref >60)
GFR calc non Af Amer: >60 mL/min (ref >60)
Glucose, Bld: 91 mg/dL (ref 65–99)
Potassium: 3.9 mmol/L (ref 3.5–5.1)
Sodium: 138 mmol/L (ref 135–145)
Total Bilirubin: 0.4 mg/dL (ref 0.3–1.2)
Total Protein: 7.5 g/dL (ref 6.5–8.1)

## 2017-09-30 LAB — URINALYSIS, ROUTINE W REFLEX MICROSCOPIC
Bacteria, UA: NONE SEEN
Bilirubin Urine: NEGATIVE
Glucose, UA: NEGATIVE mg/dL
Ketones, ur: NEGATIVE mg/dL
Leukocytes, UA: NEGATIVE
Nitrite: NEGATIVE
Protein, ur: NEGATIVE mg/dL
Specific Gravity, Urine: 1.016 (ref 1.005–1.030)
pH: 5 (ref 5.0–8.0)

## 2017-09-30 LAB — CBC
HCT: 39.4 % (ref 36.0–46.0)
Hemoglobin: 13.3 g/dL (ref 12.0–15.0)
MCH: 29.7 pg (ref 26.0–34.0)
MCHC: 33.8 g/dL (ref 30.0–36.0)
MCV: 87.9 fL (ref 78.0–100.0)
Platelets: 230 10*3/uL (ref 150–400)
RBC: 4.48 MIL/uL (ref 3.87–5.11)
RDW: 13.9 % (ref 11.5–15.5)
WBC: 7.8 10*3/uL (ref 4.0–10.5)

## 2017-09-30 LAB — I-STAT BETA HCG BLOOD, ED (MC, WL, AP ONLY): I-stat hCG, quantitative: <5 m[IU]/mL (ref <5)

## 2017-09-30 LAB — LIPASE, BLOOD: Lipase: 23 U/L (ref 11–51)

## 2017-09-30 NOTE — ED Triage Notes (Signed)
The pt is c/o abd pain for 2 weeks  Nausea only    No vaginal discharge  lmp  saturday

## 2017-09-30 NOTE — ED Notes (Signed)
Patient asked to be removed from the waiting room, did not want to wait any longer.

## 2018-04-16 ENCOUNTER — Telehealth: Payer: Self-pay

## 2018-04-16 ENCOUNTER — Other Ambulatory Visit: Payer: Self-pay | Admitting: Obstetrics

## 2018-04-16 DIAGNOSIS — O219 Vomiting of pregnancy, unspecified: Secondary | ICD-10-CM

## 2018-04-16 MED ORDER — PROMETHAZINE HCL 25 MG PO TABS: 25.0000 mg | ORAL_TABLET | Freq: Three times a day (TID) | ORAL | 2 refills | Status: DC | PRN

## 2018-04-16 NOTE — Telephone Encounter (Signed)
Pt called requesting a rx for nausea. Pt has not been seen for her New OB yet. This is scheduled for 7/16. Please advise for Rx

## 2018-04-16 NOTE — Telephone Encounter (Signed)
Left detailed message informing pt on vm 

## 2018-04-16 NOTE — Telephone Encounter (Signed)
Phenergan Rx. Discuss dietary changes for N/V in pregnancy with patient.

## 2018-04-25 ENCOUNTER — Inpatient Hospital Stay (HOSPITAL_COMMUNITY)
Admission: AD | Admit: 2018-04-25 | Discharge: 2018-04-25 | Disposition: A | Discharge status: Home / Self Care | Payer: Medicaid Other | Source: Ambulatory Visit | Attending: Obstetrics & Gynecology | Admitting: Obstetrics & Gynecology

## 2018-04-25 ENCOUNTER — Encounter (HOSPITAL_COMMUNITY): Payer: Self-pay

## 2018-04-25 ENCOUNTER — Other Ambulatory Visit: Payer: Self-pay

## 2018-04-25 DIAGNOSIS — R1084 Generalized abdominal pain: Secondary | ICD-10-CM | POA: Diagnosis present

## 2018-04-25 DIAGNOSIS — O26891 Other specified pregnancy related conditions, first trimester: Secondary | ICD-10-CM | POA: Insufficient documentation

## 2018-04-25 DIAGNOSIS — E7800 Pure hypercholesterolemia, unspecified: Secondary | ICD-10-CM | POA: Diagnosis not present

## 2018-04-25 DIAGNOSIS — Z79899 Other long term (current) drug therapy: Secondary | ICD-10-CM | POA: Diagnosis not present

## 2018-04-25 DIAGNOSIS — Z9889 Other specified postprocedural states: Secondary | ICD-10-CM | POA: Diagnosis not present

## 2018-04-25 DIAGNOSIS — Z7982 Long term (current) use of aspirin: Secondary | ICD-10-CM | POA: Diagnosis not present

## 2018-04-25 DIAGNOSIS — R040 Epistaxis: Secondary | ICD-10-CM | POA: Insufficient documentation

## 2018-04-25 DIAGNOSIS — O99281 Endocrine, nutritional and metabolic diseases complicating pregnancy, first trimester: Secondary | ICD-10-CM | POA: Insufficient documentation

## 2018-04-25 DIAGNOSIS — O219 Vomiting of pregnancy, unspecified: Secondary | ICD-10-CM | POA: Diagnosis not present

## 2018-04-25 DIAGNOSIS — Z3A11 11 weeks gestation of pregnancy: Secondary | ICD-10-CM | POA: Insufficient documentation

## 2018-04-25 DIAGNOSIS — R109 Unspecified abdominal pain: Secondary | ICD-10-CM

## 2018-04-25 LAB — URINALYSIS, ROUTINE W REFLEX MICROSCOPIC
Bilirubin Urine: NEGATIVE
Glucose, UA: NEGATIVE mg/dL
Ketones, ur: 20 mg/dL — AB
Leukocytes, UA: NEGATIVE
Nitrite: NEGATIVE
Protein, ur: NEGATIVE mg/dL
Specific Gravity, Urine: 1.015 (ref 1.005–1.030)
pH: 6 (ref 5.0–8.0)

## 2018-04-25 LAB — POCT PREGNANCY, URINE: Preg Test, Ur: POSITIVE — AB

## 2018-04-25 MED ORDER — ONDANSETRON 8 MG PO TBDP: 8.0000 mg | ORAL_TABLET | Freq: Three times a day (TID) | ORAL | 2 refills | Status: DC | PRN

## 2018-04-25 MED ORDER — ONDANSETRON 8 MG PO TBDP
8.0000 mg | ORAL_TABLET | Freq: Once | ORAL | Status: AC
  Administered 2018-04-25: 8 mg via ORAL
  Filled 2018-04-25: qty 1

## 2018-04-25 MED ORDER — DOXYLAMINE-PYRIDOXINE 10-10 MG PO TBEC: 1.0000 | DELAYED_RELEASE_TABLET | ORAL | 3 refills | Status: DC

## 2018-04-25 NOTE — Discharge Instructions (Signed)
Morning Sickness °Morning sickness is when you feel sick to your stomach (nauseous) during pregnancy. This nauseous feeling may or may not come with vomiting. It often occurs in the morning but can be a problem any time of day. Morning sickness is most common during the first trimester, but it may continue throughout pregnancy. While morning sickness is unpleasant, it is usually harmless unless you develop severe and continual vomiting (hyperemesis gravidarum). This condition requires more intense treatment. °What are the causes? °The cause of morning sickness is not completely known but seems to be related to normal hormonal changes that occur in pregnancy. °What increases the risk? °You are at greater risk if you: °· Experienced nausea or vomiting before your pregnancy. °· Had morning sickness during a previous pregnancy. °· Are pregnant with more than one baby, such as twins. ° °How is this treated? °Do not use any medicines (prescription, over-the-counter, or herbal) for morning sickness without first talking to your health care provider. Your health care provider may prescribe or recommend: °· Vitamin B6 supplements. °· Anti-nausea medicines. °· The herbal medicine ginger. ° °Follow these instructions at home: °· Only take over-the-counter or prescription medicines as directed by your health care provider. °· Taking multivitamins before getting pregnant can prevent or decrease the severity of morning sickness in most women. °· Eat a piece of dry toast or unsalted crackers before getting out of bed in the morning. °· Eat five or six small meals a day. °· Eat dry and bland foods (rice, baked potato). Foods high in carbohydrates are often helpful. °· Do not drink liquids with your meals. Drink liquids between meals. °· Avoid greasy, fatty, and spicy foods. °· Get someone to cook for you if the smell of any food causes nausea and vomiting. °· If you feel nauseous after taking prenatal vitamins, take the vitamins at  night or with a snack. °· Snack on protein foods (nuts, yogurt, cheese) between meals if you are hungry. °· Eat unsweetened gelatins for desserts. °· Wearing an acupressure wristband (worn for sea sickness) may be helpful. °· Acupuncture may be helpful. °· Do not smoke. °· Get a humidifier to keep the air in your house free of odors. °· Get plenty of fresh air. °Contact a health care provider if: °· Your home remedies are not working, and you need medicine. °· You feel dizzy or lightheaded. °· You are losing weight. °Get help right away if: °· You have persistent and uncontrolled nausea and vomiting. °· You pass out (faint). °This information is not intended to replace advice given to you by your health care provider. Make sure you discuss any questions you have with your health care provider. °Document Released: 11/20/2006 Document Revised: 03/06/2016 Document Reviewed: 03/16/2013 °Elsevier Interactive Patient Education © 2017 [employer] ° °

## 2018-04-25 NOTE — MAU Note (Addendum)
G5P2 @ early pregnant. HPT + and went to pregnancy center and had 2 u/s done due to pt presenting there with abd cramps July 3rd.  Marland Kitchen.   Presents to triage today for abdominal cramping that started at 0100 while at work. 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 Did not take any thing for abdominal pain.Marland Kitchen. Also c/o N/V. Promethazine for nausea prescribed by Dr. Verdell CarmineHarper's office for the pregnancy related nausea. Denies bleeding.   Has 1st OB appt with Dr. Clearance CootsHarper scheduled for July 16 made by the office.  LMP February 07, 2018. Pt was given EDD: Nov 11, 2018  Hx of PIH during labor with 1st pregnancy and was on MgSO4 infusion during labor.. Infant in NICU for 1-[redacted] wksga dt lethargy. 2nd pregnancy, infant with fractured collarbone.   Doppler 169-172  BP 111/62   Pulse 90   Temp 98.8 F (37.1 C) (Oral)   Resp 18   Ht 5' (1.524 m)   Wt 200 lb (90.7 kg)   LMP 09/30/2017   BMI 39.06 kg/m   Relinquished care over to [employer] Christina.   1245: resumed care. Received d/c orders.   1255 D/c instructions given with pt understanding. Pt left unit with SO via ambulatory.

## 2018-04-25 NOTE — MAU Provider Note (Signed)
Chief Complaint: Abdominal Pain and Emesis   First Provider Initiated Contact with Patient 04/25/18 1055     SUBJECTIVE HPI: Katelyn Mcfarland is a 27 y.o. N5A2130 at [redacted]w[redacted]d who presents to Maternity Admissions reporting increased N/V since this morning-4 x. Has been vomiting once a day. Has phenergan Rx, but it causes her Migraines to flair up so she stopped taking it last week. Also reports generalized abd pain last night that has mostly resolved on it's own. FOB is concerned that she may have been exposed to some infectious agent at a swimming pool.    Also reports having had a nose bleed and then seeing small streaks of blood in emesis afterwards.  [name] [facility], S=D.   Location: generalized sbd, upper and lower more that mid Quality: cramping Severity: None now. Moderate at 1:00 am Duration: <12 hours Context: Pregnancy. Live IUP previously conformed. Timing: intermittent Modifying factors: Resolved spontaneously  Associated signs and symptoms: Pos for N/V. Neg for fever, chills, BV, vaginal discharge, LOF.   Past Medical History:  Diagnosis Date  . Allergy   . Chlamydia    age 67yo; hx/o trichomonas age 48yo  . Chronic headache   . Depression    doing ok, declined meds  . High cholesterol   . Infection    UTI  . Vaginal Pap smear, abnormal    bx, cryo   OB History  Gravida Para Term Preterm AB Living  6 2 2  0 2 2  SAB TAB Ectopic Multiple Live Births  1 1 0 0 2    # Outcome Date GA Lbr Len/2nd Weight Sex Delivery Anes PTL Lv  6 Current           5 TAB           4 SAB           3 Term     F Vag-Spont  N LIV  2 Term     F Vag-Spont  N LIV  1 Gravida            Past Surgical History:  Procedure Laterality Date  . INDUCED ABORTION    . WISDOM TOOTH EXTRACTION     Social History   Socioeconomic History  . Marital status: Single    Spouse name: Not on file  . Number of children: Not on file  . Years of education: Not on file  . Highest  education level: Not on file  Occupational History  . Occupation: student [employer]     Employer: [employer]  Social Needs  . Financial resource strain: Not on file  . Food insecurity:    Worry: Not on file    Inability: Not on file  . Transportation needs:    Medical: Not on file    Non-medical: Not on file  Tobacco Use  . Smoking status: Current Every Day Smoker    Packs/day: 0.25    Years: 16.00    Pack years: 4.00    Types: Cigarettes  . Smokeless tobacco: Never Used  . Tobacco comment: quit x 3 wk as of 03/2012  Substance and Sexual Activity  . Alcohol use: Yes    Comment: not currently  . Drug use: No    Types: Marijuana    Comment: last Mar 2018  . Sexual activity: Yes  Lifestyle  . Physical activity:    Days per week: Not on file    Minutes per session: Not on file  .  Stress: Not on file  Relationships  . Social connections:    Talks on phone: Not on file    Gets together: Not on file    Attends religious service: Not on file    Active member of club or organization: Not on file    Attends meetings of clubs or organizations: Not on file    Relationship status: Not on file  . Intimate partner violence:    Fear of current or ex partner: Not on file    Emotionally abused: Not on file    Physically abused: Not on file    Forced sexual activity: Not on file  Other Topics Concern  . Not on file  Social History Narrative   Single, 2 children, exercise - none.  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 Hypertension Mother   . Diabetes Mother   . Asthma Sister   . Cancer Paternal Grandmother   . Cancer Paternal Grandfather   . Cancer Maternal Grandmother   . Kidney disease Maternal Grandmother   . Diabetes Maternal Grandmother   . Heart disease Neg Hx   . Stroke Neg Hx    No current facility-administered medications on file prior to encounter.    Current Outpatient Medications on File  Prior to Encounter  Medication Sig Dispense Refill  . acetaminophen (TYLENOL) 500 MG tablet Take 500 mg by mouth every 6 (six) hours as needed for mild pain.    . Ascorbic Acid (VITAMIN C PO) Take 1 tablet by mouth daily.    Marland Kitchen aspirin-acetaminophen-caffeine (EXCEDRIN MIGRAINE) 250-250-65 MG tablet Take 1 tablet by mouth every 6 (six) hours as needed for headache.    . Omega-3 Fatty Acids (FISH OIL PO) Take 1 capsule by mouth daily.    Marland Kitchen OVER THE COUNTER MEDICATION Take 1 capsule by mouth 2 (two) times daily. KETO POWER    . OVER THE COUNTER MEDICATION Take 1 capsule by mouth daily. BODY CLEANSE EXTRACT    . promethazine (PHENERGAN) 25 MG tablet Take 1 tablet (25 mg total) by mouth every 8 (eight) hours as needed for refractory nausea / vomiting. 30 tablet 2  . VITAMIN E PO Take 1 capsule by mouth daily.     No Known Allergies  I have reviewed patient's Past Medical Hx, Surgical Hx, Family Hx, Social Hx, medications and allergies.   Review of Systems  Constitutional: Negative for appetite change, chills and fever.  Gastrointestinal: Positive for abdominal pain, nausea and vomiting. Negative for abdominal distention, blood in stool, constipation and diarrhea.  Genitourinary: Negative for difficulty urinating, dysuria, flank pain, frequency, hematuria, pelvic pain, urgency, vaginal bleeding and vaginal discharge.  Musculoskeletal: Negative for back pain.  Neurological: Negative for light-headedness.    OBJECTIVE Patient Vitals for the past 24 hrs:  BP Temp Temp src Pulse Resp Height Weight  04/25/18 1013 111/62 98.8 F (37.1 C) Oral 90 18 5' (1.524 m) 200 lb (90.7 kg)   Constitutional: Well-developed, well-nourished female in no acute distress.  Head: Mucus membranes moist Cardiovascular: normal rate Respiratory: normal rate and effort.  GI: Abd soft, non-tender, gravid appropriate for gestational age. Pos BS x 4 MS: Extremities nontender, no edema, normal ROM Neurologic: Alert and  oriented x 4.  GU: Neg CVAT.  SPECULUM EXAM: Declined  Fetal heart rate 169 by Doppler.  LAB RESULTS Results for orders placed or performed during the hospital encounter of 04/25/18 (from the  past 24 hour(s))  Urinalysis, Routine w reflex microscopic     Status: Abnormal   Collection Time: 04/25/18 10:29 AM  Result Value Ref Range   Color, Urine YELLOW YELLOW   APPearance HAZY (A) CLEAR   Specific Gravity, Urine 1.015 1.005 - 1.030   pH 6.0 5.0 - 8.0   Glucose, UA NEGATIVE NEGATIVE mg/dL   Hgb urine dipstick MODERATE (A) NEGATIVE   Bilirubin Urine NEGATIVE NEGATIVE   Ketones, ur 20 (A) NEGATIVE mg/dL   Protein, ur NEGATIVE NEGATIVE mg/dL   Nitrite NEGATIVE NEGATIVE   Leukocytes, UA NEGATIVE NEGATIVE   RBC / HPF 0-5 0 - 5 RBC/hpf   WBC, UA 0-5 0 - 5 WBC/hpf   Bacteria, UA RARE (A) NONE SEEN   Squamous Epithelial / LPF 6-10 0 - 5   Mucus PRESENT   Pregnancy, urine POC     Status: Abnormal   Collection Time: 04/25/18 10:31 AM  Result Value Ref Range   Preg Test, Ur POSITIVE (A) NEGATIVE    IMAGING No results found.  MAU COURSE Orders Placed This Encounter  Procedures  . Culture, OB Urine  . Urinalysis, Routine w reflex microscopic  . Pregnancy, urine POC  . Discharge patient   Meds ordered this encounter  Medications  . ondansetron (ZOFRAN-ODT) disintegrating tablet 8 mg  . ondansetron (ZOFRAN ODT) 8 MG disintegrating tablet    Sig: Take 1 tablet (8 mg total) by mouth every 8 (eight) hours as needed for nausea or vomiting.    Dispense:  20 tablet    Refill:  2    Order Specific Question:   Supervising Provider    Answer:   Despina HiddenEURE, LUTHER H [[id number]]  . Doxylamine-Pyridoxine 10-10 MG TBEC    Sig: Take 1-2 tablets by mouth as directed. Take 2 tabs every evening. If needed, add 1 tab every morning. If needed, add 1 tab at mid-day.    Dispense:  60 tablet    Refill:  3    Order Specific Question:   Supervising Provider    Answer:   Duane LopeEURE, LUTHER H [[id number]]   Able to drink  and keep down fluids and crackers in MAU.  MDM - Exacerbation of N/V in pregnancy. Unsure if pregnancy related or infectious. Symptoms well-controlled w/ Zofran.  - Transient abd pain in pregnancy, resolved spontaneously. Likely 2/2 GI upset. Previously confirmed IUP. Pos FHTs today by doppler.  - Nosebleed, resolved prior to MAU visit. Likely 2/2 pregnancy hormone effect on mucus membranes.   ASSESSMENT 1. Nausea/vomiting in pregnancy   2. Nosebleed   3. Abdominal pain during pregnancy in first trimester     PLAN Discharge home in stable condition. First trimester and N/V precautions Advance diet slowly. Rx Zofran and Diclegis. D/C Phenergan.  Follow-up Information    [facility] on [facility]/2018.   Why:  As scheduled Contact information: [address] [place] [phone]       [facility] Follow up.   Why:  As needed if symptoms worsen Contact information: [address] [id number] [place] [phone]         Allergies as of 04/25/2018   No Known Allergies     Medication List    STOP taking these medications   aspirin-acetaminophen-caffeine 250-250-65 MG tablet Commonly known as:  EXCEDRIN MIGRAINE   OVER THE COUNTER MEDICATION   OVER THE COUNTER MEDICATION   promethazine 25  MG tablet Commonly known as:  PHENERGAN     TAKE these medications   Doxylamine-Pyridoxine 10-10 MG Tbec Take 1-2 tablets by mouth as directed. Take 2 tabs every evening. If needed, add 1 tab every morning. If needed, add 1 tab at mid-day.   ondansetron 8 MG disintegrating tablet Commonly known as:  ZOFRAN ODT Take 1 tablet (8 mg total) by mouth every 8 (eight) hours as needed for nausea or vomiting.     ASK your doctor about these medications   acetaminophen 500 MG tablet Commonly known as:  TYLENOL Take 500 mg by mouth every 6 (six)  hours as needed for mild pain.   FISH OIL PO Take 1 capsule by mouth daily.   VITAMIN C PO Take 1 tablet by mouth daily.   VITAMIN E PO Take 1 capsule by mouth daily.        Katrinka Blazing, [place], CNM 04/25/2018  1:06 PM

## 2018-04-27 ENCOUNTER — Ambulatory Visit (INDEPENDENT_AMBULATORY_CARE_PROVIDER_SITE_OTHER): Payer: Medicaid Other | Admitting: Obstetrics

## 2018-04-27 ENCOUNTER — Other Ambulatory Visit (HOSPITAL_COMMUNITY)
Admission: RE | Admit: 2018-04-27 | Discharge: 2018-04-27 | Disposition: A | Discharge status: Home / Self Care | Payer: Medicaid Other | Source: Ambulatory Visit | Attending: Obstetrics | Admitting: Obstetrics

## 2018-04-27 ENCOUNTER — Encounter: Payer: Self-pay | Admitting: Obstetrics

## 2018-04-27 ENCOUNTER — Telehealth: Payer: Self-pay

## 2018-04-27 VITALS — BP 108/74 | HR 89 | Wt 197.2 lb

## 2018-04-27 DIAGNOSIS — M545 Low back pain: Secondary | ICD-10-CM

## 2018-04-27 DIAGNOSIS — Z3481 Encounter for supervision of other normal pregnancy, first trimester: Secondary | ICD-10-CM

## 2018-04-27 DIAGNOSIS — Z1151 Encounter for screening for human papillomavirus (HPV): Secondary | ICD-10-CM | POA: Insufficient documentation

## 2018-04-27 DIAGNOSIS — B955 Unspecified streptococcus as the cause of diseases classified elsewhere: Secondary | ICD-10-CM

## 2018-04-27 DIAGNOSIS — Z3A11 11 weeks gestation of pregnancy: Secondary | ICD-10-CM | POA: Diagnosis not present

## 2018-04-27 DIAGNOSIS — N898 Other specified noninflammatory disorders of vagina: Secondary | ICD-10-CM

## 2018-04-27 DIAGNOSIS — O26891 Other specified pregnancy related conditions, first trimester: Secondary | ICD-10-CM | POA: Insufficient documentation

## 2018-04-27 DIAGNOSIS — G8929 Other chronic pain: Secondary | ICD-10-CM

## 2018-04-27 DIAGNOSIS — O219 Vomiting of pregnancy, unspecified: Secondary | ICD-10-CM

## 2018-04-27 DIAGNOSIS — Z3490 Encounter for supervision of normal pregnancy, unspecified, unspecified trimester: Secondary | ICD-10-CM | POA: Insufficient documentation

## 2018-04-27 DIAGNOSIS — O2341 Unspecified infection of urinary tract in pregnancy, first trimester: Secondary | ICD-10-CM

## 2018-04-27 DIAGNOSIS — O26899 Other specified pregnancy related conditions, unspecified trimester: Secondary | ICD-10-CM

## 2018-04-27 LAB — CULTURE, OB URINE
Culture: 1000 — AB
Special Requests: NORMAL

## 2018-04-27 MED ORDER — DOXYLAMINE-PYRIDOXINE 10-10 MG PO TBEC: 1.0000 | DELAYED_RELEASE_TABLET | ORAL | 3 refills | Status: DC

## 2018-04-27 MED ORDER — AMOXICILLIN-POT CLAVULANATE 875-125 MG PO TABS: 1.0000 | ORAL_TABLET | Freq: Two times a day (BID) | ORAL | 0 refills | Status: DC

## 2018-04-27 MED ORDER — COMFORT FIT MATERNITY SUPP SM MISC: 0 refills | Status: DC

## 2018-04-27 MED ORDER — VITAFOL ULTRA 29-0.6-0.4-200 MG PO CAPS: 1.0000 | ORAL_CAPSULE | Freq: Every day | ORAL | 4 refills | Status: DC

## 2018-04-27 NOTE — Progress Notes (Signed)
Subjective:  Katelyn Mcfarland is a 27 y.o. [id number] at 7466w2d being seen today for ongoing prenatal care.  She is currently monitored for the following issues for this low-risk pregnancy and has Current smoker; Missed abortion; and Supervision of normal pregnancy, antepartum on their problem list.  Patient reports nausea and lower abdominal pain.  Contractions: Not present. Vag. Bleeding: None.  Movement: Present. Denies leaking of fluid.   The following portions of the patient's history were reviewed and updated as appropriate: allergies, current medications, past family history, past medical history, past social history, past surgical history and problem list. Problem list updated.  Objective:   Vitals:   04/27/18 1039  BP: 108/74  Pulse: 89  Weight: 197 lb 3.2 oz (89.4 kg)    Fetal Status: Fetal Heart Rate (bpm): 160   Movement: Present     General:  Alert, oriented and cooperative. Patient is in no acute distress.  Skin: Skin is warm and dry. No rash noted.   Cardiovascular: Normal heart rate noted  Respiratory: Normal respiratory effort, no problems with respiration noted  Abdomen: Soft, gravid, appropriate for gestational age. Pain/Pressure: Present     Pelvic:  Cervical exam deferred        Extremities: Normal range of motion.  Edema: None  Mental Status: Normal mood and affect. Normal behavior. Normal judgment and thought content.   Urinalysis:      Assessment and Plan:  Pregnancy: [id number] at 4866w2d  1. Encounter for supervision of normal pregnancy, antepartum, unspecified gravidity Rx: - Obstetric Panel, Including HIV - Culture, OB Urine - Cytology - PAP - Genetic Screening - Hemoglobinopathy evaluation  2. Vaginal discharge during pregnancy, antepartum Rx: - Cervicovaginal ancillary only  Preterm labor symptoms and general obstetric precautions including but not limited to vaginal bleeding, contractions, leaking of fluid and fetal movement were reviewed in detail  with the patient. Please refer to After Visit Summary for other counseling recommendations.  Return in about 1 month (around 05/25/2018) for ROB.   Brock BadHarper, Charles A, MD

## 2018-04-27 NOTE — Progress Notes (Signed)
ROB complaint of nausea Rx meds are not helping.  Lower back pain and abdominal pain.  Pregnancy  was planned pt has supportive spouse.  Genetic Testing: Desires Last pap : on file 11/01/2013 [facility] Per pt unsure when last pap was and was not w/ our office.

## 2018-04-27 NOTE — Telephone Encounter (Signed)
Called pt and made her aware of GBS in urine results from urine culture and advised her that Dr. Clearance CootsHarper sent antibiotics to her pharmacy to treat her. Pt verbalized understanding.

## 2018-04-28 ENCOUNTER — Encounter: Payer: Self-pay | Admitting: Advanced Practice Midwife

## 2018-04-28 ENCOUNTER — Telehealth: Payer: Self-pay

## 2018-04-28 DIAGNOSIS — R8271 Bacteriuria: Secondary | ICD-10-CM | POA: Insufficient documentation

## 2018-04-28 LAB — CERVICOVAGINAL ANCILLARY ONLY
Bacterial vaginitis: NEGATIVE
Candida vaginitis: POSITIVE — AB
Chlamydia: NEGATIVE
Neisseria Gonorrhea: NEGATIVE
Trichomonas: NEGATIVE

## 2018-04-28 LAB — CYTOLOGY - PAP
Diagnosis: NEGATIVE
HPV: NOT DETECTED

## 2018-04-28 NOTE — Telephone Encounter (Signed)
Pt called stating she is having sever nausea and vomiting. Pt was prescribed antibiotics for Group B strep and stated that she picked them up yesterday and tried to take them and cant keep them down. Pt states she hasn't been able to eat food in 3 days or so and is having trouble keeping liquids down. Pt states she is only urinating 2-3 times a day. Pt has been prescribed promethazine and zofran orally and pt states neither one is helping at all. I advised pt to go to the hospital for her symptoms of dehydration to be treated. Pt verbalized understanding.

## 2018-04-29 ENCOUNTER — Encounter (HOSPITAL_COMMUNITY): Payer: Self-pay | Admitting: *Deleted

## 2018-04-29 ENCOUNTER — Other Ambulatory Visit: Payer: Self-pay | Admitting: Obstetrics

## 2018-04-29 ENCOUNTER — Inpatient Hospital Stay (HOSPITAL_COMMUNITY)
Admission: AD | Admit: 2018-04-29 | Discharge: 2018-04-29 | Disposition: A | Discharge status: Home / Self Care | Payer: Medicaid Other | Source: Ambulatory Visit | Attending: Obstetrics & Gynecology | Admitting: Obstetrics & Gynecology

## 2018-04-29 DIAGNOSIS — Z809 Family history of malignant neoplasm, unspecified: Secondary | ICD-10-CM | POA: Insufficient documentation

## 2018-04-29 DIAGNOSIS — B373 Candidiasis of vulva and vagina: Secondary | ICD-10-CM

## 2018-04-29 DIAGNOSIS — Z8249 Family history of ischemic heart disease and other diseases of the circulatory system: Secondary | ICD-10-CM | POA: Insufficient documentation

## 2018-04-29 DIAGNOSIS — Z841 Family history of disorders of kidney and ureter: Secondary | ICD-10-CM | POA: Insufficient documentation

## 2018-04-29 DIAGNOSIS — Z9889 Other specified postprocedural states: Secondary | ICD-10-CM | POA: Diagnosis not present

## 2018-04-29 DIAGNOSIS — O210 Mild hyperemesis gravidarum: Secondary | ICD-10-CM

## 2018-04-29 DIAGNOSIS — O99281 Endocrine, nutritional and metabolic diseases complicating pregnancy, first trimester: Secondary | ICD-10-CM | POA: Insufficient documentation

## 2018-04-29 DIAGNOSIS — E860 Dehydration: Secondary | ICD-10-CM | POA: Diagnosis not present

## 2018-04-29 DIAGNOSIS — Z833 Family history of diabetes mellitus: Secondary | ICD-10-CM | POA: Insufficient documentation

## 2018-04-29 DIAGNOSIS — Z825 Family history of asthma and other chronic lower respiratory diseases: Secondary | ICD-10-CM | POA: Insufficient documentation

## 2018-04-29 DIAGNOSIS — Z79899 Other long term (current) drug therapy: Secondary | ICD-10-CM | POA: Insufficient documentation

## 2018-04-29 DIAGNOSIS — Z3A11 11 weeks gestation of pregnancy: Secondary | ICD-10-CM

## 2018-04-29 DIAGNOSIS — O219 Vomiting of pregnancy, unspecified: Secondary | ICD-10-CM | POA: Diagnosis not present

## 2018-04-29 DIAGNOSIS — B3731 Acute candidiasis of vulva and vagina: Secondary | ICD-10-CM

## 2018-04-29 LAB — URINALYSIS, ROUTINE W REFLEX MICROSCOPIC
Bilirubin Urine: NEGATIVE
Glucose, UA: NEGATIVE mg/dL
Ketones, ur: 80 mg/dL — AB
Nitrite: NEGATIVE
Protein, ur: 30 mg/dL — AB
Specific Gravity, Urine: 1.025 (ref 1.005–1.030)
pH: 5 (ref 5.0–8.0)

## 2018-04-29 LAB — CBC
HCT: 36.9 % (ref 36.0–46.0)
Hemoglobin: 12.8 g/dL (ref 12.0–15.0)
MCH: 30 pg (ref 26.0–34.0)
MCHC: 34.7 g/dL (ref 30.0–36.0)
MCV: 86.4 fL (ref 78.0–100.0)
Platelets: 228 10*3/uL (ref 150–400)
RBC: 4.27 MIL/uL (ref 3.87–5.11)
RDW: 13.2 % (ref 11.5–15.5)
WBC: 8.6 10*3/uL (ref 4.0–10.5)

## 2018-04-29 LAB — COMPREHENSIVE METABOLIC PANEL
ALT: 19 U/L (ref 0–44)
AST: 28 U/L (ref 15–41)
Albumin: 3.9 g/dL (ref 3.5–5.0)
Alkaline Phosphatase: 46 U/L (ref 38–126)
Anion gap: 13 (ref 5–15)
BUN: 7 mg/dL (ref 6–20)
CO2: 20 mmol/L — ABNORMAL LOW (ref 22–32)
Calcium: 9.3 mg/dL (ref 8.9–10.3)
Chloride: 102 mmol/L (ref 98–111)
Creatinine, Ser: 0.74 mg/dL (ref 0.44–1.00)
GFR calc Af Amer: >60 mL/min (ref >60)
GFR calc non Af Amer: >60 mL/min (ref >60)
Glucose, Bld: 79 mg/dL (ref 70–99)
Potassium: 3.8 mmol/L (ref 3.5–5.1)
Sodium: 135 mmol/L (ref 135–145)
Total Bilirubin: 1.1 mg/dL (ref 0.3–1.2)
Total Protein: 7.7 g/dL (ref 6.5–8.1)

## 2018-04-29 LAB — CULTURE, OB URINE

## 2018-04-29 LAB — URINE CULTURE, OB REFLEX

## 2018-04-29 MED ORDER — SCOPOLAMINE 1 MG/3DAYS TD PT72: 1.0000 | MEDICATED_PATCH | TRANSDERMAL | 1 refills | Status: DC

## 2018-04-29 MED ORDER — FAMOTIDINE IN NACL 20-0.9 MG/50ML-% IV SOLN
20.0000 mg | Freq: Once | INTRAVENOUS | Status: AC
  Administered 2018-04-29: 20 mg via INTRAVENOUS
  Filled 2018-04-29: qty 50

## 2018-04-29 MED ORDER — LACTATED RINGERS IV BOLUS
1000.0000 mL | Freq: Once | INTRAVENOUS | Status: AC
  Administered 2018-04-29: 1000 mL via INTRAVENOUS

## 2018-04-29 MED ORDER — METOCLOPRAMIDE HCL 5 MG/ML IJ SOLN
10.0000 mg | Freq: Once | INTRAMUSCULAR | Status: AC
  Administered 2018-04-29: 10 mg via INTRAVENOUS
  Filled 2018-04-29: qty 2

## 2018-04-29 MED ORDER — RANITIDINE HCL 150 MG PO CAPS: 150.0000 mg | ORAL_CAPSULE | Freq: Every evening | ORAL | 1 refills | Status: DC

## 2018-04-29 MED ORDER — M.V.I. ADULT IV INJ
Freq: Once | INTRAVENOUS | Status: AC
  Administered 2018-04-29: 13:00:00 via INTRAVENOUS
  Filled 2018-04-29: qty 10

## 2018-04-29 MED ORDER — TERCONAZOLE 0.8 % VA CREA: 1.0000 | TOPICAL_CREAM | Freq: Every day | VAGINAL | 0 refills | Status: DC

## 2018-04-29 MED ORDER — SCOPOLAMINE 1 MG/3DAYS TD PT72
1.0000 | MEDICATED_PATCH | TRANSDERMAL | Status: DC
  Administered 2018-04-29: 1.5 mg via TRANSDERMAL
  Filled 2018-04-29: qty 1

## 2018-04-29 MED ORDER — METOCLOPRAMIDE HCL 10 MG PO TABS: 10.0000 mg | ORAL_TABLET | Freq: Four times a day (QID) | ORAL | 1 refills | Status: DC | PRN

## 2018-04-29 NOTE — Discharge Instructions (Signed)
Hyperemesis Gravidarum °Hyperemesis gravidarum is a severe form of nausea and vomiting that happens during pregnancy. Hyperemesis is worse than morning sickness. It may cause you to have nausea or vomiting all day for many days. It may keep you from eating and drinking enough food and liquids. Hyperemesis usually occurs during the first half (the first 20 weeks) of pregnancy. It often goes away once a woman is in her second half of pregnancy. However, sometimes hyperemesis continues through an entire pregnancy. °What are the causes? °The cause of this condition is not known. It may be related to changes in chemicals (hormones) in the body during pregnancy, such as the high level of pregnancy hormone (human chorionic gonadotropin) or the increase in the female sex hormone (estrogen). °What are the signs or symptoms? °Symptoms of this condition include: °· Severe nausea and vomiting. °· Nausea that does not go away. °· Vomiting that does not allow you to keep any food down. °· Weight loss. °· Body fluid loss (dehydration). °· Having no desire to eat, or not liking food that you have previously enjoyed. ° °How is this diagnosed? °This condition may be diagnosed based on: °· A physical exam. °· Your medical history. °· Your symptoms. °· Blood tests. °· Urine tests. ° °How is this treated? °This condition may be managed with medicine. If medicines to do not help relieve nausea and vomiting, you may need to receive fluids through an IV tube at the hospital. °Follow these instructions at home: °· Take over-the-counter and prescription medicines only as told by your health care provider. °· Avoid iron pills and multivitamins that contain iron for the first 3-4 months of pregnancy. If you take prescription iron pills, do not stop taking them unless your health care provider approves. °· Take the following actions to help prevent nausea and vomiting: °? In the morning, before getting out of bed, try eating a couple of dry  crackers or a piece of toast. °? Avoid foods and smells that upset your stomach. Fatty and spicy foods may make nausea worse. °? Eat 5-6 small meals a day. °? Do not drink fluids while eating meals. Drink between meals. °? Eat or suck on things that have ginger in them. Ginger can help relieve nausea. °? Avoid food preparation. The smell of food can spoil your appetite or trigger nausea. °· Follow instructions from your health care provider about eating or drinking restrictions. °· For snacks, eat high-protein foods, such as cheese. °· Keep all follow-up and pre-birth (prenatal) visits as told by your health care provider. This is important. °Contact a health care provider if: °· You have pain in your abdomen. °· You have a severe headache. °· You have vision problems. °· You are losing weight. °Get help right away if: °· You cannot drink fluids without vomiting. °· You vomit blood. °· You have constant nausea and vomiting. °· You are very weak. °· You are very thirsty. °· You feel dizzy. °· You faint. °· You have a fever or other symptoms that last for more than 2-3 days. °· You have a fever and your symptoms suddenly get worse. °Summary °· Hyperemesis gravidarum is a severe form of nausea and vomiting that happens during pregnancy. °· Making some changes to your eating habits may help relieve nausea and vomiting. °· This condition may be managed with medicine. °· If medicines to do not help relieve nausea and vomiting, you may need to receive fluids through an IV tube at the hospital. °This   information is not intended to replace advice given to you by your health care provider. Make sure you discuss any questions you have with your health care provider. °Document Released: 09/29/2005 Document Revised: 05/28/2016 Document Reviewed: 05/28/2016 °Elsevier Interactive Patient Education © 2017 [employer] °Eating Plan for Hyperemesis Gravidarum °Hyperemesis gravidarum is a severe form of morning sickness. Because this  condition causes severe nausea and vomiting, it can lead to dehydration, malnutrition, and weight loss. One way to lessen the symptoms of nausea and vomiting is to follow the eating plan for hyperemesis gravidarum. It is often used along with prescribed medicines to control your symptoms. °What can I do to relieve my symptoms? °Listen to your body. Everyone is different and has different preferences. Find what works best for you. Take any of the following actions that are helpful to you: °· Eat and drink slowly. °· Eat 5-6 small meals daily instead of 3 large meals. °· Eat crackers before you get out of bed in the morning. °· Try having a snack in the middle of the night. °· Starchy foods are usually tolerated well. Examples include cereal, toast, bread, potatoes, pasta, rice, and pretzels. °· Ginger may help with nausea. Add ¼ tsp ground ginger to hot tea or choose ginger tea. °· Try drinking 100% fruit juice or an electrolyte drink. An electrolyte drink contains sodium, potassium, and chloride. °· Continue to take your prenatal vitamins as told by your health care provider. If you are having trouble taking your prenatal vitamins, talk with your health care provider about different options. °· Include at least 1 serving of protein with your meals and snacks. Protein options include meats or poultry, beans, nuts, eggs, and yogurt. Try eating a protein-rich snack before bed. Examples of these snacks include cheese and crackers or half of a peanut butter or turkey sandwich. °· Consider eliminating foods that trigger your symptoms. These may include spicy foods, coffee, high-fat foods, very sweet foods, and acidic foods. °· Try meals that have more protein combined with bland, salty, lower-fat, and dry foods, such as nuts, seeds, pretzels, crackers, and cereal. °· Talk with your healthcare provider about starting a supplement of vitamin B6. °· Have fluids that are cold, clear, and carbonated or sour. Examples include  lemonade, ginger ale, lemon-lime soda, ice water, and sparkling water. °· Try lemon or mint tea. °· Try brushing your teeth or using a mouth rinse after meals. ° °What should I avoid to reduce my symptoms? °Avoiding some of the following things may help reduce your symptoms. °· Foods with strong smells. Try eating meals in well-ventilated areas that are free of odors. °· Drinking water or other beverages with meals. Try not to drink anything during the 30 minutes before and after your meals. °· Drinking more than 1 cup of fluid at a time. Sometimes using a straw helps. °· Fried or high-fat foods, such as butter and cream sauces. °· Spicy foods. °· Skipping meals as best as you can. Nausea can be more intense on an empty stomach. If you cannot tolerate food at that time, do not force it. Try sucking on ice chips or other frozen items, and make up for missed calories later. °· Lying down within 2 hours after eating. °· Environmental triggers. These may include smoky rooms, closed spaces, rooms with strong smells, warm or humid places, overly loud and noisy rooms, and rooms with motion or flickering lights. °· Quick and sudden changes in your movement. ° °This information is not   intended to replace advice given to you by your health care provider. Make sure you discuss any questions you have with your health care provider. °Document Released: 07/27/2007 Document Revised: 05/28/2016 Document Reviewed: 04/29/2016 °Elsevier Interactive Patient Education © 2018 Elsevier Inc. ° °

## 2018-04-29 NOTE — MAU Note (Addendum)
Fetal tracing from 1757 to 1832 on 7/18 does not belong to Dolores Loryeresa Brabson, belongs to MRN [id number]. Interface error.

## 2018-04-29 NOTE — MAU Provider Note (Addendum)
History     CSN: [id number]  Arrival date and time: 04/29/18 [id number]   First Provider Initiated Contact with Patient 04/29/18 1026      Chief Complaint  Patient presents with  . Nausea  . Emesis   [id number] @11 .4 wks here with N/V. Sx have been an ongoing problem for her and she has been previously treated with Zofran and Phenergan. She was seen 4 days ago in MAU for this. She has lost 7 lbs since then. She cannot tolerate anything po. Reports both meds do not help.   OB History    Gravida  6   Para  2   Term  2   Preterm  0   AB  3   Living  2     SAB  2   TAB  1   Ectopic  0   Multiple  0   Live Births  2           Past Medical History:  Diagnosis Date  . Allergy   . Chlamydia    age 27yo; hx/o trichomonas age 27yo  . Chronic headache   . Depression    doing ok, declined meds  . High cholesterol   . Infection    UTI  . Vaginal Pap smear, abnormal    bx, cryo    Past Surgical History:  Procedure Laterality Date  . INDUCED ABORTION    . WISDOM TOOTH EXTRACTION      Family History  Problem Relation Age of Onset  . Hypertension Mother   . Diabetes Mother   . Asthma Sister   . Cancer Paternal Grandmother   . Cancer Paternal Grandfather   . Cancer Maternal Grandmother   . Kidney disease Maternal Grandmother   . Diabetes Maternal Grandmother   . Heart disease Neg Hx   . Stroke Neg Hx     Social History   Tobacco Use  . Smoking status: Former Smoker    Packs/day: 0.25    Years: 16.00    Pack years: 4.00    Types: Cigarettes  . Smokeless tobacco: Never Used  . Tobacco comment: quit x 3 wk as of 03/2012  Substance Use Topics  . Alcohol use: Yes    Comment: not currently  . Drug use: No    Types: Marijuana    Comment: last Mar 2018    Allergies: No Known Allergies  Medications Prior to Admission  Medication Sig Dispense Refill Last Dose  . acetaminophen (TYLENOL) 500 MG tablet Take 500 mg by mouth every 6 (six) hours as needed  for mild pain.   Not Taking  . amoxicillin-clavulanate (AUGMENTIN) 875-125 MG tablet Take 1 tablet by mouth 2 (two) times daily. 14 tablet 0   . Ascorbic Acid (VITAMIN C PO) Take 1 tablet by mouth daily.   Not Taking  . Doxylamine-Pyridoxine 10-10 MG TBEC Take 1-2 tablets by mouth as directed. Take 2 tabs every evening. If needed, add 1 tab every morning. If needed, add 1 tab at mid-day. 60 tablet 3   . Elastic Bandages & Supports (COMFORT FIT MATERNITY SUPP SM) MISC Wear as directed. 1 each 0   . Omega-3 Fatty Acids (FISH OIL PO) Take 1 capsule by mouth daily.   Not Taking  . ondansetron (ZOFRAN ODT) 8 MG disintegrating tablet Take 1 tablet (8 mg total) by mouth every 8 (eight) hours as needed for nausea or vomiting. 20 tablet 2 Taking  . Prenat-Fe Poly-Methfol-FA-DHA (VITAFOL ULTRA) 29-0.6-0.4-200 MG  CAPS Take 1 tablet by mouth daily before breakfast. 90 capsule 4   . terconazole (TERAZOL 3) 0.8 % vaginal cream Place 1 applicator vaginally at bedtime. 20 g 0   . VITAMIN E PO Take 1 capsule by mouth daily.   Not Taking    Review of Systems  Gastrointestinal: Positive for abdominal pain, nausea and vomiting.  Genitourinary: Negative for vaginal bleeding.   Physical Exam   Blood pressure 130/85, pulse 98, temperature 98.6 F (37 C), temperature source Oral, resp. rate 16, weight 193 lb 4 oz (87.7 kg), last menstrual period 02/07/2018, SpO2 97 %, unknown if currently breastfeeding.  Physical Exam  Nursing note and vitals reviewed. Constitutional: She is oriented to person, place, and time. She appears well-developed and well-nourished. No distress.  HENT:  Head: Normocephalic and atraumatic.  Neck: Normal range of motion.  Respiratory: Effort normal. No respiratory distress.  GI: Soft. She exhibits no distension. There is no tenderness.  Musculoskeletal: Normal range of motion.  Neurological: She is alert and oriented to person, place, and time.  Skin: Skin is warm and dry.   Psychiatric: She has a normal mood and affect.   FHT unable to obtain with doppler Limited bedside US: viable, active fetus, +cardiac activity, subj. nml AFV  Results for orders placed or performed during the hospital encounter of 04/29/18 (from the past 24 hour(s))  Urinalysis, Routine w reflex microscopic     Status: Abnormal   Collection Time: 04/29/18  9:46 AM  Result Value Ref Range   Color, Urine AMBER (A) YELLOW   APPearance CLOUDY (A) CLEAR   Specific Gravity, Urine 1.025 1.005 - 1.030   pH 5.0 5.0 - 8.0   Glucose, UA NEGATIVE NEGATIVE mg/dL   Hgb urine dipstick MODERATE (A) NEGATIVE   Bilirubin Urine NEGATIVE NEGATIVE   Ketones, ur 80 (A) NEGATIVE mg/dL   Protein, ur 30 (A) NEGATIVE mg/dL   Nitrite NEGATIVE NEGATIVE   Leukocytes, UA SMALL (A) NEGATIVE   RBC / HPF 0-5 0 - 5 RBC/hpf   WBC, UA 0-5 0 - 5 WBC/hpf   Bacteria, UA RARE (A) NONE SEEN   Squamous Epithelial / LPF 21-50 0 - 5   Mucus PRESENT    Hyaline Casts, UA PRESENT   CBC     Status: None   Collection Time: 04/29/18 10:32 AM  Result Value Ref Range   WBC 8.6 4.0 - 10.5 K/uL   RBC 4.27 3.87 - 5.11 MIL/uL   Hemoglobin 12.8 12.0 - 15.0 g/dL   HCT 26.9 48.5 - 46.2 %   MCV 86.4 78.0 - 100.0 fL   MCH 30.0 26.0 - 34.0 pg   MCHC 34.7 30.0 - 36.0 g/dL   RDW 70.3 50.0 - 93.8 %   Platelets 228 150 - 400 K/uL  Comprehensive metabolic panel     Status: Abnormal   Collection Time: 04/29/18 10:32 AM  Result Value Ref Range   Sodium 135 135 - 145 mmol/L   Potassium 3.8 3.5 - 5.1 mmol/L   Chloride 102 98 - 111 mmol/L   CO2 20 (L) 22 - 32 mmol/L   Glucose, Bld 79 70 - 99 mg/dL   BUN 7 6 - 20 mg/dL   Creatinine, Ser 1.82 0.44 - 1.00 mg/dL   Calcium 9.3 8.9 - 99.3 mg/dL   Total Protein 7.7 6.5 - 8.1 g/dL   Albumin 3.9 3.5 - 5.0 g/dL   AST 28 15 - 41 U/L   ALT 19 0 -  44 U/L   Alkaline Phosphatase 46 38 - 126 U/L   Total Bilirubin 1.1 0.3 - 1.2 mg/dL   GFR calc non Af Amer >60 >60 mL/min   GFR calc Af Amer >60  >60 mL/min   Anion gap 13 5 - 15   MAU Course  Procedures LR Reglan Pepcid Scop patch MTV IV  MDM Labs ordered and reviewed. Tolerating po after meds, feels better. No emesis. Stable for discharge. Discussed regimen of Scop patch, Reglan, and Zantac.  Assessment and Plan   1. [redacted] weeks gestation of pregnancy   2. Hyperemesis gravidarum   3. Dehydration    Discharge home Follow up in OB office as scheduled Hydrate HEG diet  Allergies as of 04/29/2018   No Known Allergies     Medication List    STOP taking these medications   Doxylamine-Pyridoxine 10-10 MG Tbec   ondansetron 8 MG disintegrating tablet Commonly known as:  ZOFRAN ODT     TAKE these medications   acetaminophen 500 MG tablet Commonly known as:  TYLENOL Take 500 mg by mouth every 6 (six) hours as needed for mild pain.   amoxicillin-clavulanate 875-125 MG tablet Commonly known as:  AUGMENTIN Take 1 tablet by mouth 2 (two) times daily.   COMFORT FIT MATERNITY SUPP SM Misc Wear as directed.   FISH OIL PO Take 1 capsule by mouth daily.   metoCLOPramide 10 MG tablet Commonly known as:  REGLAN Take 1 tablet (10 mg total) by mouth every 6 (six) hours as needed for nausea.   ranitidine 150 MG capsule Commonly known as:  ZANTAC Take 1 capsule (150 mg total) by mouth every evening.   scopolamine 1 MG/3DAYS Commonly known as:  TRANSDERM-SCOP Place 1 patch (1.5 mg total) onto the skin every 3 (three) days. Start taking on:  05/02/2018   terconazole 0.8 % vaginal cream Commonly known as:  TERAZOL 3 Place 1 applicator vaginally at bedtime.   VITAFOL ULTRA 29-0.6-0.4-200 MG Caps Take 1 tablet by mouth daily before breakfast.   VITAMIN C PO Take 1 tablet by mouth daily.   VITAMIN E PO Take 1 capsule by mouth daily.      Donette Larry, CNM 04/29/2018, 11:04 AM

## 2018-04-29 NOTE — Progress Notes (Signed)
Pump scanning issues:  Pump turned off--beeping channel error during infusion of Multivitamins.  200ml LTC infused after error message.    Lactated ringers infusion paused and restarted while giving Reglan IVP.  Difficulty getting [facility] with the right times.  Had to manually program pump for Primary fluids.  York SpanielSaid it was sending the order to the pump, but would not connect.

## 2018-04-29 NOTE — MAU Note (Signed)
Patient presents with persistant nausea and vomiting that has not improved with phenergan or zofran.  She was seen in MAU about a week ago and still having n/v multiple times a day. "I can't keep anything down."  States her "throat is burning" and her abdomen is sore from vomiting so much.

## 2018-04-30 ENCOUNTER — Inpatient Hospital Stay (HOSPITAL_COMMUNITY)
Admission: AD | Admit: 2018-04-30 | Discharge: 2018-05-03 | DRG: 832 | Disposition: A | Discharge status: Home / Self Care | Payer: Medicaid Other | Attending: Obstetrics and Gynecology | Admitting: Obstetrics and Gynecology

## 2018-04-30 ENCOUNTER — Encounter (HOSPITAL_COMMUNITY): Payer: Self-pay | Admitting: *Deleted

## 2018-04-30 DIAGNOSIS — O99011 Anemia complicating pregnancy, first trimester: Secondary | ICD-10-CM | POA: Diagnosis present

## 2018-04-30 DIAGNOSIS — R1110 Vomiting, unspecified: Secondary | ICD-10-CM

## 2018-04-30 DIAGNOSIS — O99210 Obesity complicating pregnancy, unspecified trimester: Secondary | ICD-10-CM | POA: Diagnosis present

## 2018-04-30 DIAGNOSIS — R112 Nausea with vomiting, unspecified: Secondary | ICD-10-CM

## 2018-04-30 DIAGNOSIS — Z3A11 11 weeks gestation of pregnancy: Secondary | ICD-10-CM

## 2018-04-30 DIAGNOSIS — O99211 Obesity complicating pregnancy, first trimester: Secondary | ICD-10-CM | POA: Diagnosis present

## 2018-04-30 DIAGNOSIS — E860 Dehydration: Secondary | ICD-10-CM

## 2018-04-30 DIAGNOSIS — E669 Obesity, unspecified: Secondary | ICD-10-CM | POA: Diagnosis present

## 2018-04-30 DIAGNOSIS — O219 Vomiting of pregnancy, unspecified: Secondary | ICD-10-CM | POA: Diagnosis present

## 2018-04-30 DIAGNOSIS — D573 Sickle-cell trait: Secondary | ICD-10-CM | POA: Diagnosis present

## 2018-04-30 DIAGNOSIS — O211 Hyperemesis gravidarum with metabolic disturbance: Principal | ICD-10-CM | POA: Diagnosis present

## 2018-04-30 DIAGNOSIS — Z87891 Personal history of nicotine dependence: Secondary | ICD-10-CM

## 2018-04-30 DIAGNOSIS — O2341 Unspecified infection of urinary tract in pregnancy, first trimester: Secondary | ICD-10-CM | POA: Diagnosis present

## 2018-04-30 LAB — OBSTETRIC PANEL, INCLUDING HIV
Basophils Absolute: 0 10*3/uL (ref 0.0–0.2)
Basos: 0 %
EOS (ABSOLUTE): 0 10*3/uL (ref 0.0–0.4)
Eos: 0 %
HIV Screen 4th Generation wRfx: NONREACTIVE
Hematocrit: 36.1 % (ref 34.0–46.6)
Hemoglobin: 12 g/dL (ref 11.1–15.9)
Hepatitis B Surface Ag: NEGATIVE
Immature Grans (Abs): 0 10*3/uL (ref 0.0–0.1)
Immature Granulocytes: 0 %
Lymphocytes Absolute: 1.8 10*3/uL (ref 0.7–3.1)
Lymphs: 20 %
MCH: 29.3 pg (ref 26.6–33.0)
MCHC: 33.2 g/dL (ref 31.5–35.7)
MCV: 88 fL (ref 79–97)
Monocytes Absolute: 0.4 10*3/uL (ref 0.1–0.9)
Monocytes: 4 %
Neutrophils Absolute: 6.4 10*3/uL (ref 1.4–7.0)
Neutrophils: 76 %
Platelets: 223 10*3/uL (ref 150–450)
RBC: 4.09 x10E6/uL (ref 3.77–5.28)
RDW: 13.9 % (ref 12.3–15.4)
RPR Ser Ql: NONREACTIVE
Rh Factor: POSITIVE
Rubella Antibodies, IGG: 9.06 index (ref >0.99)
WBC: 8.6 10*3/uL (ref 3.4–10.8)

## 2018-04-30 LAB — HEMOGLOBINOPATHY EVALUATION
HGB C: 0 %
HGB S: 39.2 % — ABNORMAL HIGH
HGB VARIANT: 0 %
Hemoglobin A2 Quantitation: 3.5 % — ABNORMAL HIGH (ref 1.8–3.2)
Hemoglobin F Quantitation: 0 % (ref 0.0–2.0)
Hgb A: 57.3 % — ABNORMAL LOW (ref 96.4–98.8)

## 2018-04-30 LAB — AB SCR+ANTIBODY ID
Antibody Screen: POSITIVE — AB
Coombs Titer #1: 1

## 2018-04-30 MED ORDER — M.V.I. ADULT IV INJ
Freq: Once | INTRAVENOUS | Status: AC
  Administered 2018-05-01: 02:00:00 via INTRAVENOUS
  Filled 2018-04-30: qty 10

## 2018-04-30 MED ORDER — FAMOTIDINE IN NACL 20-0.9 MG/50ML-% IV SOLN
20.0000 mg | Freq: Once | INTRAVENOUS | Status: AC
  Administered 2018-05-01: 20 mg via INTRAVENOUS
  Filled 2018-04-30: qty 50

## 2018-04-30 MED ORDER — ONDANSETRON HCL 4 MG/2ML IJ SOLN
4.0000 mg | Freq: Four times a day (QID) | INTRAMUSCULAR | Status: DC | PRN
  Administered 2018-05-01: 4 mg via INTRAVENOUS
  Filled 2018-04-30: qty 2

## 2018-04-30 MED ORDER — SODIUM CHLORIDE 0.9 % IV SOLN
2.0000 g | Freq: Once | INTRAVENOUS | Status: AC
  Administered 2018-05-01: 2 g via INTRAVENOUS
  Filled 2018-04-30: qty 2

## 2018-04-30 MED ORDER — LACTATED RINGERS IV SOLN
INTRAVENOUS | Status: DC
  Administered 2018-04-30: via INTRAVENOUS

## 2018-04-30 MED ORDER — DEXAMETHASONE SODIUM PHOSPHATE 10 MG/ML IJ SOLN
10.0000 mg | Freq: Once | INTRAMUSCULAR | Status: AC
  Administered 2018-05-01: 10 mg via INTRAVENOUS
  Filled 2018-04-30: qty 1

## 2018-04-30 NOTE — MAU Note (Addendum)
Was seen here yesterday for n/v and given IVFs and sent home with meds for nausea. Unable to keep down meds. And vomiting continues. No diarrhea. Pt unable to get urine specimen currently

## 2018-05-01 ENCOUNTER — Observation Stay (HOSPITAL_COMMUNITY): Payer: Medicaid Other

## 2018-05-01 ENCOUNTER — Encounter (HOSPITAL_COMMUNITY): Payer: Self-pay | Admitting: *Deleted

## 2018-05-01 ENCOUNTER — Other Ambulatory Visit: Payer: Self-pay

## 2018-05-01 DIAGNOSIS — D573 Sickle-cell trait: Secondary | ICD-10-CM | POA: Diagnosis present

## 2018-05-01 DIAGNOSIS — O99210 Obesity complicating pregnancy, unspecified trimester: Secondary | ICD-10-CM | POA: Diagnosis present

## 2018-05-01 DIAGNOSIS — E669 Obesity, unspecified: Secondary | ICD-10-CM | POA: Diagnosis present

## 2018-05-01 DIAGNOSIS — O219 Vomiting of pregnancy, unspecified: Secondary | ICD-10-CM | POA: Diagnosis present

## 2018-05-01 LAB — TSH: TSH: 0.795 u[IU]/mL (ref 0.350–4.500)

## 2018-05-01 MED ORDER — PRENATAL MULTIVITAMIN CH
1.0000 | ORAL_TABLET | Freq: Every day | ORAL | Status: DC
  Administered 2018-05-01: 1 via ORAL
  Filled 2018-05-01 (×2): qty 1

## 2018-05-01 MED ORDER — DOCUSATE SODIUM 100 MG PO CAPS
100.0000 mg | ORAL_CAPSULE | Freq: Every day | ORAL | Status: DC
  Filled 2018-05-01 (×2): qty 1

## 2018-05-01 MED ORDER — METOCLOPRAMIDE HCL 5 MG/ML IJ SOLN
10.0000 mg | Freq: Four times a day (QID) | INTRAMUSCULAR | Status: DC
  Administered 2018-05-01 – 2018-05-03 (×7): 10 mg via INTRAVENOUS
  Filled 2018-05-01 (×7): qty 2

## 2018-05-01 MED ORDER — PROMETHAZINE HCL 25 MG/ML IJ SOLN: 25.0000 mg | Freq: Four times a day (QID) | INTRAMUSCULAR | Status: DC

## 2018-05-01 MED ORDER — DEXTROSE IN LACTATED RINGERS 5 % IV SOLN
INTRAVENOUS | Status: AC
  Administered 2018-05-01 – 2018-05-02 (×2): via INTRAVENOUS

## 2018-05-01 MED ORDER — ZOLPIDEM TARTRATE 5 MG PO TABS: 5.0000 mg | ORAL_TABLET | Freq: Every evening | ORAL | Status: DC | PRN

## 2018-05-01 MED ORDER — LACTATED RINGERS IV SOLN
INTRAVENOUS | Status: AC
  Administered 2018-05-01: 03:00:00 via INTRAVENOUS

## 2018-05-01 MED ORDER — CALCIUM CARBONATE ANTACID 500 MG PO CHEW: 2.0000 | CHEWABLE_TABLET | ORAL | Status: DC | PRN

## 2018-05-01 MED ORDER — ONDANSETRON HCL 4 MG/2ML IJ SOLN: 4.0000 mg | Freq: Four times a day (QID) | INTRAMUSCULAR | Status: DC | PRN

## 2018-05-01 MED ORDER — SCOPOLAMINE 1 MG/3DAYS TD PT72
1.0000 | MEDICATED_PATCH | TRANSDERMAL | Status: DC
  Administered 2018-05-01: 1.5 mg via TRANSDERMAL
  Filled 2018-05-01 (×3): qty 1

## 2018-05-01 MED ORDER — PYRIDOXINE HCL 100 MG/ML IJ SOLN
50.0000 mg | INTRAMUSCULAR | Status: DC
  Administered 2018-05-01 – 2018-05-02 (×2): 50 mg via INTRAVENOUS
  Filled 2018-05-01 (×2): qty 0.5

## 2018-05-01 MED ORDER — ACETAMINOPHEN 325 MG PO TABS
650.0000 mg | ORAL_TABLET | ORAL | Status: DC | PRN
  Administered 2018-05-01: 650 mg via ORAL
  Filled 2018-05-01: qty 2

## 2018-05-01 MED ORDER — ONDANSETRON HCL 4 MG/2ML IJ SOLN
4.0000 mg | Freq: Four times a day (QID) | INTRAMUSCULAR | Status: DC
  Administered 2018-05-01 – 2018-05-03 (×9): 4 mg via INTRAVENOUS
  Filled 2018-05-01 (×11): qty 2

## 2018-05-01 MED ORDER — DOCUSATE SODIUM 100 MG PO CAPS
100.0000 mg | ORAL_CAPSULE | Freq: Two times a day (BID) | ORAL | Status: DC | PRN
  Administered 2018-05-01: 100 mg via ORAL

## 2018-05-01 MED ORDER — SODIUM CHLORIDE 0.9 % IV SOLN
2.0000 g | Freq: Four times a day (QID) | INTRAVENOUS | Status: DC
  Administered 2018-05-01: 2 g via INTRAVENOUS
  Filled 2018-05-01: qty 2
  Filled 2018-05-01: qty 2000

## 2018-05-01 MED ORDER — FAMOTIDINE IN NACL 20-0.9 MG/50ML-% IV SOLN
20.0000 mg | Freq: Two times a day (BID) | INTRAVENOUS | Status: DC
  Administered 2018-05-01 – 2018-05-02 (×4): 20 mg via INTRAVENOUS
  Filled 2018-05-01 (×5): qty 50

## 2018-05-01 NOTE — H&P (Addendum)
First Provider Initiated Contact with Patient 04/30/18 2244    Katelyn Mcfarland is a 27 y.o. female presenting for persistent and recurrent nausea and vomiting.  This is her third MAU visit for this in a week. States has persistent vomiting despite taking meds, but does admit she last took meds last night. None today.  Father of baby is quite concerned and frustrated over persistence of this condition.  Worried that "the baby will kill her" and states if she does not get better tonight, they want to be referred for an abortion.  States she is unable to keep any food down and is only able to drink a fourth of a bottle of water per day.   RN Note: Was seen here yesterday for n/v and given IVFs and sent home with meds for nausea. Unable to keep down meds. And vomiting continues. No diarrhea. Pt unable to get urine specimen currently    OB History    Gravida  6   Para  2   Term  2   Preterm  0   AB  3   Living  2     SAB  2   TAB  1   Ectopic  0   Multiple  0   Live Births  2          Past Medical History:  Diagnosis Date  . Allergy   . Chlamydia    age 27yo; hx/o trichomonas age 27yo  . Chronic headache   . Depression    doing ok, declined meds  . High cholesterol   . Infection    UTI  . Vaginal Pap smear, abnormal    bx, cryo   Past Surgical History:  Procedure Laterality Date  . INDUCED ABORTION    . WISDOM TOOTH EXTRACTION     Family History: family history includes Asthma in her sister; Cancer in her maternal grandmother, paternal grandfather, and paternal grandmother; Diabetes in her maternal grandmother and mother; Hypertension in her mother; Kidney disease in her maternal grandmother. Social History:  reports that she has quit smoking. Her smoking use included cigarettes. She has a 4.00 pack-year smoking history. She has never used smokeless tobacco. She reports that she drinks alcohol. She reports that she does not use drugs.   Review of Systems   Constitutional: Positive for malaise/fatigue and weight loss. Negative for chills and fever.  Eyes: Negative for blurred vision.  Gastrointestinal: Positive for nausea and vomiting. Negative for abdominal pain, constipation and diarrhea.  Genitourinary: Negative for dysuria.  Musculoskeletal: Negative for back pain.  Neurological: Positive for headaches. Negative for dizziness and focal weakness.   Maternal Medical History:  Reason for admission: Nausea.   Prenatal complications: No bleeding, PIH or infection.   Prenatal Complications - Diabetes: none.      Blood pressure 129/78, pulse (!) 110, temperature 99 F (37.2 C), resp. rate 18, last menstrual period 02/07/2018, unknown if currently breastfeeding. Maternal Exam:  Abdomen: Patient reports no abdominal tenderness. Introitus: not evaluated.     Physical Exam  Constitutional: She is oriented to person, place, and time. She appears well-developed. No distress (appears to have malaise).  HENT:  Head: Normocephalic.  Neck: Neck supple.  Cardiovascular: Regular rhythm and normal heart sounds. Exam reveals no friction rub.  No murmur heard. Slightly tachycardic  Respiratory: Effort normal and breath sounds normal. No respiratory distress. She has no wheezes. She has no rales.  GI: Soft. She exhibits no distension. There is no  tenderness. There is no rebound.  Genitourinary:  Genitourinary Comments: Pelvic deferred  Musculoskeletal: Normal range of motion.  Neurological: She is alert and oriented to person, place, and time.  Skin: Skin is warm and dry.  Psychiatric: She has a normal mood and affect.    Prenatal labs: ABO, Rh: O/Positive/-- (07/16 1114) Antibody: Positive, See Final Results (07/16 1114) Rubella: 9.06 (07/16 1114) RPR: Non Reactive (07/16 1114)  HBsAg: Negative (07/16 1114)  HIV: Non Reactive (07/16 1114)  GBS:     Assessment/Plan: Single intrauterine pregnancy at  [redacted]w[redacted]d Hyperemesis Dehydration  Admit for hydration per Dr Alysia Penna, who did go in and see patient IV hydration Zofran, scheduled Pepcid Limited stimulation (dark room,no TV) per Dr Alysia Penna NPO Already given Decadron x 10mg  MD to follow   Wynelle Bourgeois 05/01/2018, 12:08 AM  Pt seen and examined. Agree with above management and POC.  Nettie Elm, MD

## 2018-05-01 NOTE — Progress Notes (Signed)
Patient ID: Katelyn Mcfarland, female   DOB: 1990-12-10, 27 y.o.   MRN: [id number] [facility] ANTEPARTUM COMPREHENSIVE PROGRESS NOTE  Katelyn Mcfarland is a 27 y.o. [id number] at 8454w6d  who is admitted for Hyperemesis.   Fetal presentation is unsure. Length of Stay:  0  Days  Subjective: Pt reports HA this morning. No emesis since admission. Nausea less as well.   Vitals:  Blood pressure 104/67, pulse 67, temperature 97.9 F (36.6 C), temperature source Oral, resp. rate 16, height 5' (1.524 m), weight 90.7 kg (200 lb), last menstrual period 02/07/2018, SpO2 100 %, unknown if currently breastfeeding.   Physical Examination: Lungs clear  Heart RRR Abd soft hypoactive BS Ext non tender   Fetal Monitoring:  N/A  Labs:  No results found for this or any previous visit (from the past 24 hour(s)).  Imaging Studies:    U/S today   Medications:  Scheduled . docusate sodium  100 mg Oral Daily  . ondansetron (ZOFRAN) IV  4 mg Intravenous Q6H  . prenatal multivitamin  1 tablet Oral Q1200   I have reviewed the patient's current medications.  ASSESSMENT: IUP 11 6/7 weeks Hyperemesis GBS UTI  PLAN: Stable. Continue NPO, IV fluids and antiemetic medications, Check U/S and TSH. Continue routine antenatal care.   Hermina StaggersMichael L Bronnie Vasseur 05/01/2018,7:17 AM

## 2018-05-01 NOTE — Progress Notes (Addendum)
AP note Felt better in the AM and a little worse now. Not really able to eat much of anything today (some ginger ale, part of an apple and attempted a bagel). Will add on scheduled IV reglan and qday vitamin B6 iv and continue the scheduled zofran, scope patch and IVF. Pt placed on full liquids from regular  Katelyn Mcfarland, Jr MD Attending Center for [facility] (Faculty Practice) 05/01/2018 Time: [phone]

## 2018-05-02 DIAGNOSIS — E669 Obesity, unspecified: Secondary | ICD-10-CM | POA: Diagnosis present

## 2018-05-02 DIAGNOSIS — O99210 Obesity complicating pregnancy, unspecified trimester: Secondary | ICD-10-CM

## 2018-05-02 DIAGNOSIS — Z87891 Personal history of nicotine dependence: Secondary | ICD-10-CM | POA: Diagnosis not present

## 2018-05-02 DIAGNOSIS — O219 Vomiting of pregnancy, unspecified: Secondary | ICD-10-CM

## 2018-05-02 DIAGNOSIS — Z3A11 11 weeks gestation of pregnancy: Secondary | ICD-10-CM | POA: Diagnosis not present

## 2018-05-02 DIAGNOSIS — O2341 Unspecified infection of urinary tract in pregnancy, first trimester: Secondary | ICD-10-CM | POA: Diagnosis present

## 2018-05-02 DIAGNOSIS — D573 Sickle-cell trait: Secondary | ICD-10-CM | POA: Diagnosis present

## 2018-05-02 DIAGNOSIS — O99211 Obesity complicating pregnancy, first trimester: Secondary | ICD-10-CM | POA: Diagnosis present

## 2018-05-02 DIAGNOSIS — O99011 Anemia complicating pregnancy, first trimester: Secondary | ICD-10-CM | POA: Diagnosis present

## 2018-05-02 DIAGNOSIS — O211 Hyperemesis gravidarum with metabolic disturbance: Secondary | ICD-10-CM | POA: Diagnosis present

## 2018-05-02 NOTE — Progress Notes (Signed)
Daily Antepartum Note  Admission Date: 04/30/2018 Current Date: 05/02/2018 7:22 AM  Katelyn Mcfarland is a 27 y.o. [id number] @ 5820w0d, HD#2, admitted for n/v of pregnancy.  Pregnancy complicated by: Patient Active Problem List   Diagnosis Date Noted  . Nausea and vomiting during pregnancy prior to [redacted] weeks gestation 05/01/2018  . Sickle cell trait (HCC) 05/01/2018  . BMI 35-39 05/01/2018  . Obesity in pregnancy 05/01/2018  . GBS bacteriuria 04/28/2018  . Supervision of normal pregnancy, antepartum 04/27/2018  . Current smoker 01/11/2014    Overnight/24hr events:  Was able to get some sleep last night  Subjective:  Feeling somewhat better. Hasn't had breakfast yet  Objective:    Current Vital Signs 24h Vital Sign Ranges  T 98.2 F (36.8 C) Temp  Avg: 98.2 F (36.8 C)  Min: 97.6 F (36.4 C)  Max: 98.7 F (37.1 C)  BP (!) 99/54 BP  Min: 92/63  Max: 123/75  HR 64 Pulse  Avg: 66  Min: 55  Max: 76  RR 16 Resp  Avg: 17  Min: 16  Max: 18  SaO2 99 % Room Air SpO2  Avg: 99.7 %  Min: 99 %  Max: 100 %       24 Hour I/O Current Shift I/O  Time Ins Outs 07/20 0701 - 07/21 0700 In: 552.1 [I.V.:452.1] Out: 2000 [Urine:1900] No intake/output data recorded.   Physical exam: General: Well nourished, well developed female in no acute distress. Abdomen: nttp Cardiovascular: S1, S2 normal, no murmur, rub or gallop, regular rate and rhythm Respiratory: CTAB Extremities: no clubbing, cyanosis or edema Skin: Warm and dry.   Medications: Current Facility-Administered Medications  Medication Dose Route Frequency Provider Last Rate Last Dose  . acetaminophen (TYLENOL) tablet 650 mg  650 mg Oral Q4H PRN Aviva SignsWilliams, Marie L, CNM   650 mg at 05/01/18 1130  . dextrose 5 % in lactated ringers infusion   Intravenous Continuous [place] BingPickens, Mikea Quadros, MD 50 mL/hr at 05/02/18 0541    . docusate sodium (COLACE) capsule 100 mg  100 mg Oral BID PRN [place] BingPickens, Sahmya Arai, MD   100 mg at 05/01/18 1247  . famotidine  (PEPCID) IVPB 20 mg premix  20 mg Intravenous Q12H Aviva SignsWilliams, Marie L, CNM   Stopped at 05/01/18 2342  . metoCLOPramide (REGLAN) injection 10 mg  10 mg Intravenous Q6H [place] BingPickens, Betti Goodenow, MD   10 mg at 05/02/18 0154  . ondansetron (ZOFRAN) injection 4 mg  4 mg Intravenous Q6H Aviva SignsWilliams, Marie L, CNM   4 mg at 05/02/18 0529  . pyridOXINE (B-6) injection 50 mg  50 mg Intravenous Q24H [place] BingPickens, Mical Kicklighter, MD   50 mg at 05/01/18 2009  . scopolamine (TRANSDERM-SCOP) 1 MG/3DAYS 1.5 mg  1 patch Transdermal Q72H Hermina StaggersErvin, Michael L, MD   1.5 mg at 05/01/18 [id number]    Labs:  Recent Labs  Lab 04/27/18 1114 04/29/18 1032  WBC 8.6 8.6  HGB 12.0 12.8  HCT 36.1 36.9  PLT 223 228    Recent Labs  Lab 04/29/18 1032  NA 135  K 3.8  CL 102  CO2 20*  BUN 7  CREATININE 0.74  CALCIUM 9.3  PROT 7.7  BILITOT 1.1  ALKPHOS 46  ALT 19  AST 28  GLUCOSE 79     Radiology: no new imaging  Assessment & Plan:  Pt stable *Pregnancy: routine care *PPx: SCDs, OOB ad lib *FEN/GI: on full liquid now and MIVF. Continue current regimen and adat *Dispo: when able to tolerate more  PO  Cornelia Copa MD Attending Center for [facility] [place])

## 2018-05-03 MED ORDER — SODIUM CHLORIDE 0.9% FLUSH: 3.0000 mL | INTRAVENOUS | Status: DC | PRN

## 2018-05-03 MED ORDER — ONDANSETRON 4 MG PO TBDP: 4.0000 mg | ORAL_TABLET | Freq: Three times a day (TID) | ORAL | 0 refills | Status: DC

## 2018-05-03 MED ORDER — FAMOTIDINE 20 MG PO TABS: 20.0000 mg | ORAL_TABLET | Freq: Two times a day (BID) | ORAL | 3 refills | Status: DC

## 2018-05-03 MED ORDER — PYRIDOXINE HCL 25 MG PO TABS: 25.0000 mg | ORAL_TABLET | Freq: Two times a day (BID) | ORAL | 1 refills | Status: DC

## 2018-05-03 MED ORDER — METOCLOPRAMIDE HCL 10 MG PO TABS
10.0000 mg | ORAL_TABLET | Freq: Four times a day (QID) | ORAL | Status: DC
  Administered 2018-05-03 (×2): 10 mg via ORAL
  Filled 2018-05-03 (×2): qty 1

## 2018-05-03 MED ORDER — PYRIDOXINE HCL 25 MG PO TABS
25.0000 mg | ORAL_TABLET | Freq: Two times a day (BID) | ORAL | Status: DC
  Administered 2018-05-03: 25 mg via ORAL
  Filled 2018-05-03 (×3): qty 1

## 2018-05-03 MED ORDER — SODIUM CHLORIDE 0.9% FLUSH
3.0000 mL | Freq: Two times a day (BID) | INTRAVENOUS | Status: DC
  Administered 2018-05-03: 3 mL via INTRAVENOUS

## 2018-05-03 MED ORDER — METOCLOPRAMIDE HCL 10 MG PO TABS: 10.0000 mg | ORAL_TABLET | Freq: Four times a day (QID) | ORAL | 2 refills | Status: DC

## 2018-05-03 MED ORDER — ONDANSETRON 4 MG PO TBDP
4.0000 mg | ORAL_TABLET | Freq: Three times a day (TID) | ORAL | Status: DC
  Administered 2018-05-03: 4 mg via ORAL
  Filled 2018-05-03 (×4): qty 1

## 2018-05-03 MED ORDER — FAMOTIDINE 20 MG PO TABS
20.0000 mg | ORAL_TABLET | Freq: Two times a day (BID) | ORAL | Status: DC
  Administered 2018-05-03: 20 mg via ORAL
  Filled 2018-05-03: qty 1

## 2018-05-03 NOTE — Progress Notes (Signed)
Daily Antepartum Note  Admission Date: 04/30/2018 Current Date: 05/03/2018 10:19 AM  Katelyn Mcfarland is a 27 y.o. [id number] @ [redacted]w[redacted]d, HD#3, admitted for n/v of pregnancy.  Pregnancy complicated by: Patient Active Problem List   Diagnosis Date Noted  . Hyperemesis affecting pregnancy, antepartum 05/02/2018  . Nausea and vomiting during pregnancy prior to [redacted] weeks gestation 05/01/2018  . Sickle cell trait (HCC) 05/01/2018  . BMI 35-39 05/01/2018  . Obesity in pregnancy 05/01/2018  . GBS bacteriuria 04/28/2018  . Supervision of normal pregnancy, antepartum 04/27/2018  . Current smoker 01/11/2014    Overnight/24hr events:  none  Subjective:  Patient states she is feeling much better. Able to take breakfast fine today.   Objective:    Current Vital Signs 24h Vital Sign Ranges  T 98.7 F (37.1 C) Temp  Avg: 98.7 F (37.1 C)  Min: 97.8 F (36.6 C)  Max: 99.2 F (37.3 C)  BP 100/66 BP  Min: 95/50  Max: 107/64  HR 65 Pulse  Avg: 71.3  Min: 57  Max: 84  RR 18 Resp  Avg: 18.2  Min: 18  Max: 19  SaO2 100 % Room Air SpO2  Avg: 99.3 %  Min: 96 %  Max: 100 %       24 Hour I/O Current Shift I/O  Time Ins Outs 07/21 0701 - 07/22 0700 In: 250.8 [I.V.:250.8] Out: 1300 [Urine:1300] No intake/output data recorded.   Physical exam: General: Well nourished, well developed female in no acute distress. Abdomen: nttp Cardiovascular: S1, S2 normal, no murmur, rub or gallop, regular rate and rhythm Respiratory: CTAB Extremities: no clubbing, cyanosis or edema Skin: Warm and dry.   Medications: Current Facility-Administered Medications  Medication Dose Route Frequency Provider Last Rate Last Dose  . acetaminophen (TYLENOL) tablet 650 mg  650 mg Oral Q4H PRN Aviva Signs, CNM   650 mg at 05/01/18 1130  . docusate sodium (COLACE) capsule 100 mg  100 mg Oral BID PRN [place] Bing, MD   100 mg at 05/01/18 1247  . famotidine (PEPCID) tablet 20 mg  20 mg Oral BID [place] Bing, MD       . metoCLOPramide (REGLAN) tablet 10 mg  10 mg Oral Q6H [place] Bing, MD      . ondansetron (ZOFRAN-ODT) disintegrating tablet 4 mg  4 mg Oral Q8H Fermina Mishkin, Billey Gosling, MD      . pyridOXINE (VITAMIN B-6) tablet 25 mg  25 mg Oral BID [place] Bing, MD      . scopolamine (TRANSDERM-SCOP) 1 MG/3DAYS 1.5 mg  1 patch Transdermal Q72H Hermina Staggers, MD   1.5 mg at 05/01/18 0942  . sodium chloride flush (NS) 0.9 % injection 3 mL  3 mL Intravenous Q12H [place] Bing, MD   3 mL at 05/03/18 0819  . sodium chloride flush (NS) 0.9 % injection 3 mL  3 mL Intravenous PRN  Bing, MD        Labs:  Recent Labs  Lab 04/27/18 1114 04/29/18 1032  WBC 8.6 8.6  HGB 12.0 12.8  HCT 36.1 36.9  PLT 223 228    Recent Labs  Lab 04/29/18 1032  NA 135  K 3.8  CL 102  CO2 20*  BUN 7  CREATININE 0.74  CALCIUM 9.3  PROT 7.7  BILITOT 1.1  ALKPHOS 46  ALT 19  AST 28  GLUCOSE 79     Radiology: no new imaging  Assessment & Plan:  Pt stable *Pregnancy: routine care *PPx: SCDs, OOB  ad lib *FEN/GI: SLIV. Placed on all PO meds. Likely home later this afternoon *Dispo: see above.   Cornelia Copaharlie Emali Heyward, Jr. MD Attending Center for [facility](Faculty Practice)

## 2018-05-03 NOTE — Discharge Summary (Signed)
Discharge Summary   Admit Date: 04/30/2018 Discharge Date: 05/03/2018 Discharging Service: Antepartum  Primary OBGYN: [facility] Admitting Physician: Hermina StaggersMichael L Ervin, MD  Discharge Physician: Vergie LivingPickens  Referring Provider: MAU  Primary Care Provider: Patient, No Pcp Per  Admission Diagnoses: *Pregnancy at 11/5 weeks *Nausea, vomiting of pregnancy *Failed outpatient management *UTI  Discharge Diagnoses: *Pregnancy at 12/1 weeks * Improved nausea, vomiting of pregnancy *UTI treated with IV antibiotics  Consult Orders: None   Surgeries/Procedures Performed: None  History and Physical: First Provider Initiated Contact with Patient 04/30/18 2244   Ward Chatterseresa M Wojciak is a 27 y.o. female presenting for persistent and recurrent nausea and vomiting.  This is her third MAU visit for this in a week. States has persistent vomiting despite taking meds, but does admit she last took meds last night. None today.  Father of baby is quite concerned and frustrated over persistence of this condition.  Worried that "the baby will kill her" and states if she does not get better tonight, they want to be referred for an abortion.  States she is unable to keep any food down and is only able to drink a fourth of a bottle of water per day.   RN Note: Was seen here yesterday for n/v and given IVFs and sent home with meds for nausea. Unable to keep down meds. And vomiting continues. No diarrhea. Pt unable to get urine specimen currently            OB History    Gravida  6   Para  2   Term  2   Preterm  0   AB  3   Living  2     SAB  2   TAB  1   Ectopic  0   Multiple  0   Live Births  2              Past Medical History:  Diagnosis Date  . Allergy   . Chlamydia    age 27yo; hx/o trichomonas age 27yo  . Chronic headache   . Depression    doing ok, declined meds  . High cholesterol   . Infection    UTI  . Vaginal Pap smear, abnormal    bx, cryo         Past Surgical History:  Procedure Laterality Date  . INDUCED ABORTION    . WISDOM TOOTH EXTRACTION     Family History: family history includes Asthma in her sister; Cancer in her maternal grandmother, paternal grandfather, and paternal grandmother; Diabetes in her maternal grandmother and mother; Hypertension in her mother; Kidney disease in her maternal grandmother. Social History:  reports that she has quit smoking. Her smoking use included cigarettes. She has a 4.00 pack-year smoking history. She has never used smokeless tobacco. She reports that she drinks alcohol. She reports that she does not use drugs.   Review of Systems  Constitutional: Positive for malaise/fatigue and weight loss. Negative for chills and fever.  Eyes: Negative for blurred vision.  Gastrointestinal: Positive for nausea and vomiting. Negative for abdominal pain, constipation and diarrhea.  Genitourinary: Negative for dysuria.  Musculoskeletal: Negative for back pain.  Neurological: Positive for headaches. Negative for dizziness and focal weakness.   Maternal Medical History:  Reason for admission: Nausea.   Prenatal complications: No bleeding, PIH or infection.   Prenatal Complications - Diabetes: none.    Blood pressure 129/78, pulse (!) 110, temperature 99 F (37.2 C), resp. rate 18, last menstrual period  02/07/2018, unknown if currently breastfeeding. Maternal Exam:  Abdomen: Patient reports no abdominal tenderness. Introitus: not evaluated.     Physical Exam  Constitutional: She is oriented to person, place, and time. She appears well-developed. No distress (appears to have malaise).  HENT:  Head: Normocephalic.  Neck: Neck supple.  Cardiovascular: Regular rhythm and normal heart sounds. Exam reveals no friction rub.  No murmur heard. Slightly tachycardic  Respiratory: Effort normal and breath sounds normal. No respiratory distress. She has no wheezes. She has no rales.  GI: Soft. She  exhibits no distension. There is no tenderness. There is no rebound.  Genitourinary:  Genitourinary Comments: Pelvic deferred  Musculoskeletal: Normal range of motion.  Neurological: She is alert and oriented to person, place, and time.  Skin: Skin is warm and dry.  Psychiatric: She has a normal mood and affect.    Prenatal labs: ABO, Rh: O/Positive/-- (07/16 1114) Antibody: Positive, See Final Results (07/16 1114) Rubella: 9.06 (07/16 1114) RPR: Non Reactive (07/16 1114)  HBsAg: Negative (07/16 1114)  HIV: Non Reactive (07/16 1114)  GBS:     Assessment/Plan: Single intrauterine pregnancy at [redacted]w[redacted]d Hyperemesis Dehydration  Admit for hydration per Dr Alysia Penna, who did go in and see patient IV hydration Zofran, scheduled Pepcid Limited stimulation (dark room,no TV) per Dr Alysia Penna NPO Already given Decadron x 10mg  MD to follow      Wynelle Bourgeois 05/01/2018, 12:08 AM  Pt seen and examined. Agree with above management and POC.  Nettie Elm, MD       Electronically signed by Aviva Signs, CNM at 05/01/2018 12:26 AM Electronically signed by Hermina Staggers, MD at 05/01/2018 2:06 AM Electronically signed by Hermina Staggers, MD at 05/01/2018 2:06 AM    Hospital Course: Patient had an uncomplicated course and was able to tolerate a bland diet after transitioning to PO meds. Her UTI was treated with IV antibiotics given her difficulty with PO on admission.   Discharge Exam:    Current Vital Signs 24h Vital Sign Ranges  T 98.8 F (37.1 C) Temp  Avg: 98.9 F (37.2 C)  Min: 98.6 F (37 C)  Max: 99.2 F (37.3 C)  BP (!) 106/58 BP  Min: 95/50  Max: 109/59  HR 70 Pulse  Avg: 69.9  Min: 57  Max: 84  RR 16 Resp  Avg: 17.6  Min: 16  Max: 19  SaO2 100 % Room Air SpO2  Avg: 99.1 %  Min: 96 %  Max: 100 %       24 Hour I/O Current Shift I/O  Time Ins Outs 07/21 0701 - 07/22 0700 In: 250.8 [I.V.:250.8] Out: 1300 [Urine:1300] 07/22 0701 - 07/22 1900 In: -  Out: 2050  [Urine:2050]    General appearance: Well nourished, well developed female in no acute distress.  Cardiovascular: S1, S2 normal, no murmur, rub or gallop, regular rate and rhythm Respiratory:  Clear to auscultation bilateral. Normal respiratory effort Abdomen: positive bowel sounds and no masses, hernias; diffusely non tender to palpation, non distended Neuro/Psych:  Normal mood and affect.  Skin:  Warm and dry.   Bedside u/s: appropriately growth fetus, normal FHR, subj normal AF, +FM  Discharge Disposition:  Home  Patient Instructions:  Standard   Results Pending at Discharge:  None  Discharge Medications: Allergies as of 05/03/2018   No Known Allergies     Medication List    STOP taking these medications   amoxicillin-clavulanate 875-125 MG tablet Commonly known as:  AUGMENTIN  FISH OIL PO   metroNIDAZOLE 500 MG tablet Commonly known as:  FLAGYL   ranitidine 150 MG capsule Commonly known as:  ZANTAC   VITAMIN C PO   VITAMIN E PO     TAKE these medications   acetaminophen 500 MG tablet Commonly known as:  TYLENOL Take 500 mg by mouth every 6 (six) hours as needed for mild pain.   COMFORT FIT MATERNITY SUPP SM Misc Wear as directed.   famotidine 20 MG tablet Commonly known as:  PEPCID Take 1 tablet (20 mg total) by mouth 2 (two) times daily.   metoCLOPramide 10 MG tablet Commonly known as:  REGLAN Take 1 tablet (10 mg total) by mouth every 6 (six) hours for 14 days. After 14 days, take every six hours as needed. What changed:    when to take this  reasons to take this  additional instructions   ondansetron 4 MG disintegrating tablet Commonly known as:  ZOFRAN-ODT Take 1 tablet (4 mg total) by mouth every 8 (eight) hours. Take q8h x 14 days and then do q8h prn after that   pyridOXINE 25 MG tablet Commonly known as:  VITAMIN B-6 Take 1 tablet (25 mg total) by mouth 2 (two) times daily.   scopolamine 1 MG/3DAYS Commonly known as:   TRANSDERM-SCOP Place 1 patch (1.5 mg total) onto the skin every 3 (three) days.   terconazole 0.8 % vaginal cream Commonly known as:  TERAZOL 3 Place 1 applicator vaginally at bedtime.   VITAFOL ULTRA 29-0.6-0.4-200 MG Caps Take 1 tablet by mouth daily before breakfast.        Future Appointments  Date Time Provider Department Center  05/25/2018  9:00 AM Brock Bad, MD [facility] None    Cornelia Copa MD Attending Center for [facility])

## 2018-05-03 NOTE — Discharge Instructions (Signed)
Nausea and vomiting of pregnanc Hyperemesis gravidarum is a severe form of nausea and vomiting that happens during pregnancy. Hyperemesis is worse than morning sickness. It may cause you to have nausea or vomiting all day for many days. It may keep you from eating and drinking enough food and liquids. Hyperemesis usually occurs during the first half (the first 20 weeks) of pregnancy. It often goes away once a woman is in her second half of pregnancy. However, sometimes hyperemesis continues through an entire pregnancy. What are the causes? The cause of this condition is not known. It may be related to changes in chemicals (hormones) in the body during pregnancy, such as the high level of pregnancy hormone (human chorionic gonadotropin) or the increase in the female sex hormone (estrogen). What are the signs or symptoms? Symptoms of this condition include:  Severe nausea and vomiting.  Nausea that does not go away.  Vomiting that does not allow you to keep any food down.  Weight loss.  Body fluid loss (dehydration).  Having no desire to eat, or not liking food that you have previously enjoyed.  How is this diagnosed? This condition may be diagnosed based on:  A physical exam.  Your medical history.  Your symptoms.  Blood tests.  Urine tests.  How is this treated? This condition may be managed with medicine. If medicines to do not help relieve nausea and vomiting, you may need to receive fluids through an IV tube at the hospital. Follow these instructions at home:  Take over-the-counter and prescription medicines only as told by your health care provider.  Avoid iron pills and multivitamins that contain iron for the first 3-4 months of pregnancy. If you take prescription iron pills, do not stop taking them unless your health care provider approves.  Take the following actions to help prevent nausea and vomiting: ? In the morning, before getting out of bed, try eating a couple  of dry crackers or a piece of toast. ? Avoid foods and smells that upset your stomach. Fatty and spicy foods may make nausea worse. ? Eat 5-6 small meals a day. ? Do not drink fluids while eating meals. Drink between meals. ? Eat or suck on things that have ginger in them. Ginger can help relieve nausea. ? Avoid food preparation. The smell of food can spoil your appetite or trigger nausea.  Follow instructions from your health care provider about eating or drinking restrictions.  For snacks, eat high-protein foods, such as cheese.  Keep all follow-up and pre-birth (prenatal) visits as told by your health care provider. This is important. Contact a health care provider if:  You have pain in your abdomen.  You have a severe headache.  You have vision problems.  You are losing weight. Get help right away if:  You cannot drink fluids without vomiting.  You vomit blood.  You have constant nausea and vomiting.  You are very weak.  You are very thirsty.  You feel dizzy.  You faint.  You have a fever or other symptoms that last for more than 2-3 days.  You have a fever and your symptoms suddenly get worse. Summary  Hyperemesis gravidarum is a severe form of nausea and vomiting that happens during pregnancy.  Making some changes to your eating habits may help relieve nausea and vomiting.  This condition may be managed with medicine.  If medicines to do not help relieve nausea and vomiting, you may need to receive fluids through an IV tube at  the hospital. This information is not intended to replace advice given to you by your health care provider. Make sure you discuss any questions you have with your health care provider. Document Released: 09/29/2005 Document Revised: 05/28/2016 Document Reviewed: 05/28/2016 Elsevier Interactive Patient Education  2017 [employer]

## 2018-05-03 NOTE — Progress Notes (Signed)
Pt discharged in good condition ambulatory

## 2018-05-09 ENCOUNTER — Other Ambulatory Visit: Payer: Self-pay

## 2018-05-09 ENCOUNTER — Encounter (HOSPITAL_COMMUNITY): Payer: Self-pay

## 2018-05-09 ENCOUNTER — Inpatient Hospital Stay (HOSPITAL_COMMUNITY)
Admission: AD | Admit: 2018-05-09 | Discharge: 2018-05-09 | Disposition: A | Discharge status: Home / Self Care | Payer: Medicaid Other | Source: Ambulatory Visit | Attending: Obstetrics & Gynecology | Admitting: Obstetrics & Gynecology

## 2018-05-09 DIAGNOSIS — Z3A13 13 weeks gestation of pregnancy: Secondary | ICD-10-CM | POA: Insufficient documentation

## 2018-05-09 DIAGNOSIS — O26891 Other specified pregnancy related conditions, first trimester: Secondary | ICD-10-CM | POA: Diagnosis not present

## 2018-05-09 DIAGNOSIS — Z79899 Other long term (current) drug therapy: Secondary | ICD-10-CM | POA: Insufficient documentation

## 2018-05-09 DIAGNOSIS — O99341 Other mental disorders complicating pregnancy, first trimester: Secondary | ICD-10-CM | POA: Diagnosis not present

## 2018-05-09 DIAGNOSIS — K5900 Constipation, unspecified: Secondary | ICD-10-CM | POA: Diagnosis present

## 2018-05-09 DIAGNOSIS — F3289 Other specified depressive episodes: Secondary | ICD-10-CM | POA: Diagnosis not present

## 2018-05-09 DIAGNOSIS — R1110 Vomiting, unspecified: Secondary | ICD-10-CM | POA: Insufficient documentation

## 2018-05-09 DIAGNOSIS — Z87891 Personal history of nicotine dependence: Secondary | ICD-10-CM | POA: Diagnosis not present

## 2018-05-09 DIAGNOSIS — F329 Major depressive disorder, single episode, unspecified: Secondary | ICD-10-CM | POA: Diagnosis not present

## 2018-05-09 DIAGNOSIS — Z8249 Family history of ischemic heart disease and other diseases of the circulatory system: Secondary | ICD-10-CM | POA: Diagnosis not present

## 2018-05-09 LAB — BASIC METABOLIC PANEL
Anion gap: 12 (ref 5–15)
BUN: 5 mg/dL — ABNORMAL LOW (ref 6–20)
CO2: 17 mmol/L — ABNORMAL LOW (ref 22–32)
Calcium: 9 mg/dL (ref 8.9–10.3)
Chloride: 104 mmol/L (ref 98–111)
Creatinine, Ser: 0.65 mg/dL (ref 0.44–1.00)
GFR calc Af Amer: >60 mL/min (ref >60)
GFR calc non Af Amer: >60 mL/min (ref >60)
Glucose, Bld: 80 mg/dL (ref 70–99)
Potassium: 3.5 mmol/L (ref 3.5–5.1)
Sodium: 133 mmol/L — ABNORMAL LOW (ref 135–145)

## 2018-05-09 LAB — CBC
HCT: 36.7 % (ref 36.0–46.0)
Hemoglobin: 12.5 g/dL (ref 12.0–15.0)
MCH: 29.8 pg (ref 26.0–34.0)
MCHC: 34.1 g/dL (ref 30.0–36.0)
MCV: 87.4 fL (ref 78.0–100.0)
Platelets: 220 10*3/uL (ref 150–400)
RBC: 4.2 MIL/uL (ref 3.87–5.11)
RDW: 13.5 % (ref 11.5–15.5)
WBC: 10 10*3/uL (ref 4.0–10.5)

## 2018-05-09 LAB — URINALYSIS, ROUTINE W REFLEX MICROSCOPIC
Bilirubin Urine: NEGATIVE
Glucose, UA: NEGATIVE mg/dL
Hgb urine dipstick: NEGATIVE
Ketones, ur: 80 mg/dL — AB
Leukocytes, UA: NEGATIVE
Nitrite: NEGATIVE
Protein, ur: NEGATIVE mg/dL
Specific Gravity, Urine: 1.014 (ref 1.005–1.030)
pH: 6 (ref 5.0–8.0)

## 2018-05-09 MED ORDER — LACTATED RINGERS IV BOLUS
1000.0000 mL | Freq: Once | INTRAVENOUS | Status: AC
  Administered 2018-05-09: 1000 mL via INTRAVENOUS

## 2018-05-09 MED ORDER — SERTRALINE HCL 50 MG PO TABS: 50.0000 mg | ORAL_TABLET | Freq: Every day | ORAL | 3 refills | Status: DC

## 2018-05-09 MED ORDER — POLYETHYLENE GLYCOL 3350 17 G PO PACK: 17.0000 g | PACK | Freq: Every day | ORAL | 0 refills | Status: DC

## 2018-05-09 MED ORDER — FAMOTIDINE IN NACL 20-0.9 MG/50ML-% IV SOLN
20.0000 mg | Freq: Once | INTRAVENOUS | Status: AC
  Administered 2018-05-09: 20 mg via INTRAVENOUS
  Filled 2018-05-09: qty 50

## 2018-05-09 MED ORDER — DOCUSATE SODIUM 100 MG PO CAPS: 100.0000 mg | ORAL_CAPSULE | Freq: Two times a day (BID) | ORAL | 0 refills | Status: DC

## 2018-05-09 MED ORDER — SCOPOLAMINE 1 MG/3DAYS TD PT72
1.0000 | MEDICATED_PATCH | TRANSDERMAL | Status: DC
  Administered 2018-05-09: 1.5 mg via TRANSDERMAL
  Filled 2018-05-09: qty 1

## 2018-05-09 MED ORDER — PROMETHAZINE HCL 25 MG/ML IJ SOLN
25.0000 mg | Freq: Once | INTRAMUSCULAR | Status: AC
  Administered 2018-05-09: 25 mg via INTRAVENOUS
  Filled 2018-05-09: qty 1

## 2018-05-09 NOTE — MAU Note (Signed)
Last bowel movement was 07/27  Decreased appetite, anytime she eats/drinks anything she vomits  Had some bloody stool around an hour ago  Has not tried any stool softeners or enema at home  Has been taking zofran at home for constipation

## 2018-05-09 NOTE — MAU Note (Signed)
Pt given soap suds enema. States that it is working

## 2018-05-09 NOTE — MAU Provider Note (Addendum)
Chief Complaint: Constipation and Emesis   First Provider Initiated Contact with Patient 05/09/18 1614     SUBJECTIVE HPI: Katelyn Mcfarland is a 27 y.o. [id number] at [id number] who presents to Maternity Admissions reporting n/v & constipation. Patient admitted last week for n/v. States her symptoms improved throughout the week and she was able to tolerate food until the weekend. Had to leave her mom's house d/t a disagreement and left all of her medications there so hasn't had her nausea medication except for zofran. Took zofran this morning but reports that she still has continued to vomit countless times today. Was able to keep down Dr. Reino KentPepper prior to coming to MAU and last vomited over an hour ago.  Her last BM was a week ago. Has not been taking anything to treat her constipation. Feels like she needs to have a BM. Sat on toilet this afternoon and strained without relief. Noticed bright red blood on toilet paper after this episode.   Patient reports stressful at home situation. Has hx of "psych" issues that she used to see a counselor but hasn't seen one in years. Reports hx of SI. More recently she has felt like harming herself on a daily basis. Does not have an action plan but still feels like she wants to die. Has difficult living situation, goes between her mom, best friend, god mother, and staying at a hotel with her boyfriend.   Location: abdomen, upper and lower Quality: upper burning, lower fullness Severity: 7/10 on pain scale Duration: 2 days Timing: constant Modifying factors: none Associated signs and symptoms: constipation, n/v  Past Medical History:  Diagnosis Date  . Allergy   . Chlamydia    age 27yo; hx/o trichomonas age 27yo  . Chronic headache   . Depression    doing ok, declined meds  . High cholesterol   . Infection    UTI  . Vaginal Pap smear, abnormal    bx, cryo   OB History  Gravida Para Term Preterm AB Living  6 2 2  0 3 2  SAB TAB Ectopic Multiple Live Births   2 1 0 0 2    # Outcome Date GA Lbr Len/2nd Weight Sex Delivery Anes PTL Lv  6 Current           5 TAB           4 SAB           3 Term     F Vag-Spont  N LIV  2 Term     F Vag-Spont  N LIV  1 SAB            Past Surgical History:  Procedure Laterality Date  . INDUCED ABORTION    . WISDOM TOOTH EXTRACTION     Social History   Socioeconomic History  . Marital status: Single    Spouse name: Not on file  . Number of children: Not on file  . Years of education: Not on file  . Highest education level: Not on file  Occupational History  . Occupation: student [employer]     Employer: [employer]  Social Needs  . Financial resource strain: Not on file  . Food insecurity:    Worry: Not on file    Inability: Not on file  . Transportation needs:    Medical: Not on file    Non-medical: Not on file  Tobacco Use  . Smoking status: Former Smoker    Packs/day: 0.25  Years: 16.00    Pack years: 4.00    Types: Cigarettes  . Smokeless tobacco: Never Used  . Tobacco comment: quit x 3 wk as of 03/2012  Substance and Sexual Activity  . Alcohol use: Yes    Comment: not currently  . Drug use: No    Types: Marijuana    Comment: last Mar 2018  . Sexual activity: Not Currently  Lifestyle  . Physical activity:    Days per week: Not on file    Minutes per session: Not on file  . Stress: Not on file  Relationships  . Social connections:    Talks on phone: Not on file    Gets together: Not on file    Attends religious service: Not on file    Active member of club or organization: Not on file    Attends meetings of clubs or organizations: Not on file    Relationship status: Not on file  . Intimate partner violence:    Fear of current or ex partner: Not on file    Emotionally abused: Not on file    Physically abused: Not on file    Forced sexual activity: Not on file  Other Topics Concern  . Not on file  Social History Narrative   Single, 2 children, exercise - none.  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 Hypertension Mother   . Diabetes Mother   . Asthma Sister   . Cancer Paternal Grandmother   . Cancer Paternal Grandfather   . Cancer Maternal Grandmother   . Kidney disease Maternal Grandmother   . Diabetes Maternal Grandmother   . Heart disease Neg Hx   . Stroke Neg Hx    No current facility-administered medications on file prior to encounter.    Current Outpatient Medications on File Prior to Encounter  Medication Sig Dispense Refill  . acetaminophen (TYLENOL) 500 MG tablet Take 500 mg by mouth every 6 (six) hours as needed for mild pain.    [employer]ae [employer] Bandages & Supports (COMFORT FIT MATERNITY SUPP SM) MISC Wear as directed. 1 each 0  . famotidine (PEPCID) 20 MG tablet Take 1 tablet (20 mg total) by mouth 2 (two) times daily. 60 tablet 3  . metoCLOPramide (REGLAN) 10 MG tablet Take 1 tablet (10 mg total) by mouth every 6 (six) hours for 14 days. After 14 days, take every six hours as needed. 30 tablet 2  . ondansetron (ZOFRAN-ODT) 4 MG disintegrating tablet Take 1 tablet (4 mg total) by mouth every 8 (eight) hours. Take q8h x 14 days and then do q8h prn after that 40 tablet 0  . Prenat-Fe Poly-Methfol-FA-DHA (VITAFOL ULTRA) 29-0.6-0.4-200 MG CAPS Take 1 tablet by mouth daily before breakfast. 90 capsule 4  . pyridOXINE (VITAMIN B-6) 25 MG tablet Take 1 tablet (25 mg total) by mouth 2 (two) times daily. 60 tablet 1  . scopolamine (TRANSDERM-SCOP) 1 MG/3DAYS Place 1 patch (1.5 mg total) onto the skin every 3 (three) days. 10 patch 1  . terconazole (TERAZOL 3) 0.8 % vaginal cream Place 1 applicator vaginally at bedtime. 20 g 0   No Known Allergies  I have reviewed patient's Past Medical Hx, Surgical Hx, Family Hx, Social Hx, medications and allergies.   Review of Systems  Constitutional: Negative for chills and fever.  Gastrointestinal: Positive for abdominal distention, abdominal  pain, anal bleeding, constipation, nausea and vomiting. Negative for  blood in stool, diarrhea and rectal pain.  Genitourinary: Negative.   Psychiatric/Behavioral: Positive for suicidal ideas. Negative for self-injury.    OBJECTIVE Patient Vitals for the past 24 hrs:  BP Temp Temp src Pulse Resp SpO2 Height  05/09/18 1600 (!) 109/58 98.9 F (37.2 C) Oral 83 18 98 % 5' (1.524 m)   Constitutional: Well-developed, well-nourished female in no acute distress.  Cardiovascular: normal rate & rhythm, no murmur Respiratory: normal rate and effort. Lung sounds clear throughout GI: abdomen distended, but soft & non tender. Hypoactive BS in lower quadrants. No guarding or rebound tenderness MS: Extremities nontender, no edema, normal ROM Neurologic: Alert and oriented x 4.  GU: rectal exam; no blood, small flesh colored external hemorrhoid   LAB RESULTS Results for orders placed or performed during the hospital encounter of 05/09/18 (from the past 24 hour(s))  CBC     Status: None   Collection Time: 05/09/18  6:12 PM  Result Value Ref Range   WBC 10.0 4.0 - 10.5 K/uL   RBC 4.20 3.87 - 5.11 MIL/uL   Hemoglobin 12.5 12.0 - 15.0 g/dL   HCT 16.1 09.6 - 04.5 %   MCV 87.4 78.0 - 100.0 fL   MCH 29.8 26.0 - 34.0 pg   MCHC 34.1 30.0 - 36.0 g/dL   RDW 40.9 81.1 - 91.4 %   Platelets 220 150 - 400 K/uL  Basic metabolic panel     Status: Abnormal   Collection Time: 05/09/18  6:12 PM  Result Value Ref Range   Sodium 133 (L) 135 - 145 mmol/L   Potassium 3.5 3.5 - 5.1 mmol/L   Chloride 104 98 - 111 mmol/L   CO2 17 (L) 22 - 32 mmol/L   Glucose, Bld 80 70 - 99 mg/dL   BUN 5 (L) 6 - 20 mg/dL   Creatinine, Ser 7.82 0.44 - 1.00 mg/dL   Calcium 9.0 8.9 - 95.6 mg/dL   GFR calc non Af Amer >60 >60 mL/min   GFR calc Af Amer >60 >60 mL/min   Anion gap 12 5 - 15  Urinalysis, Routine w reflex microscopic     Status: Abnormal   Collection Time: 05/09/18  7:20 PM  Result Value Ref Range   Color, Urine YELLOW  YELLOW   APPearance HAZY (A) CLEAR   Specific Gravity, Urine 1.014 1.005 - 1.030   pH 6.0 5.0 - 8.0   Glucose, UA NEGATIVE NEGATIVE mg/dL   Hgb urine dipstick NEGATIVE NEGATIVE   Bilirubin Urine NEGATIVE NEGATIVE   Ketones, ur 80 (A) NEGATIVE mg/dL   Protein, ur NEGATIVE NEGATIVE mg/dL   Nitrite NEGATIVE NEGATIVE   Leukocytes, UA NEGATIVE NEGATIVE    IMAGING No results found.  MAU COURSE Orders Placed This Encounter  Procedures  . Urinalysis, Routine w reflex microscopic  . CBC  . Basic metabolic panel  . Soap suds enema  . Consult to TTS   Meds ordered this encounter  Medications  . lactated ringers bolus 1,000 mL  . famotidine (PEPCID) IVPB 20 mg premix  . promethazine (PHENERGAN) injection 25 mg  . scopolamine (TRANSDERM-SCOP) 1 MG/3DAYS 1.5 mg    MDM FHT 153 by doppler Scop patch applied IV fluids & phenergan telepsych consult ordered d/t SI Pt has SO at bedside Soap suds enema ordered--- good results Awaiting plan from behavioral health  Care turned over to [facility] CNM   Judeth Horn, NP 05/09/2018 8:17 PM    Assumed care of patient at 2000.  Report from Telepsych has recommendation for inpatient care, but not IVC. House coverage Verdon Cummins discussed with Behavioral Health house coverage that inpatient is recommendation, not IVC. Patient, RN and I had long discussion with partner about her mental state. 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 She has a history of depression but states that "every day she fights through it". She says she has 2 kids to live for and she would never leave them. 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 She agrees to start Zoloft; she was given numbers for two walk-in mental health centers. At this time, patient's VC does not seem  appropriate as patient is very worried about her kids and how inpatient could affect her house closing for later this week. Patient repeatedly stating that she wants to get mental health care and that she needs social work help and is willing to come to all appointments.  Will discharge home with close follow-up.   1. Other depression   2. Constipation, unspecified constipation type    2. Patient stable for discharge; was given list of resources. Will send message to Asher Muir to schedule outpatient follow-up in [place].  3. Patient plans to keep prenatal visit with Dr. Clearance Coots on Tuesday.  4. Patient started on Zoloft; will also take Reglan, Colace and Miralax. Discussed with patient that Zoloft takes a while to work, she should try it for at least 6 weeks.  5. Reviewed ob-gyn warning signs and mental health/psych precautions. Partner is supportive.   Luna Kitchens

## 2018-05-09 NOTE — Discharge Instructions (Signed)
-  Keep appt on 7-30 with Dr. Clearance CootsHarper  -[facility] at   [address],  [place] [id number] [phone] or [facility]12 and 1 -3 -They have a sliding scale!!    2nd option:  Call [facility] in the morning at [phone] to find out about walk-in clinic.  [address], [place]   -We will send a message to our Child psychotherapistsocial worker to see you.   -Take Zoloft once a day, take Scopalamine patch, Reglan, Colace, Miralax.
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
## 2018-05-09 NOTE — MAU Note (Signed)
[facility] are referring [facility] to inpatient pysch.  [facility] so we are waiting for call back w/referral facility.

## 2018-05-11 ENCOUNTER — Encounter: Payer: Self-pay | Admitting: Obstetrics

## 2018-05-11 ENCOUNTER — Ambulatory Visit: Payer: Medicaid Other | Admitting: Obstetrics

## 2018-05-11 ENCOUNTER — Ambulatory Visit (INDEPENDENT_AMBULATORY_CARE_PROVIDER_SITE_OTHER): Payer: Medicaid Other | Admitting: Obstetrics

## 2018-05-11 ENCOUNTER — Encounter: Payer: Medicaid Other | Admitting: Obstetrics & Gynecology

## 2018-05-11 ENCOUNTER — Telehealth: Payer: Self-pay | Admitting: Clinical

## 2018-05-11 ENCOUNTER — Encounter: Payer: Self-pay | Admitting: *Deleted

## 2018-05-11 VITALS — BP 109/72 | HR 92 | Wt 198.8 lb

## 2018-05-11 DIAGNOSIS — M545 Low back pain, unspecified: Secondary | ICD-10-CM

## 2018-05-11 DIAGNOSIS — Z3490 Encounter for supervision of normal pregnancy, unspecified, unspecified trimester: Secondary | ICD-10-CM

## 2018-05-11 DIAGNOSIS — G8929 Other chronic pain: Secondary | ICD-10-CM

## 2018-05-11 DIAGNOSIS — K219 Gastro-esophageal reflux disease without esophagitis: Secondary | ICD-10-CM

## 2018-05-11 DIAGNOSIS — Z3481 Encounter for supervision of other normal pregnancy, first trimester: Secondary | ICD-10-CM

## 2018-05-11 MED ORDER — RANITIDINE HCL 150 MG PO TABS: 150.0000 mg | ORAL_TABLET | Freq: Two times a day (BID) | ORAL | 5 refills | Status: DC

## 2018-05-11 NOTE — Telephone Encounter (Signed)
Pt plans to pick up Zoloft today, prior to her medical appointment; [facility] tomorrow morning (05/12/18) to establish care for ongoing outpatient therapy. Pt [facility] to [facility] in one week (about 05/18/18) to follow-up on the outcome of that visit.

## 2018-05-11 NOTE — Progress Notes (Signed)
Subjective:  Katelyn Mcfarland is a 27 y.o. [id number] at 5354w2d being seen today for ongoing prenatal care.  She is currently monitored for the following issues for this low-risk pregnancy and has Current smoker; Supervision of normal pregnancy, antepartum; GBS bacteriuria; Nausea and vomiting during pregnancy prior to [redacted] weeks gestation; Sickle cell trait (HCC); BMI 35-39; and Obesity in pregnancy on their problem list.  Patient reports heartburn.  Contractions: Irritability. Vag. Bleeding: None.   . Denies leaking of fluid.   The following portions of the patient's history were reviewed and updated as appropriate: allergies, current medications, past family history, past medical history, past social history, past surgical history and problem list. Problem list updated.  Objective:   Vitals:   05/11/18 1510  BP: 109/72  Pulse: 92  Weight: 198 lb 12.8 oz (90.2 kg)    Fetal Status: Fetal Heart Rate (bpm): 150         General:  Alert, oriented and cooperative. Patient is in no acute distress.  Skin: Skin is warm and dry. No rash noted.   Cardiovascular: Normal heart rate noted  Respiratory: Normal respiratory effort, no problems with respiration noted  Abdomen: Soft, gravid, appropriate for gestational age. Pain/Pressure: Present     Pelvic:  Cervical exam deferred        Extremities: Normal range of motion.  Edema: None  Mental Status: Normal mood and affect. Normal behavior. Normal judgment and thought content.   Urinalysis:      Assessment and Plan:  Pregnancy: [id number] at 6754w2d  1. Encounter for supervision of normal pregnancy, antepartum, unspecified gravidity  2. GERD without esophagitis Rx: - ranitidine (ZANTAC) 150 MG tablet; Take 1 tablet (150 mg total) by mouth 2 (two) times daily.  Dispense: 60 tablet; Refill: 5  3. Chronic midline low back pain without sciatica - Maternity Belt Rx  Preterm labor symptoms and general obstetric precautions including but not limited to  vaginal bleeding, contractions, leaking of fluid and fetal movement were reviewed in detail with the patient. Please refer to After Visit Summary for other counseling recommendations.  Return in about 2 weeks (around 05/25/2018) for ROB.   Brock BadHarper, Kelsee Preslar A, MD

## 2018-05-18 ENCOUNTER — Inpatient Hospital Stay (HOSPITAL_COMMUNITY)
Admission: AD | Admit: 2018-05-18 | Discharge: 2018-05-19 | Disposition: A | Discharge status: Home / Self Care | Payer: Medicaid Other | Source: Ambulatory Visit | Attending: Family Medicine | Admitting: Family Medicine

## 2018-05-18 ENCOUNTER — Encounter (HOSPITAL_COMMUNITY): Payer: Self-pay | Admitting: *Deleted

## 2018-05-18 DIAGNOSIS — Z3A14 14 weeks gestation of pregnancy: Secondary | ICD-10-CM | POA: Diagnosis not present

## 2018-05-18 DIAGNOSIS — O219 Vomiting of pregnancy, unspecified: Secondary | ICD-10-CM | POA: Diagnosis present

## 2018-05-18 DIAGNOSIS — O210 Mild hyperemesis gravidarum: Secondary | ICD-10-CM | POA: Diagnosis not present

## 2018-05-18 DIAGNOSIS — R109 Unspecified abdominal pain: Secondary | ICD-10-CM | POA: Diagnosis present

## 2018-05-18 DIAGNOSIS — Z87891 Personal history of nicotine dependence: Secondary | ICD-10-CM | POA: Diagnosis not present

## 2018-05-18 DIAGNOSIS — O26892 Other specified pregnancy related conditions, second trimester: Secondary | ICD-10-CM | POA: Diagnosis present

## 2018-05-18 LAB — URINALYSIS, ROUTINE W REFLEX MICROSCOPIC
Bilirubin Urine: NEGATIVE
Glucose, UA: NEGATIVE mg/dL
Ketones, ur: 80 mg/dL — AB
Leukocytes, UA: NEGATIVE
Nitrite: NEGATIVE
Protein, ur: 30 mg/dL — AB
Specific Gravity, Urine: 1.018 (ref 1.005–1.030)
pH: 5 (ref 5.0–8.0)

## 2018-05-18 LAB — CBC
HCT: 35.3 % — ABNORMAL LOW (ref 36.0–46.0)
Hemoglobin: 12.4 g/dL (ref 12.0–15.0)
MCH: 30.1 pg (ref 26.0–34.0)
MCHC: 35.1 g/dL (ref 30.0–36.0)
MCV: 85.7 fL (ref 78.0–100.0)
Platelets: 199 10*3/uL (ref 150–400)
RBC: 4.12 MIL/uL (ref 3.87–5.11)
RDW: 13.4 % (ref 11.5–15.5)
WBC: 9.6 10*3/uL (ref 4.0–10.5)

## 2018-05-18 LAB — COMPREHENSIVE METABOLIC PANEL
ALT: 13 U/L (ref 0–44)
AST: 19 U/L (ref 15–41)
Albumin: 3.9 g/dL (ref 3.5–5.0)
Alkaline Phosphatase: 43 U/L (ref 38–126)
Anion gap: 12 (ref 5–15)
BUN: 6 mg/dL (ref 6–20)
CO2: 20 mmol/L — ABNORMAL LOW (ref 22–32)
Calcium: 9.1 mg/dL (ref 8.9–10.3)
Chloride: 104 mmol/L (ref 98–111)
Creatinine, Ser: 0.68 mg/dL (ref 0.44–1.00)
GFR calc Af Amer: >60 mL/min (ref >60)
GFR calc non Af Amer: >60 mL/min (ref >60)
Glucose, Bld: 81 mg/dL (ref 70–99)
Potassium: 3.5 mmol/L (ref 3.5–5.1)
Sodium: 136 mmol/L (ref 135–145)
Total Bilirubin: 0.7 mg/dL (ref 0.3–1.2)
Total Protein: 7.6 g/dL (ref 6.5–8.1)

## 2018-05-18 MED ORDER — M.V.I. ADULT IV INJ
Freq: Once | INTRAVENOUS | Status: AC
  Administered 2018-05-19: 02:00:00 via INTRAVENOUS
  Filled 2018-05-18: qty 1000

## 2018-05-18 MED ORDER — FAMOTIDINE IN NACL 20-0.9 MG/50ML-% IV SOLN
20.0000 mg | Freq: Once | INTRAVENOUS | Status: AC
  Administered 2018-05-19: 20 mg via INTRAVENOUS
  Filled 2018-05-18: qty 50

## 2018-05-18 MED ORDER — SODIUM CHLORIDE 0.9 % IV SOLN
25.0000 mg | Freq: Once | INTRAVENOUS | Status: AC
  Administered 2018-05-19: 25 mg via INTRAVENOUS
  Filled 2018-05-18: qty 1

## 2018-05-18 NOTE — MAU Note (Addendum)
PT SAYS  SHE HAS BEEN VOMITING  SINCE WOKE -  - THROAT  BURNS.    TOOK REGLAN-  THIS AM--VOMITING .  PNV-  THIS  AM.  DID NOT TAKE ZOFRAN- MAKES CONSTIPATED - DOES NOT TAKE  COLACE.    FEELS  SOME CRAMPING.   [facility]

## 2018-05-18 NOTE — MAU Provider Note (Signed)
Chief Complaint: Emesis   First Provider Initiated Contact with Patient 05/18/18 2233      SUBJECTIVE HPI: Katelyn Mcfarland is a 27 y.o. [id number] at [redacted]w[redacted]d by LMP who presents to maternity admissions reporting nausea, vomiting, and abdominal pain.  She reports vomiting 5+ times daily, and not being able to keep down any food or fluids.  She has taken Reglan and Zantac today, but did not keep these down. She placed a new scopolamine patch this morning and it is still on but she does not feel any better.  She is tearful in MAU and reports she feels terrible.  She is not sure if she has tried Phenergan but she reports Zofran causes constipation which makes her feel worse.  She has mid/lower abdominal pain associated with vomiting. The pain is intermittent cramping pain. She has not tried any treatments for pain.  There are no other associated symptoms. She has not tried any other treatments.  HPI  Past Medical History:  Diagnosis Date  . Allergy   . Chlamydia    age 24yo; hx/o trichomonas age 52yo  . Chronic headache   . Depression    doing ok, declined meds  . High cholesterol   . Infection    UTI  . Vaginal Pap smear, abnormal    bx, cryo   Past Surgical History:  Procedure Laterality Date  . INDUCED ABORTION    . WISDOM TOOTH EXTRACTION     Social History   Socioeconomic History  . Marital status: Single    Spouse name: Not on file  . Number of children: Not on file  . Years of education: Not on file  . Highest education level: Not on file  Occupational History  . Occupation: student [employer]     Employer: [employer]  Social Needs  . Financial resource strain: Not on file  . Food insecurity:    Worry: Not on file    Inability: Not on file  . Transportation needs:    Medical: Not on file    Non-medical: Not on file  Tobacco Use  . Smoking status: Former Smoker    Packs/day: 0.25    Years: 16.00    Pack years: 4.00    Types: Cigarettes  . Smokeless tobacco: Never Used  .  Tobacco comment: quit x 3 wk as of 03/2012  Substance and Sexual Activity  . Alcohol use: Yes    Comment: not currently  . Drug use: No    Types: Marijuana    Comment: last Mar 2018  . Sexual activity: Not Currently  Lifestyle  . Physical activity:    Days per week: Not on file    Minutes per session: Not on file  . Stress: Not on file  Relationships  . Social connections:    Talks on phone: Not on file    Gets together: Not on file    Attends religious service: Not on file    Active member of club or organization: Not on file    Attends meetings of clubs or organizations: Not on file    Relationship status: Not on file  . Intimate partner violence:    Fear of current or ex partner: Not on file    Emotionally abused: Not on file    Physically abused: Not on file    Forced sexual activity: Not on file  Other Topics Concern  . Not on file  Social History Narrative   Single, 2 children, exercise - none.  Work - dietary aid, [employer]   No current facility-administered medications on file prior to encounter.    Current Outpatient Medications on File Prior to Encounter  Medication Sig Dispense Refill  . acetaminophen (TYLENOL) 500 MG tablet Take 500 mg by mouth every 6 (six) hours as needed for mild pain.    Marland Kitchen docusate sodium (COLACE) 100 MG capsule Take 1 capsule (100 mg total) by mouth every 12 (twelve) hours. 60 capsule 0  . Elastic Bandages & Supports (COMFORT FIT MATERNITY SUPP SM) MISC Wear as directed. 1 each 0  . famotidine (PEPCID) 20 MG tablet Take 1 tablet (20 mg total) by mouth 2 (two) times daily. 60 tablet 3  . metoCLOPramide (REGLAN) 10 MG tablet Take 1 tablet (10 mg total) by mouth every 6 (six) hours for 14 days. After 14 days, take every six hours as needed. 30 tablet 2  . ondansetron (ZOFRAN-ODT) 4 MG disintegrating tablet Take 1 tablet (4 mg total) by mouth every 8 (eight) hours. Take q8h x 14 days and then do q8h prn after that  40 tablet 0  . polyethylene glycol (MIRALAX / GLYCOLAX) packet Take 17 g by mouth daily. 14 each 0  . Prenat-Fe Poly-Methfol-FA-DHA (VITAFOL ULTRA) 29-0.6-0.4-200 MG CAPS Take 1 tablet by mouth daily before breakfast. 90 capsule 4  . pyridOXINE (VITAMIN B-6) 25 MG tablet Take 1 tablet (25 mg total) by mouth 2 (two) times daily. 60 tablet 1  . scopolamine (TRANSDERM-SCOP) 1 MG/3DAYS Place 1 patch (1.5 mg total) onto the skin every 3 (three) days. 10 patch 1  . sertraline (ZOLOFT) 50 MG tablet Take 1 tablet (50 mg total) by mouth daily. 30 tablet 3   No Known Allergies  ROS:  Review of Systems  Constitutional: Negative for chills, fatigue and fever.  Eyes: Negative for visual disturbance.  Respiratory: Negative for shortness of breath.   Cardiovascular: Negative for chest pain.  Gastrointestinal: Positive for abdominal pain, nausea and vomiting. Negative for constipation.  Genitourinary: Negative for difficulty urinating, dysuria, flank pain, pelvic pain, vaginal bleeding, vaginal discharge and vaginal pain.  Neurological: Negative for dizziness and headaches.  Psychiatric/Behavioral: Negative.      I have reviewed patient's Past Medical Hx, Surgical Hx, Family Hx, Social Hx, medications and allergies.   Physical Exam   Patient Vitals for the past 24 hrs:  BP Temp Temp src Pulse Resp Height Weight  05/19/18 0514 106/78 98.4 F (36.9 C) Oral 82 16 - -  05/18/18 2131 117/72 98.9 F (37.2 C) Oral (!) 109 20 5' (1.524 m) 189 lb 4 oz (85.8 kg)   Constitutional: Well-developed, well-nourished female in moderate distress.  Cardiovascular: normal rate Respiratory: normal effort GI: Abd soft, non-tender. Pos BS x 4 MS: Extremities nontender, no edema, normal ROM Neurologic: Alert and oriented x 4.  GU: Neg CVAT.  PELVIC EXAM: Deferred  FHT 155 by doppler  LAB RESULTS Results for orders placed or performed during the hospital encounter of 05/18/18 (from the past 24 hour(s))   Urinalysis, Routine w reflex microscopic     Status: Abnormal   Collection Time: 05/18/18  9:42 PM  Result Value Ref Range   Color, Urine YELLOW YELLOW   APPearance CLOUDY (A) CLEAR   Specific Gravity, Urine 1.018 1.005 - 1.030   pH 5.0 5.0 - 8.0   Glucose, UA NEGATIVE NEGATIVE mg/dL   Hgb urine dipstick SMALL (A) NEGATIVE   Bilirubin Urine NEGATIVE NEGATIVE   Ketones, ur 80 (  A) NEGATIVE mg/dL   Protein, ur 30 (A) NEGATIVE mg/dL   Nitrite NEGATIVE NEGATIVE   Leukocytes, UA NEGATIVE NEGATIVE   RBC / HPF 0-5 0 - 5 RBC/hpf   WBC, UA 0-5 0 - 5 WBC/hpf   Bacteria, UA MANY (A) NONE SEEN   Squamous Epithelial / LPF 21-50 0 - 5   Mucus PRESENT    Hyaline Casts, UA PRESENT   CBC     Status: Abnormal   Collection Time: 05/18/18 10:19 PM  Result Value Ref Range   WBC 9.6 4.0 - 10.5 K/uL   RBC 4.12 3.87 - 5.11 MIL/uL   Hemoglobin 12.4 12.0 - 15.0 g/dL   HCT 45.435.3 (L) 09.836.0 - 11.946.0 %   MCV 85.7 78.0 - 100.0 fL   MCH 30.1 26.0 - 34.0 pg   MCHC 35.1 30.0 - 36.0 g/dL   RDW 14.713.4 82.911.5 - 56.215.5 %   Platelets 199 150 - 400 K/uL  Comprehensive metabolic panel     Status: Abnormal   Collection Time: 05/18/18 10:19 PM  Result Value Ref Range   Sodium 136 135 - 145 mmol/L   Potassium 3.5 3.5 - 5.1 mmol/L   Chloride 104 98 - 111 mmol/L   CO2 20 (L) 22 - 32 mmol/L   Glucose, Bld 81 70 - 99 mg/dL   BUN 6 6 - 20 mg/dL   Creatinine, Ser 1.300.68 0.44 - 1.00 mg/dL   Calcium 9.1 8.9 - 86.510.3 mg/dL   Total Protein 7.6 6.5 - 8.1 g/dL   Albumin 3.9 3.5 - 5.0 g/dL   AST 19 15 - 41 U/L   ALT 13 0 - 44 U/L   Alkaline Phosphatase 43 38 - 126 U/L   Total Bilirubin 0.7 0.3 - 1.2 mg/dL   GFR calc non Af Amer >60 >60 mL/min   GFR calc Af Amer >60 >60 mL/min   Anion gap 12 5 - 15    O/Positive/-- (07/16 1114)  IMAGING [place] Ob Comp Less 14 Wks  Result Date: 05/01/2018 CLINICAL DATA:  Nausea and vomiting. Estimated gestational age per LMP 11 weeks 6 days. No vaginal bleeding. EXAM: OBSTETRIC <14 WK ULTRASOUND  TECHNIQUE: Transabdominal ultrasound was performed for evaluation of the gestation as well as the maternal uterus and adnexal regions. COMPARISON:  None. FINDINGS: Intrauterine gestational sac: Single visualized. Yolk sac:  Not visualized. Embryo:  Visualized. Cardiac Activity: Visualized. Heart Rate: 149 bpm CRL: 64.3 mm   12 w 6 d                  US EDC: 11/07/2018 Subchorionic hemorrhage:  None visualized. Maternal uterus/adnexae: Ovaries are within normal. No free pelvic fluid. IMPRESSION: Single live IUP with estimated gestational age 712 weeks 6 days. Electronically Signed   By: Elberta Fortisaniel  Boyle M.D.   On: 05/01/2018 09:32    MAU Management/MDM: Pt with ongoing hyperemesis.  Taking some meds but unable to tolerate Zofran due to constipation.  CBC, CMP wnl today, IV fluids given with Phenergan IV, Pepcid IV.  Pt reports feeling better and tolerates PO fluids in MAU.  Rx for Phenergan and medrol taper per pharmacy protocol.  [facility], return to MAU with worsening symptoms. Pt discharged with strict return precautions.  ASSESSMENT 1. Nausea and vomiting during pregnancy prior to [redacted] weeks gestation   2. Hyperemesis arising during pregnancy     PLAN Discharge home  Follow-up Information    [facility] Follow up.   Why:  Return to MAU as needed for emergencies. Contact information: [address] [place] [address] [phone]          Sharen Counter Certified Nurse-Midwife 05/19/2018  6:35 AM

## 2018-05-19 DIAGNOSIS — O219 Vomiting of pregnancy, unspecified: Secondary | ICD-10-CM

## 2018-05-19 MED ORDER — METHYLPREDNISOLONE 4 MG PO TABS: ORAL_TABLET | ORAL | 0 refills | Status: DC

## 2018-05-19 MED ORDER — PROMETHAZINE HCL 25 MG PO TABS: 12.5000 mg | ORAL_TABLET | Freq: Four times a day (QID) | ORAL | 5 refills | Status: DC | PRN

## 2018-05-19 NOTE — Discharge Instructions (Signed)
Pharmacy Consult:  MEDROL (METHYLPREDNISOLONE) TAPER  FOR HYPEREMESIS GRAVIDARUM PATIENTS  The following is a 14 day taper of methylprednisolone for hyperemesis. Doses on day 1 will be given IV. All doses starting on day 2 will be given PO. (If patient cannot tolerate oral medications, contact the pharmacy to change route to IV.)   Date  Day  Morning  Midday  Bedtime    1  --  --  48 mg    2  16 mg  16 mg  16 mg    3  16 mg  16 mg  16 mg    4  16 mg  8 mg  16 mg    5  16 mg  8 mg  8 mg    6  8 mg  8 mg  8 mg    7  8 mg  4 mg  8 mg    8  8 mg  4 mg  4 mg    9  8 mg  4 mg     10  8 mg  4 mg     11  8 mg      12  8 mg      13  4 mg      14  4 mg      Total 70 tablets  Check fasting blood sugars daily while on the taper. Notify MD if fasting blood sugar>95.

## 2018-05-19 NOTE — MAU Note (Signed)
PO ICE CHIPS  

## 2018-05-20 LAB — CULTURE, OB URINE

## 2018-05-21 ENCOUNTER — Telehealth: Payer: Self-pay | Admitting: Clinical

## 2018-05-21 NOTE — Telephone Encounter (Signed)
Left HIPPA-compliant message to [facility] [place] [facility]

## 2018-05-21 NOTE — Telephone Encounter (Signed)
Pt has not [facility], due to transportation issues/lack of gas money; has not been taking Zoloft recently, as she has not been able to "keep it down", due to nausea/morning sickness.   Pt is encouraged to set up Medicaid transportation for medical appointments([phone]) and to obtain a reduced-fare bus ID (information at [phone]) as an alternative form of transportation.   *Note that patient was not given the above numbers over the phone, as she was unable to find a writing instrument. NEXT APPOINTMENT @ [facility] 05/25/18, PLEASE GIVE PT NUMBERS ABOVE.

## 2018-05-25 ENCOUNTER — Encounter: Payer: Medicaid Other | Admitting: Obstetrics

## 2018-05-25 ENCOUNTER — Ambulatory Visit (INDEPENDENT_AMBULATORY_CARE_PROVIDER_SITE_OTHER): Payer: Medicaid Other | Admitting: Obstetrics & Gynecology

## 2018-05-25 VITALS — BP 114/80 | HR 90 | Wt 193.2 lb

## 2018-05-25 DIAGNOSIS — Z3480 Encounter for supervision of other normal pregnancy, unspecified trimester: Secondary | ICD-10-CM

## 2018-05-25 DIAGNOSIS — Z3689 Encounter for other specified antenatal screening: Secondary | ICD-10-CM

## 2018-05-25 DIAGNOSIS — O219 Vomiting of pregnancy, unspecified: Secondary | ICD-10-CM

## 2018-05-25 DIAGNOSIS — Z3482 Encounter for supervision of other normal pregnancy, second trimester: Secondary | ICD-10-CM

## 2018-05-25 NOTE — Progress Notes (Signed)
   PRENATAL VISIT NOTE  Subjective:  Katelyn Mcfarland is a 27 y.o. [id number] at 5639w2d being seen today for ongoing prenatal care.  She is currently monitored for the following issues for this low-risk pregnancy and has Current smoker; Supervision of normal pregnancy, antepartum; GBS bacteriuria; Nausea and vomiting during pregnancy prior to [redacted] weeks gestation; Sickle cell trait (HCC); BMI 35-39; and Obesity in pregnancy on their problem list.  Patient reports no complaints. Recently admitted for N/V, doing well on Medrol taper and other antiemetics as needed.   Contractions: Irritability. Vag. Bleeding: None.  Movement: Present. Denies leaking of fluid.   The following portions of the patient's history were reviewed and updated as appropriate: allergies, current medications, past family history, past medical history, past social history, past surgical history and problem list. Problem list updated.  Objective:   Vitals:   05/25/18 0912  BP: 114/80  Pulse: 90  Weight: 193 lb 3.2 oz (87.6 kg)    Fetal Status: Fetal Heart Rate (bpm): 150s on u/s   Movement: Present     General:  Alert, oriented and cooperative. Patient is in no acute distress.  Skin: Skin is warm and dry. No rash noted.   Cardiovascular: Normal heart rate noted  Respiratory: Normal respiratory effort, no problems with respiration noted  Abdomen: Soft, gravid, appropriate for gestational age.  Pain/Pressure: Absent     Pelvic: Cervical exam deferred        Extremities: Normal range of motion.  Edema: None  Mental Status: Normal mood and affect. Normal behavior. Normal judgment and thought content.   Assessment and Plan:  Pregnancy: [id number] at 5439w2d  1. Nausea and vomiting during pregnancy prior to [redacted] weeks gestation Continue antiemetics as needed.   2. Encounter for fetal anatomic survey Anatomy scan ordered - US MFM OB COMP + 14 WK; Future  3. Supervision of other normal pregnancy, antepartum AFP and othjr labs  ordered - SMN1 COPY NUMBER ANALYSIS (SMA Carrier Screen) - Cystic Fibrosis Mutation 97 - AFP, Serum, Open Spina Bifida No other complaints or concerns.  Routine obstetric precautions reviewed. Please refer to After Visit Summary for other counseling recommendations.  Return in about 4 weeks (around 06/22/2018) for OB Visit.   [name], MD

## 2018-05-25 NOTE — Patient Instructions (Signed)
Return to clinic for any scheduled appointments or obstetric concerns, or go to MAU for evaluation  Second Trimester of Pregnancy The second trimester is from week 13 through week 28, month 4 through 6. This is often the time in pregnancy that you feel your best. Often times, morning sickness has lessened or quit. You may have more energy, and you may get hungry more often. Your unborn baby (fetus) is growing rapidly. At the end of the sixth month, he or she is about 9 inches long and weighs about 1 pounds. You will likely feel the baby move (quickening) between 18 and 20 weeks of pregnancy.  Research childbirth classes and hospital preregistration at [email or web address]  Follow these instructions at home:  Avoid all smoking, herbs, and alcohol. Avoid drugs not approved by your doctor.  Do not use any tobacco products, including cigarettes, chewing tobacco, and electronic cigarettes. If you need help quitting, ask your doctor. You may get counseling or other support to help you quit.  Only take medicine as told by your doctor. Some medicines are safe and some are not during pregnancy.  Exercise only as told by your doctor. Stop exercising if you start having cramps.  Eat regular, healthy meals.  Wear a good support bra if your breasts are tender.  Do not use hot tubs, steam rooms, or saunas.  Wear your seat belt when driving.  Avoid raw meat, uncooked cheese, and liter boxes and soil used by cats.  Take your prenatal vitamins.  Take 1500-2000 milligrams of calcium daily starting at the 20th week of pregnancy until you deliver your baby.  Try taking medicine that helps you poop (stool softener) as needed, and if your doctor approves. Eat more fiber by eating fresh fruit, vegetables, and whole grains. Drink enough fluids to keep your pee (urine) clear or pale yellow.  Take warm water baths (sitz baths) to soothe pain or discomfort caused by hemorrhoids. Use hemorrhoid cream if your  doctor approves.  If you have puffy, bulging veins (varicose veins), wear support hose. Raise (elevate) your feet for 15 minutes, 3-4 times a day. Limit salt in your diet.  Avoid heavy lifting, wear low heals, and sit up straight.  Rest with your legs raised if you have leg cramps or low back pain.  Visit your dentist if you have not gone during your pregnancy. Use a soft toothbrush to brush your teeth. Be gentle when you floss.  You can have sex (intercourse) unless your doctor tells you not to.  Go to your doctor visits.  Get help if:  You feel dizzy.  You have mild cramps or pressure in your lower belly (abdomen).  You have a nagging pain in your belly area.  You continue to feel sick to your stomach (nauseous), throw up (vomit), or have watery poop (diarrhea).  You have bad smelling fluid coming from your vagina.  You have pain with peeing (urination). Get help right away if:  You have a fever.  You are leaking fluid from your vagina.  You have spotting or bleeding from your vagina.  You have severe belly cramping or pain.  You lose or gain weight rapidly.  You have trouble catching your breath and have chest pain.  You notice sudden or extreme puffiness (swelling) of your face, hands, ankles, feet, or legs.  You have not felt the baby move in over an hour.  You have severe headaches that do not go away with medicine.  You have vision changes.   This information is not intended to replace advice given to you by your health care provider. Make sure you discuss any questions you have with your health care provider. Document Released: 12/24/2009 Document Revised: 03/06/2016 Document Reviewed: 11/30/2012 Elsevier Interactive Patient Education  2017 [employer]vier Inc.    

## 2018-06-01 ENCOUNTER — Telehealth: Payer: Self-pay

## 2018-06-01 NOTE — Telephone Encounter (Signed)
Pt would like to know if she can continue to take the Medrol because she still had some left over. I spoke with Dr Clearance CootsHarper who advised the patient not restart the Medrol if she was done with the taper. I advised pt of this and she said she is still having a significant amount of nausea. I advised the pt per Dr. Clearance CootsHarper to try taking the zofran with the colace to keep her from getting constipated. Pt verbalized that she would try this. I advised pt that she should go to the hospital if shes still not able to keep fluids down. Pt verbalized understanding.

## 2018-06-03 LAB — AFP, SERUM, OPEN SPINA BIFIDA
AFP MoM: 1.57
AFP Value: 43.6 ng/mL
Gest. Age on Collection Date: 15.3 weeks
Maternal Age At EDD: 29.6 yr
OSBR Risk 1 IN: 4541
Test Results:: NEGATIVE
Weight: 193 [lb_av]

## 2018-06-03 LAB — SMN1 COPY NUMBER ANALYSIS (SMA CARRIER SCREENING)

## 2018-06-03 LAB — CYSTIC FIBROSIS MUTATION 97: Interpretation: NOT DETECTED

## 2018-06-07 ENCOUNTER — Other Ambulatory Visit: Payer: Self-pay

## 2018-06-07 ENCOUNTER — Inpatient Hospital Stay (HOSPITAL_COMMUNITY)
Admission: AD | Admit: 2018-06-07 | Discharge: 2018-06-07 | Disposition: A | Discharge status: Home / Self Care | Payer: Medicaid Other | Source: Ambulatory Visit | Attending: Obstetrics and Gynecology | Admitting: Obstetrics and Gynecology

## 2018-06-07 ENCOUNTER — Telehealth: Payer: Self-pay

## 2018-06-07 DIAGNOSIS — M549 Dorsalgia, unspecified: Secondary | ICD-10-CM | POA: Insufficient documentation

## 2018-06-07 DIAGNOSIS — O26892 Other specified pregnancy related conditions, second trimester: Secondary | ICD-10-CM | POA: Diagnosis present

## 2018-06-07 DIAGNOSIS — W19XXXA Unspecified fall, initial encounter: Secondary | ICD-10-CM | POA: Insufficient documentation

## 2018-06-07 DIAGNOSIS — O99282 Endocrine, nutritional and metabolic diseases complicating pregnancy, second trimester: Secondary | ICD-10-CM | POA: Insufficient documentation

## 2018-06-07 DIAGNOSIS — R109 Unspecified abdominal pain: Secondary | ICD-10-CM

## 2018-06-07 DIAGNOSIS — R1030 Lower abdominal pain, unspecified: Secondary | ICD-10-CM | POA: Insufficient documentation

## 2018-06-07 DIAGNOSIS — Z79899 Other long term (current) drug therapy: Secondary | ICD-10-CM | POA: Insufficient documentation

## 2018-06-07 DIAGNOSIS — Z8249 Family history of ischemic heart disease and other diseases of the circulatory system: Secondary | ICD-10-CM | POA: Insufficient documentation

## 2018-06-07 DIAGNOSIS — E7800 Pure hypercholesterolemia, unspecified: Secondary | ICD-10-CM | POA: Diagnosis not present

## 2018-06-07 DIAGNOSIS — O99342 Other mental disorders complicating pregnancy, second trimester: Secondary | ICD-10-CM | POA: Insufficient documentation

## 2018-06-07 DIAGNOSIS — O26899 Other specified pregnancy related conditions, unspecified trimester: Secondary | ICD-10-CM | POA: Diagnosis not present

## 2018-06-07 DIAGNOSIS — Z3A17 17 weeks gestation of pregnancy: Secondary | ICD-10-CM

## 2018-06-07 DIAGNOSIS — F329 Major depressive disorder, single episode, unspecified: Secondary | ICD-10-CM | POA: Diagnosis not present

## 2018-06-07 DIAGNOSIS — Z87891 Personal history of nicotine dependence: Secondary | ICD-10-CM | POA: Insufficient documentation

## 2018-06-07 LAB — URINALYSIS, ROUTINE W REFLEX MICROSCOPIC
Bilirubin Urine: NEGATIVE
Glucose, UA: NEGATIVE mg/dL
Ketones, ur: NEGATIVE mg/dL
Leukocytes, UA: NEGATIVE
Nitrite: NEGATIVE
Protein, ur: NEGATIVE mg/dL
Specific Gravity, Urine: 1.004 — ABNORMAL LOW (ref 1.005–1.030)
pH: 6 (ref 5.0–8.0)

## 2018-06-07 MED ORDER — CYCLOBENZAPRINE HCL 10 MG PO TABS: 10.0000 mg | ORAL_TABLET | Freq: Two times a day (BID) | ORAL | 0 refills | Status: DC | PRN

## 2018-06-07 MED ORDER — CYCLOBENZAPRINE HCL 5 MG PO TABS
5.0000 mg | ORAL_TABLET | Freq: Once | ORAL | Status: AC
  Administered 2018-06-07: 5 mg via ORAL
  Filled 2018-06-07: qty 1

## 2018-06-07 NOTE — MAU Note (Signed)
Urine in lab 

## 2018-06-07 NOTE — Discharge Instructions (Signed)

## 2018-06-07 NOTE — MAU Provider Note (Signed)
History     CSN: [id number]  Arrival date and time: 06/07/18 1730   First Provider Initiated Contact with Patient 06/07/18 1958      Chief Complaint  Patient presents with  . Fall  . Abdominal Pain  . Back Pain   HPI Katelyn Mcfarland is a 27 y.o. [id number] at [redacted]w[redacted]d who presents with lower abdominal pain. She states she fell on Friday and hit her left side on the way down. She states since then she has been sore and cramping. She denies any vaginal bleeding, leaking or discharge. Reports feeling intermittent movement.   OB History    Gravida  6   Para  2   Term  2   Preterm  0   AB  3   Living  2     SAB  2   TAB  1   Ectopic  0   Multiple  0   Live Births  2           Past Medical History:  Diagnosis Date  . Allergy   . Chlamydia    age 87yo; hx/o trichomonas age 89yo  . Chronic headache   . Depression    doing ok, declined meds  . High cholesterol   . Infection    UTI  . Vaginal Pap smear, abnormal    bx, cryo    Past Surgical History:  Procedure Laterality Date  . INDUCED ABORTION    . WISDOM TOOTH EXTRACTION      Family History  Problem Relation Age of Onset  . Hypertension Mother   . Diabetes Mother   . Asthma Sister   . Cancer Paternal Grandmother   . Cancer Paternal Grandfather   . Cancer Maternal Grandmother   . Kidney disease Maternal Grandmother   . Diabetes Maternal Grandmother   . Heart disease Neg Hx   . Stroke Neg Hx     Social History   Tobacco Use  . Smoking status: Former Smoker    Packs/day: 0.25    Years: 16.00    Pack years: 4.00    Types: Cigarettes  . Smokeless tobacco: Never Used  . Tobacco comment: quit x 3 wk as of 03/2012  Substance Use Topics  . Alcohol use: Yes    Comment: not currently  . Drug use: No    Types: Marijuana    Comment: last Mar 2018    Allergies: No Known Allergies  Medications Prior to Admission  Medication Sig Dispense Refill Last Dose  . acetaminophen (TYLENOL) 500 MG  tablet Take 500 mg by mouth every 6 (six) hours as needed for mild pain.   Taking  . docusate sodium (COLACE) 100 MG capsule Take 1 capsule (100 mg total) by mouth every 12 (twelve) hours. 60 capsule 0 Taking  . Elastic Bandages & Supports (COMFORT FIT MATERNITY SUPP SM) MISC Wear as directed. 1 each 0 Taking  . famotidine (PEPCID) 20 MG tablet Take 1 tablet (20 mg total) by mouth 2 (two) times daily. 60 tablet 3 Taking  . methylPREDNISolone (MEDROL) 4 MG tablet See Steroid Taper instructions 70 tablet 0 Taking  . metoCLOPramide (REGLAN) 10 MG tablet Take 1 tablet (10 mg total) by mouth every 6 (six) hours for 14 days. After 14 days, take every six hours as needed. 30 tablet 2 Taking  . ondansetron (ZOFRAN-ODT) 4 MG disintegrating tablet Take 1 tablet (4 mg total) by mouth every 8 (eight) hours. Take q8h x 14 days and then  do q8h prn after that 40 tablet 0 Taking  . polyethylene glycol (MIRALAX / GLYCOLAX) packet Take 17 g by mouth daily. 14 each 0 Taking  . Prenat-Fe Poly-Methfol-FA-DHA (VITAFOL ULTRA) 29-0.6-0.4-200 MG CAPS Take 1 tablet by mouth daily before breakfast. 90 capsule 4 Taking  . promethazine (PHENERGAN) 25 MG tablet Take 0.5-1 tablets (12.5-25 mg total) by mouth every 6 (six) hours as needed. 30 tablet 5 Taking  . pyridOXINE (VITAMIN B-6) 25 MG tablet Take 1 tablet (25 mg total) by mouth 2 (two) times daily. 60 tablet 1 Taking  . ranitidine (ZANTAC) 150 MG tablet Take 1 tablet (150 mg total) by mouth 2 (two) times daily. 60 tablet 5 Taking  . scopolamine (TRANSDERM-SCOP) 1 MG/3DAYS Place 1 patch (1.5 mg total) onto the skin every 3 (three) days. 10 patch 1 Taking  . sertraline (ZOLOFT) 50 MG tablet Take 1 tablet (50 mg total) by mouth daily. 30 tablet 3 Taking    Review of Systems  Constitutional: Negative.  Negative for fatigue and fever.  HENT: Negative.   Respiratory: Negative.  Negative for shortness of breath.   Cardiovascular: Negative.  Negative for chest pain.   Gastrointestinal: Positive for abdominal pain. Negative for constipation, diarrhea, nausea and vomiting.  Genitourinary: Negative.  Negative for dysuria, vaginal bleeding and vaginal discharge.  Neurological: Negative.  Negative for dizziness and headaches.   Physical Exam   Blood pressure 109/66, pulse 84, temperature 98.4 F (36.9 C), temperature source Oral, resp. rate 18, weight 86.8 kg, last menstrual period 02/07/2018, SpO2 100 %, unknown if currently breastfeeding.  Physical Exam  Nursing note and vitals reviewed. Constitutional: She is oriented to person, place, and time. She appears well-developed and well-nourished. No distress.  HENT:  Head: Normocephalic.  Eyes: Pupils are equal, round, and reactive to light.  Cardiovascular: Normal rate, regular rhythm and normal heart sounds.  Respiratory: Effort normal and breath sounds normal. No respiratory distress.  GI: Soft. Bowel sounds are normal. She exhibits no distension. There is no tenderness.  Neurological: She is alert and oriented to person, place, and time.  Skin: Skin is warm and dry.  Psychiatric: She has a normal mood and affect. Her behavior is normal. Judgment and thought content normal.   Dilation: Closed Effacement (%): Thick Cervical Position: Posterior Exam by:: Lloyd Huger, CNM  FHT: 159 bpm  MAU Course  Procedures Results for orders placed or performed during the hospital encounter of 06/07/18 (from the past 24 hour(s))  Urinalysis, Routine w reflex microscopic     Status: Abnormal   Collection Time: 06/07/18  5:42 PM  Result Value Ref Range   Color, Urine STRAW (A) YELLOW   APPearance CLEAR CLEAR   Specific Gravity, Urine 1.004 (L) 1.005 - 1.030   pH 6.0 5.0 - 8.0   Glucose, UA NEGATIVE NEGATIVE mg/dL   Hgb urine dipstick SMALL (A) NEGATIVE   Bilirubin Urine NEGATIVE NEGATIVE   Ketones, ur NEGATIVE NEGATIVE mg/dL   Protein, ur NEGATIVE NEGATIVE mg/dL   Nitrite NEGATIVE NEGATIVE   Leukocytes, UA  NEGATIVE NEGATIVE   RBC / HPF 0-5 0 - 5 RBC/hpf   WBC, UA 0-5 0 - 5 WBC/hpf   Bacteria, UA RARE (A) NONE SEEN   Squamous Epithelial / LPF 0-5 0 - 5   MDM UA Flexeril PO  Assessment and Plan   1. Abdominal pain affecting pregnancy   2. [redacted] weeks gestation of pregnancy    -Discharge home in stable condition -Rx for flexeril given to  patient -Fall precautions discussed -Patient advised to follow-up with [facility] as scheduled for prenatal care -Patient may return to MAU as needed or if her condition were to change or worsen  Rolm BookbinderCaroline M Chynna Buerkle CNM 06/07/2018, 7:58 PM

## 2018-06-07 NOTE — MAU Note (Signed)
Larey SeatFell on the steps on Friday, landed on her back. Slipped on the 2 steps.  Hurt left side trying to catch herself from falling.  The pain has been increasing. Tried to wait until today, cause she was really sick over the weekend. Called office, they told her to come here.
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
## 2018-06-09 ENCOUNTER — Telehealth: Payer: Self-pay

## 2018-06-09 NOTE — Telephone Encounter (Signed)
TC from patient regarding constipation  Pt took ducolax this morning and has not had BM yet Pt wanted to exceed dose Pt advised to not take more than directed.  And drink at least 64 oz a water a day Pt has only had 6-8 oz of water  Pt advised pills can take up to 6-12 hrs to work.  Pt voiced understanding

## 2018-06-12 ENCOUNTER — Other Ambulatory Visit: Payer: Self-pay

## 2018-06-12 ENCOUNTER — Inpatient Hospital Stay (HOSPITAL_COMMUNITY)
Admission: AD | Admit: 2018-06-12 | Discharge: 2018-06-13 | Discharge status: Against Medical Advice | Payer: Medicaid Other | Attending: Obstetrics and Gynecology | Admitting: Obstetrics and Gynecology

## 2018-06-12 ENCOUNTER — Encounter (HOSPITAL_COMMUNITY): Payer: Self-pay

## 2018-06-12 DIAGNOSIS — M549 Dorsalgia, unspecified: Secondary | ICD-10-CM | POA: Insufficient documentation

## 2018-06-12 DIAGNOSIS — F329 Major depressive disorder, single episode, unspecified: Secondary | ICD-10-CM | POA: Diagnosis not present

## 2018-06-12 DIAGNOSIS — W19XXXA Unspecified fall, initial encounter: Secondary | ICD-10-CM | POA: Insufficient documentation

## 2018-06-12 DIAGNOSIS — R109 Unspecified abdominal pain: Secondary | ICD-10-CM | POA: Diagnosis not present

## 2018-06-12 DIAGNOSIS — T1490XA Injury, unspecified, initial encounter: Secondary | ICD-10-CM | POA: Insufficient documentation

## 2018-06-12 DIAGNOSIS — O9A212 Injury, poisoning and certain other consequences of external causes complicating pregnancy, second trimester: Secondary | ICD-10-CM | POA: Diagnosis present

## 2018-06-12 DIAGNOSIS — O99332 Smoking (tobacco) complicating pregnancy, second trimester: Secondary | ICD-10-CM | POA: Insufficient documentation

## 2018-06-12 DIAGNOSIS — Z3480 Encounter for supervision of other normal pregnancy, unspecified trimester: Secondary | ICD-10-CM

## 2018-06-12 DIAGNOSIS — O99282 Endocrine, nutritional and metabolic diseases complicating pregnancy, second trimester: Secondary | ICD-10-CM | POA: Insufficient documentation

## 2018-06-12 DIAGNOSIS — O99342 Other mental disorders complicating pregnancy, second trimester: Secondary | ICD-10-CM | POA: Insufficient documentation

## 2018-06-12 DIAGNOSIS — O26892 Other specified pregnancy related conditions, second trimester: Secondary | ICD-10-CM | POA: Diagnosis not present

## 2018-06-12 DIAGNOSIS — Z79899 Other long term (current) drug therapy: Secondary | ICD-10-CM | POA: Insufficient documentation

## 2018-06-12 DIAGNOSIS — Z3A17 17 weeks gestation of pregnancy: Secondary | ICD-10-CM | POA: Diagnosis present

## 2018-06-12 DIAGNOSIS — F17210 Nicotine dependence, cigarettes, uncomplicated: Secondary | ICD-10-CM | POA: Insufficient documentation

## 2018-06-12 DIAGNOSIS — R45851 Suicidal ideations: Secondary | ICD-10-CM

## 2018-06-12 LAB — URINALYSIS, ROUTINE W REFLEX MICROSCOPIC
Bilirubin Urine: NEGATIVE
Glucose, UA: NEGATIVE mg/dL
Ketones, ur: 5 mg/dL — AB
Leukocytes, UA: NEGATIVE
Nitrite: NEGATIVE
Protein, ur: NEGATIVE mg/dL
Specific Gravity, Urine: 1.014 (ref 1.005–1.030)
pH: 6 (ref 5.0–8.0)

## 2018-06-12 MED ORDER — CYCLOBENZAPRINE HCL 10 MG PO TABS
10.0000 mg | ORAL_TABLET | Freq: Once | ORAL | Status: AC
  Administered 2018-06-12: 10 mg via ORAL
  Filled 2018-06-12: qty 1

## 2018-06-12 MED ORDER — LACTATED RINGERS IV BOLUS
1000.0000 mL | Freq: Once | INTRAVENOUS | Status: AC
  Administered 2018-06-12: 1000 mL via INTRAVENOUS

## 2018-06-12 MED ORDER — PROMETHAZINE HCL 25 MG/ML IJ SOLN: 25.0000 mg | Freq: Once | INTRAMUSCULAR | Status: DC

## 2018-06-12 MED ORDER — PROMETHAZINE HCL 25 MG/ML IJ SOLN
25.0000 mg | Freq: Once | INTRAMUSCULAR | Status: AC
  Administered 2018-06-12: 25 mg via INTRAVENOUS
  Filled 2018-06-12: qty 1
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
## 2018-06-12 NOTE — Progress Notes (Signed)
TTS spoke with EDP Raelyn Moraawson, Rolitta, CNM and advised of the disposition.  Katelyn Mcfarland, MSW, LCSW Therapeutic Triage Specialist  [phone]

## 2018-06-12 NOTE — Progress Notes (Signed)
TTS spoke with Raelyn Moraawson, Rolitta, CNM who states she will place telepsych cart in the pt's room for the assessment, TTS called telepsych cart multiple times and cart says "call ended could not reach cart." TTS will continue attempting to call telepsych cart.  Princess BruinsAquicha Jackye Dever, MSW, LCSW Therapeutic Triage Specialist  [phone]

## 2018-06-12 NOTE — Progress Notes (Signed)
Per Nira ConnJason Berry, NP pt is recommended for overnight observation for safety and stabilization and to be reassessed in the AM by psych. pt's nurse Annye EnglishLomax, Madison E, RN has been advised. EDP currently unavailable but RN states she will have her call back to review disposition.  Katelyn BruinsAquicha Damarius Karnes, MSW, LCSW Therapeutic Triage Specialist  [phone]

## 2018-06-13 ENCOUNTER — Telehealth: Payer: Self-pay | Admitting: Advanced Practice Midwife
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
## 2018-06-13 NOTE — MAU Note (Signed)
Pt. Left AMA at around 0920. Pt. Had refused telepsych. Consult and was waiting for a consult from [facility]. During AM report RN was told  Pt. Did not have sitter during night shift because there was no active suicide plan. Pt. Was sleeping at 0715, 0820 when RN rounded, but was not in her room at 0920 round.  CNM made aware

## 2018-06-13 NOTE — Telephone Encounter (Signed)
Pt eloped from MAU while awaiting Psych consult. No answer. Left VM to call MAU.

## 2018-06-13 NOTE — MAU Provider Note (Signed)
Pt sleeping per RN at shift change. Received report from Raelyn Mora that pt will be getting in-person psych consult this morning.   RN stated that pt asked when psych would be coming and verbalized that she didn't want to go to [facility]. CNM Called Dr. Debroah Loop, attending to see if Psych had called him. He was in the OR. Asked him to call back afterward.  RN told CNM that she needed to have IP Psych Consult order placed in Epic. While CNM was entering order pt eloped from MAU. Pt was not being held involuntarily. Staff and Security looked for pt and could not find her.   Katrinka Blazing, [place], CNM 06/13/2018 10:08 AM

## 2018-06-18 ENCOUNTER — Encounter (HOSPITAL_COMMUNITY): Payer: Self-pay

## 2018-06-20 ENCOUNTER — Encounter (HOSPITAL_COMMUNITY): Payer: Self-pay | Admitting: Anesthesiology

## 2018-06-20 ENCOUNTER — Encounter (HOSPITAL_COMMUNITY): Payer: Self-pay | Admitting: *Deleted

## 2018-06-20 ENCOUNTER — Inpatient Hospital Stay (HOSPITAL_COMMUNITY)
Admission: AD | Admit: 2018-06-20 | Discharge: 2018-06-20 | Disposition: A | Discharge status: Home / Self Care | Payer: Medicaid Other | Source: Ambulatory Visit | Attending: Obstetrics and Gynecology | Admitting: Obstetrics and Gynecology

## 2018-06-20 DIAGNOSIS — Z9889 Other specified postprocedural states: Secondary | ICD-10-CM | POA: Diagnosis not present

## 2018-06-20 DIAGNOSIS — O99282 Endocrine, nutritional and metabolic diseases complicating pregnancy, second trimester: Secondary | ICD-10-CM | POA: Insufficient documentation

## 2018-06-20 DIAGNOSIS — N949 Unspecified condition associated with female genital organs and menstrual cycle: Secondary | ICD-10-CM

## 2018-06-20 DIAGNOSIS — F329 Major depressive disorder, single episode, unspecified: Secondary | ICD-10-CM | POA: Diagnosis not present

## 2018-06-20 DIAGNOSIS — Z825 Family history of asthma and other chronic lower respiratory diseases: Secondary | ICD-10-CM | POA: Diagnosis not present

## 2018-06-20 DIAGNOSIS — F17210 Nicotine dependence, cigarettes, uncomplicated: Secondary | ICD-10-CM | POA: Insufficient documentation

## 2018-06-20 DIAGNOSIS — Z3A19 19 weeks gestation of pregnancy: Secondary | ICD-10-CM

## 2018-06-20 DIAGNOSIS — O219 Vomiting of pregnancy, unspecified: Secondary | ICD-10-CM

## 2018-06-20 DIAGNOSIS — Z8249 Family history of ischemic heart disease and other diseases of the circulatory system: Secondary | ICD-10-CM | POA: Insufficient documentation

## 2018-06-20 DIAGNOSIS — Z809 Family history of malignant neoplasm, unspecified: Secondary | ICD-10-CM | POA: Diagnosis not present

## 2018-06-20 DIAGNOSIS — Z833 Family history of diabetes mellitus: Secondary | ICD-10-CM | POA: Insufficient documentation

## 2018-06-20 DIAGNOSIS — R109 Unspecified abdominal pain: Secondary | ICD-10-CM

## 2018-06-20 DIAGNOSIS — E7800 Pure hypercholesterolemia, unspecified: Secondary | ICD-10-CM | POA: Insufficient documentation

## 2018-06-20 DIAGNOSIS — R102 Pelvic and perineal pain: Secondary | ICD-10-CM | POA: Insufficient documentation

## 2018-06-20 DIAGNOSIS — Z3480 Encounter for supervision of other normal pregnancy, unspecified trimester: Secondary | ICD-10-CM

## 2018-06-20 DIAGNOSIS — O26892 Other specified pregnancy related conditions, second trimester: Secondary | ICD-10-CM | POA: Diagnosis not present

## 2018-06-20 DIAGNOSIS — Z79899 Other long term (current) drug therapy: Secondary | ICD-10-CM | POA: Insufficient documentation

## 2018-06-20 DIAGNOSIS — O99342 Other mental disorders complicating pregnancy, second trimester: Secondary | ICD-10-CM | POA: Diagnosis not present

## 2018-06-20 DIAGNOSIS — O99332 Smoking (tobacco) complicating pregnancy, second trimester: Secondary | ICD-10-CM | POA: Diagnosis not present

## 2018-06-20 MED ORDER — FAMOTIDINE-CA CARB-MAG HYDROX 10-800-165 MG PO CHEW: 1.0000 | CHEWABLE_TABLET | Freq: Two times a day (BID) | ORAL | 11 refills | Status: DC | PRN

## 2018-06-20 MED ORDER — LACTATED RINGERS IV BOLUS
1000.0000 mL | Freq: Once | INTRAVENOUS | Status: AC
  Administered 2018-06-20: 1000 mL via INTRAVENOUS

## 2018-06-20 MED ORDER — PROMETHAZINE HCL 25 MG/ML IJ SOLN
25.0000 mg | Freq: Once | INTRAMUSCULAR | Status: AC
  Administered 2018-06-20: 25 mg via INTRAVENOUS
  Filled 2018-06-20: qty 1

## 2018-06-20 MED ORDER — GLYCOPYRROLATE 1 MG PO TABS: 1.0000 mg | ORAL_TABLET | Freq: Three times a day (TID) | ORAL | 3 refills | Status: DC

## 2018-06-20 MED ORDER — SODIUM CHLORIDE 0.9 % IV SOLN: 8.0000 mg | Freq: Once | INTRAVENOUS | Status: DC

## 2018-06-20 MED ORDER — SCOPOLAMINE 1 MG/3DAYS TD PT72
1.0000 | MEDICATED_PATCH | TRANSDERMAL | Status: DC
  Administered 2018-06-20: 1.5 mg via TRANSDERMAL
  Filled 2018-06-20: qty 1

## 2018-06-20 MED ORDER — FAMOTIDINE IN NACL 20-0.9 MG/50ML-% IV SOLN
20.0000 mg | Freq: Once | INTRAVENOUS | Status: AC
  Administered 2018-06-20: 20 mg via INTRAVENOUS
  Filled 2018-06-20: qty 50

## 2018-06-20 MED ORDER — SCOPOLAMINE 1 MG/3DAYS TD PT72: 1.0000 | MEDICATED_PATCH | TRANSDERMAL | 12 refills | Status: DC

## 2018-06-20 MED ORDER — METOCLOPRAMIDE HCL 10 MG PO TABS: 10.0000 mg | ORAL_TABLET | Freq: Three times a day (TID) | ORAL | 2 refills | Status: DC

## 2018-06-20 NOTE — MAU Provider Note (Signed)
History     CSN: [id number]  Arrival date and time: 06/20/18 0037   First Provider Initiated Contact with Patient 06/20/18 0056      Chief Complaint  Patient presents with  . Abdominal Pain  . Emesis During Pregnancy   Katelyn Mcfarland is a 27 y.o. [id number] at [redacted]w[redacted]d who presents today with nausea and vomiting. She states that she "hurts all over". She states that she finished her steroids, and ran out of scop patches yesterday. She states that she vomits any other medications that she tries to take. She reports that she has not been able to eat anything for 4 days.   Emesis   This is a new problem. The current episode started more than 1 month ago. The problem occurs more than 10 times per day. The problem has been unchanged. The emesis has an appearance of stomach contents. There has been no fever. Associated symptoms include arthralgias and myalgias. Risk factors: hyperemesis  Treatments tried: scopalamine patches, zofran, phenergan, pepcid tums. The treatment provided no relief.    OB History    Gravida  6   Para  2   Term  2   Preterm  0   AB  3   Living  2     SAB  2   TAB  1   Ectopic  0   Multiple  0   Live Births  2           Past Medical History:  Diagnosis Date  . Allergy   . Chlamydia    age 61yo; hx/o trichomonas age 28yo  . Chronic headache   . Depression    doing ok, declined meds  . High cholesterol   . Infection    UTI  . Vaginal Pap smear, abnormal    bx, cryo    Past Surgical History:  Procedure Laterality Date  . INDUCED ABORTION    . WISDOM TOOTH EXTRACTION      Family History  Problem Relation Age of Onset  . Hypertension Mother   . Diabetes Mother   . Asthma Sister   . Cancer Paternal Grandmother   . Cancer Paternal Grandfather   . Cancer Maternal Grandmother   . Kidney disease Maternal Grandmother   . Diabetes Maternal Grandmother   . Heart disease Neg Hx   . Stroke Neg Hx     Social History   Tobacco Use  .  Smoking status: Current Some Day Smoker    Packs/day: 0.50    Years: 16.00    Pack years: 8.00    Types: Cigarettes  . Smokeless tobacco: Never Used  . Tobacco comment: quit x 3 wk as of 03/2012=- started back 06/11/18  Substance Use Topics  . Alcohol use: Not Currently    Comment: not currently  . Drug use: No    Types: Marijuana    Comment: last Mar 2018    Allergies: No Known Allergies  Medications Prior to Admission  Medication Sig Dispense Refill Last Dose  . acetaminophen (TYLENOL) 500 MG tablet Take 500 mg by mouth every 6 (six) hours as needed for mild pain.   06/12/2018 at Unknown time  . cyclobenzaprine (FLEXERIL) 10 MG tablet Take 1 tablet (10 mg total) by mouth 2 (two) times daily as needed for muscle spasms. 20 tablet 0   . docusate sodium (COLACE) 100 MG capsule Take 1 capsule (100 mg total) by mouth every 12 (twelve) hours. 60 capsule 0 Taking  . Elastic  Bandages & Supports (COMFORT FIT MATERNITY SUPP SM) MISC Wear as directed. 1 each 0 Past Month at Unknown time  . famotidine (PEPCID) 20 MG tablet Take 1 tablet (20 mg total) by mouth 2 (two) times daily. 60 tablet 3 06/11/2018 at Unknown time  . methylPREDNISolone (MEDROL) 4 MG tablet See Steroid Taper instructions 70 tablet 0 Taking  . metoCLOPramide (REGLAN) 10 MG tablet Take 1 tablet (10 mg total) by mouth every 6 (six) hours for 14 days. After 14 days, take every six hours as needed. 30 tablet 2 Taking  . ondansetron (ZOFRAN-ODT) 4 MG disintegrating tablet Take 1 tablet (4 mg total) by mouth every 8 (eight) hours. Take q8h x 14 days and then do q8h prn after that 40 tablet 0 Past Week at Unknown time  . polyethylene glycol (MIRALAX / GLYCOLAX) packet Take 17 g by mouth daily. 14 each 0 More than a month at Unknown time  . Prenat-Fe Poly-Methfol-FA-DHA (VITAFOL ULTRA) 29-0.6-0.4-200 MG CAPS Take 1 tablet by mouth daily before breakfast. 90 capsule 4 Taking  . promethazine (PHENERGAN) 25 MG tablet Take 0.5-1 tablets  (12.5-25 mg total) by mouth every 6 (six) hours as needed. 30 tablet 5 Past Week at Unknown time  . pyridOXINE (VITAMIN B-6) 25 MG tablet Take 1 tablet (25 mg total) by mouth 2 (two) times daily. 60 tablet 1 Taking  . ranitidine (ZANTAC) 150 MG tablet Take 1 tablet (150 mg total) by mouth 2 (two) times daily. 60 tablet 5 Past Week at Unknown time  . scopolamine (TRANSDERM-SCOP) 1 MG/3DAYS Place 1 patch (1.5 mg total) onto the skin every 3 (three) days. 10 patch 1 Past Week at Unknown time  . sertraline (ZOLOFT) 50 MG tablet Take 1 tablet (50 mg total) by mouth daily. 30 tablet 3 More than a month at Unknown time    Review of Systems  Gastrointestinal: Positive for vomiting.  Musculoskeletal: Positive for arthralgias and myalgias.   Physical Exam   Blood pressure 115/71, pulse (!) 101, temperature 98.5 F (36.9 C), resp. rate 18, height 5' (1.524 m), weight 81.6 kg, last menstrual period 02/07/2018, unknown if currently breastfeeding.  Physical Exam  Nursing note and vitals reviewed. Constitutional: She is oriented to person, place, and time. She appears well-developed and well-nourished. No distress.  HENT:  Head: Normocephalic.  Cardiovascular: Normal rate.  Respiratory: Effort normal.  GI: Soft. There is no tenderness. There is no rebound.  Genitourinary:  Genitourinary Comments: FHT 160 with doppler   Neurological: She is alert and oriented to person, place, and time.  Skin: Skin is warm and dry.  Psychiatric: She has a normal mood and affect.     MAU Course  Procedures  MDM Patient has had 2L of LR, phenergan, scop patch applied and pepcid IV. She has not vomited since arriving here. She is tolerating PO. She is resting comfortably without any pain. Patient DC home with new rxs for her to have for meds.   Assessment and Plan   1. Nausea and vomiting during pregnancy   2. Supervision of other normal pregnancy, antepartum   3. [redacted] weeks gestation of pregnancy   4. Round  ligament pain    DC home Comfort measures reviewed  2nd/3rd Trimester precautions  Bleeding precautions Ectopic precautions PTL precautions  Fetal kick counts RX: Reglan TIC ac and HS #120 with 1 RF, Scopolamine patch q 3 days #10 with 12 RF, robinul TID #90,  Return to MAU as needed FU with OB as  planned  Follow-up Information    [facility] Follow up.   Contact information: [address] [place] [address] [phone]           Thressa Sheller 06/20/2018, 4:04 AM

## 2018-06-20 NOTE — MAU Note (Addendum)
Nausea and vomiting entire pregnancy. Hurt all over and tired. Pee on myself when I throw up and urine dark. Having abd and back pain. Pain worse with movement. Have not kept down anything for 3 days. Denies vag bleeding or d/c.

## 2018-06-20 NOTE — Discharge Instructions (Signed)
Eating Plan for Hyperemesis Gravidarum Hyperemesis gravidarum is a severe form of morning sickness. Because this condition causes severe nausea and vomiting, it can lead to dehydration, malnutrition, and weight loss. One way to lessen the symptoms of nausea and vomiting is to follow the eating plan for hyperemesis gravidarum. It is often used along with prescribed medicines to control your symptoms. What can I do to relieve my symptoms? Listen to your body. Everyone is different and has different preferences. Find what works best for you. Take any of the following actions that are helpful to you:  Eat and drink slowly.  Eat 5-6 small meals daily instead of 3 large meals.  Eat crackers before you get out of bed in the morning.  Try having a snack in the middle of the night.  Starchy foods are usually tolerated well. Examples include cereal, toast, bread, potatoes, pasta, rice, and pretzels.  Ginger may help with nausea. Add  tsp ground ginger to hot tea or choose ginger tea.  Try drinking 100% fruit juice or an electrolyte drink. An electrolyte drink contains sodium, potassium, and chloride.  Continue to take your prenatal vitamins as told by your health care provider. If you are having trouble taking your prenatal vitamins, talk with your health care provider about different options.  Include at least 1 serving of protein with your meals and snacks. Protein options include meats or poultry, beans, nuts, eggs, and yogurt. Try eating a protein-rich snack before bed. Examples of these snacks include cheese and crackers or half of a peanut butter or [place] sandwich.  Consider eliminating foods that trigger your symptoms. These may include spicy foods, coffee, high-fat foods, very sweet foods, and acidic foods.  Try meals that have more protein combined with bland, salty, lower-fat, and dry foods, such as nuts, seeds, pretzels, crackers, and cereal.  Talk with your healthcare provider about  starting a supplement of vitamin B6.  Have fluids that are cold, clear, and carbonated or sour. Examples include lemonade, ginger ale, lemon-lime soda, ice water, and sparkling water.  Try lemon or mint tea.  Try brushing your teeth or using a mouth rinse after meals.  What should I avoid to reduce my symptoms? Avoiding some of the following things may help reduce your symptoms.  Foods with strong smells. Try eating meals in well-ventilated areas that are free of odors.  Drinking water or other beverages with meals. Try not to drink anything during the 30 minutes before and after your meals.  Drinking more than 1 cup of fluid at a time. Sometimes using a straw helps.  Fried or high-fat foods, such as butter and cream sauces.  Spicy foods.  Skipping meals as best as you can. Nausea can be more intense on an empty stomach. If you cannot tolerate food at that time, do not force it. Try sucking on ice chips or other frozen items, and make up for missed calories later.  Lying down within 2 hours after eating.  Environmental triggers. These may include smoky rooms, closed spaces, rooms with strong smells, warm or humid places, overly loud and noisy rooms, and rooms with motion or flickering lights.  Quick and sudden changes in your movement.  This information is not intended to replace advice given to you by your health care provider. Make sure you discuss any questions you have with your health care provider. Document Released: 07/27/2007 Document Revised: 05/28/2016 Document Reviewed: 04/29/2016 Elsevier Interactive Patient Education  2018 [employer].  Round Ligament Pain  during Pregnancy Many women will experience a type of pain referred to as round ligament pain during their pregnancy. This is associated with abdominal pain or discomfort. Since any type of abdominal pain during pregnancy can be disconcerting, it is important to talk about round ligament pain to relieve any  anxiety or fears you may have regarding the symptoms you are feeling. Round ligament pain is due to normal changes that take place in the body during pregnancy. It is caused by stretching of the round ligaments attached to the uterus. More commonly it occurs on the right side of the pelvis. Round Ligament: An Overview Typically in the non-pregnant state the uterus is about the size of an apple or pear. There are thick ligaments which hold the uterus in place in the abdomen, referred to as round ligaments. During pregnancy, your uterus will expand in size and weight, and the ligaments supporting it will have to stretch, becoming longer and thinner. As these ligaments pull and tug they may irritate nearby nerve fibers, which causes pain. The severity of the pain in some cases can seem extreme. Some common symptoms of round ligament pain include:  Ligament spasms or contractions/cramps that trigger a sharp pain typically on the right side of the abdomen.  Pain upon waking or suddenly rolling over in your sleep.  Pain in the abdomen that is sharp brought on by exercise or other vigorous activity. Similar Problems Round ligament pain is often mistaken for other medical conditions because the symptoms are similar. Acute abdominal pain during pregnancy may also be a sign of other conditions including:  Abdominal cramps - Some abdominal pain is simply caused by change in bowel habits associated with pregnancy. Gas is a common problem that can cause sharp, shooting pain. You should always seek out medical care if your pain is accompanied by fever, chills, pain upon urination or if you have difficulty walking. Further exams and tests will be conducted to ensure that you do not have a more serious condition. It is not uncommon for women with lower abdominal pain to have a urinary tract infection, thus you may also be asked for a urine sample. Treatment If all other conditions are ruled out you  can treat your round ligament pain relatively easily. You may be advised to take some acetaminophen (Tylenol) to reduce the severity of any persistent pain and asked to reduce your activity level. You can apply a heating pad to the area of pain or take a warm bath. Lying on the opposite side of the pain may help as well. Most women will find relief from round ligament pain simply by altering their daily routines slightly. The good news is round ligament pain will disappear completely once you have given birth to your child!

## 2018-06-20 NOTE — Progress Notes (Signed)
Written and verbal d/c instructions given and understanding voiced. 

## 2018-06-22 ENCOUNTER — Encounter: Payer: Self-pay | Admitting: Obstetrics and Gynecology

## 2018-06-25 ENCOUNTER — Other Ambulatory Visit: Payer: Self-pay

## 2018-06-25 ENCOUNTER — Ambulatory Visit (HOSPITAL_COMMUNITY)
Admission: RE | Admit: 2018-06-25 | Discharge: 2018-06-25 | Disposition: A | Discharge status: Home / Self Care | Payer: Medicaid Other | Source: Ambulatory Visit | Attending: Obstetrics & Gynecology | Admitting: Obstetrics & Gynecology

## 2018-06-25 ENCOUNTER — Encounter (HOSPITAL_COMMUNITY): Payer: Self-pay | Admitting: *Deleted

## 2018-06-25 ENCOUNTER — Other Ambulatory Visit: Payer: Self-pay | Admitting: Obstetrics & Gynecology

## 2018-06-25 ENCOUNTER — Inpatient Hospital Stay (HOSPITAL_COMMUNITY)
Admission: AD | Admit: 2018-06-25 | Discharge: 2018-06-25 | Disposition: A | Discharge status: Home / Self Care | Payer: Medicaid Other | Source: Ambulatory Visit | Attending: Obstetrics & Gynecology | Admitting: Obstetrics & Gynecology

## 2018-06-25 DIAGNOSIS — F329 Major depressive disorder, single episode, unspecified: Secondary | ICD-10-CM | POA: Diagnosis not present

## 2018-06-25 DIAGNOSIS — O99212 Obesity complicating pregnancy, second trimester: Secondary | ICD-10-CM

## 2018-06-25 DIAGNOSIS — F17210 Nicotine dependence, cigarettes, uncomplicated: Secondary | ICD-10-CM | POA: Diagnosis not present

## 2018-06-25 DIAGNOSIS — R112 Nausea with vomiting, unspecified: Secondary | ICD-10-CM | POA: Insufficient documentation

## 2018-06-25 DIAGNOSIS — Z825 Family history of asthma and other chronic lower respiratory diseases: Secondary | ICD-10-CM | POA: Insufficient documentation

## 2018-06-25 DIAGNOSIS — Z8249 Family history of ischemic heart disease and other diseases of the circulatory system: Secondary | ICD-10-CM | POA: Insufficient documentation

## 2018-06-25 DIAGNOSIS — Z363 Encounter for antenatal screening for malformations: Secondary | ICD-10-CM | POA: Diagnosis not present

## 2018-06-25 DIAGNOSIS — Z3480 Encounter for supervision of other normal pregnancy, unspecified trimester: Secondary | ICD-10-CM

## 2018-06-25 DIAGNOSIS — O99332 Smoking (tobacco) complicating pregnancy, second trimester: Secondary | ICD-10-CM | POA: Diagnosis not present

## 2018-06-25 DIAGNOSIS — Z79899 Other long term (current) drug therapy: Secondary | ICD-10-CM | POA: Insufficient documentation

## 2018-06-25 DIAGNOSIS — O99342 Other mental disorders complicating pregnancy, second trimester: Secondary | ICD-10-CM | POA: Insufficient documentation

## 2018-06-25 DIAGNOSIS — Z3A19 19 weeks gestation of pregnancy: Secondary | ICD-10-CM | POA: Insufficient documentation

## 2018-06-25 DIAGNOSIS — Z3689 Encounter for other specified antenatal screening: Secondary | ICD-10-CM | POA: Insufficient documentation

## 2018-06-25 DIAGNOSIS — Z7952 Long term (current) use of systemic steroids: Secondary | ICD-10-CM | POA: Insufficient documentation

## 2018-06-25 DIAGNOSIS — O26892 Other specified pregnancy related conditions, second trimester: Secondary | ICD-10-CM | POA: Diagnosis not present

## 2018-06-25 DIAGNOSIS — O9A212 Injury, poisoning and certain other consequences of external causes complicating pregnancy, second trimester: Secondary | ICD-10-CM

## 2018-06-25 DIAGNOSIS — Z833 Family history of diabetes mellitus: Secondary | ICD-10-CM | POA: Insufficient documentation

## 2018-06-25 DIAGNOSIS — O219 Vomiting of pregnancy, unspecified: Secondary | ICD-10-CM | POA: Diagnosis not present

## 2018-06-25 DIAGNOSIS — Z841 Family history of disorders of kidney and ureter: Secondary | ICD-10-CM | POA: Diagnosis not present

## 2018-06-25 LAB — URINALYSIS, ROUTINE W REFLEX MICROSCOPIC
Bilirubin Urine: NEGATIVE
Glucose, UA: NEGATIVE mg/dL
Hgb urine dipstick: NEGATIVE
Ketones, ur: 80 mg/dL — AB
Leukocytes, UA: NEGATIVE
Nitrite: NEGATIVE
Protein, ur: 100 mg/dL — AB
Specific Gravity, Urine: 1.02 (ref 1.005–1.030)
pH: 6 (ref 5.0–8.0)

## 2018-06-25 MED ORDER — PROMETHAZINE HCL 25 MG RE SUPP
25.0000 mg | Freq: Once | RECTAL | Status: AC
  Administered 2018-06-25: 25 mg via RECTAL
  Filled 2018-06-25: qty 1

## 2018-06-25 MED ORDER — PROMETHAZINE HCL 25 MG RE SUPP: 25.0000 mg | Freq: Four times a day (QID) | RECTAL | 3 refills | Status: DC | PRN

## 2018-06-25 NOTE — MAU Provider Note (Signed)
History     CSN: [id number]  Arrival date and time: 06/25/18 4098   First Provider Initiated Contact with Patient 06/25/18 0759      Chief Complaint  Patient presents with  . Nausea  . Emesis   Katelyn Mcfarland is a 27 y.o. [id number] at [redacted]w[redacted]d who presents today with nausea and vomiting. She has not filled her scopalamine patch rx yet. She has not take any meds since yesterday because "they don't work anyway". She states that she is waiting one week to take zofran again because it makes her constipated.  Emesis   This is a new problem. The current episode started more than 1 month ago. The problem occurs 5 to 10 times per day. The problem has been unchanged. The emesis has an appearance of stomach contents. There has been no fever. Pertinent negatives include no chills, diarrhea or fever. Risk factors: pregnancy  She has tried nothing for the symptoms.    OB History    Gravida  6   Para  2   Term  2   Preterm  0   AB  3   Living  2     SAB  2   TAB  1   Ectopic  0   Multiple  0   Live Births  2           Past Medical History:  Diagnosis Date  . Allergy   . Chlamydia    age 12yo; hx/o trichomonas age 17yo  . Chronic headache   . Depression    doing ok, declined meds  . High cholesterol   . Infection    UTI  . Vaginal Pap smear, abnormal    bx, cryo    Past Surgical History:  Procedure Laterality Date  . INDUCED ABORTION    . WISDOM TOOTH EXTRACTION      Family History  Problem Relation Age of Onset  . Hypertension Mother   . Diabetes Mother   . Asthma Sister   . Cancer Paternal Grandmother   . Cancer Paternal Grandfather   . Cancer Maternal Grandmother   . Kidney disease Maternal Grandmother   . Diabetes Maternal Grandmother   . Heart disease Neg Hx   . Stroke Neg Hx     Social History   Tobacco Use  . Smoking status: Current Some Day Smoker    Packs/day: 0.50    Years: 16.00    Pack years: 8.00    Types: Cigarettes  . Smokeless  tobacco: Never Used  . Tobacco comment: quit x 3 wk as of 03/2012=- started back 06/11/18  Substance Use Topics  . Alcohol use: Not Currently    Comment: not currently  . Drug use: No    Types: Marijuana    Comment: last Mar 2018    Allergies: No Known Allergies  Medications Prior to Admission  Medication Sig Dispense Refill Last Dose  . acetaminophen (TYLENOL) 500 MG tablet Take 500 mg by mouth every 6 (six) hours as needed for mild pain.   06/12/2018 at Unknown time  . cyclobenzaprine (FLEXERIL) 10 MG tablet Take 1 tablet (10 mg total) by mouth 2 (two) times daily as needed for muscle spasms. 20 tablet 0   . docusate sodium (COLACE) 100 MG capsule Take 1 capsule (100 mg total) by mouth every 12 (twelve) hours. 60 capsule 0 Taking  . Elastic Bandages & Supports (COMFORT FIT MATERNITY SUPP SM) MISC Wear as directed. 1 each 0 Past Month  at Unknown time  . famotidine-calcium carbonate-magnesium hydroxide (PEPCID COMPLETE) 10-800-165 MG chewable tablet Chew 1 tablet by mouth 2 (two) times daily as needed. 60 tablet 11   . glycopyrrolate (ROBINUL) 1 MG tablet Take 1 tablet (1 mg total) by mouth 3 (three) times daily. 90 tablet 3   . methylPREDNISolone (MEDROL) 4 MG tablet See Steroid Taper instructions 70 tablet 0 Taking  . metoCLOPramide (REGLAN) 10 MG tablet Take 1 tablet (10 mg total) by mouth 4 (four) times daily -  before meals and at bedtime. 120 tablet 2   . ondansetron (ZOFRAN-ODT) 4 MG disintegrating tablet Take 1 tablet (4 mg total) by mouth every 8 (eight) hours. Take q8h x 14 days and then do q8h prn after that 40 tablet 0 Past Week at Unknown time  . polyethylene glycol (MIRALAX / GLYCOLAX) packet Take 17 g by mouth daily. 14 each 0 More than a month at Unknown time  . Prenat-Fe Poly-Methfol-FA-DHA (VITAFOL ULTRA) 29-0.6-0.4-200 MG CAPS Take 1 tablet by mouth daily before breakfast. 90 capsule 4 Taking  . promethazine (PHENERGAN) 25 MG tablet Take 0.5-1 tablets (12.5-25 mg total) by  mouth every 6 (six) hours as needed. 30 tablet 5 Past Week at Unknown time  . pyridOXINE (VITAMIN B-6) 25 MG tablet Take 1 tablet (25 mg total) by mouth 2 (two) times daily. 60 tablet 1 Taking  . ranitidine (ZANTAC) 150 MG tablet Take 1 tablet (150 mg total) by mouth 2 (two) times daily. 60 tablet 5 Past Week at Unknown time  . scopolamine (TRANSDERM-SCOP, 1.5 MG,) 1 MG/3DAYS Place 1 patch (1.5 mg total) onto the skin every 3 (three) days. 10 patch 12   . sertraline (ZOLOFT) 50 MG tablet Take 1 tablet (50 mg total) by mouth daily. 30 tablet 3 More than a month at Unknown time    Review of Systems  Constitutional: Negative for chills and fever.  Gastrointestinal: Positive for nausea and vomiting. Negative for constipation and diarrhea.  Genitourinary: Negative for dysuria, frequency, vaginal bleeding and vaginal discharge.   Physical Exam   Blood pressure 126/74, pulse 98, temperature 98.8 F (37.1 C), temperature source Oral, resp. rate 18, height 5' (1.524 m), weight 85 kg, last menstrual period 02/07/2018, SpO2 100 %, unknown if currently breastfeeding.  Physical Exam  Nursing note and vitals reviewed. Constitutional: She is oriented to person, place, and time. She appears well-developed and well-nourished. No distress.  HENT:  Head: Normocephalic.  Cardiovascular: Normal rate.  Respiratory: Effort normal.  GI: Soft. There is no tenderness. There is no rebound.  Genitourinary:  Genitourinary Comments: FHT 145 with doppler   Neurological: She is alert and oriented to person, place, and time.  Skin: Skin is warm.  Psychiatric: She has a normal mood and affect.   Results for orders placed or performed during the hospital encounter of 06/25/18 (from the past 24 hour(s))  Urinalysis, Routine w reflex microscopic     Status: Abnormal   Collection Time: 06/25/18  8:04 AM  Result Value Ref Range   Color, Urine AMBER (A) YELLOW   APPearance HAZY (A) CLEAR   Specific Gravity, Urine 1.020  1.005 - 1.030   pH 6.0 5.0 - 8.0   Glucose, UA NEGATIVE NEGATIVE mg/dL   Hgb urine dipstick NEGATIVE NEGATIVE   Bilirubin Urine NEGATIVE NEGATIVE   Ketones, ur 80 (A) NEGATIVE mg/dL   Protein, ur 324100 (A) NEGATIVE mg/dL   Nitrite NEGATIVE NEGATIVE   Leukocytes, UA NEGATIVE NEGATIVE   RBC /  HPF 0-5 0 - 5 RBC/hpf   WBC, UA 0-5 0 - 5 WBC/hpf   Bacteria, UA RARE (A) NONE SEEN   Squamous Epithelial / LPF 6-10 0 - 5   Mucus PRESENT    Hyaline Casts, UA PRESENT     MAU Course  Procedures  MDM Patient has had phenergan suppository. She has not vomited since being here. She is tolerating PO.  Assessment and Plan   1. Nausea/vomiting in pregnancy   2. Traumatic injury during pregnancy in second trimester   3. [redacted] weeks gestation of pregnancy    DC home Comfort measures reviewed  2nd Trimester precautions  RX: phenergan suppositories prescribed today. Patient encouraged to take meds and pick up scopolamine patches.  Return to MAU as needed FU with OB as planned  Follow-up Information    [facility] Follow up.   Contact information: [address] [place] [address] [phone]           Thressa Sheller 06/25/2018, 8:13 AM

## 2018-06-25 NOTE — MAU Note (Signed)
Pt presents with c/o N&V.  Reports unable to keep anything down.  Unsure of how times she's vomited in 24 hours.

## 2018-06-25 NOTE — Discharge Instructions (Signed)
Morning Sickness °Morning sickness is when you feel sick to your stomach (nauseous) during pregnancy. This nauseous feeling may or may not come with vomiting. It often occurs in the morning but can be a problem any time of day. Morning sickness is most common during the first trimester, but it may continue throughout pregnancy. While morning sickness is unpleasant, it is usually harmless unless you develop severe and continual vomiting (hyperemesis gravidarum). This condition requires more intense treatment. °What are the causes? °The cause of morning sickness is not completely known but seems to be related to normal hormonal changes that occur in pregnancy. °What increases the risk? °You are at greater risk if you: °· Experienced nausea or vomiting before your pregnancy. °· Had morning sickness during a previous pregnancy. °· Are pregnant with more than one baby, such as twins. ° °How is this treated? °Do not use any medicines (prescription, over-the-counter, or herbal) for morning sickness without first talking to your health care provider. Your health care provider may prescribe or recommend: °· Vitamin B6 supplements. °· Anti-nausea medicines. °· The herbal medicine ginger. ° °Follow these instructions at home: °· Only take over-the-counter or prescription medicines as directed by your health care provider. °· Taking multivitamins before getting pregnant can prevent or decrease the severity of morning sickness in most women. °· Eat a piece of dry toast or unsalted crackers before getting out of bed in the morning. °· Eat five or six small meals a day. °· Eat dry and bland foods (rice, baked potato). Foods high in carbohydrates are often helpful. °· Do not drink liquids with your meals. Drink liquids between meals. °· Avoid greasy, fatty, and spicy foods. °· Get someone to cook for you if the smell of any food causes nausea and vomiting. °· If you feel nauseous after taking prenatal vitamins, take the vitamins at  night or with a snack. °· Snack on protein foods (nuts, yogurt, cheese) between meals if you are hungry. °· Eat unsweetened gelatins for desserts. °· Wearing an acupressure wristband (worn for sea sickness) may be helpful. °· Acupuncture may be helpful. °· Do not smoke. °· Get a humidifier to keep the air in your house free of odors. °· Get plenty of fresh air. °Contact a health care provider if: °· Your home remedies are not working, and you need medicine. °· You feel dizzy or lightheaded. °· You are losing weight. °Get help right away if: °· You have persistent and uncontrolled nausea and vomiting. °· You pass out (faint). °This information is not intended to replace advice given to you by your health care provider. Make sure you discuss any questions you have with your health care provider. °Document Released: 11/20/2006 Document Revised: 03/06/2016 Document Reviewed: 03/16/2013 °Elsevier Interactive Patient Education © 2017 [employer] ° °

## 2018-06-25 NOTE — MAU Note (Signed)
Patient unable to stand for 2nd orthostatic vitals.

## 2018-06-28 ENCOUNTER — Ambulatory Visit (INDEPENDENT_AMBULATORY_CARE_PROVIDER_SITE_OTHER): Payer: Medicaid Other | Admitting: Obstetrics and Gynecology

## 2018-06-28 ENCOUNTER — Encounter: Payer: Self-pay | Admitting: Obstetrics and Gynecology

## 2018-06-28 VITALS — BP 106/73 | HR 93 | Wt 185.8 lb

## 2018-06-28 DIAGNOSIS — Z3480 Encounter for supervision of other normal pregnancy, unspecified trimester: Secondary | ICD-10-CM

## 2018-06-28 DIAGNOSIS — Z3482 Encounter for supervision of other normal pregnancy, second trimester: Secondary | ICD-10-CM

## 2018-06-28 DIAGNOSIS — D573 Sickle-cell trait: Secondary | ICD-10-CM

## 2018-06-28 DIAGNOSIS — R8271 Bacteriuria: Secondary | ICD-10-CM

## 2018-06-28 DIAGNOSIS — O219 Vomiting of pregnancy, unspecified: Secondary | ICD-10-CM

## 2018-06-28 NOTE — Progress Notes (Signed)
Pt is here for ROB. 

## 2018-06-28 NOTE — Patient Instructions (Addendum)
Second Trimester of Pregnancy The second trimester is from week 14 through week 27 (months 4 through 6). The second trimester is often a time when you feel your best. Your body has adjusted to being pregnant, and you begin to feel better physically. Usually, morning sickness has lessened or quit completely, you may have more energy, and you may have an increase in appetite. The second trimester is also a time when the fetus is growing rapidly. At the end of the sixth month, the fetus is about 9 inches long and weighs about 1 pounds. You will likely begin to feel the baby move (quickening) between 16 and 20 weeks of pregnancy. Body changes during your second trimester Your body continues to go through many changes during your second trimester. The changes vary from woman to woman.  Your weight will continue to increase. You will notice your lower abdomen bulging out.  You may begin to get stretch marks on your hips, abdomen, and breasts.  You may develop headaches that can be relieved by medicines. The medicines should be approved by your health care provider.  You may urinate more often because the fetus is pressing on your bladder.  You may develop or continue to have heartburn as a result of your pregnancy.  You may develop constipation because certain hormones are causing the muscles that push waste through your intestines to slow down.  You may develop hemorrhoids or swollen, bulging veins (varicose veins).  You may have back pain. This is caused by: ? Weight gain. ? Pregnancy hormones that are relaxing the joints in your pelvis. ? A shift in weight and the muscles that support your balance.  Your breasts will continue to grow and they will continue to become tender.  Your gums may bleed and may be sensitive to brushing and flossing.  Dark spots or blotches (chloasma, mask of pregnancy) may develop on your face. This will likely fade after the baby is born.  A dark line from your  belly button to the pubic area (linea nigra) may appear. This will likely fade after the baby is born.  You may have changes in your hair. These can include thickening of your hair, rapid growth, and changes in texture. Some women also have hair loss during or after pregnancy, or hair that feels dry or thin. Your hair will most likely return to normal after your baby is born.  What to expect at prenatal visits During a routine prenatal visit:  You will be weighed to make sure you and the fetus are growing normally.  Your blood pressure will be taken.  Your abdomen will be measured to track your baby's growth.  The fetal heartbeat will be listened to.  Any test results from the previous visit will be discussed.  Your health care provider may ask you:  How you are feeling.  If you are feeling the baby move.  If you have had any abnormal symptoms, such as leaking fluid, bleeding, severe headaches, or abdominal cramping.  If you are using any tobacco products, including cigarettes, chewing tobacco, and electronic cigarettes.  If you have any questions.  Other tests that may be performed during your second trimester include:  Blood tests that check for: ? Low iron levels (anemia). ? High blood sugar that affects pregnant women (gestational diabetes) between 24 and 28 weeks. ? Rh antibodies. This is to check for a protein on red blood cells (Rh factor).  Urine tests to check for infections, diabetes, or   protein in the urine.  An ultrasound to confirm the proper growth and development of the baby.  An amniocentesis to check for possible genetic problems.  Fetal screens for spina bifida and Down syndrome.  HIV (human immunodeficiency virus) testing. Routine prenatal testing includes screening for HIV, unless you choose not to have this test.  Follow these instructions at home: Medicines  Follow your health care provider's instructions regarding medicine use. Specific  medicines may be either safe or unsafe to take during pregnancy.  Take a prenatal vitamin that contains at least 600 micrograms (mcg) of folic acid.  If you develop constipation, try taking a stool softener if your health care provider approves. Eating and drinking  Eat a balanced diet that includes fresh fruits and vegetables, whole grains, good sources of protein such as meat, eggs, or tofu, and low-fat dairy. Your health care provider will help you determine the amount of weight gain that is right for you.  Avoid raw meat and uncooked cheese. These carry germs that can cause birth defects in the baby.  If you have low calcium intake from food, talk to your health care provider about whether you should take a daily calcium supplement.  Limit foods that are high in fat and processed sugars, such as fried and sweet foods.  To prevent constipation: ? Drink enough fluid to keep your urine clear or pale yellow. ? Eat foods that are high in fiber, such as fresh fruits and vegetables, whole grains, and beans. Activity  Exercise only as directed by your health care provider. Most women can continue their usual exercise routine during pregnancy. Try to exercise for 30 minutes at least 5 days a week. Stop exercising if you experience uterine contractions.  Avoid heavy lifting, wear low heel shoes, and practice good posture.  A sexual relationship may be continued unless your health care provider directs you otherwise. Relieving pain and discomfort  Wear a good support bra to prevent discomfort from breast tenderness.  Take warm sitz baths to soothe any pain or discomfort caused by hemorrhoids. Use hemorrhoid cream if your health care provider approves.  Rest with your legs elevated if you have leg cramps or low back pain.  If you develop varicose veins, wear support hose. Elevate your feet for 15 minutes, 3-4 times a day. Limit salt in your diet. Prenatal Care  Write down your questions.  Take them to your prenatal visits.  Keep all your prenatal visits as told by your health care provider. This is important. Safety  Wear your seat belt at all times when driving.  Make a list of emergency phone numbers, including numbers for family, friends, the hospital, and police and fire departments. General instructions  Ask your health care provider for a referral to a local prenatal education class. Begin classes no later than the beginning of month 6 of your pregnancy.  Ask for help if you have counseling or nutritional needs during pregnancy. Your health care provider can offer advice or refer you to specialists for help with various needs.  Do not use hot tubs, steam rooms, or saunas.  Do not douche or use tampons or scented sanitary pads.  Do not cross your legs for long periods of time.  Avoid cat litter boxes and soil used by cats. These carry germs that can cause birth defects in the baby and possibly loss of the fetus by miscarriage or stillbirth.  Avoid all smoking, herbs, alcohol, and unprescribed drugs. Chemicals in these products can   affect the formation and growth of the baby.  Do not use any products that contain nicotine or tobacco, such as cigarettes and e-cigarettes. If you need help quitting, ask your health care provider.  Visit your dentist if you have not gone yet during your pregnancy. Use a soft toothbrush to brush your teeth and be gentle when you floss. Contact a health care provider if:  You have dizziness.  You have mild pelvic cramps, pelvic pressure, or nagging pain in the abdominal area.  You have persistent nausea, vomiting, or diarrhea.  You have a bad smelling vaginal discharge.  You have pain when you urinate. Get help right away if:  You have a fever.  You are leaking fluid from your vagina.  You have spotting or bleeding from your vagina.  You have severe abdominal cramping or pain.  You have rapid weight gain or weight  loss.  You have shortness of breath with chest pain.  You notice sudden or extreme swelling of your face, hands, ankles, feet, or legs.  You have not felt your baby move in over an hour.  You have severe headaches that do not go away when you take medicine.  You have vision changes. Summary  The second trimester is from week 14 through week 27 (months 4 through 6). It is also a time when the fetus is growing rapidly.  Your body goes through many changes during pregnancy. The changes vary from woman to woman.  Avoid all smoking, herbs, alcohol, and unprescribed drugs. These chemicals affect the formation and growth your baby.  Do not use any tobacco products, such as cigarettes, chewing tobacco, and e-cigarettes. If you need help quitting, ask your health care provider.  Contact your health care provider if you have any questions. Keep all prenatal visits as told by your health care provider. This is important. This information is not intended to replace advice given to you by your health care provider. Make sure you discuss any questions you have with your health care provider. Document Released: 09/23/2001 Document Revised: 11/04/2016 Document Reviewed: 11/04/2016 Elsevier Interactive Patient Education  2018 [employer].  Morning Sickness Morning sickness is when you feel sick to your stomach (nauseous) during pregnancy. This nauseous feeling may or may not come with vomiting. It often occurs in the morning but can be a problem any time of day. Morning sickness is most common during the first trimester, but it may continue throughout pregnancy. While morning sickness is unpleasant, it is usually harmless unless you develop severe and continual vomiting (hyperemesis gravidarum). This condition requires more intense treatment. What are the causes? The cause of morning sickness is not completely known but seems to be related to normal hormonal changes that occur in pregnancy. What  increases the risk? You are at greater risk if you:  Experienced nausea or vomiting before your pregnancy.  Had morning sickness during a previous pregnancy.  Are pregnant with more than one baby, such as twins.  How is this treated? Do not use any medicines (prescription, over-the-counter, or herbal) for morning sickness without first talking to your health care provider. Your health care provider may prescribe or recommend:  Vitamin B6 supplements.  Anti-nausea medicines.  The herbal medicine ginger.  Follow these instructions at home:  Only take over-the-counter or prescription medicines as directed by your health care provider.  Taking multivitamins before getting pregnant can prevent or decrease the severity of morning sickness in most women.  Eat a piece of dry toast  or unsalted crackers before getting out of bed in the morning.  Eat five or six small meals a day.  Eat dry and bland foods (rice, baked potato). Foods high in carbohydrates are often helpful.  Do not drink liquids with your meals. Drink liquids between meals.  Avoid greasy, fatty, and spicy foods.  Get someone to cook for you if the smell of any food causes nausea and vomiting.  If you feel nauseous after taking prenatal vitamins, take the vitamins at night or with a snack.  Snack on protein foods (nuts, yogurt, cheese) between meals if you are hungry.  Eat unsweetened gelatins for desserts.  Wearing an acupressure wristband (worn for sea sickness) may be helpful.  Acupuncture may be helpful.  Do not smoke.  Get a humidifier to keep the air in your house free of odors.  Get plenty of fresh air. Contact a health care provider if:  Your home remedies are not working, and you need medicine.  You feel dizzy or lightheaded.  You are losing weight. Get help right away if:  You have persistent and uncontrolled nausea and vomiting.  You pass out (faint). This information is not intended to  replace advice given to you by your health care provider. Make sure you discuss any questions you have with your health care provider. Document Released: 11/20/2006 Document Revised: 03/06/2016 Document Reviewed: 03/16/2013 Elsevier Interactive Patient Education  2017 [employer]

## 2018-06-28 NOTE — Progress Notes (Signed)
Subjective:  Katelyn Mcfarland is a 27 y.o. [id number] at 5040w1d being seen today for ongoing prenatal care.  She is currently monitored for the following issues for this low-risk pregnancy and has Current smoker; Supervision of normal pregnancy, antepartum; GBS bacteriuria; Nausea and vomiting during pregnancy prior to [redacted] weeks gestation; Sickle cell trait (HCC); BMI 35-39; and Obesity in pregnancy on their problem list.  Patient reports N/V. Has been seen in MAU 2 X's since last office visit. 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 Contractions: Not present. Vag. Bleeding: None.  Movement: Present. Denies leaking of fluid.   The following portions of the patient's history were reviewed and updated as appropriate: allergies, current medications, past family history, past medical history, past social history, past surgical history and problem list. Problem list updated.  Objective:   Vitals:   06/28/18 0920  BP: 106/73  Pulse: 93  Weight: 84.3 kg    Fetal Status: Fetal Heart Rate (bpm): 159   Movement: Present     General:  Alert, oriented and cooperative. Patient is in no acute distress.  Skin: Skin is warm and dry. No rash noted.   Cardiovascular: Normal heart rate noted  Respiratory: Normal respiratory effort, no problems with respiration noted  Abdomen: Soft, gravid, appropriate for gestational age. Pain/Pressure: Present     Pelvic:  Cervical exam deferred        Extremities: Normal range of motion.  Edema: None  Mental Status: Normal mood and affect. Normal behavior. Normal judgment and thought content.   Urinalysis:      Assessment and Plan:  Pregnancy: [id number] at 5040w1d  1. Supervision of other normal pregnancy, antepartum Stable  2. Sickle cell trait (HCC) Stable  3. GBS bacteriuria Tx while in labor  4. Nausea and vomiting during pregnancy prior to [redacted] weeks gestation Diet modifications reviewed. Continue with meds as needed   Preterm  labor symptoms and general obstetric precautions including but not limited to vaginal bleeding, contractions, leaking of fluid and fetal movement were reviewed in detail with the patient. Please refer to After Visit Summary for other counseling recommendations.  Return in about 4 weeks (around 07/26/2018) for OB visit.   Hermina StaggersErvin, Tashiya Souders L, MD

## 2018-07-11 ENCOUNTER — Other Ambulatory Visit: Payer: Self-pay

## 2018-07-11 ENCOUNTER — Encounter (HOSPITAL_COMMUNITY): Payer: Self-pay | Admitting: *Deleted

## 2018-07-11 ENCOUNTER — Inpatient Hospital Stay (HOSPITAL_COMMUNITY)
Admission: AD | Admit: 2018-07-11 | Discharge: 2018-07-12 | Disposition: A | Discharge status: Home / Self Care | Payer: Medicaid Other | Source: Ambulatory Visit | Attending: Obstetrics & Gynecology | Admitting: Obstetrics & Gynecology

## 2018-07-11 DIAGNOSIS — O212 Late vomiting of pregnancy: Secondary | ICD-10-CM

## 2018-07-11 DIAGNOSIS — O99332 Smoking (tobacco) complicating pregnancy, second trimester: Secondary | ICD-10-CM | POA: Insufficient documentation

## 2018-07-11 DIAGNOSIS — F17210 Nicotine dependence, cigarettes, uncomplicated: Secondary | ICD-10-CM | POA: Diagnosis not present

## 2018-07-11 DIAGNOSIS — Z3A22 22 weeks gestation of pregnancy: Secondary | ICD-10-CM

## 2018-07-11 DIAGNOSIS — O210 Mild hyperemesis gravidarum: Secondary | ICD-10-CM | POA: Diagnosis present

## 2018-07-11 LAB — URINALYSIS, ROUTINE W REFLEX MICROSCOPIC
Bilirubin Urine: NEGATIVE
Glucose, UA: NEGATIVE mg/dL
Ketones, ur: 80 mg/dL — AB
Leukocytes, UA: NEGATIVE
Nitrite: NEGATIVE
Protein, ur: 30 mg/dL — AB
Specific Gravity, Urine: 1.021 (ref 1.005–1.030)
pH: 5 (ref 5.0–8.0)

## 2018-07-11 LAB — COMPREHENSIVE METABOLIC PANEL
ALT: 10 U/L (ref 0–44)
AST: 18 U/L (ref 15–41)
Albumin: 3.2 g/dL — ABNORMAL LOW (ref 3.5–5.0)
Alkaline Phosphatase: 53 U/L (ref 38–126)
Anion gap: 5 (ref 5–15)
BUN: 6 mg/dL (ref 6–20)
CO2: 24 mmol/L (ref 22–32)
Calcium: 8.7 mg/dL — ABNORMAL LOW (ref 8.9–10.3)
Chloride: 107 mmol/L (ref 98–111)
Creatinine, Ser: 0.7 mg/dL (ref 0.44–1.00)
GFR calc Af Amer: >60 mL/min (ref >60)
GFR calc non Af Amer: >60 mL/min (ref >60)
Glucose, Bld: 102 mg/dL — ABNORMAL HIGH (ref 70–99)
Potassium: 3.1 mmol/L — ABNORMAL LOW (ref 3.5–5.1)
Sodium: 136 mmol/L (ref 135–145)
Total Bilirubin: 0.8 mg/dL (ref 0.3–1.2)
Total Protein: 6.3 g/dL — ABNORMAL LOW (ref 6.5–8.1)

## 2018-07-11 LAB — RAPID URINE DRUG SCREEN, HOSP PERFORMED
Amphetamines: NOT DETECTED
Barbiturates: NOT DETECTED
Benzodiazepines: NOT DETECTED
Cocaine: NOT DETECTED
Opiates: NOT DETECTED
Tetrahydrocannabinol: NOT DETECTED

## 2018-07-11 LAB — CBC
HCT: 31.4 % — ABNORMAL LOW (ref 36.0–46.0)
Hemoglobin: 10.7 g/dL — ABNORMAL LOW (ref 12.0–15.0)
MCH: 30 pg (ref 26.0–34.0)
MCHC: 34.1 g/dL (ref 30.0–36.0)
MCV: 88 fL (ref 78.0–100.0)
Platelets: 179 10*3/uL (ref 150–400)
RBC: 3.57 MIL/uL — ABNORMAL LOW (ref 3.87–5.11)
RDW: 13.6 % (ref 11.5–15.5)
WBC: 8.5 10*3/uL (ref 4.0–10.5)

## 2018-07-11 MED ORDER — M.V.I. ADULT IV INJ
Freq: Once | INTRAVENOUS | Status: AC
  Administered 2018-07-12: 02:00:00 via INTRAVENOUS
  Filled 2018-07-11: qty 10

## 2018-07-11 MED ORDER — DEXTROSE 5 % IN LACTATED RINGERS IV BOLUS
1000.0000 mL | Freq: Once | INTRAVENOUS | Status: AC
  Administered 2018-07-11: 1000 mL via INTRAVENOUS

## 2018-07-11 MED ORDER — PROMETHAZINE HCL 25 MG/ML IJ SOLN
25.0000 mg | Freq: Once | INTRAMUSCULAR | Status: AC
  Administered 2018-07-12: 25 mg via INTRAVENOUS
  Filled 2018-07-11: qty 1

## 2018-07-11 MED ORDER — FAMOTIDINE IN NACL 20-0.9 MG/50ML-% IV SOLN
20.0000 mg | Freq: Once | INTRAVENOUS | Status: AC
  Administered 2018-07-12: 20 mg via INTRAVENOUS
  Filled 2018-07-11: qty 50

## 2018-07-11 NOTE — MAU Note (Signed)
Pt reports n/v since Friday afternoon. Pt last took zofran this morning, but still vomiting throughout the day. Pt "lost count" of how amy times she vomited. Pt also having hot flashes, feel lightheaded and is worn out

## 2018-07-11 NOTE — MAU Provider Note (Signed)
Chief Complaint: Nausea   First Provider Initiated Contact with Patient 07/11/18 2339     SUBJECTIVE HPI: Katelyn Mcfarland is a 27 y.o. [id number] at [redacted]w[redacted]d who presents to Maternity Admissions reporting nausea & vomiting. This has been an ongoing issue with pregnancy. Taking zofran, reglan, pepcid, & phenergan at home. Last took meds this morning. Previously using scop patch but stopped d/t developing rash at application site. States symptoms improved after last MAU visit 2 weeks ago. Was only vomiting 2-3 times per day. Symptoms worsened this pas Friday. Says she's lost counts of how many times she's vomited today. Unable to keep down food or liquids. + heartburn.  No lower abdominal pain, fever/chills, diarrhea, constipation, or vaginal bleeding.    Past Medical History:  Diagnosis Date  . Allergy   . Chlamydia    age 49yo; hx/o trichomonas age 1yo  . Chronic headache   . Depression    doing ok, declined meds  . High cholesterol   . Infection    UTI  . Vaginal Pap smear, abnormal    bx, cryo   OB History  Gravida Para Term Preterm AB Living  6 2 2  0 3 2  SAB TAB Ectopic Multiple Live Births  2 1 0 0 2    # Outcome Date GA Lbr Len/2nd Weight Sex Delivery Anes PTL Lv  6 Current           5 TAB           4 SAB           3 Term     F Vag-Spont  N LIV  2 Term     F Vag-Spont  N LIV  1 SAB            Past Surgical History:  Procedure Laterality Date  . INDUCED ABORTION    . WISDOM TOOTH EXTRACTION     Social History   Socioeconomic History  . Marital status: Single    Spouse name: Not on file  . Number of children: Not on file  . Years of education: Not on file  . Highest education level: Not on file  Occupational History  . Occupation: student [employer]     Employer: [employer]  Social Needs  . Financial resource strain: Not on file  . Food insecurity:    Worry: Not on file    Inability: Not on file  . Transportation needs:    Medical: Not on file    Non-medical: Not  on file  Tobacco Use  . Smoking status: Current Some Day Smoker    Packs/day: 0.50    Years: 16.00    Pack years: 8.00    Types: Cigarettes  . Smokeless tobacco: Never Used  . Tobacco comment: 2every other day  Substance and Sexual Activity  . Alcohol use: Not Currently    Comment: not currently  . Drug use: No    Types: Marijuana    Comment: last Mar 2018  . Sexual activity: Not Currently  Lifestyle  . Physical activity:    Days per week: Not on file    Minutes per session: Not on file  . Stress: Not on file  Relationships  . Social connections:    Talks on phone: Not on file    Gets together: Not on file    Attends religious service: Not on file    Active member of club or organization: Not on file    Attends meetings of  clubs or organizations: Not on file    Relationship status: Not on file  . Intimate partner violence:    Fear of current or ex partner: No    Emotionally abused: No    Physically abused: No    Forced sexual activity: No  Other Topics Concern  . Not on file  Social History Narrative   Single, 2 children, exercise - none.  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 Hypertension Mother   . Diabetes Mother   . Asthma Sister   . Cancer Paternal Grandmother   . Cancer Paternal Grandfather   . Cancer Maternal Grandmother   . Kidney disease Maternal Grandmother   . Diabetes Maternal Grandmother   . Heart disease Neg Hx   . Stroke Neg Hx    No current facility-administered medications on file prior to encounter.    Current Outpatient Medications on File Prior to Encounter  Medication Sig Dispense Refill  . acetaminophen (TYLENOL) 500 MG tablet Take 500 mg by mouth every 6 (six) hours as needed for mild pain.    Marland Kitchen docusate sodium (COLACE) 100 MG capsule Take 1 capsule (100 mg total) by mouth every 12 (twelve) hours. 60 capsule 0  . Elastic Bandages & Supports (COMFORT FIT  MATERNITY SUPP SM) MISC Wear as directed. 1 each 0  . famotidine-calcium carbonate-magnesium hydroxide (PEPCID COMPLETE) 10-800-165 MG chewable tablet Chew 1 tablet by mouth 2 (two) times daily as needed. 60 tablet 11  . metoCLOPramide (REGLAN) 10 MG tablet Take 1 tablet (10 mg total) by mouth 4 (four) times daily -  before meals and at bedtime. 120 tablet 2  . ondansetron (ZOFRAN-ODT) 4 MG disintegrating tablet Take 1 tablet (4 mg total) by mouth every 8 (eight) hours. Take q8h x 14 days and then do q8h prn after that 40 tablet 0  . promethazine (PHENERGAN) 25 MG suppository Place 1 suppository (25 mg total) rectally every 6 (six) hours as needed for nausea or vomiting. 12 each 3  . promethazine (PHENERGAN) 25 MG tablet Take 0.5-1 tablets (12.5-25 mg total) by mouth every 6 (six) hours as needed. 30 tablet 5  . glycopyrrolate (ROBINUL) 1 MG tablet Take 1 tablet (1 mg total) by mouth 3 (three) times daily. 90 tablet 3  . polyethylene glycol (MIRALAX / GLYCOLAX) packet Take 17 g by mouth daily. 14 each 0  . Prenat-Fe Poly-Methfol-FA-DHA (VITAFOL ULTRA) 29-0.6-0.4-200 MG CAPS Take 1 tablet by mouth daily before breakfast. 90 capsule 4  . pyridOXINE (VITAMIN B-6) 25 MG tablet Take 1 tablet (25 mg total) by mouth 2 (two) times daily. 60 tablet 1  . ranitidine (ZANTAC) 150 MG tablet Take 1 tablet (150 mg total) by mouth 2 (two) times daily. 60 tablet 5  . sertraline (ZOLOFT) 50 MG tablet Take 1 tablet (50 mg total) by mouth daily. 30 tablet 3   No Known Allergies  I have reviewed patient's Past Medical Hx, Surgical Hx, Family Hx, Social Hx, medications and allergies.   Review of Systems  Constitutional: Negative.   Gastrointestinal: Positive for nausea and vomiting. Negative for abdominal pain, constipation and diarrhea.  Genitourinary: Negative.   Neurological: Positive for weakness.    OBJECTIVE Patient Vitals for the past 24 hrs:  BP Temp Temp src Pulse Resp SpO2 Weight  07/12/18 0651 108/71  98.4 F (36.9 C) Oral 82 17 - -  07/11/18 2223 129/71 98 F (36.7 C)  Oral 89 20 99 % -  07/11/18 2217 - - - - - - 83.5 kg   Constitutional: Well-developed, well-nourished female in no acute distress.  Cardiovascular: normal rate & rhythm, no murmur Respiratory: normal rate and effort. Lung sounds clear throughout GI: Abd soft, non-tender, Pos BS x 4. No guarding or rebound tenderness MS: Extremities nontender, no edema, normal ROM Neurologic: Alert and oriented x 4.    LAB RESULTS Results for orders placed or performed during the hospital encounter of 07/11/18 (from the past 24 hour(s))  Urinalysis, Routine w reflex microscopic     Status: Abnormal   Collection Time: 07/11/18 10:59 PM  Result Value Ref Range   Color, Urine AMBER (A) YELLOW   APPearance HAZY (A) CLEAR   Specific Gravity, Urine 1.021 1.005 - 1.030   pH 5.0 5.0 - 8.0   Glucose, UA NEGATIVE NEGATIVE mg/dL   Hgb urine dipstick SMALL (A) NEGATIVE   Bilirubin Urine NEGATIVE NEGATIVE   Ketones, ur 80 (A) NEGATIVE mg/dL   Protein, ur 30 (A) NEGATIVE mg/dL   Nitrite NEGATIVE NEGATIVE   Leukocytes, UA NEGATIVE NEGATIVE   RBC / HPF 0-5 0 - 5 RBC/hpf   WBC, UA 0-5 0 - 5 WBC/hpf   Bacteria, UA RARE (A) NONE SEEN   Squamous Epithelial / LPF 11-20 0 - 5   Mucus PRESENT   Rapid urine drug screen (hospital performed)     Status: None   Collection Time: 07/11/18 10:59 PM  Result Value Ref Range   Opiates NONE DETECTED NONE DETECTED   Cocaine NONE DETECTED NONE DETECTED   Benzodiazepines NONE DETECTED NONE DETECTED   Amphetamines NONE DETECTED NONE DETECTED   Tetrahydrocannabinol NONE DETECTED NONE DETECTED   Barbiturates NONE DETECTED NONE DETECTED  CBC     Status: Abnormal   Collection Time: 07/11/18 11:30 PM  Result Value Ref Range   WBC 8.5 4.0 - 10.5 K/uL   RBC 3.57 (L) 3.87 - 5.11 MIL/uL   Hemoglobin 10.7 (L) 12.0 - 15.0 g/dL   HCT 36.6 (L) 44.0 - 34.7 %   MCV 88.0 78.0 - 100.0 fL   MCH 30.0 26.0 - 34.0 pg    MCHC 34.1 30.0 - 36.0 g/dL   RDW 42.5 95.6 - 38.7 %   Platelets 179 150 - 400 K/uL  Comprehensive metabolic panel     Status: Abnormal   Collection Time: 07/11/18 11:30 PM  Result Value Ref Range   Sodium 136 135 - 145 mmol/L   Potassium 3.1 (L) 3.5 - 5.1 mmol/L   Chloride 107 98 - 111 mmol/L   CO2 24 22 - 32 mmol/L   Glucose, Bld 102 (H) 70 - 99 mg/dL   BUN 6 6 - 20 mg/dL   Creatinine, Ser 5.64 0.44 - 1.00 mg/dL   Calcium 8.7 (L) 8.9 - 10.3 mg/dL   Total Protein 6.3 (L) 6.5 - 8.1 g/dL   Albumin 3.2 (L) 3.5 - 5.0 g/dL   AST 18 15 - 41 U/L   ALT 10 0 - 44 U/L   Alkaline Phosphatase 53 38 - 126 U/L   Total Bilirubin 0.8 0.3 - 1.2 mg/dL   GFR calc non Af Amer >60 >60 mL/min   GFR calc Af Amer >60 >60 mL/min   Anion gap 5 5 - 15    IMAGING No results found.  MAU COURSE Orders Placed This Encounter  Procedures  . Urinalysis, Routine w reflex microscopic  . Rapid urine drug screen (hospital performed)  .  CBC  . Comprehensive metabolic panel  . Weigh patient  . Insert peripheral IV  . Discharge patient   Meds ordered this encounter  Medications  . dextrose 5% lactated ringers bolus 1,000 mL  . promethazine (PHENERGAN) injection 25 mg  . famotidine (PEPCID) IVPB 20 mg premix  . multivitamins adult (MVI -12) 10 mL in lactated ringers 1,000 mL infusion  . cyclobenzaprine (FLEXERIL) 10 MG tablet    Sig: Take 1 tablet (10 mg total) by mouth 2 (two) times daily as needed for muscle spasms.    Dispense:  20 tablet    Refill:  0    Order Specific Question:   Supervising Provider    Answer:   Duane Lope H [[id number]]    MDM U/a with 80 ketones & pt vomiting in room IV started. D5LR followed by MVI in LR.  Phenergan 25 mg & pepcid 20 mg Reports improvement in symptoms & was able to keep down PO fluids Pt in MAU until her mother could pick her up after 6 am as she was not stable to drive d/t phenergan. Pt initially told RN & myself that she had been given a ride to the hospital  but at time of discharge stated she had driven.   ASSESSMENT 1. Vomiting of pregnancy, after 22 weeks   2. [redacted] weeks gestation of pregnancy     PLAN Discharge home in stable condition.   Allergies as of 07/12/2018   No Known Allergies     Medication List    STOP taking these medications   methylPREDNISolone 4 MG tablet Commonly known as:  MEDROL   scopolamine 1 MG/3DAYS Commonly known as:  TRANSDERM-SCOP     TAKE these medications   acetaminophen 500 MG tablet Commonly known as:  TYLENOL Take 500 mg by mouth every 6 (six) hours as needed for mild pain.   COMFORT FIT MATERNITY SUPP SM Misc Wear as directed.   cyclobenzaprine 10 MG tablet Commonly known as:  FLEXERIL Take 1 tablet (10 mg total) by mouth 2 (two) times daily as needed for muscle spasms.   docusate sodium 100 MG capsule Commonly known as:  COLACE Take 1 capsule (100 mg total) by mouth every 12 (twelve) hours.   famotidine-calcium carbonate-magnesium hydroxide 10-800-165 MG chewable tablet Commonly known as:  PEPCID COMPLETE Chew 1 tablet by mouth 2 (two) times daily as needed.   glycopyrrolate 1 MG tablet Commonly known as:  ROBINUL Take 1 tablet (1 mg total) by mouth 3 (three) times daily.   metoCLOPramide 10 MG tablet Commonly known as:  REGLAN Take 1 tablet (10 mg total) by mouth 4 (four) times daily -  before meals and at bedtime.   ondansetron 4 MG disintegrating tablet Commonly known as:  ZOFRAN-ODT Take 1 tablet (4 mg total) by mouth every 8 (eight) hours. Take q8h x 14 days and then do q8h prn after that   polyethylene glycol packet Commonly known as:  MIRALAX / GLYCOLAX Take 17 g by mouth daily.   promethazine 25 MG tablet Commonly known as:  PHENERGAN Take 0.5-1 tablets (12.5-25 mg total) by mouth every 6 (six) hours as needed.   promethazine 25 MG suppository Commonly known as:  PHENERGAN Place 1 suppository (25 mg total) rectally every 6 (six) hours as needed for nausea or  vomiting.   pyridOXINE 25 MG tablet Commonly known as:  VITAMIN B-6 Take 1 tablet (25 mg total) by mouth 2 (two) times daily.   ranitidine 150 MG tablet  Commonly known as:  ZANTAC Take 1 tablet (150 mg total) by mouth 2 (two) times daily.   sertraline 50 MG tablet Commonly known as:  ZOLOFT Take 1 tablet (50 mg total) by mouth daily.   VITAFOL ULTRA 29-0.6-0.4-200 MG Caps Take 1 tablet by mouth daily before breakfast.        Judeth Horn, NP 07/12/2018  9:50 AM

## 2018-07-12 MED ORDER — CYCLOBENZAPRINE HCL 10 MG PO TABS: 10.0000 mg | ORAL_TABLET | Freq: Two times a day (BID) | ORAL | 0 refills | Status: DC | PRN

## 2018-07-12 NOTE — Discharge Instructions (Signed)
Eating Plan for Hyperemesis Gravidarum °Hyperemesis gravidarum is a severe form of morning sickness. Because this condition causes severe nausea and vomiting, it can lead to dehydration, malnutrition, and weight loss. One way to lessen the symptoms of nausea and vomiting is to follow the eating plan for hyperemesis gravidarum. It is often used along with prescribed medicines to control your symptoms. °What can I do to relieve my symptoms? °Listen to your body. Everyone is different and has different preferences. Find what works best for you. Take any of the following actions that are helpful to you: °· Eat and drink slowly. °· Eat 5-6 small meals daily instead of 3 large meals. °· Eat crackers before you get out of bed in the morning. °· Try having a snack in the middle of the night. °· Starchy foods are usually tolerated well. Examples include cereal, toast, bread, potatoes, pasta, rice, and pretzels. °· Ginger may help with nausea. Add ¼ tsp ground ginger to hot tea or choose ginger tea. °· Try drinking 100% fruit juice or an electrolyte drink. An electrolyte drink contains sodium, potassium, and chloride. °· Continue to take your prenatal vitamins as told by your health care provider. If you are having trouble taking your prenatal vitamins, talk with your health care provider about different options. °· Include at least 1 serving of protein with your meals and snacks. Protein options include meats or poultry, beans, nuts, eggs, and yogurt. Try eating a protein-rich snack before bed. Examples of these snacks include cheese and crackers or half of a peanut butter or turkey sandwich. °· Consider eliminating foods that trigger your symptoms. These may include spicy foods, coffee, high-fat foods, very sweet foods, and acidic foods. °· Try meals that have more protein combined with bland, salty, lower-fat, and dry foods, such as nuts, seeds, pretzels, crackers, and cereal. °· Talk with your healthcare provider about  starting a supplement of vitamin B6. °· Have fluids that are cold, clear, and carbonated or sour. Examples include lemonade, ginger ale, lemon-lime soda, ice water, and sparkling water. °· Try lemon or mint tea. °· Try brushing your teeth or using a mouth rinse after meals. ° °What should I avoid to reduce my symptoms? °Avoiding some of the following things may help reduce your symptoms. °· Foods with strong smells. Try eating meals in well-ventilated areas that are free of odors. °· Drinking water or other beverages with meals. Try not to drink anything during the 30 minutes before and after your meals. °· Drinking more than 1 cup of fluid at a time. Sometimes using a straw helps. °· Fried or high-fat foods, such as butter and cream sauces. °· Spicy foods. °· Skipping meals as best as you can. Nausea can be more intense on an empty stomach. If you cannot tolerate food at that time, do not force it. Try sucking on ice chips or other frozen items, and make up for missed calories later. °· Lying down within 2 hours after eating. °· Environmental triggers. These may include smoky rooms, closed spaces, rooms with strong smells, warm or humid places, overly loud and noisy rooms, and rooms with motion or flickering lights. °· Quick and sudden changes in your movement. ° °This information is not intended to replace advice given to you by your health care provider. Make sure you discuss any questions you have with your health care provider. °Document Released: 07/27/2007 Document Revised: 05/28/2016 Document Reviewed: 04/29/2016 °Elsevier Interactive Patient Education © 2018 [employer] ° °

## 2018-07-12 NOTE — MAU Note (Signed)
Pt was PO challenged and was able to keep crackers and ginger ale down without vomiting. Pt resting in room.

## 2018-07-12 NOTE — MAU Note (Signed)
Pt states her mother is on the way to drive her home. Pt is ambulating to the bathroom.

## 2018-07-20 ENCOUNTER — Telehealth: Payer: Self-pay

## 2018-07-20 ENCOUNTER — Other Ambulatory Visit: Payer: Self-pay

## 2018-07-20 DIAGNOSIS — O99619 Diseases of the digestive system complicating pregnancy, unspecified trimester: Principal | ICD-10-CM

## 2018-07-20 DIAGNOSIS — K5900 Constipation, unspecified: Secondary | ICD-10-CM

## 2018-07-20 MED ORDER — DOCUSATE SODIUM 100 MG PO CAPS: 100.0000 mg | ORAL_CAPSULE | Freq: Two times a day (BID) | ORAL | 0 refills | Status: DC

## 2018-07-20 NOTE — Progress Notes (Signed)
Pt states she never picked up her colace medication and pharmacy is stating they don't have it anymore- resent rx.

## 2018-07-20 NOTE — Telephone Encounter (Signed)
Pt called in stating that she is still experiencing N&V and that she feels the phenergan suppositories, which is the only medication working to improve her symptoms, is causing her chest to feel "tight" in the mornings. After discussing with Dr. Debroah Loop, he feels it would be better to asess the patients symptoms in the office at her next visit, as long as she is able to keep some food down and remain hydrated at this time which she state she is. I advised patient to try the zofran again and pt states that it makes her constipated. Pt states she never picked up the colace that was prescribed to her, I resent that rx for her. Offered pt appt to come in this week instead of Monday for her scheduled OB visit- pt declined and said she can wait until Monday for her appt.

## 2018-07-26 ENCOUNTER — Ambulatory Visit (INDEPENDENT_AMBULATORY_CARE_PROVIDER_SITE_OTHER): Payer: Medicaid Other | Admitting: Obstetrics & Gynecology

## 2018-07-26 DIAGNOSIS — Z3480 Encounter for supervision of other normal pregnancy, unspecified trimester: Secondary | ICD-10-CM

## 2018-07-26 NOTE — Progress Notes (Signed)
   PRENATAL VISIT NOTE  Subjective:  Katelyn Mcfarland is a 27 y.o. [id number] at [redacted]w[redacted]d being seen today for ongoing prenatal care.  She is currently monitored for the following issues for this low-risk pregnancy and has Current smoker; Supervision of normal pregnancy, antepartum; GBS bacteriuria; Sickle cell trait (HCC); BMI 35-39; and Obesity in pregnancy on their problem list.  Patient reports no complaints.  Contractions: Not present. Vag. Bleeding: None.  Movement: Present. Denies leaking of fluid.   The following portions of the patient's history were reviewed and updated as appropriate: allergies, current medications, past family history, past medical history, past social history, past surgical history and problem list. Problem list updated.  Objective:   Vitals:   07/26/18 0902  BP: 101/69  Pulse: 96  Weight: 189 lb 1.6 oz (85.8 kg)    Fetal Status: Fetal Heart Rate (bpm): 150 Fundal Height: 24 cm Movement: Present     General:  Alert, oriented and cooperative. Patient is in no acute distress.  Skin: Skin is warm and dry. No rash noted.   Cardiovascular: Normal heart rate noted  Respiratory: Normal respiratory effort, no problems with respiration noted  Abdomen: Soft, gravid, appropriate for gestational age.  Pain/Pressure: Present     Pelvic: Cervical exam deferred        Extremities: Normal range of motion.  Edema: Trace  Mental Status: Normal mood and affect. Normal behavior. Normal judgment and thought content.     Assessment and Plan:  Pregnancy: [id number] at [redacted]w[redacted]d  1. Supervision of other normal pregnancy, antepartum Discussed contraception in detail, leaning towards BTL.  Details about this permanent and irreversible modality discussed in detail, risks of procedure discussed with patient including but not limited to: risk of regret, permanence of method, bleeding, infection, injury to surrounding organs and need for additional procedures.  Failure risk of about 1-2% with  increased risk of ectopic gestation if pregnancy occurs was also discussed with patient.  Patient verbalized understanding of these risks and will think about it, information given to her to review at home.  Preterm labor symptoms and general obstetric precautions including but not limited to vaginal bleeding, contractions, leaking of fluid and fetal movement were reviewed in detail with the patient. Please refer to After Visit Summary for other counseling recommendations.  Return in about 4 weeks (around 08/23/2018) for 2 hr GTT, 3rd trimester labs, TDap & Flu vaccines, OB Visit.  Future Appointments  Date Time Provider Department Center  08/23/2018  9:00 AM [facility] None  08/23/2018  9:15 AM Constant, Gigi Gin, MD [facility] None    Jaynie Collins, MD

## 2018-07-26 NOTE — Patient Instructions (Addendum)
Second Trimester of Pregnancy The second trimester is from week 13 through week 28, month 4 through 6. This is often the time in pregnancy that you feel your best. Often times, morning sickness has lessened or quit. You may have more energy, and you may get hungry more often. Your unborn baby (fetus) is growing rapidly. At the end of the sixth month, he or she is about 9 inches long and weighs about 1 pounds. You will likely feel the baby move (quickening) between 18 and 20 weeks of pregnancy.  Research childbirth classes and hospital preregistration at [email or web address]  Follow these instructions at home:  Avoid all smoking, herbs, and alcohol. Avoid drugs not approved by your doctor.  Do not use any tobacco products, including cigarettes, chewing tobacco, and electronic cigarettes. If you need help quitting, ask your doctor. You may get counseling or other support to help you quit.  Only take medicine as told by your doctor. Some medicines are safe and some are not during pregnancy.  Exercise only as told by your doctor. Stop exercising if you start having cramps.  Eat regular, healthy meals.  Wear a good support bra if your breasts are tender.  Do not use hot tubs, steam rooms, or saunas.  Wear your seat belt when driving.  Avoid raw meat, uncooked cheese, and liter boxes and soil used by cats.  Take your prenatal vitamins.  Take 1500-2000 milligrams of calcium daily starting at the 20th week of pregnancy until you deliver your baby.  Try taking medicine that helps you poop (stool softener) as needed, and if your doctor approves. Eat more fiber by eating fresh fruit, vegetables, and whole grains. Drink enough fluids to keep your pee (urine) clear or pale yellow.  Take warm water baths (sitz baths) to soothe pain or discomfort caused by hemorrhoids. Use hemorrhoid cream if your doctor approves.  If you have puffy, bulging veins (varicose veins), wear support hose. Raise  (elevate) your feet for 15 minutes, 3-4 times a day. Limit salt in your diet.  Avoid heavy lifting, wear low heals, and sit up straight.  Rest with your legs raised if you have leg cramps or low back pain.  Visit your dentist if you have not gone during your pregnancy. Use a soft toothbrush to brush your teeth. Be gentle when you floss.  You can have sex (intercourse) unless your doctor tells you not to.  Go to your doctor visits.  Get help if:  You feel dizzy.  You have mild cramps or pressure in your lower belly (abdomen).  You have a nagging pain in your belly area.  You continue to feel sick to your stomach (nauseous), throw up (vomit), or have watery poop (diarrhea).  You have bad smelling fluid coming from your vagina.  You have pain with peeing (urination). Get help right away if:  You have a fever.  You are leaking fluid from your vagina.  You have spotting or bleeding from your vagina.  You have severe belly cramping or pain.  You lose or gain weight rapidly.  You have trouble catching your breath and have chest pain.  You notice sudden or extreme puffiness (swelling) of your face, hands, ankles, feet, or legs.  You have not felt the baby move in over an hour.  You have severe headaches that do not go away with medicine.  You have vision changes. This information is not intended to replace advice given to you by your health care provider. Make   sure you discuss any questions you have with your health care provider. Document Released: 12/24/2009 Document Revised: 03/06/2016 Document Reviewed: 11/30/2012 Elsevier Interactive Patient Education  2017 [employer]   TDaP Vaccine Pregnancy Get the Whooping Cough Vaccine While You Are Pregnant (CDC)  It is important for women to get the whooping cough vaccine in the third trimester of each pregnancy. Vaccines are the best way to prevent this disease. There are 2 different whooping cough vaccines. Both vaccines  combine protection against whooping cough, tetanus and diphtheria, but they are for different age groups: Tdap: for everyone 11 years or older, including pregnant women  DTaP: for children 2 months through 68 years of age  You need the whooping cough vaccine during each of your pregnancies The recommended time to get the shot is during your 27th through 36th week of pregnancy, preferably during the earlier part of this time period. The Centers for Disease Control and Prevention (CDC) recommends that pregnant women receive the whooping cough vaccine for adolescents and adults (called Tdap vaccine) during the third trimester of each pregnancy. The recommended time to get the shot is during your 27th through 36th week of pregnancy, preferably during the earlier part of this time period. This replaces the original recommendation that pregnant women get the vaccine only if they had not previously received it. The [employer] of [employer]ians and [employer]necologists and the [employer] support this recommendation.  You should get the whooping cough vaccine while pregnant to pass protection to your baby frame support disabled and/or not supported in this browser  Learn why Vernona Rieger decided to get the whooping cough vaccine in her 3rd trimester of pregnancy and how her baby girl was born with some protection against the disease. Also available on YouTube. After receiving the whooping cough vaccine, your body will create protective antibodies (proteins produced by the body to fight off diseases) and pass some of them to your baby before birth. These antibodies provide your baby some short-term protection against whooping cough in early life. These antibodies can also protect your baby from some of the more serious complications that come along with whooping cough. Your protective antibodies are at their highest about 2 weeks after getting the vaccine, but it takes time to pass them to your  baby. So the preferred time to get the whooping cough vaccine is early in your third trimester. The amount of whooping cough antibodies in your body decreases over time. That is why CDC recommends you get a whooping cough vaccine during each pregnancy. Doing so allows each of your babies to get the greatest number of protective antibodies from you. This means each of your babies will get the best protection possible against this disease.  Getting the whooping cough vaccine while pregnant is better than getting the vaccine after you give birth Whooping cough vaccination during pregnancy is ideal so your baby will have short-term protection as soon as he is born. This early protection is important because your baby will not start getting his whooping cough vaccines until he is 2 months old. These first few months of life are when your baby is at greatest risk for catching whooping cough. This is also when he's at greatest risk for having severe, potentially life-threating complications from the infection. To avoid that gap in protection, it is best to get a whooping cough vaccine during pregnancy. You will then pass protection to your baby before he is born. To continue protecting your baby, he should  get whooping cough vaccines starting at 2 months old. You may never have gotten the Tdap vaccine before and did not get it during this pregnancy. If so, you should make sure to get the vaccine immediately after you give birth, before leaving the hospital or birthing center. It will take about 2 weeks before your body develops protection (antibodies) in response to the vaccine. Once you have protection from the vaccine, you are less likely to give whooping cough to your newborn while caring for him. But remember, your baby will still be at risk for catching whooping cough from others. A recent study looked to see how effective Tdap was at preventing whooping cough in babies whose mothers got the vaccine while  pregnant or in the hospital after giving birth. The study found that getting Tdap between 27 through 36 weeks of pregnancy is 85% more effective at preventing whooping cough in babies younger than 2 months old. Blood tests cannot tell if you need a whooping cough vaccine There are no blood tests that can tell you if you have enough antibodies in your body to protect yourself or your baby against whooping cough. Even if you have been sick with whooping cough in the past or previously received the vaccine, you still should get the vaccine during each pregnancy. Breastfeeding may pass some protective antibodies onto your baby By breastfeeding, you may pass some antibodies you have made in response to the vaccine to your baby. When you get a whooping cough vaccine during your pregnancy, you will have antibodies in your breast milk that you can share with your baby as soon as your milk comes in. However, your baby will not get protective antibodies immediately if you wait to get the whooping cough vaccine until after delivering your baby. This is because it takes about 2 weeks for your body to create antibodies. Learn more about the health benefits of breastfeeding.    Postpartum Tubal Ligation Postpartum tubal ligation (PPTL) is a procedure to close the fallopian tubes. This is done so that you cannot get pregnant. When the fallopian tubes are closed, the eggs that the ovaries release cannot enter the uterus, and sperm cannot reach the eggs. PPTL is done right after childbirth or 1-2 days after childbirth, before the uterus returns to its normal location. PPTL is sometimes called "getting your tubes tied." You should not have this procedure if you want to get pregnant someday or if you are unsure about having more children. Tell a health care provider about:  Any allergies you have.  All medicines you are taking, including vitamins, herbs, eye drops, creams, and over-the-counter medicines.  Previous  problems you or members of your family have had with the use of anesthetics.  Any blood disorders you have.  Previous surgeries you have had.  Any medical conditions you may have.  Any past pregnancies. What are the risks? Generally, this is a safe procedure. However, problems may occur, including:  Infection.  Bleeding.  Injury to surrounding organs.  Side effects from anesthetics.  Failure of the procedure.  This procedure can increase your risk of a kind of pregnancy in which a fertilized egg attaches to the outside of the uterus (ectopic pregnancy). What happens before the procedure?  Ask your health care provider about: ? How much pain you can expect to have. ? What medicines you will be given for pain, especially if you are planning to breastfeed.  Follow instructions from your health care provider about eating and  drinking restrictions. What happens during the procedure? If you had a vaginal delivery:  You may be given one or more of the following: ? A medicine that helps you relax (sedative). ? A medicine to numb the area (local anesthetic). ? A medicine to make you fall asleep (general anesthetic). ? A medicine that is injected into an area of your body to numb everything below the injection site (regional anesthetic).  If you have been given a general anesthetic, a tube will be put down your throat to help you breathe.  An IV tube will be inserted into one of your veins to give you medicines and fluids during the procedure.  Your bladder may be emptied with a small tube (catheter).  An incision will be made just below your belly button.  Your fallopian tubes will be located and brought up through the incision.  Your fallopian tubes will be tied off, burned (cauterized), or blocked with a clip, ring, or clamp. A small portion in the center of each fallopian tube may be removed.  The incision will be closed with stitches (sutures).  A bandage (dressing)  will be placed over the incision.  If you had a cesarean delivery:  Tubal ligation will be done through the incision that was used for the cesarean delivery of your baby.  The incision will be closed with sutures.  A dressing will be placed over the incision.  The procedure may vary among health care providers and hospitals. What happens after the procedure?  Your blood pressure, heart rate, breathing rate, and blood oxygen level will be monitored often until the medicines you were given have worn off.  You will be given pain medicine as needed.  Do not drive for 24 hours if you received a sedative. This information is not intended to replace advice given to you by your health care provider. Make sure you discuss any questions you have with your health care provider. Document Released: 09/29/2005 Document Revised: 03/03/2016 Document Reviewed: 09/09/2015 Elsevier Interactive Patient Education  [employer].

## 2018-07-31 ENCOUNTER — Inpatient Hospital Stay (HOSPITAL_COMMUNITY)
Admission: AD | Admit: 2018-07-31 | Discharge: 2018-07-31 | Disposition: A | Discharge status: Home / Self Care | Payer: Medicaid Other | Source: Ambulatory Visit | Attending: Obstetrics and Gynecology | Admitting: Obstetrics and Gynecology

## 2018-07-31 ENCOUNTER — Encounter (HOSPITAL_COMMUNITY): Payer: Self-pay | Admitting: *Deleted

## 2018-07-31 DIAGNOSIS — F17210 Nicotine dependence, cigarettes, uncomplicated: Secondary | ICD-10-CM | POA: Insufficient documentation

## 2018-07-31 DIAGNOSIS — O2342 Unspecified infection of urinary tract in pregnancy, second trimester: Secondary | ICD-10-CM

## 2018-07-31 DIAGNOSIS — R109 Unspecified abdominal pain: Secondary | ICD-10-CM | POA: Diagnosis present

## 2018-07-31 DIAGNOSIS — O99332 Smoking (tobacco) complicating pregnancy, second trimester: Secondary | ICD-10-CM | POA: Insufficient documentation

## 2018-07-31 DIAGNOSIS — Z3A24 24 weeks gestation of pregnancy: Secondary | ICD-10-CM | POA: Insufficient documentation

## 2018-07-31 LAB — URINALYSIS, ROUTINE W REFLEX MICROSCOPIC
Bilirubin Urine: NEGATIVE
Glucose, UA: NEGATIVE mg/dL
Ketones, ur: NEGATIVE mg/dL
Leukocytes, UA: NEGATIVE
Nitrite: NEGATIVE
Protein, ur: NEGATIVE mg/dL
Specific Gravity, Urine: 1.012 (ref 1.005–1.030)
pH: 6 (ref 5.0–8.0)

## 2018-07-31 MED ORDER — NITROFURANTOIN MONOHYD MACRO 100 MG PO CAPS: 100.0000 mg | ORAL_CAPSULE | Freq: Two times a day (BID) | ORAL | 0 refills | Status: DC

## 2018-07-31 NOTE — MAU Provider Note (Signed)
Chief Complaint:  Abdominal Pain and Back Pain   First Provider Initiated Contact with Patient 07/31/18 0936     HPI: Katelyn Mcfarland is a 27 y.o. [id number] at [redacted]w[redacted]d who presents to maternity admissions reporting urinary frequency, abdominal cramping, & low back pain. Symptoms began 5 days ago. States her urine smells strong & foul. Reports urinary frequency & urgency. Associated symptoms include lower abdominal cramping & low back pain. Denies hematuria, dysuria, fever/chills, or flank pain.  Denies contractions, leakage of fluid or vaginal bleeding. Good fetal movement.  Location: lower abdomen/low back Quality: cramping Severity: 10/10 in pain scale Duration: 5 days Timing: intermittent Modifying factors: nothing makes better or worse, hasn't treated symptoms Associated signs and symptoms: urinary urgency & frequency  Pregnancy Course: HEG  Past Medical History:  Diagnosis Date  . Allergy   . Chlamydia    age 17yo; hx/o trichomonas age 65yo  . Chronic headache   . Depression    doing ok, declined meds  . High cholesterol   . Infection    UTI  . Vaginal Pap smear, abnormal    bx, cryo   OB History  Gravida Para Term Preterm AB Living  6 2 2  0 3 2  SAB TAB Ectopic Multiple Live Births  2 1 0 0 2    # Outcome Date GA Lbr Len/2nd Weight Sex Delivery Anes PTL Lv  6 Current           5 TAB           4 SAB           3 Term     F Vag-Spont  N LIV  2 Term     F Vag-Spont  N LIV  1 SAB            Past Surgical History:  Procedure Laterality Date  . INDUCED ABORTION    . WISDOM TOOTH EXTRACTION     Family History  Problem Relation Age of Onset  . Hypertension Mother   . Diabetes Mother   . Asthma Sister   . Cancer Paternal Grandmother   . Cancer Paternal Grandfather   . Cancer Maternal Grandmother   . Kidney disease Maternal Grandmother   . Diabetes Maternal Grandmother   . Heart disease Neg Hx   . Stroke Neg Hx    Social History   Tobacco Use  . Smoking  status: Current Some Day Smoker    Packs/day: 0.50    Years: 16.00    Pack years: 8.00    Types: Cigarettes  . Smokeless tobacco: Never Used  . Tobacco comment: 2every other day  Substance Use Topics  . Alcohol use: Not Currently    Comment: not currently  . Drug use: No    Types: Marijuana    Comment: last Mar 2018   No Known Allergies No medications prior to admission.    I have reviewed patient's Past Medical Hx, Surgical Hx, Family Hx, Social Hx, medications and allergies.   ROS:  Review of Systems  Constitutional: Negative.   Gastrointestinal: Positive for abdominal pain and nausea. Negative for constipation, diarrhea and vomiting.  Genitourinary: Positive for frequency and urgency. Negative for dysuria, flank pain, hematuria, vaginal bleeding and vaginal discharge.  Musculoskeletal: Positive for back pain.    Physical Exam   Patient Vitals for the past 24 hrs:  BP Temp Temp src Pulse Resp SpO2 Weight  07/31/18 0957 114/63 - - 95 17 99 % -  07/31/18  0849 122/73 98.2 F (36.8 C) Oral (!) 102 17 99 % -  07/31/18 0838 - - - - - - 88.9 kg    Constitutional: Well-developed, well-nourished female in no acute distress.  Cardiovascular: normal rate & rhythm, no murmur Respiratory: normal effort, lung sounds clear throughout GI: Abd soft, non-tender, gravid appropriate for gestational age. Pos BS x 4. No CVAT MS: Extremities nontender, no edema, normal ROM Neurologic: Alert and oriented x 4.  GU:      Dilation: Closed Effacement (%): Thick Cervical Position: Posterior Exam by:: Nuh Lipton np  NST:  Baseline: 150 bpm, Variability: Good {> 6 bpm), Accelerations: Non-reactive but appropriate for gestational age and Decelerations: Variable: mild x 1   Labs: Results for orders placed or performed during the hospital encounter of 07/31/18 (from the past 24 hour(s))  Urinalysis, Routine w reflex microscopic     Status: Abnormal   Collection Time: 07/31/18  9:02 AM  Result  Value Ref Range   Color, Urine YELLOW YELLOW   APPearance HAZY (A) CLEAR   Specific Gravity, Urine 1.012 1.005 - 1.030   pH 6.0 5.0 - 8.0   Glucose, UA NEGATIVE NEGATIVE mg/dL   Hgb urine dipstick SMALL (A) NEGATIVE   Bilirubin Urine NEGATIVE NEGATIVE   Ketones, ur NEGATIVE NEGATIVE mg/dL   Protein, ur NEGATIVE NEGATIVE mg/dL   Nitrite NEGATIVE NEGATIVE   Leukocytes, UA NEGATIVE NEGATIVE   RBC / HPF 6-10 0 - 5 RBC/hpf   WBC, UA 6-10 0 - 5 WBC/hpf   Bacteria, UA MANY (A) NONE SEEN   Squamous Epithelial / LPF 0-5 0 - 5    Imaging:  No results found.  MAU Course: Orders Placed This Encounter  Procedures  . Culture, OB Urine  . Urinalysis, Routine w reflex microscopic  . Discharge patient   Meds ordered this encounter  Medications  . nitrofurantoin, macrocrystal-monohydrate, (MACROBID) 100 MG capsule    Sig: Take 1 capsule (100 mg total) by mouth 2 (two) times daily.    Dispense:  14 capsule    Refill:  0    Order Specific Question:   Supervising Provider    Answer:   CONSTANT, PEGGY [[id number]]    MDM: No ctx on monitor. Cervix closed/thick VSS, NAD. No CVAT.   Assessment: 1. Urinary tract infection in mother during second trimester of pregnancy   2. [redacted] weeks gestation of pregnancy     Plan: Discharge home in stable condition.  Preterm Labor precautions and fetal kick counts Return for worsening symptoms, fever/chills, flank pain   Allergies as of 07/31/2018   No Known Allergies     Medication List    STOP taking these medications   cyclobenzaprine 10 MG tablet Commonly known as:  FLEXERIL   docusate sodium 100 MG capsule Commonly known as:  COLACE   polyethylene glycol packet Commonly known as:  MIRALAX / GLYCOLAX   pyridOXINE 25 MG tablet Commonly known as:  VITAMIN B-6   sertraline 50 MG tablet Commonly known as:  ZOLOFT   VITAFOL ULTRA 29-0.6-0.4-200 MG Caps     TAKE these medications   acetaminophen 500 MG tablet Commonly known as:   TYLENOL Take 500 mg by mouth every 6 (six) hours as needed for mild pain.   COMFORT FIT MATERNITY SUPP SM Misc Wear as directed.   famotidine-calcium carbonate-magnesium hydroxide 10-800-165 MG chewable tablet Commonly known as:  PEPCID COMPLETE Chew 1 tablet by mouth 2 (two) times daily as needed.   glycopyrrolate 1 MG  tablet Commonly known as:  ROBINUL Take 1 tablet (1 mg total) by mouth 3 (three) times daily.   metoCLOPramide 10 MG tablet Commonly known as:  REGLAN Take 1 tablet (10 mg total) by mouth 4 (four) times daily -  before meals and at bedtime.   nitrofurantoin (macrocrystal-monohydrate) 100 MG capsule Commonly known as:  MACROBID Take 1 capsule (100 mg total) by mouth 2 (two) times daily.   ondansetron 4 MG disintegrating tablet Commonly known as:  ZOFRAN-ODT Take 1 tablet (4 mg total) by mouth every 8 (eight) hours. Take q8h x 14 days and then do q8h prn after that   promethazine 25 MG tablet Commonly known as:  PHENERGAN Take 0.5-1 tablets (12.5-25 mg total) by mouth every 6 (six) hours as needed.   promethazine 25 MG suppository Commonly known as:  PHENERGAN Place 1 suppository (25 mg total) rectally every 6 (six) hours as needed for nausea or vomiting.   ranitidine 150 MG tablet Commonly known as:  ZANTAC Take 1 tablet (150 mg total) by mouth 2 (two) times daily.       Judeth Horn, NP 07/31/2018 10:10 AM

## 2018-07-31 NOTE — Discharge Instructions (Signed)

## 2018-07-31 NOTE — MAU Note (Signed)
Katelyn Mcfarland is a 27 y.o. at [redacted]w[redacted]d here in MAU reporting: ? "really bad uti". +pain and pressure in lower back and abdomen. +strong odor to urine Onset of complaint: 5 days Pain score: 10/10. Intermittent. Feels like she is going into labor she reports. Vitals:   07/31/18 0849 07/31/18 0957  BP: 122/73 114/63  Pulse: (!) 102 95  Resp: 17 17  Temp: 98.2 F (36.8 C)   SpO2: 99% 99%    FHT:155 Lab orders placed from triage: ua

## 2018-08-04 LAB — CULTURE, OB URINE: Culture: 10000 — AB

## 2018-08-05 ENCOUNTER — Telehealth: Payer: Self-pay | Admitting: Certified Nurse Midwife

## 2018-08-05 DIAGNOSIS — O2342 Unspecified infection of urinary tract in pregnancy, second trimester: Secondary | ICD-10-CM

## 2018-08-05 MED ORDER — CEPHALEXIN 500 MG PO CAPS: 500.0000 mg | ORAL_CAPSULE | Freq: Four times a day (QID) | ORAL | 0 refills | Status: DC

## 2018-08-05 NOTE — Telephone Encounter (Signed)
+  UTI with GBS, pt originally started on Macrobid. Recommend switch to Keflex. Rx sent. Pt verbalizes understanding.

## 2018-08-23 ENCOUNTER — Other Ambulatory Visit: Payer: Medicaid Other

## 2018-08-23 ENCOUNTER — Encounter: Payer: Self-pay | Admitting: Obstetrics and Gynecology

## 2018-08-23 ENCOUNTER — Ambulatory Visit (INDEPENDENT_AMBULATORY_CARE_PROVIDER_SITE_OTHER): Payer: Medicaid Other | Admitting: Obstetrics and Gynecology

## 2018-08-23 VITALS — BP 135/76 | HR 102 | Wt 197.3 lb

## 2018-08-23 DIAGNOSIS — O99213 Obesity complicating pregnancy, third trimester: Secondary | ICD-10-CM

## 2018-08-23 DIAGNOSIS — Z23 Encounter for immunization: Secondary | ICD-10-CM | POA: Diagnosis not present

## 2018-08-23 DIAGNOSIS — O99210 Obesity complicating pregnancy, unspecified trimester: Secondary | ICD-10-CM

## 2018-08-23 DIAGNOSIS — Z3483 Encounter for supervision of other normal pregnancy, third trimester: Secondary | ICD-10-CM

## 2018-08-23 DIAGNOSIS — R8271 Bacteriuria: Secondary | ICD-10-CM

## 2018-08-23 DIAGNOSIS — Z3480 Encounter for supervision of other normal pregnancy, unspecified trimester: Secondary | ICD-10-CM

## 2018-08-23 NOTE — Progress Notes (Signed)
   PRENATAL VISIT NOTE  Subjective:  Katelyn Mcfarland is a 27 y.o. [id number] at [redacted]w[redacted]d being seen today for ongoing prenatal care.  She is currently monitored for the following issues for this low-risk pregnancy and has Current smoker; Supervision of normal pregnancy, antepartum; GBS bacteriuria; Sickle cell trait (HCC); BMI 35-39; and Obesity in pregnancy on their problem list.  Patient reports no complaints.  Contractions: Irritability. Vag. Bleeding: None.  Movement: Present. Denies leaking of fluid.   The following portions of the patient's history were reviewed and updated as appropriate: allergies, current medications, past family history, past medical history, past social history, past surgical history and problem list. Problem list updated.  Objective:   Vitals:   08/23/18 0914  BP: 135/76  Pulse: (!) 102  Weight: 197 lb 4.8 oz (89.5 kg)    Fetal Status: Fetal Heart Rate (bpm): 150 Fundal Height: 30 cm Movement: Present     General:  Alert, oriented and cooperative. Patient is in no acute distress.  Skin: Skin is warm and dry. No rash noted.   Cardiovascular: Normal heart rate noted  Respiratory: Normal respiratory effort, no problems with respiration noted  Abdomen: Soft, gravid, appropriate for gestational age.  Pain/Pressure: Present     Pelvic: Cervical exam deferred        Extremities: Normal range of motion.  Edema: Trace  Mental Status: Normal mood and affect. Normal behavior. Normal judgment and thought content.   Assessment and Plan:  Pregnancy: [id number] at [redacted]w[redacted]d  1. Supervision of other normal pregnancy, antepartum Patient is doing well without complaints Third trimester labs, tdap and flu vaccine today Patient signed BTL papers - Glucose Tolerance, 2 Hours w/1 Hour - CBC - RPR - HIV Antibody (routine testing w rflx) - Culture, OB Urine  2. GBS bacteriuria Will receive prophylaxis in labor  3. Obesity in pregnancy   Preterm labor symptoms and general  obstetric precautions including but not limited to vaginal bleeding, contractions, leaking of fluid and fetal movement were reviewed in detail with the patient. Please refer to After Visit Summary for other counseling recommendations.  Return in about 2 weeks (around 09/06/2018) for ROB.  No future appointments.  Catalina Antigua, MD

## 2018-08-23 NOTE — Progress Notes (Signed)
Pt is here for ROB and 2 hr gtt. G6P2 [redacted]w[redacted]d. BTL consent signed today.

## 2018-08-24 LAB — CBC
Hematocrit: 36 % (ref 34.0–46.6)
Hemoglobin: 11.6 g/dL (ref 11.1–15.9)
MCH: 28.7 pg (ref 26.6–33.0)
MCHC: 32.2 g/dL (ref 31.5–35.7)
MCV: 89 fL (ref 79–97)
Platelets: 195 10*3/uL (ref 150–450)
RBC: 4.04 x10E6/uL (ref 3.77–5.28)
RDW: 13.6 % (ref 12.3–15.4)
WBC: 11.2 10*3/uL — ABNORMAL HIGH (ref 3.4–10.8)

## 2018-08-24 LAB — GLUCOSE TOLERANCE, 2 HOURS W/ 1HR
Glucose, 1 hour: 145 mg/dL (ref 65–179)
Glucose, 2 hour: 131 mg/dL (ref 65–152)
Glucose, Fasting: 72 mg/dL (ref 65–91)

## 2018-08-24 LAB — RPR: RPR Ser Ql: NONREACTIVE

## 2018-08-24 LAB — HIV ANTIBODY (ROUTINE TESTING W REFLEX): HIV Screen 4th Generation wRfx: NONREACTIVE

## 2018-08-26 LAB — CULTURE, OB URINE

## 2018-08-26 LAB — URINE CULTURE, OB REFLEX

## 2018-09-03 ENCOUNTER — Encounter (HOSPITAL_COMMUNITY): Payer: Self-pay | Admitting: *Deleted

## 2018-09-03 ENCOUNTER — Inpatient Hospital Stay (HOSPITAL_COMMUNITY)
Admission: AD | Admit: 2018-09-03 | Discharge: 2018-09-04 | Disposition: A | Discharge status: Home / Self Care | Payer: Medicaid Other | Source: Ambulatory Visit | Attending: Obstetrics & Gynecology | Admitting: Obstetrics & Gynecology

## 2018-09-03 DIAGNOSIS — B373 Candidiasis of vulva and vagina: Secondary | ICD-10-CM | POA: Diagnosis not present

## 2018-09-03 DIAGNOSIS — R109 Unspecified abdominal pain: Secondary | ICD-10-CM

## 2018-09-03 DIAGNOSIS — O98813 Other maternal infectious and parasitic diseases complicating pregnancy, third trimester: Secondary | ICD-10-CM | POA: Diagnosis not present

## 2018-09-03 DIAGNOSIS — O26893 Other specified pregnancy related conditions, third trimester: Secondary | ICD-10-CM | POA: Diagnosis present

## 2018-09-03 DIAGNOSIS — Z79899 Other long term (current) drug therapy: Secondary | ICD-10-CM | POA: Insufficient documentation

## 2018-09-03 DIAGNOSIS — Z3A29 29 weeks gestation of pregnancy: Secondary | ICD-10-CM | POA: Diagnosis not present

## 2018-09-03 DIAGNOSIS — B3731 Acute candidiasis of vulva and vagina: Secondary | ICD-10-CM

## 2018-09-03 DIAGNOSIS — R102 Pelvic and perineal pain: Secondary | ICD-10-CM | POA: Diagnosis present

## 2018-09-03 DIAGNOSIS — M549 Dorsalgia, unspecified: Secondary | ICD-10-CM | POA: Diagnosis present

## 2018-09-03 DIAGNOSIS — O2343 Unspecified infection of urinary tract in pregnancy, third trimester: Secondary | ICD-10-CM | POA: Diagnosis not present

## 2018-09-03 LAB — URINALYSIS, ROUTINE W REFLEX MICROSCOPIC
Bilirubin Urine: NEGATIVE
Glucose, UA: NEGATIVE mg/dL
Ketones, ur: 5 mg/dL — AB
Nitrite: NEGATIVE
Protein, ur: NEGATIVE mg/dL
Specific Gravity, Urine: 1.015 (ref 1.005–1.030)
pH: 6 (ref 5.0–8.0)

## 2018-09-03 LAB — WET PREP, GENITAL
Sperm: NONE SEEN
Trich, Wet Prep: NONE SEEN

## 2018-09-03 LAB — AMNISURE RUPTURE OF MEMBRANE (ROM) NOT AT ARMC: Amnisure ROM: NEGATIVE

## 2018-09-03 MED ORDER — NIFEDIPINE 10 MG PO CAPS
10.0000 mg | ORAL_CAPSULE | ORAL | Status: DC | PRN
  Administered 2018-09-03: 10 mg via ORAL
  Filled 2018-09-03: qty 1

## 2018-09-03 NOTE — MAU Provider Note (Signed)
Chief Complaint:  Contractions   First Provider Initiated Contact with Patient 09/03/18 2113      HPI: Katelyn Mcfarland is a 27 y.o. [id number] at [id number] who presents to maternity admissions reporting pelvic and back pain all day, with cramping/contractions every 2 minutes for an hour before she came in to MAU.  Since arriving in MAU, she reports the intermittent cramping/contractions in her abdomen have resolved but she has constant pain in her low pelvis, her back, and feels like she needs to have a bowel movement.  She reports episodes of leaking fluid x 3-4 today, enough to wet her underwear but not requiring a pad.  She has tried position changes and rest but it has not helped. She denies any other symptoms. She has no dysuria or constipation. She was treated a few weeks ago for UTI and completed her abx. She reports good fetal movement, denies vaginal bleeding, vaginal itching/burning, urinary symptoms, h/a, dizziness, n/v, or fever/chills.    HPI  Past Medical History: Past Medical History:  Diagnosis Date  . Allergy   . Chlamydia    age 27yo; hx/o trichomonas age 27yo  . Chronic headache   . Depression    doing ok, declined meds  . High cholesterol   . Infection    UTI  . Vaginal Pap smear, abnormal    bx, cryo    Past obstetric history: OB History  Gravida Para Term Preterm AB Living  6 2 2  0 3 2  SAB TAB Ectopic Multiple Live Births  2 1 0 0 2    # Outcome Date GA Lbr Len/2nd Weight Sex Delivery Anes PTL Lv  6 Current           5 TAB           4 SAB           3 Term     F Vag-Spont  N LIV  2 Term     F Vag-Spont  N LIV  1 SAB             Past Surgical History: Past Surgical History:  Procedure Laterality Date  . INDUCED ABORTION    . WISDOM TOOTH EXTRACTION      Family History: Family History  Problem Relation Age of Onset  . Hypertension Mother   . Diabetes Mother   . Asthma Sister   . Cancer Paternal Grandmother   . Cancer Paternal Grandfather   .  Cancer Maternal Grandmother   . Kidney disease Maternal Grandmother   . Diabetes Maternal Grandmother   . Heart disease Neg Hx   . Stroke Neg Hx     Social History: Social History   Tobacco Use  . Smoking status: Current Some Day Smoker    Packs/day: 0.50    Years: 16.00    Pack years: 8.00    Types: Cigarettes  . Smokeless tobacco: Never Used  . Tobacco comment: 2every other day  Substance Use Topics  . Alcohol use: Not Currently    Comment: not currently  . Drug use: No    Types: Marijuana    Comment: last Mar 2018    Allergies: No Known Allergies  Meds:  Medications Prior to Admission  Medication Sig Dispense Refill Last Dose  . acetaminophen (TYLENOL) 500 MG tablet Take 500 mg by mouth every 6 (six) hours as needed for mild pain.   Taking  . cephALEXin (KEFLEX) 500 MG capsule Take 1 capsule (500 mg total) by  mouth 4 (four) times daily. (Patient not taking: Reported on 08/23/2018) 28 capsule 0 Not Taking  . cyclobenzaprine (FLEXERIL) 10 MG tablet Take 1 tablet (10 mg total) by mouth 2 (two) times daily as needed for muscle spasms. 20 tablet 0 Taking  . Elastic Bandages & Supports (COMFORT FIT MATERNITY SUPP SM) MISC Wear as directed. 1 each 0 Taking  . famotidine-calcium carbonate-magnesium hydroxide (PEPCID COMPLETE) 10-800-165 MG chewable tablet Chew 1 tablet by mouth 2 (two) times daily as needed. 60 tablet 11 Taking  . glycopyrrolate (ROBINUL) 1 MG tablet Take 1 tablet (1 mg total) by mouth 3 (three) times daily. (Patient not taking: Reported on 08/23/2018) 90 tablet 3 Not Taking  . metoCLOPramide (REGLAN) 10 MG tablet Take 1 tablet (10 mg total) by mouth 4 (four) times daily -  before meals and at bedtime. (Patient not taking: Reported on 08/23/2018) 120 tablet 2 Not Taking  . nitrofurantoin, macrocrystal-monohydrate, (MACROBID) 100 MG capsule Take 1 capsule (100 mg total) by mouth 2 (two) times daily. (Patient not taking: Reported on 08/23/2018) 14 capsule 0 Not Taking   . ondansetron (ZOFRAN-ODT) 4 MG disintegrating tablet Take 1 tablet (4 mg total) by mouth every 8 (eight) hours. Take q8h x 14 days and then do q8h prn after that 40 tablet 0 Taking  . Prenat-Fe Poly-Methfol-FA-DHA (VITAFOL ULTRA) 29-0.6-0.4-200 MG CAPS Take 1 tablet by mouth daily before breakfast. 90 capsule 4 Taking  . promethazine (PHENERGAN) 25 MG suppository Place 1 suppository (25 mg total) rectally every 6 (six) hours as needed for nausea or vomiting. (Patient not taking: Reported on 08/23/2018) 12 each 3 Not Taking  . promethazine (PHENERGAN) 25 MG tablet Take 0.5-1 tablets (12.5-25 mg total) by mouth every 6 (six) hours as needed. (Patient not taking: Reported on 08/23/2018) 30 tablet 5 Not Taking  . pyridOXINE (VITAMIN B-6) 25 MG tablet Take 1 tablet (25 mg total) by mouth 2 (two) times daily. (Patient not taking: Reported on 07/26/2018) 60 tablet 1 Not Taking  . ranitidine (ZANTAC) 150 MG tablet Take 1 tablet (150 mg total) by mouth 2 (two) times daily. 60 tablet 5 Taking    ROS:  Review of Systems  Constitutional: Negative for chills, fatigue and fever.  Eyes: Negative for visual disturbance.  Respiratory: Negative for shortness of breath.   Cardiovascular: Negative for chest pain.  Gastrointestinal: Positive for abdominal pain. Negative for nausea and vomiting.  Genitourinary: Positive for pelvic pain. Negative for difficulty urinating, dysuria, flank pain, vaginal bleeding, vaginal discharge and vaginal pain.  Musculoskeletal: Positive for back pain.  Neurological: Negative for dizziness and headaches.  Psychiatric/Behavioral: Negative.      I have reviewed patient's Past Medical Hx, Surgical Hx, Family Hx, Social Hx, medications and allergies.   Physical Exam   Patient Vitals for the past 24 hrs:  BP Temp Pulse Resp Height Weight  09/03/18 2026 128/70 98.4 F (36.9 C) (!) 101 20 5' (1.524 m) 90.7 kg   Constitutional: Well-developed, well-nourished female in no acute  distress.  Cardiovascular: normal rate Respiratory: normal effort GI: Abd soft, non-tender, gravid appropriate for gestational age.  MS: Extremities nontender, no edema, normal ROM Neurologic: Alert and oriented x 4.  GU: Neg CVAT.  PELVIC EXAM: Cervix pink, visually closed, without lesion, scant white creamy discharge, vaginal walls and external genitalia normal  Dilation: Closed Effacement (%): Thick Cervical Position: Posterior Exam by:: L.Leftwich-Kirby,CNM     FHT:  Baseline 150 , moderate variability, accelerations present, no decelerations Contractions: None on  toco or to palpation   Labs:  O/Positive/-- (07/16 1114) Results for orders placed or performed during the hospital encounter of 09/03/18 (from the past 24 hour(s))  Urinalysis, Routine w reflex microscopic     Status: Abnormal   Collection Time: 09/03/18  8:59 PM  Result Value Ref Range   Color, Urine YELLOW YELLOW   APPearance HAZY (A) CLEAR   Specific Gravity, Urine 1.015 1.005 - 1.030   pH 6.0 5.0 - 8.0   Glucose, UA NEGATIVE NEGATIVE mg/dL   Hgb urine dipstick SMALL (A) NEGATIVE   Bilirubin Urine NEGATIVE NEGATIVE   Ketones, ur 5 (A) NEGATIVE mg/dL   Protein, ur NEGATIVE NEGATIVE mg/dL   Nitrite NEGATIVE NEGATIVE   Leukocytes, UA TRACE (A) NEGATIVE   RBC / HPF 6-10 0 - 5 RBC/hpf   WBC, UA 6-10 0 - 5 WBC/hpf   Bacteria, UA MANY (A) NONE SEEN   Squamous Epithelial / LPF 0-5 0 - 5   Mucus PRESENT   Amnisure rupture of membrane (rom)not at [facility]     Status: None   Collection Time: 09/03/18  9:29 PM  Result Value Ref Range   Amnisure ROM NEGATIVE   Wet prep, genital     Status: Abnormal   Collection Time: 09/03/18  9:29 PM  Result Value Ref Range   Yeast Wet Prep HPF POC PRESENT (A) NONE SEEN   Trich, Wet Prep NONE SEEN NONE SEEN   Clue Cells Wet Prep HPF POC PRESENT (A) NONE SEEN   WBC, Wet Prep HPF POC MODERATE (A) NONE SEEN   Sperm NONE SEEN    Imaging:  No results found.  MAU  Course/MDM: Wet prep and amnisure collected Wet prep indicates vaginal yeast infection Amnisure negative, Cervix 0/thick/high so no evidence of preterm labor today NST reviewed and reactive UA with leukocytes and WBCs  Will treat for yeast and UTI with Terazol 7 and Keflex 500 mg QID x 7 days Rest/ice/heat/warm bath/Tylenol/pregnancy support belt for abdominal/pelvic pain in pregnancy F/U with [facility] as scheduled Pt discharge with strict preterm labor/return precautions.  Today's evaluation included a work-up for preterm labor which can be life-threatening for both mom and baby.  Assessment:  1. UTI (urinary tract infection) during pregnancy, third trimester   2. Vaginal candidiasis   3. Abdominal pain during pregnancy in third trimester    Plan: Discharge home Labor precautions and fetal kick counts  Allergies as of 09/04/2018   No Known Allergies     Medication List    STOP taking these medications   glycopyrrolate 1 MG tablet Commonly known as:  ROBINUL   metoCLOPramide 10 MG tablet Commonly known as:  REGLAN   nitrofurantoin (macrocrystal-monohydrate) 100 MG capsule Commonly known as:  MACROBID   promethazine 25 MG suppository Commonly known as:  PHENERGAN   promethazine 25 MG tablet Commonly known as:  PHENERGAN     TAKE these medications   acetaminophen 500 MG tablet Commonly known as:  TYLENOL Take 500 mg by mouth every 6 (six) hours as needed for mild pain.   cephALEXin 500 MG capsule Commonly known as:  KEFLEX Take 1 capsule (500 mg total) by mouth 4 (four) times daily.   COMFORT FIT MATERNITY SUPP SM Misc Wear as directed.   cyclobenzaprine 10 MG tablet Commonly known as:  FLEXERIL Take 1 tablet (10 mg total) by mouth 2 (two) times daily as needed for muscle spasms.   famotidine-calcium carbonate-magnesium hydroxide 10-800-165 MG chewable tablet Commonly known as:  PEPCID COMPLETE Chew 1 tablet by mouth 2 (two) times daily as needed.    NIFEdipine 10 MG capsule Commonly known as:  PROCARDIA Take 1 capsule (10 mg total) by mouth every 6 (six) hours as needed (contractions).   ondansetron 4 MG disintegrating tablet Commonly known as:  ZOFRAN-ODT Take 1 tablet (4 mg total) by mouth every 8 (eight) hours. Take q8h x 14 days and then do q8h prn after that   pyridOXINE 25 MG tablet Commonly known as:  VITAMIN B-6 Take 1 tablet (25 mg total) by mouth 2 (two) times daily.   ranitidine 150 MG tablet Commonly known as:  ZANTAC Take 1 tablet (150 mg total) by mouth 2 (two) times daily.   terconazole 0.4 % vaginal cream Commonly known as:  TERAZOL 7 Place 1 applicator vaginally at bedtime.   VITAFOL ULTRA 29-0.6-0.4-200 MG Caps Take 1 tablet by mouth daily before breakfast.       Sharen Counter Certified Nurse-Midwife 09/03/2018 9:13 PM

## 2018-09-03 NOTE — MAU Note (Signed)
Having pain in pelvic area all day. Think I started contracting about 1900. Pain in lower back. Denies bleeding but thinks may be leaking fld. Feeling like has to have BM at times. Frequent urination and is uncomfortable but does not burn

## 2018-09-04 MED ORDER — CEPHALEXIN 500 MG PO CAPS: 500.0000 mg | ORAL_CAPSULE | Freq: Four times a day (QID) | ORAL | 0 refills | Status: DC

## 2018-09-04 MED ORDER — NIFEDIPINE 10 MG PO CAPS: 10.0000 mg | ORAL_CAPSULE | Freq: Four times a day (QID) | ORAL | 0 refills | Status: DC | PRN

## 2018-09-04 MED ORDER — TERCONAZOLE 0.4 % VA CREA: 1.0000 | TOPICAL_CREAM | Freq: Every day | VAGINAL | 0 refills | Status: DC

## 2018-09-06 ENCOUNTER — Ambulatory Visit (INDEPENDENT_AMBULATORY_CARE_PROVIDER_SITE_OTHER): Payer: Medicaid Other | Admitting: Advanced Practice Midwife

## 2018-09-06 VITALS — BP 125/75 | HR 101 | Wt 198.1 lb

## 2018-09-06 DIAGNOSIS — Z3A30 30 weeks gestation of pregnancy: Secondary | ICD-10-CM

## 2018-09-06 DIAGNOSIS — O2343 Unspecified infection of urinary tract in pregnancy, third trimester: Secondary | ICD-10-CM

## 2018-09-06 DIAGNOSIS — O479 False labor, unspecified: Secondary | ICD-10-CM

## 2018-09-06 DIAGNOSIS — Z3483 Encounter for supervision of other normal pregnancy, third trimester: Secondary | ICD-10-CM

## 2018-09-06 DIAGNOSIS — Z3480 Encounter for supervision of other normal pregnancy, unspecified trimester: Secondary | ICD-10-CM

## 2018-09-06 NOTE — Progress Notes (Signed)
Patient reports good fetal movement with irregular contractions and occasional pressure. Pt states that she has not picked up keflex from pharmacy yet.

## 2018-09-06 NOTE — Patient Instructions (Signed)
Third Trimester of Pregnancy The third trimester is from week 28 through week 40 (months 7 through 9). The third trimester is a time when the unborn baby (fetus) is growing rapidly. At the end of the ninth month, the fetus is about 20 inches in length and weighs 6-10 pounds. Body changes during your third trimester Your body will continue to go through many changes during pregnancy. The changes vary from woman to woman. During the third trimester:  Your weight will continue to increase. You can expect to gain 25-35 pounds (11-16 kg) by the end of the pregnancy.  You may begin to get stretch marks on your hips, abdomen, and breasts.  You may urinate more often because the fetus is moving lower into your pelvis and pressing on your bladder.  You may develop or continue to have heartburn. This is caused by increased hormones that slow down muscles in the digestive tract.  You may develop or continue to have constipation because increased hormones slow digestion and cause the muscles that push waste through your intestines to relax.  You may develop hemorrhoids. These are swollen veins (varicose veins) in the rectum that can itch or be painful.  You may develop swollen, bulging veins (varicose veins) in your legs.  You may have increased body aches in the pelvis, back, or thighs. This is due to weight gain and increased hormones that are relaxing your joints.  You may have changes in your hair. These can include thickening of your hair, rapid growth, and changes in texture. Some women also have hair loss during or after pregnancy, or hair that feels dry or thin. Your hair will most likely return to normal after your baby is born.  Your breasts will continue to grow and they will continue to become tender. A yellow fluid (colostrum) may leak from your breasts. This is the first milk you are producing for your baby.  Your belly button may stick out.  You may notice more swelling in your hands,  face, or ankles.  You may have increased tingling or numbness in your hands, arms, and legs. The skin on your belly may also feel numb.  You may feel short of breath because of your expanding uterus.  You may have more problems sleeping. This can be caused by the size of your belly, increased need to urinate, and an increase in your body's metabolism.  You may notice the fetus "dropping," or moving lower in your abdomen (lightening).  You may have increased vaginal discharge.  You may notice your joints feel loose and you may have pain around your pelvic bone.  What to expect at prenatal visits You will have prenatal exams every 2 weeks until week 36. Then you will have weekly prenatal exams. During a routine prenatal visit:  You will be weighed to make sure you and the baby are growing normally.  Your blood pressure will be taken.  Your abdomen will be measured to track your baby's growth.  The fetal heartbeat will be listened to.  Any test results from the previous visit will be discussed.  You may have a cervical check near your due date to see if your cervix has softened or thinned (effaced).  You will be tested for Group B streptococcus. This happens between 35 and 37 weeks.  Your health care provider may ask you:  What your birth plan is.  How you are feeling.  If you are feeling the baby move.  If you have had   any abnormal symptoms, such as leaking fluid, bleeding, severe headaches, or abdominal cramping.  If you are using any tobacco products, including cigarettes, chewing tobacco, and electronic cigarettes.  If you have any questions.  Other tests or screenings that may be performed during your third trimester include:  Blood tests that check for low iron levels (anemia).  Fetal testing to check the health, activity level, and growth of the fetus. Testing is done if you have certain medical conditions or if there are problems during the  pregnancy.  Nonstress test (NST). This test checks the health of your baby to make sure there are no signs of problems, such as the baby not getting enough oxygen. During this test, a belt is placed around your belly. The baby is made to move, and its heart rate is monitored during movement.  What is false labor? False labor is a condition in which you feel small, irregular tightenings of the muscles in the womb (contractions) that usually go away with rest, changing position, or drinking water. These are called Braxton Hicks contractions. Contractions may last for hours, days, or even weeks before true labor sets in. If contractions come at regular intervals, become more frequent, increase in intensity, or become painful, you should see your health care provider. What are the signs of labor?  Abdominal cramps.  Regular contractions that start at 10 minutes apart and become stronger and more frequent with time.  Contractions that start on the top of the uterus and spread down to the lower abdomen and back.  Increased pelvic pressure and dull back pain.  A watery or bloody mucus discharge that comes from the vagina.  Leaking of amniotic fluid. This is also known as your "water breaking." It could be a slow trickle or a gush. Let your health care provider know if it has a color or strange odor. If you have any of these signs, call your health care provider right away, even if it is before your due date. Follow these instructions at home: Medicines  Follow your health care provider's instructions regarding medicine use. Specific medicines may be either safe or unsafe to take during pregnancy.  Take a prenatal vitamin that contains at least 600 micrograms (mcg) of folic acid.  If you develop constipation, try taking a stool softener if your health care provider approves. Eating and drinking  Eat a balanced diet that includes fresh fruits and vegetables, whole grains, good sources of protein  such as meat, eggs, or tofu, and low-fat dairy. Your health care provider will help you determine the amount of weight gain that is right for you.  Avoid raw meat and uncooked cheese. These carry germs that can cause birth defects in the baby.  If you have low calcium intake from food, talk to your health care provider about whether you should take a daily calcium supplement.  Eat four or five small meals rather than three large meals a day.  Limit foods that are high in fat and processed sugars, such as fried and sweet foods.  To prevent constipation: ? Drink enough fluid to keep your urine clear or pale yellow. ? Eat foods that are high in fiber, such as fresh fruits and vegetables, whole grains, and beans. Activity  Exercise only as directed by your health care provider. Most women can continue their usual exercise routine during pregnancy. Try to exercise for 30 minutes at least 5 days a week. Stop exercising if you experience uterine contractions.  Avoid heavy   lifting.  Do not exercise in extreme heat or humidity, or at high altitudes.  Wear low-heel, comfortable shoes.  Practice good posture.  You may continue to have sex unless your health care provider tells you otherwise. Relieving pain and discomfort  Take frequent breaks and rest with your legs elevated if you have leg cramps or low back pain.  Take warm sitz baths to soothe any pain or discomfort caused by hemorrhoids. Use hemorrhoid cream if your health care provider approves.  Wear a good support bra to prevent discomfort from breast tenderness.  If you develop varicose veins: ? Wear support pantyhose or compression stockings as told by your healthcare provider. ? Elevate your feet for 15 minutes, 3-4 times a day. Prenatal care  Write down your questions. Take them to your prenatal visits.  Keep all your prenatal visits as told by your health care provider. This is important. Safety  Wear your seat belt at  all times when driving.  Make a list of emergency phone numbers, including numbers for family, friends, the hospital, and police and fire departments. General instructions  Avoid cat litter boxes and soil used by cats. These carry germs that can cause birth defects in the baby. If you have a cat, ask someone to clean the litter box for you.  Do not travel far distances unless it is absolutely necessary and only with the approval of your health care provider.  Do not use hot tubs, steam rooms, or saunas.  Do not drink alcohol.  Do not use any products that contain nicotine or tobacco, such as cigarettes and e-cigarettes. If you need help quitting, ask your health care provider.  Do not use any medicinal herbs or unprescribed drugs. These chemicals affect the formation and growth of the baby.  Do not douche or use tampons or scented sanitary pads.  Do not cross your legs for long periods of time.  To prepare for the arrival of your baby: ? Take prenatal classes to understand, practice, and ask questions about labor and delivery. ? Make a trial run to the hospital. ? Visit the hospital and tour the maternity area. ? Arrange for maternity or paternity leave through employers. ? Arrange for family and friends to take care of pets while you are in the hospital. ? Purchase a rear-facing car seat and make sure you know how to install it in your car. ? Pack your hospital bag. ? Prepare the baby's nursery. Make sure to remove all pillows and stuffed animals from the baby's crib to prevent suffocation.  Visit your dentist if you have not gone during your pregnancy. Use a soft toothbrush to brush your teeth and be gentle when you floss. Contact a health care provider if:  You are unsure if you are in labor or if your water has broken.  You become dizzy.  You have mild pelvic cramps, pelvic pressure, or nagging pain in your abdominal area.  You have lower back pain.  You have persistent  nausea, vomiting, or diarrhea.  You have an unusual or bad smelling vaginal discharge.  You have pain when you urinate. Get help right away if:  Your water breaks before 37 weeks.  You have regular contractions less than 5 minutes apart before 37 weeks.  You have a fever.  You are leaking fluid from your vagina.  You have spotting or bleeding from your vagina.  You have severe abdominal pain or cramping.  You have rapid weight loss or weight gain.    You have shortness of breath with chest pain.  You notice sudden or extreme swelling of your face, hands, ankles, feet, or legs.  Your baby makes fewer than 10 movements in 2 hours.  You have severe headaches that do not go away when you take medicine.  You have vision changes. Summary  The third trimester is from week 28 through week 40, months 7 through 9. The third trimester is a time when the unborn baby (fetus) is growing rapidly.  During the third trimester, your discomfort may increase as you and your baby continue to gain weight. You may have abdominal, leg, and back pain, sleeping problems, and an increased need to urinate.  During the third trimester your breasts will keep growing and they will continue to become tender. A yellow fluid (colostrum) may leak from your breasts. This is the first milk you are producing for your baby.  False labor is a condition in which you feel small, irregular tightenings of the muscles in the womb (contractions) that eventually go away. These are called Braxton Hicks contractions. Contractions may last for hours, days, or even weeks before true labor sets in.  Signs of labor can include: abdominal cramps; regular contractions that start at 10 minutes apart and become stronger and more frequent with time; watery or bloody mucus discharge that comes from the vagina; increased pelvic pressure and dull back pain; and leaking of amniotic fluid. This information is not intended to replace advice  given to you by your health care provider. Make sure you discuss any questions you have with your health care provider. Document Released: 09/23/2001 Document Revised: 03/06/2016 Document Reviewed: 11/30/2012 [employer] Interactive Patient Education  2017 [employer]  

## 2018-09-06 NOTE — Progress Notes (Signed)
   PRENATAL VISIT NOTE  Subjective:  Katelyn Mcfarland is a 27 y.o. [id number] at 2519w1d being seen today for ongoing prenatal care.  She is currently monitored for the following issues for this low-risk pregnancy and has Current smoker; Supervision of normal pregnancy, antepartum; GBS bacteriuria; Sickle cell trait (HCC); BMI 35-39; and Obesity in pregnancy on their problem list.  Patient reports cramping/contractions every night that resolve. 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  Contractions: Irregular. Vag. Bleeding: None.  Movement: Present. Denies leaking of fluid.   The following portions of the patient's history were reviewed and updated as appropriate: allergies, current medications, past family history, past medical history, past social history, past surgical history and problem list. Problem list updated.  Objective:   Vitals:   09/06/18 1057  BP: 125/75  Pulse: (!) 101  Weight: 89.9 kg    Fetal Status: Fetal Heart Rate (bpm): 150   Movement: Present     General:  Alert, oriented and cooperative. Patient is in no acute distress.  Skin: Skin is warm and dry. No rash noted.   Cardiovascular: Normal heart rate noted  Respiratory: Normal respiratory effort, no problems with respiration noted  Abdomen: Soft, gravid, appropriate for gestational age.  Pain/Pressure: Present     Pelvic: Cervical exam deferred        Extremities: Normal range of motion.  Edema: Trace  Mental Status: Normal mood and affect. Normal behavior. Normal judgment and thought content.   Assessment and Plan:  Pregnancy: [id number] at 3619w1d  1. Supervision of other normal pregnancy, antepartum --Anticipatory guidance about next visits/weeks of pregnancy given.  2. UTI (urinary tract infection) during pregnancy, third trimester --Keflex prescribed at [facility] visit 11/22. Pt has not picked up medications.   --Pt to pick up  medications as soon as possible.  3.  Braxton-Hicks Contractions --Pt with episodes of cramping every night, painful, frequent, but resolve and are better during the day.  Procardia was prescribed from previous [facility] visit but pt has not picked up. --Rest/ice/heat/warm bath/Tylenol/pregnancy support belt --Pick up Rx for Procardia to use during painful cramping. --Reviewed reasons to come to [facility]U.  Preterm labor symptoms and general obstetric precautions including but not limited to vaginal bleeding, contractions, leaking of fluid and fetal movement were reviewed in detail with the patient. Please refer to After Visit Summary for other counseling recommendations.  Return in about 4 weeks (around 10/04/2018).  Future Appointments  Date Time Provider Department Center  09/20/2018 11:15 AM Leftwich-Kirby, Wilmer FloorLisa A, CNM [facility] None    Sharen CounterLisa Leftwich-Kirby, CNM

## 2018-09-13 IMAGING — US US OB COMP LESS 14 WK
1 series · 15 of 28 positions shown · non-contrast
Comparison: None.

CLINICAL DATA: Abdominal pain and cramping in first trimester
pregnancy.

EXAM:
OBSTETRIC <14 WK US AND TRANSVAGINAL OB US
TECHNIQUE: Both transabdominal and transvaginal ultrasound examinations were
performed for complete evaluation of the gestation as well as the
maternal uterus, adnexal regions, and pelvic cul-de-sac.
Transvaginal technique was performed to assess early pregnancy.

[Series 1: us ob comp less 14 wk · 15 of 38 slices shown]
[im 1/38]
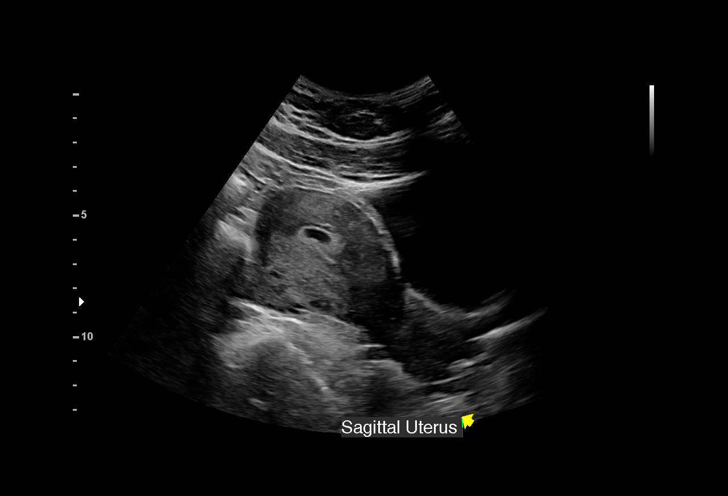
[im 3/38]
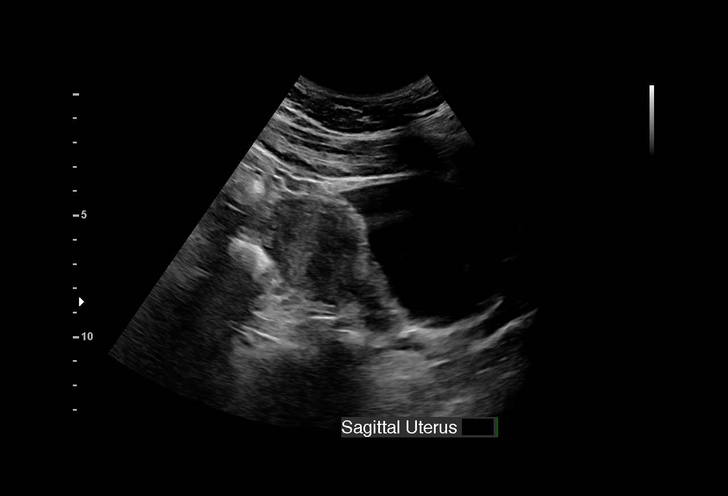
[im 6/38]
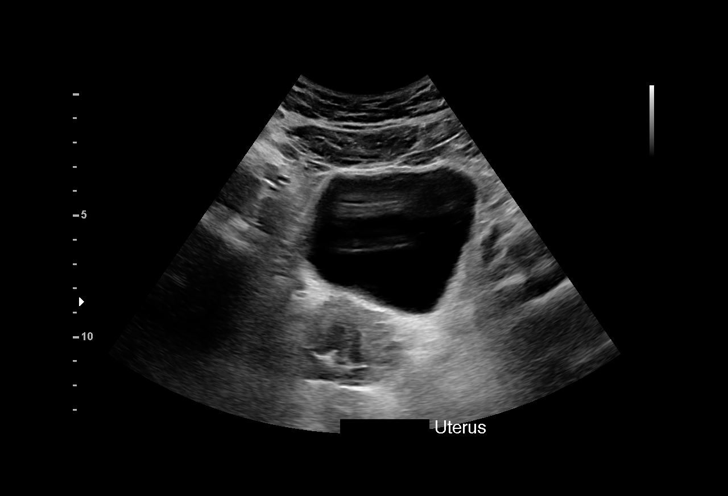
[im 9/38]
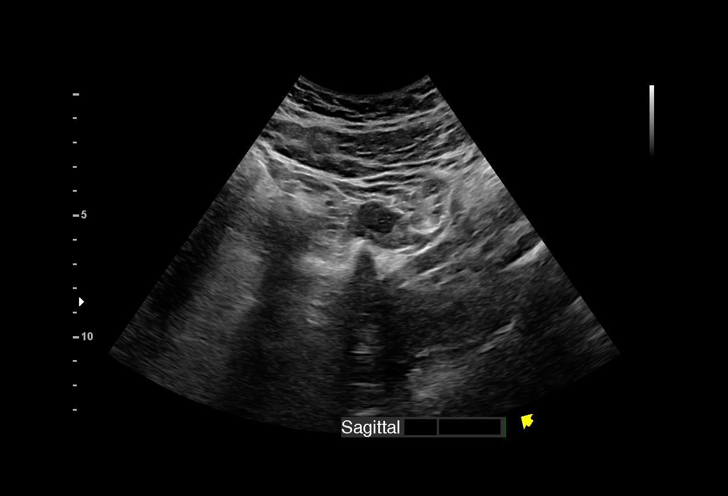
[im 11/38]
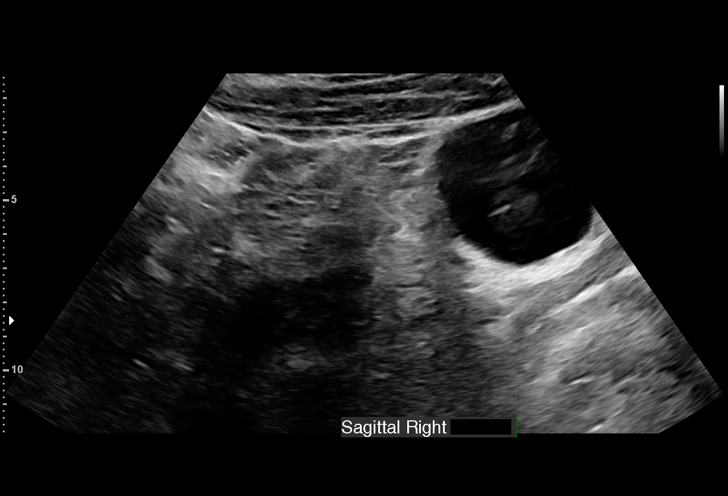
[im 14/38]
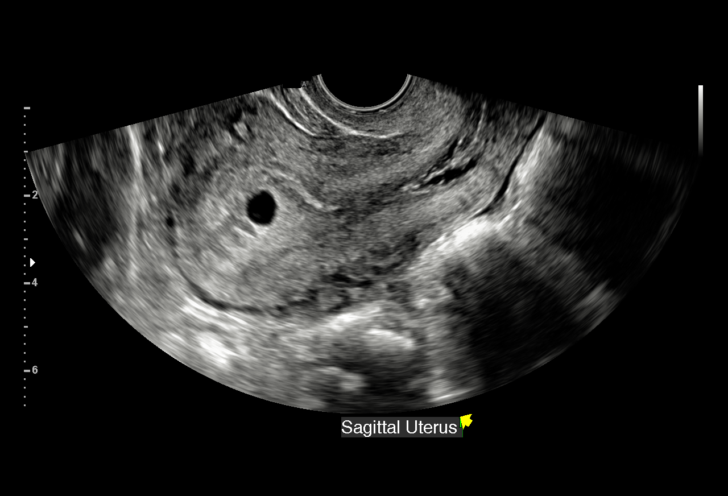
[im 17/38]
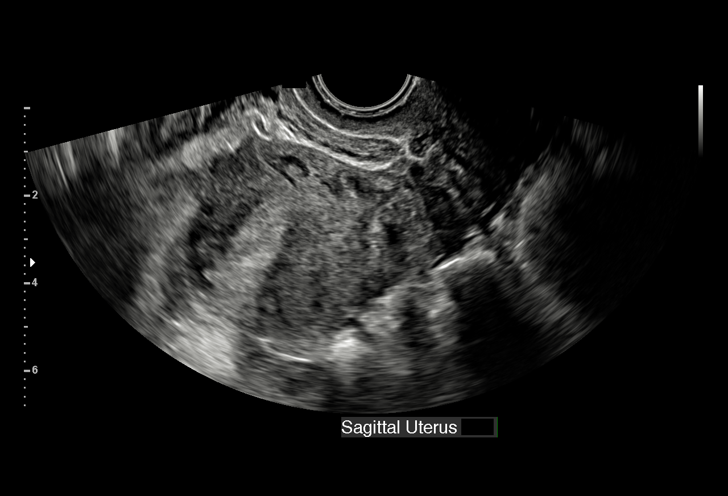
[im 20/38]
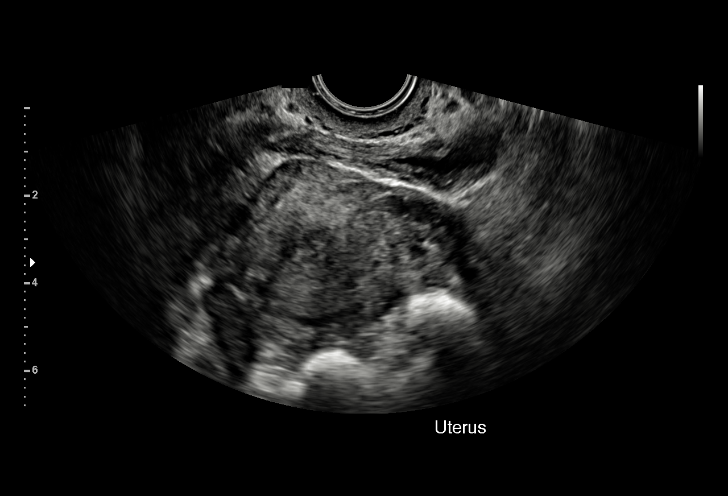
[im 21/38]
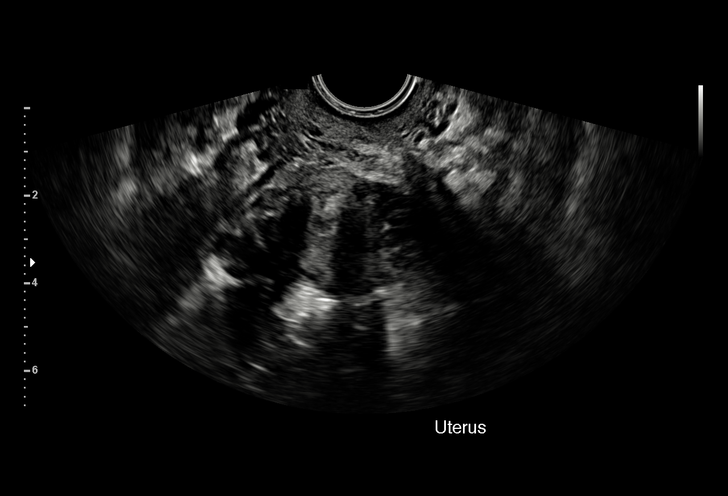
[im 24/38]
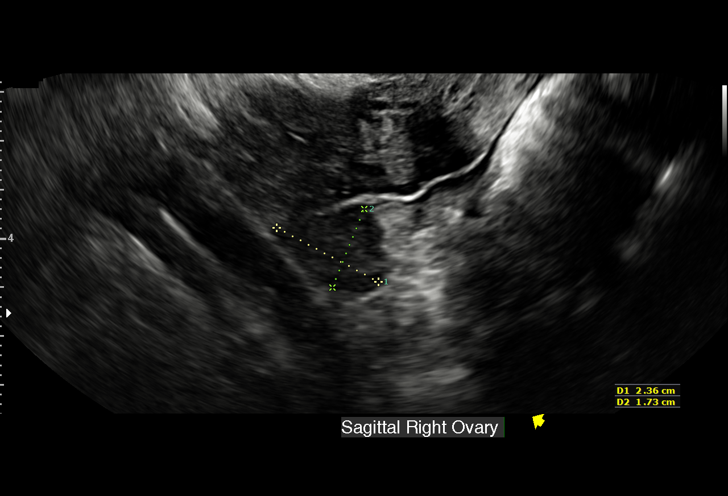
[im 27/38]
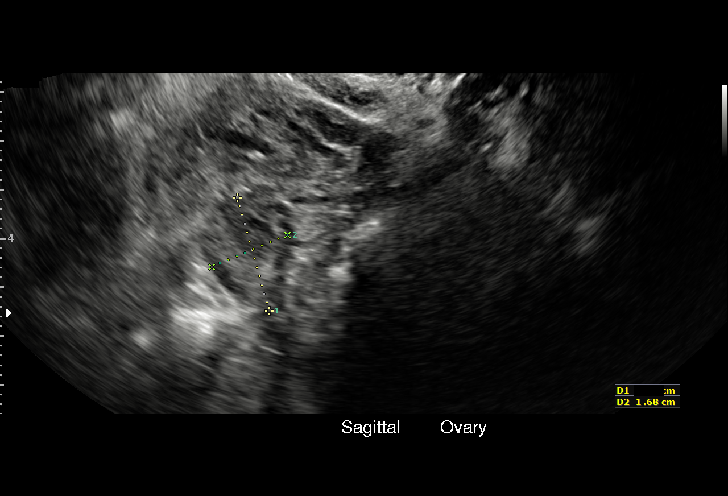
[im 29/38]
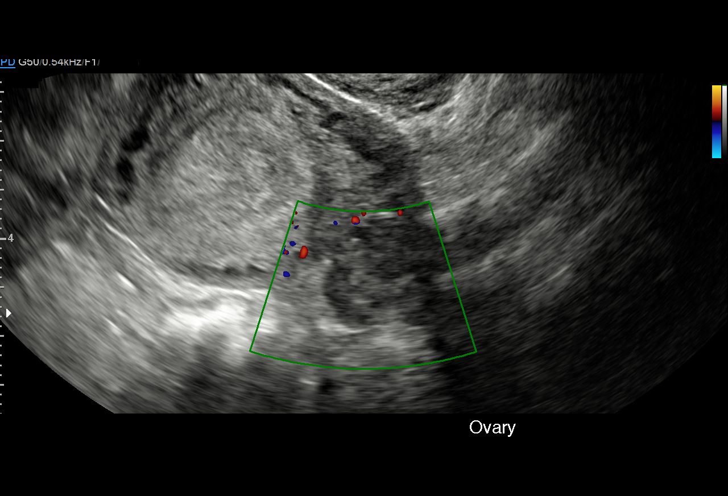
[im 32/38]
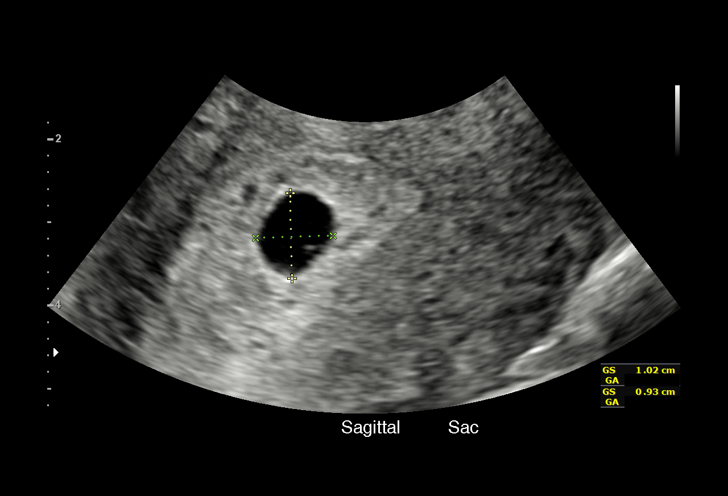
[im 35/38]
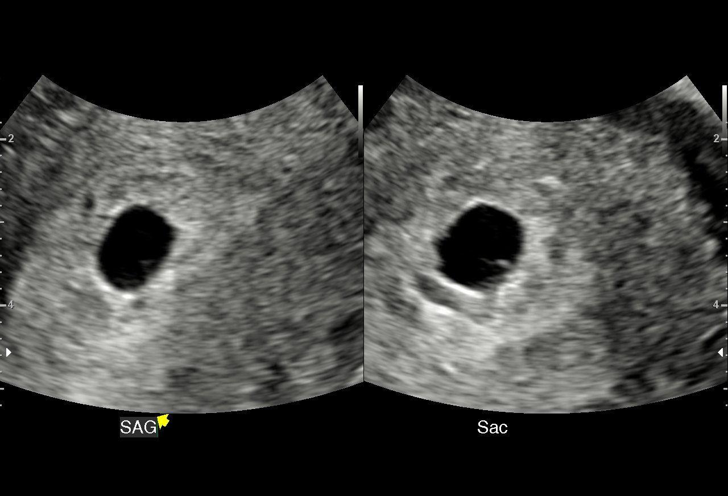
[im 38/38]
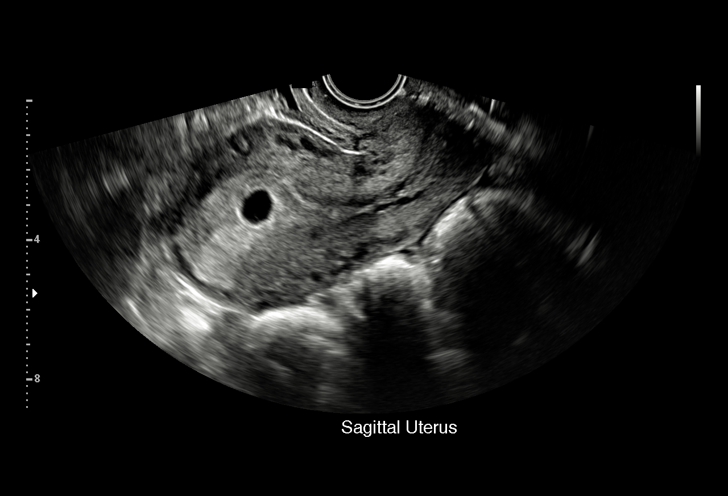

[15 of 28 positions shown; findings below may reference images not displayed]

FINDINGS: Intrauterine gestational sac: Single

Yolk sac:  Visualized.

Embryo:  Not Visualized.

MSD: 10  mm   5 w   5  d

Subchorionic hemorrhage:  None visualized.

Maternal uterus/adnexae: Normal appearance of both ovaries. No mass
or free fluid identified.
IMPRESSION: Single intrauterine gestational sac with estimated gestational age
of 5 weeks 5 days by mean sac diameter. Consider following
quantitative beta HCG levels, with followup ultrasound to assess
viability in 10-14 days.

## 2018-09-15 ENCOUNTER — Telehealth: Payer: Self-pay

## 2018-09-15 NOTE — Telephone Encounter (Signed)
Returned call and gave pt info about water birth

## 2018-09-20 ENCOUNTER — Ambulatory Visit (INDEPENDENT_AMBULATORY_CARE_PROVIDER_SITE_OTHER): Payer: Medicaid Other | Admitting: Advanced Practice Midwife

## 2018-09-20 ENCOUNTER — Encounter: Payer: Self-pay | Admitting: Advanced Practice Midwife

## 2018-09-20 VITALS — BP 117/75 | HR 97 | Wt 201.9 lb

## 2018-09-20 DIAGNOSIS — R102 Pelvic and perineal pain: Secondary | ICD-10-CM

## 2018-09-20 DIAGNOSIS — R8271 Bacteriuria: Secondary | ICD-10-CM

## 2018-09-20 DIAGNOSIS — O9989 Other specified diseases and conditions complicating pregnancy, childbirth and the puerperium: Secondary | ICD-10-CM

## 2018-09-20 DIAGNOSIS — O219 Vomiting of pregnancy, unspecified: Secondary | ICD-10-CM

## 2018-09-20 DIAGNOSIS — O26893 Other specified pregnancy related conditions, third trimester: Secondary | ICD-10-CM

## 2018-09-20 DIAGNOSIS — Z3483 Encounter for supervision of other normal pregnancy, third trimester: Secondary | ICD-10-CM

## 2018-09-20 DIAGNOSIS — Z3480 Encounter for supervision of other normal pregnancy, unspecified trimester: Secondary | ICD-10-CM

## 2018-09-20 DIAGNOSIS — M7918 Myalgia, other site: Secondary | ICD-10-CM

## 2018-09-20 DIAGNOSIS — O99891 Other specified diseases and conditions complicating pregnancy: Secondary | ICD-10-CM

## 2018-09-20 MED ORDER — CYCLOBENZAPRINE HCL 10 MG PO TABS: 10.0000 mg | ORAL_TABLET | Freq: Two times a day (BID) | ORAL | 0 refills | Status: DC | PRN

## 2018-09-20 MED ORDER — METOCLOPRAMIDE HCL 10 MG PO TABS: 10.0000 mg | ORAL_TABLET | Freq: Three times a day (TID) | ORAL | 2 refills | Status: DC

## 2018-09-20 NOTE — Progress Notes (Signed)
   PRENATAL VISIT NOTE  Subjective:  Katelyn Mcfarland is a 27 y.o. [id number] at 3770w1d being seen today for ongoing prenatal care.  She is currently monitored for the following issues for this low-risk pregnancy and has Current smoker; Supervision of normal pregnancy, antepartum; GBS bacteriuria; Sickle cell trait (HCC); BMI 35-39; and Obesity in pregnancy on their problem list.  Patient reports pelvic pain with position change, constant pelvic pressure.  Contractions: Not present. Vag. Bleeding: None.  Movement: Present. Denies leaking of fluid.   The following portions of the patient's history were reviewed and updated as appropriate: allergies, current medications, past family history, past medical history, past social history, past surgical history and problem list. Problem list updated.  Objective:   Vitals:   09/20/18 1113  BP: 117/75  Pulse: 97  Weight: 91.6 kg    Fetal Status: Fetal Heart Rate (bpm): 154   Movement: Present     General:  Alert, oriented and cooperative. Patient is in no acute distress.  Skin: Skin is warm and dry. No rash noted.   Cardiovascular: Normal heart rate noted  Respiratory: Normal respiratory effort, no problems with respiration noted  Abdomen: Soft, gravid, appropriate for gestational age.  Pain/Pressure: Present     Pelvic: Cervical exam deferred        Extremities: Normal range of motion.  Edema: Trace  Mental Status: Normal mood and affect. Normal behavior. Normal judgment and thought content.   Assessment and Plan:  Pregnancy: [id number] at 6170w1d  1. Supervision of other normal pregnancy, antepartum --Anticipatory guidance about next visits/weeks of pregnancy given.  2. GBS bacteriuria --Treat in labor  3. Pain in symphysis pubis during pregnancy --Pain with movement, position change. Difficult to change positions on exam table in office.  Pain is c/w pubic symphysis/pelvic girdle pain.  --Printed and verbal teaching about pelvic pain in  pregnancy.   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 - Ambulatory referral to Physical Therapy  4. Nausea and vomiting during pregnancy --Pt taking Phenergan PRN which helps but cannot take at work. - metoCLOPramide (REGLAN) 10 MG tablet; Take 1 tablet (10 mg total) by mouth 3 (three) times daily before meals.  Dispense: 90 tablet; Refill: 2  5. Pelvic pain affecting pregnancy in third trimester, antepartum --Muscle spasms in low abdomen/low back related to pubic symphysis/pelvic girdle pain. - cyclobenzaprine (FLEXERIL) 10 MG tablet; Take 1 tablet (10 mg total) by mouth 2 (two) times daily as needed for muscle spasms.  Dispense: 20 tablet; Refill: 0  Preterm labor symptoms and general obstetric precautions including but not limited to vaginal bleeding, contractions, leaking of fluid and fetal movement were reviewed in detail with the patient. Please refer to After Visit Summary for other counseling recommendations.  Return in about 2 weeks (around 10/04/2018).  Future Appointments  Date Time Provider Department Center  10/04/2018  3:45 PM Leftwich-Kirby, Wilmer FloorLisa A, CNM [facility] None    Sharen CounterLisa Leftwich-Kirby, CNM

## 2018-09-20 NOTE — Patient Instructions (Addendum)
Pubic Symphysis Pain/Dysfunction ° °What is symphysis pubis dysfunction? ° °Symphysis pubis dysfunction (SPD) is a problem with the pelvis. Your pelvis is mainly formed of two pubic bones that curve round to make a cradle shape. The pubic bones meet at the front of your pelvis, at a firm joint called the symphysis pubis.  ° °The joint's connection is made strong by a dense network of tough tissues (ligaments). During pregnancy, swelling and pain can make the symphysis pubis joint less stable, causing SPD. ° °Doctors and physiotherapists classify any type of pelvic pain during pregnancy as pelvic girdle pain (PGP).  ° °SPD is one type of pelvic girdle pain. Diastasis symphysis pubis (DSP) is another type of pelvic girdle pain, which is related to SPD. DSP happens when the gap in the symphysis pubis joint widens too far. DSP is rare, and can only be diagnosed by an X-ray, ultrasound scan or MRI scan. What are the symptoms of SPD? Pain in the pubic area and groin are the most common symptoms, though you may also notice:  ° °Back pain, pain at the back of your pelvis or hip pain.  °Pain, along with a grinding or clicking sensation in your pubic area.  °Pain down the inside of your thighs or between your legs.  °Pain that's made worse by parting your legs, walking, going up or down stairs or moving around in bed.  °Pain that's worse at night and stops you from sleeping well. Getting up to go to the toilet in the middle of the night can be especially painful. ° °SPD can occur at any time during your pregnancy or after giving birth. You may notice it for the first time during the middle of your pregnancy. What causes SPD?During pregnancy, your body produces a hormone called relaxin, which softens your ligaments to help your baby pass through your pelvis. This means that the joints in your pelvis naturally become more lax.  ° °However, this flexibility doesn't necessarily cause the painful problems of SPD. Usually, your  nerves and muscles are able to adapt and compensate for the greater flexibility in your joints. This means your body should cope well with the changes to your posture as your baby grows. ° °SPD is thought to happen when your body doesn't adapt so well to the stretchier, looser ligaments caused by relaxin. SPD can be triggered by: ° °the joints in your pelvis moving unevenly  °changes to the way your muscles work to support your pelvic girdle joints  °one pelvic joint not working properly and causing knock-on pain in the other joints of your pelvis °These problems mean that your pelvis is not as stable as it should be, and this is what causes SPD. Physiotherapy is the best way to treat SPD, because it's about the relationship between your muscles and bones, rather than how lax your joints are. You're more likely to develop SPD if: ° °you had pelvic girdle pain or pelvic joint pain before you became pregnant  °you've had a previous injury to your pelvis  °you've had pelvic girdle pain in a previous pregnancy  °you have a high BMI and were overweight before you became pregnant  °hypermobility in all your joints  °How is SPD diagnosed? Your doctor or midwife should refer you to a women’s health physiotherapist. Your physiotherapist will test the stability, movement and pain in your pelvic joints and muscles. How is SPD treated?SPD is managed in the same way as other pelvic girdle pain. Treatment includes:  ° °  Exercises to strengthen your spinal, tummy, pelvic girdle, hip and pelvic floor muscles. These will improve the stability of your pelvis and back. You may need gentle, hands-on treatment of your hip, back or pelvis to correct stiffness or imbalance. Water gymnastics can sometimes help.  °Your physiotherapist should advise you on how to make daily activities less painful and on how to make the birth of your baby easier. Your midwife should help you to write a birth plan that takes into account your SPD symptoms.    °Acupuncture may help reduce the pain and is safe during pregnancy. Make sure your practitioner is trained and experienced in working with pregnant women.  °Other manual therapies, such as osteopathy may help. See a registered practitioner who is experienced in treating pregnant women.  °A pelvic support belt may give relief, particularly when you're exercising or active. °What can I do to ease the pain of SPD?  °Be as active as you can, but don't push yourself so far that it hurts.  °Stick to the pelvic floor and tummy exercises that your physiotherapist recommends.  °Ask for and accept offers of help with daily chores.  °Plan ahead so that you reduce the activities that cause you problems. You could use a rucksack to carry things around, both indoors and out.  °Take care to part your legs no further than your pain-free range, particularly when getting in and out of the car, bed or bath. If you are lying down, pull up your knees as far as you can to make it easier to part your legs. If you are sitting, try arching your back and sticking your chest out before parting or moving your legs.  °Avoid activities that make your pain worse or that put your pelvis in an uneven position, such as sitting cross-legged or carrying your toddler on your hip. If something hurts, stop doing it. If the pain is allowed to flare up, it can take a long time to settle down again.  °Try to sleep on your side with legs bent and a pillow between your knees.  °Rest regularly or sit down for activities you would normally do standing, such as ironing. By sitting on a birth ball or by getting down on your hands and knees, you'll take the weight of your baby off your pelvis.  °Try not to do heavy lifting or pushing. Pushing supermarket trolleys can often make your pain worse, so shop online or ask someone to shop for you.  °When climbing stairs, take one step at a time. Step up onto one step with your best leg and then bring your other leg to  meet it. Repeat with each step.  °Avoid standing on one leg. When getting dressed, sit down to pull on your knickers or trousers. °Will I recover from SPD after I’ve had my baby ? You’re very likely to recover within a few weeks to a few months after your baby is born. If you can, carry on with physiotherapy after the birth. Try to get help with looking after your baby during the early weeks.  ° °You may find you get twinges every month just before your period is due. This is likely to be caused by hormones that have a similar effect to pregnancy hormones.  ° °If you have SPD in one pregnancy, it is more likely that you’ll have it next time you get pregnant. Ask your midwife to refer you to a physiotherapist early on. SPD may not necessarily be   as bad next time if it is well managed from the start of pregnancy.  ° °You could consider giving yourself a bit of time from one pregnancy to the next. Losing excess weight, getting fit and waiting until your children can walk may help reduce the symptoms of getting any type of pelvic pain next time. Where can I get help and support?[facility] can provide a list of physiotherapists in your area. ° °You can get in touch with other women in your situation by contacting [employer], a charity that offers support to women with pelvic girdle pain, including SPD, or by visiting our community.  ° °More tips and advice: ° °Go to http://www.babycentre.co.uk/a546492/pelvic-pain-spd for more information ° °See our photo guide to pregnancy stretches, designed to help relieve those aches and pains.  °Watch our video for tips on how to get comfortable in bed  °Discover how SPD might affect your labour. ° °Last reviewed: July 2015  °Next review: July 2018 °

## 2018-09-27 ENCOUNTER — Ambulatory Visit: Payer: Medicaid Other | Attending: Advanced Practice Midwife | Admitting: Physical Therapy

## 2018-09-27 ENCOUNTER — Encounter: Payer: Self-pay | Admitting: Physical Therapy

## 2018-09-27 ENCOUNTER — Other Ambulatory Visit: Payer: Self-pay

## 2018-09-27 DIAGNOSIS — R262 Difficulty in walking, not elsewhere classified: Secondary | ICD-10-CM | POA: Diagnosis present

## 2018-09-27 DIAGNOSIS — M6281 Muscle weakness (generalized): Secondary | ICD-10-CM | POA: Diagnosis present

## 2018-09-27 NOTE — Patient Instructions (Signed)
Access Code: [email or web address]  URL: [email or web address]  Date: 09/27/2018  Prepared by: Dorie RankJacqueline Crosser   Exercises  Ball squeeze with Kegel - 10 reps - 1 sets - 3 sec hold - 3x daily - 7x weekly  Seated Pelvic Floor Contraction with Isometric Hip Adduction - 10 reps - 1 sets - 3 sechold, 3 sec rest hold - 3x daily - 7x weekly  Seated Flexion Stretch - 1 reps - 1 sets - 20 sec hold - 5x daily - 7x weekly

## 2018-09-27 NOTE — Therapy (Signed)
[facility] [address] [place], [place], [address] Phone: [phone]   Fax:  [phone]  Physical Therapy Evaluation  Patient Details  Name: Katelyn Mcfarland MRN: [id number] Date of Birth: 1990/11/08 Referring Provider (PT): Hurshel Party, CNM   Encounter Date: 09/27/2018  PT End of Session - 09/27/18 1731    Visit Number  1    Date for PT Re-Evaluation  11/22/18    Authorization Type  medicaid    PT Start Time  1015    PT Stop Time  1051    PT Time Calculation (min)  36 min    Activity Tolerance  Patient tolerated treatment well;Patient limited by pain       Past Medical History:  Diagnosis Date  . Allergy   . Chlamydia    age 27yo; hx/o trichomonas age 27yo  . Chronic headache   . Depression    doing ok, declined meds  . High cholesterol   . Infection    UTI  . Vaginal Pap smear, abnormal    bx, cryo    Past Surgical History:  Procedure Laterality Date  . INDUCED ABORTION    . WISDOM TOOTH EXTRACTION      There were no vitals filed for this visit.   Subjective Assessment - 09/27/18 1017    Subjective  Pt reports pain has been crippling, some days only able to get outof bed and sleeping only 2 hours.  Pt has had for a couple months, she is [redacted] weeks pregnant.    Limitations  Standing;Walking    How long can you walk comfortably?  barely any time    Currently in Pain?  Yes    Pain Score  7     Pain Location  Abdomen    Pain Orientation  Lower    Pain Descriptors / Indicators  Pressure    Aggravating Factors   Nothing, everything hurts; getting up from bed    Pain Relieving Factors  muscle relaxers to help sleep    Multiple Pain Sites  No         [facility] PT Assessment - 09/27/18 0001      Assessment   Medical Diagnosis  O99.89,M79.18 (ICD-10-CM) - Pain in symphysis pubis during pregnancy    Referring Provider (PT)  Leftwich-Kirby, Wilmer Floor, CNM    Onset Date/Surgical Date  --    a couple of months ago   Prior Therapy  No      Precautions   Precautions  None      Restrictions   Weight Bearing Restrictions  No      Balance Screen   Has the patient fallen in the past 6 months  No      Home Environment   Living Environment  Private residence    Living Arrangements  Children;Parent   mom; 2 daughters 8 and 10     Prior Function   Level of Independence  Independent    Vocation  Full time employment    Vocation Requirements  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; sitting      Cognition   Overall Cognitive Status  Within Functional Limits for tasks assessed      Posture/Postural Control   Posture/Postural Control  Postural limitations    Postural Limitations  Increased lumbar lordosis;Rounded Shoulders;Increased thoracic kyphosis      ROM / Strength   AROM / PROM / Strength  PROM;Strength;AROM      AROM   Overall  AROM Comments  lumbar flexion 50% limited      PROM   Overall PROM Comments  hip limited 50% bialteral - ER, IR, flexion      Strength   Overall Strength Comments  hip flexion, abduction, adduction - 4/5 MMT bilaterally      Flexibility   Soft Tissue Assessment /Muscle Length  yes    Hamstrings  unable to test due to restiction    Piriformis  25% limited bilaterally      Palpation   Palpation comment  lumbar paraspinals spasms and fascial restriction      Bed Mobility   Bed Mobility  Sit to Supine    Sit to Supine  Supervision/Verbal cueing      Ambulation/Gait   Gait Pattern  Decreased stride length;Wide base of support                Objective measurements completed on examination: See above findings.    Pelvic Floor Special Questions - 09/27/18 0001    Prior Pelvic/Prostate Exam  Yes    Prior Pregnancies  Yes    Number of Vaginal Deliveries  2    Any difficulty with labor and deliveries  Yes    Urinary Leakage  Yes   feel it go down the legs   How often  every other day    Activities that cause leaking  --   everything    Urinary urgency  Yes    Fecal incontinence  No       [facility] Adult PT Treatment/Exercise - 09/27/18 0001      Self-Care   Self-Care  Other Self-Care Comments    Other Self-Care Comments   initial HEP             PT Education - 09/27/18 1730    Education Details   Access Code: [email or web address]     Person(s) Educated  Patient    Methods  Explanation;Demonstration;Handout;Verbal cues    Comprehension  Verbalized understanding;Returned demonstration          PT Long Term Goals - 09/27/18 1732      PT LONG TERM GOAL #1   Title  pt will be able to stay on her feet for at least 15 minutes at a time due to improved core and pelvic floor strength    Baseline  sometimes unable to get up due to pain    Time  8    Period  Weeks    Status  New    Target Date  11/22/18      PT LONG TERM GOAL #2   Title  pt will be ind with advanced HEP for healthy pregnancy    Baseline  does not know    Time  8    Period  Weeks    Status  New    Target Date  11/22/18      PT LONG TERM GOAL #3   Title  pt will report all daily activities are tolerable without having to be bedbound due to pain    Baseline  pt cannot get up some days    Time  8    Period  Weeks    Status  New    Target Date  11/22/18             Plan - 09/27/18 1720    Clinical Impression Statement  Pt presents to clinic due to increasing pubic pain during 3rd trimester of pregnancy.  Pt demosntrates pelvic floor  weakness assessed externally.  Pt has LE and hip weakness with decreased ROM.  Pt has mucle spasms throughout lumbar paraspinals.  Pt demonstrates weakness of hip adductors.  Due to pregnancy, she is predisposed to excessive joint laxity and strengthening will assist with improved mobility and support to joints.  Pt will benefit from skilled PT to learn how to strengthen and address impairments for improved function and health during pregnancy.    History and Personal Factors relevant to plan of care:  2 previous  vaginal deliveries, 3rd trimester    Clinical Presentation  Evolving    Clinical Presentation due to:  pt is getting worse with advanced pregnancy    Clinical Decision Making  Moderate    Rehab Potential  Good    PT Frequency  1x / week    PT Duration  8 weeks    PT Treatment/Interventions  ADLs/Self Care Home Management;Electrical Stimulation;Biofeedback;Cryotherapy;Moist Heat;Therapeutic activities;Therapeutic exercise;Gait training;Functional mobility training;Neuromuscular re-education;Patient/family education;Manual techniques;Passive range of motion;Taping    PT Next Visit Plan  taping, lumbar stretch, pelvic floor and TrA strength    PT Home Exercise Plan   Access Code: [email or web address]     Recommended Other Services  eval 09/27/18    Consulted and Agree with Plan of Care  Patient       Patient will benefit from skilled therapeutic intervention in order to improve the following deficits and impairments:  Abnormal gait, Pain, Improper body mechanics, Increased fascial restricitons, Decreased range of motion, Decreased strength  Visit Diagnosis: Muscle weakness (generalized)  Difficulty in walking, not elsewhere classified     Problem List Patient Active Problem List   Diagnosis Date Noted  . Sickle cell trait (HCC) 05/01/2018  . BMI 35-39 05/01/2018  . Obesity in pregnancy 05/01/2018  . GBS bacteriuria 04/28/2018  . Supervision of normal pregnancy, antepartum 04/27/2018  . Current smoker 01/11/2014    Vincente PoliJakki Crosser, PT 09/27/2018, 5:48 PM  [facility] [address] [place], [place], [address] Phone: [phone]   Fax:  [phone]  Name: Katelyn Mcfarland MRN: [id number] Date of Birth: 02-19-1991

## 2018-09-28 ENCOUNTER — Telehealth: Payer: Self-pay | Admitting: *Deleted

## 2018-09-28 NOTE — Telephone Encounter (Signed)
Pt called to office stating she needs to discuss work note and swelling.   Return call to pt.  Pt states she is having trouble with her employer and recent letter that was given. Pt states that her employer will not accept her being able to be away from work every 2 hours for a 5 minute bathroom break.  Pt states that she is on a 12 hour shift by herself, no one to relieve her for breaks.  Pt states employer told her it would be abandonment if she were to leave area unapproved.   Pt made aware that she may ask her employer for Accomodation paperwork and our office can complete for her.  Pt to follow up with office re: paperwork.  Pt states she is having some swelling in LE.  Pt denies any other s/s, no HA/vision changes. Pt advised to stay well hydrated and elevate when able. Pt to make office aware if symptoms worsen.

## 2018-10-01 ENCOUNTER — Ambulatory Visit: Payer: Medicaid Other | Admitting: Physical Therapy

## 2018-10-01 ENCOUNTER — Encounter: Payer: Self-pay | Admitting: Physical Therapy

## 2018-10-01 DIAGNOSIS — M6281 Muscle weakness (generalized): Secondary | ICD-10-CM

## 2018-10-01 DIAGNOSIS — R262 Difficulty in walking, not elsewhere classified: Secondary | ICD-10-CM

## 2018-10-01 NOTE — Therapy (Addendum)
[facility] [address], [address] [place], [place], [address] Phone: [phone]   Fax:  [phone]  Physical Therapy Treatment  Patient Details  Name: Katelyn Mcfarland MRN: [id number] Date of Birth: 12/27/1990 Referring Provider (PT): Leftwich-Kirby, Lisa A, CNM   Encounter Date: 10/01/2018  PT End of Session - 10/01/18 1103    Visit Number  2    Date for PT Re-Evaluation  11/22/18    Authorization Type  medicaid    PT Start Time  1104    PT Stop Time  1142    PT Time Calculation (min)  38 min    Activity Tolerance  Patient tolerated treatment well;Patient limited by pain    Behavior During Therapy  WFL for tasks assessed/performed       Past Medical History:  Diagnosis Date  . Allergy   . Chlamydia    age 11yo; hx/o trichomonas age 16yo  . Chronic headache   . Depression    doing ok, declined meds  . High cholesterol   . Infection    UTI  . Vaginal Pap smear, abnormal    bx, cryo    Past Surgical History:  Procedure Laterality Date  . INDUCED ABORTION    . WISDOM TOOTH EXTRACTION      There were no vitals filed for this visit.  Subjective Assessment - 10/01/18 1158    Subjective  Pt states she almost did not come today due to being unable to get out of bed.  Pt states she has been doing the stretch over the desk and that feels good.    Limitations  Standing;Walking    How long can you walk comfortably?  barely any time    Currently in Pain?  Yes    Pain Score  7     Pain Location  Abdomen    Pain Orientation  Lower    Pain Descriptors / Indicators  Pressure    Pain Type  Acute pain    Aggravating Factors   be mobilty    Multiple Pain Sites  No                       [facility] Adult PT Treatment/Exercise - 10/01/18 0001      Exercises   Exercises  Lumbar      Lumbar Exercises: Seated   Other Seated Lumbar Exercises  ball roll out    Other Seated Lumbar Exercises  sitting on pball  doing pelvic tilts, seated on mat ball squeeze with pelvic tilt - 20x each 3 sec hold      Manual Therapy   Manual Therapy  Soft tissue mobilization    Manual therapy comments  sidelying    Soft tissue mobilization  lumbar and thoracic paraspinlas bilateral, bilat gutes and TFL/ITbands                  PT Long Term Goals - 09/27/18 1732      PT LONG TERM GOAL #1   Title  pt will be able to stay on her feet for at least 15 minutes at a time due to improved core and pelvic floor strength    Baseline  sometimes unable to get up due to pain    Time  8    Period  Weeks    Status  New    Target Date  11/22/18      PT LONG TERM GOAL #2   Title    pt will be ind with advanced HEP for healthy pregnancy    Baseline  does not know    Time  8    Period  Weeks    Status  New    Target Date  11/22/18      PT LONG TERM GOAL #3   Title  pt will report all daily activities are tolerable without having to be bedbound due to pain    Baseline  pt cannot get up some days    Time  8    Period  Weeks    Status  New    Target Date  11/22/18            Plan - 10/01/18 1206    Clinical Impression Statement  pt had good response from sof ttissue mobs with increased tissue plaibility and decreased tenderness and pain after treatment.  Pt was able to perform exercises correctly but demonstrates fatigue especially with hip adduction.  Pt will benefit from skilled PT to advance LE and core strengh for continuation of healthy pregnancy and improved function during pregnancy    PT Treatment/Interventions  ADLs/Self Care Home Management;Electrical Stimulation;Biofeedback;Cryotherapy;Moist Heat;Therapeutic activities;Therapeutic exercise;Gait training;Functional mobility training;Neuromuscular re-education;Patient/family education;Manual techniques;Passive range of motion;Taping    PT Next Visit Plan  taping, lumbar stretch, pelvic floor and TrA strength    PT Home Exercise Plan   Access Code:  [email or web address]     Recommended Other Services  eval 12/16 cert signed    Consulted and Agree with Plan of Care  Patient       Patient will benefit from skilled therapeutic intervention in order to improve the following deficits and impairments:  Abnormal gait, Pain, Improper body mechanics, Increased fascial restricitons, Decreased range of motion, Decreased strength  Visit Diagnosis: Muscle weakness (generalized)  Difficulty in walking, not elsewhere classified     Problem List Patient Active Problem List   Diagnosis Date Noted  . Sickle cell trait (HCC) 05/01/2018  . BMI 35-39 05/01/2018  . Obesity in pregnancy 05/01/2018  . GBS bacteriuria 04/28/2018  . Supervision of normal pregnancy, antepartum 04/27/2018  . Current smoker 01/11/2014    Jakki Crosser, PT 10/01/2018, 12:09 PM  [facility] [address], [address] [place], [place], [address] Phone: [phone]   Fax:  [phone]  Name: Katelyn Mcfarland MRN: [id number] Date of Birth: 07/30/1991  PHYSICAL THERAPY DISCHARGE SUMMARY  Visits from Start of Care: 2  Current functional level related to goals / functional outcomes: See above   Remaining deficits: See above   Education / Equipment: HEP  Plan: Patient agrees to discharge.  Patient goals were not met. Patient is being discharged due to not returning since the last visit.  ?????    Jakki Crosser, PT 10/14/18 11:30 AM   

## 2018-10-04 ENCOUNTER — Ambulatory Visit (INDEPENDENT_AMBULATORY_CARE_PROVIDER_SITE_OTHER): Payer: Medicaid Other | Admitting: Advanced Practice Midwife

## 2018-10-04 VITALS — BP 120/76 | HR 94 | Wt 203.4 lb

## 2018-10-04 DIAGNOSIS — R8271 Bacteriuria: Secondary | ICD-10-CM

## 2018-10-04 DIAGNOSIS — O26843 Uterine size-date discrepancy, third trimester: Secondary | ICD-10-CM

## 2018-10-04 DIAGNOSIS — M7918 Myalgia, other site: Secondary | ICD-10-CM

## 2018-10-04 DIAGNOSIS — Z3480 Encounter for supervision of other normal pregnancy, unspecified trimester: Secondary | ICD-10-CM

## 2018-10-04 DIAGNOSIS — O9989 Other specified diseases and conditions complicating pregnancy, childbirth and the puerperium: Secondary | ICD-10-CM

## 2018-10-04 DIAGNOSIS — O99891 Other specified diseases and conditions complicating pregnancy: Secondary | ICD-10-CM

## 2018-10-04 DIAGNOSIS — Z3483 Encounter for supervision of other normal pregnancy, third trimester: Secondary | ICD-10-CM

## 2018-10-04 NOTE — Patient Instructions (Signed)

## 2018-10-04 NOTE — Progress Notes (Signed)
Patient reports fetal movement with occasional pressure. 

## 2018-10-04 NOTE — Progress Notes (Signed)
   PRENATAL VISIT NOTE  Subjective:  Katelyn Mcfarland is a 27 y.o. [id number] at 1277w1d being seen today for ongoing prenatal care.  She is currently monitored for the following issues for this low-risk pregnancy and has Current smoker; Supervision of normal pregnancy, antepartum; GBS bacteriuria; Sickle cell trait (HCC); BMI 35-39; and Obesity in pregnancy on their problem list.  Patient reports pelvic pain.  Contractions: Not present. Vag. Bleeding: None.  Movement: Present. Denies leaking of fluid.   The following portions of the patient's history were reviewed and updated as appropriate: allergies, current medications, past family history, past medical history, past social history, past surgical history and problem list. Problem list updated.  Objective:   Vitals:   10/04/18 1603  BP: 120/76  Pulse: 94  Weight: 92.3 kg    Fetal Status: Fetal Heart Rate (bpm): 156   Movement: Present     General:  Alert, oriented and cooperative. Patient is in no acute distress.  Skin: Skin is warm and dry. No rash noted.   Cardiovascular: Normal heart rate noted  Respiratory: Normal respiratory effort, no problems with respiration noted  Abdomen: Soft, gravid, appropriate for gestational age.  Pain/Pressure: Present     Pelvic: Cervical exam deferred        Extremities: Normal range of motion.  Edema: Trace  Mental Status: Normal mood and affect. Normal behavior. Normal judgment and thought content.   Assessment and Plan:  Pregnancy: [id number] at 5877w1d  1. Supervision of other normal pregnancy, antepartum --Anticipatory guidance about next visits/weeks of pregnancy given.   2. GBS bacteriuria --Needs prophylaxis in labor  3. Pain in symphysis pubis during pregnancy --Unchanged from earlier.  Flexeril and physical therapy are helping some.   4. Uterine size date discrepancy pregnancy, third trimester --FH 36 today, was increased previous visit also.  This with pt pelvic pain this pregnancy  warrant US for EFW. - US MFM OB FOLLOW UP; Future  Preterm labor symptoms and general obstetric precautions including but not limited to vaginal bleeding, contractions, leaking of fluid and fetal movement were reviewed in detail with the patient. Please refer to After Visit Summary for other counseling recommendations.  Return in about 1 week (around 10/11/2018).  No future appointments.  Sharen CounterLisa Leftwich-Kirby, CNM

## 2018-10-11 ENCOUNTER — Encounter

## 2018-10-13 NOTE — L&D Delivery Note (Signed)
OB/GYN Faculty Practice Delivery Note  Katelyn Mcfarland is a 27 y.o. [id number] s/p SVD at [redacted]w[redacted]d. She was admitted for elective term induction.   ROM: 1h 1m with clear fluid GBS Status: positive - received two doses prior to delivery Maximum Maternal Temperature: Temp (24hrs), Avg:98.1 F (36.7 C), Min:97.6 F (36.4 C), Max:98.6 F (37 C)  Labor Progress: . Cytotec x 2 . SROM . Progressed to complete quickly  Delivery Date/Time: 11/14/18 at 1200 Delivery: Called to room and patient was complete and pushing. Head delivered LOA. No nuchal cord present, loose cord around body. Shoulder and body delivered in usual fashion. Infant with spontaneous cry, placed on mother's abdomen, dried and stimulated. Cord clamped x 2 after 1-minute delay, and cut by father of baby. Cord blood drawn. Placenta delivered spontaneously with gentle cord traction. Fundus firm with massage and Pitocin. Labia, perineum, vagina, and cervix inspected inspected with right periurethral laceration.   Placenta: spontaneous, intact, 3-vessel cord - to be discarded  Complications: none Lacerations: right periurethral repaired with 4-0 Monocryl EBL: 249cc Analgesia: none  Postpartum Planning [x]  message to sent to schedule follow-up  [x]  vaccines UTD  Infant: vigorous female infant  APGARs 8, 9  weight pending (AGA)  Laurel S. Earlene Plater, DO OB/GYN Fellow, Faculty Practice

## 2018-10-18 ENCOUNTER — Ambulatory Visit (HOSPITAL_COMMUNITY): Payer: Self-pay

## 2018-10-19 ENCOUNTER — Other Ambulatory Visit (HOSPITAL_COMMUNITY)
Admission: RE | Admit: 2018-10-19 | Discharge: 2018-10-19 | Disposition: A | Discharge status: Home / Self Care | Payer: Medicaid Other | Source: Ambulatory Visit | Attending: Advanced Practice Midwife | Admitting: Advanced Practice Midwife

## 2018-10-19 ENCOUNTER — Encounter: Payer: Self-pay | Admitting: Advanced Practice Midwife

## 2018-10-19 ENCOUNTER — Ambulatory Visit (INDEPENDENT_AMBULATORY_CARE_PROVIDER_SITE_OTHER): Payer: Medicaid Other | Admitting: Advanced Practice Midwife

## 2018-10-19 VITALS — BP 123/81 | HR 106 | Wt 208.0 lb

## 2018-10-19 DIAGNOSIS — Z3480 Encounter for supervision of other normal pregnancy, unspecified trimester: Secondary | ICD-10-CM

## 2018-10-19 DIAGNOSIS — R102 Pelvic and perineal pain: Secondary | ICD-10-CM

## 2018-10-19 DIAGNOSIS — O9989 Other specified diseases and conditions complicating pregnancy, childbirth and the puerperium: Secondary | ICD-10-CM

## 2018-10-19 DIAGNOSIS — O99891 Other specified diseases and conditions complicating pregnancy: Secondary | ICD-10-CM

## 2018-10-19 DIAGNOSIS — Z3A36 36 weeks gestation of pregnancy: Secondary | ICD-10-CM

## 2018-10-19 DIAGNOSIS — M549 Dorsalgia, unspecified: Secondary | ICD-10-CM

## 2018-10-19 DIAGNOSIS — O26893 Other specified pregnancy related conditions, third trimester: Secondary | ICD-10-CM

## 2018-10-19 DIAGNOSIS — Z3483 Encounter for supervision of other normal pregnancy, third trimester: Secondary | ICD-10-CM

## 2018-10-19 NOTE — Progress Notes (Signed)
   PRENATAL VISIT NOTE  Subjective:  Katelyn Mcfarland is a 28 y.o. [id number] at [redacted]w[redacted]d being seen today for ongoing prenatal care.  She is currently monitored for the following issues for this low-risk pregnancy and has Current smoker; Supervision of normal pregnancy, antepartum; GBS bacteriuria; Sickle cell trait (HCC); BMI 35-39; and Obesity in pregnancy on their problem list.  Patient reports pelvic pain making walking, getting in and out of bed or the car difficult.  Contractions: Not present. Vag. Bleeding: None.  Movement: Present. Denies leaking of fluid.   The following portions of the patient's history were reviewed and updated as appropriate: allergies, current medications, past family history, past medical history, past social history, past surgical history and problem list. Problem list updated.  Objective:   Vitals:   10/19/18 1040  BP: 123/81  Pulse: (!) 106  Weight: 94.3 kg    Fetal Status: Fetal Heart Rate (bpm): 152 Fundal Height: 37 cm Movement: Present  Presentation: Vertex  General:  Alert, oriented and cooperative. Patient is in no acute distress.  Skin: Skin is warm and dry. No rash noted.   Cardiovascular: Normal heart rate noted  Respiratory: Normal respiratory effort, no problems with respiration noted  Abdomen: Soft, gravid, appropriate for gestational age.  Pain/Pressure: Present     Pelvic: Cervical exam performed Dilation: Closed Effacement (%): 70 Station: Ballotable  Extremities: Normal range of motion.  Edema: Trace  Mental Status: Normal mood and affect. Normal behavior. Normal judgment and thought content.   Assessment and Plan:  Pregnancy: [id number] at [redacted]w[redacted]d  1. Supervision of other normal pregnancy, antepartum --Anticipatory guidance about next visits/weeks of pregnancy given.  2. Back pain affecting pregnancy in third trimester  3. Pelvic pain affecting pregnancy in third trimester, antepartum --Ongoing pelvic pain/pubic symphysis pain with  pregnancy. Tried physical therapy but caused more pain.  Is doing some exercises at home.   Term labor symptoms and general obstetric precautions including but not limited to vaginal bleeding, contractions, leaking of fluid and fetal movement were reviewed in detail with the patient. Please refer to After Visit Summary for other counseling recommendations.  Return in about 1 week (around 10/26/2018).  Future Appointments  Date Time Provider Department Center  10/20/2018  3:15 PM [facility] [name] [facility]  10/27/2018  2:00 PM Calvert Cantor, CNM [facility] None    Sharen Counter, [place]

## 2018-10-19 NOTE — Progress Notes (Signed)
+  GBS in urine   CC: discomfort and hard to walk and do normal activities.

## 2018-10-19 NOTE — Patient Instructions (Signed)

## 2018-10-20 ENCOUNTER — Ambulatory Visit (HOSPITAL_COMMUNITY): Admission: RE | Admit: 2018-10-20 | Payer: Medicaid Other | Source: Ambulatory Visit

## 2018-10-20 LAB — CERVICOVAGINAL ANCILLARY ONLY
Chlamydia: NEGATIVE
Neisseria Gonorrhea: NEGATIVE

## 2018-10-21 ENCOUNTER — Telehealth: Payer: Self-pay | Admitting: *Deleted

## 2018-10-21 NOTE — Telephone Encounter (Signed)
Pt called to office inquiring about labor policies and visitation.  Attempt to return call.  LM on VM to call office.

## 2018-10-27 ENCOUNTER — Ambulatory Visit (HOSPITAL_COMMUNITY)
Admission: RE | Admit: 2018-10-27 | Discharge: 2018-10-27 | Disposition: A | Discharge status: Home / Self Care | Payer: Medicaid Other | Source: Ambulatory Visit | Attending: Advanced Practice Midwife | Admitting: Advanced Practice Midwife

## 2018-10-27 ENCOUNTER — Ambulatory Visit (INDEPENDENT_AMBULATORY_CARE_PROVIDER_SITE_OTHER): Payer: Medicaid Other | Admitting: Advanced Practice Midwife

## 2018-10-27 VITALS — BP 122/79 | HR 100 | Wt 207.4 lb

## 2018-10-27 DIAGNOSIS — O99213 Obesity complicating pregnancy, third trimester: Secondary | ICD-10-CM | POA: Diagnosis not present

## 2018-10-27 DIAGNOSIS — Z3A37 37 weeks gestation of pregnancy: Secondary | ICD-10-CM | POA: Diagnosis not present

## 2018-10-27 DIAGNOSIS — O26843 Uterine size-date discrepancy, third trimester: Secondary | ICD-10-CM | POA: Insufficient documentation

## 2018-10-27 DIAGNOSIS — Z3480 Encounter for supervision of other normal pregnancy, unspecified trimester: Secondary | ICD-10-CM

## 2018-10-27 NOTE — Progress Notes (Signed)
Patient reports fetal movement with pressure and irregular contractions. 

## 2018-10-27 NOTE — Patient Instructions (Signed)

## 2018-10-27 NOTE — Progress Notes (Signed)
   PRENATAL VISIT NOTE  Subjective:  Katelyn Mcfarland is a 28 y.o. D5T4707 at [redacted]w[redacted]d being seen today for ongoing prenatal care.  She is currently monitored for the following issues for this low-risk pregnancy and has Current smoker; Supervision of normal pregnancy, antepartum; GBS bacteriuria; Sickle cell trait (HCC); BMI 35-39; and Obesity in pregnancy on their problem list.  Patient reports backache, no bleeding, no contractions, no cramping, no leaking and occasional contractions.  Contractions: Irregular. Vag. Bleeding: None.  Movement: Present. Denies leaking of fluid.   The following portions of the patient's history were reviewed and updated as appropriate: allergies, current medications, past family history, past medical history, past social history, past surgical history and problem list. Problem list updated.  Objective:   Vitals:   10/27/18 1346  BP: 122/79  Pulse: 100  Weight: 207 lb 6.4 oz (94.1 kg)    Fetal Status: Fetal Heart Rate (bpm): 140 Fundal Height: 38 cm Movement: Present  Presentation: Vertex  General:  Alert, oriented and cooperative. Patient is in no acute distress.  Skin: Skin is warm and dry. No rash noted.   Cardiovascular: Normal heart rate noted  Respiratory: Normal respiratory effort, no problems with respiration noted  Abdomen: Soft, gravid, appropriate for gestational age.  Pain/Pressure: Present     Pelvic: Cervical exam deferred        Extremities: Normal range of motion.  Edema: Trace  Mental Status: Normal mood and affect. Normal behavior. Normal judgment and thought content.   Assessment and Plan:  Pregnancy: A1H1834 at [redacted]w[redacted]d  1. Supervision of other normal pregnancy, antepartum --Recurrent pelvic and lower back pain. Managing with slow position changes. --Discussed IV access and possibility of saline lock vs active IV for labor --GBS prophylaxis in labor for GBS bacteruria  Term labor symptoms and general obstetric precautions including but  not limited to vaginal bleeding, contractions, leaking of fluid and fetal movement were reviewed in detail with the patient. Please refer to After Visit Summary for other counseling recommendations.  Return in about 1 week (around 11/03/2018).  No future appointments.  Calvert Cantor, CNM

## 2018-11-03 ENCOUNTER — Encounter: Payer: Self-pay | Admitting: Certified Nurse Midwife

## 2018-11-03 ENCOUNTER — Ambulatory Visit (INDEPENDENT_AMBULATORY_CARE_PROVIDER_SITE_OTHER): Payer: Medicaid Other | Admitting: Certified Nurse Midwife

## 2018-11-03 VITALS — BP 126/88 | HR 100 | Wt 211.0 lb

## 2018-11-03 DIAGNOSIS — R8271 Bacteriuria: Secondary | ICD-10-CM

## 2018-11-03 DIAGNOSIS — O99213 Obesity complicating pregnancy, third trimester: Secondary | ICD-10-CM

## 2018-11-03 DIAGNOSIS — Z3480 Encounter for supervision of other normal pregnancy, unspecified trimester: Secondary | ICD-10-CM

## 2018-11-03 DIAGNOSIS — Z3483 Encounter for supervision of other normal pregnancy, third trimester: Secondary | ICD-10-CM

## 2018-11-03 DIAGNOSIS — O99210 Obesity complicating pregnancy, unspecified trimester: Secondary | ICD-10-CM

## 2018-11-03 NOTE — Progress Notes (Signed)
   PRENATAL VISIT NOTE  Subjective:  Katelyn Mcfarland is a 28 y.o. G2X5284 at [redacted]w[redacted]d being seen today for ongoing prenatal care.  She is currently monitored for the following issues for this low-risk pregnancy and has Current smoker; Supervision of normal pregnancy, antepartum; GBS bacteriuria; Sickle cell trait (HCC); BMI 35-39; and Obesity in pregnancy on their problem list.  Patient reports no complaints.  Contractions: Not present. Vag. Bleeding: None.  Movement: Present. Denies leaking of fluid.   The following portions of the patient's history were reviewed and updated as appropriate: allergies, current medications, past family history, past medical history, past social history, past surgical history and problem list. Problem list updated.  Objective:   Vitals:   11/03/18 0816  BP: 126/88  Pulse: 100  Weight: 211 lb (95.7 kg)    Fetal Status: Fetal Heart Rate (bpm): 156 Fundal Height: 38 cm Movement: Present  Presentation: Vertex  General:  Alert, oriented and cooperative. Patient is in no acute distress.  Skin: Skin is warm and dry. No rash noted.   Cardiovascular: Normal heart rate noted  Respiratory: Normal respiratory effort, no problems with respiration noted  Abdomen: Soft, gravid, appropriate for gestational age.  Pain/Pressure: Present     Pelvic: Cervical exam deferred        Extremities: Normal range of motion.  Edema: Trace  Mental Status: Normal mood and affect. Normal behavior. Normal judgment and thought content.   Assessment and Plan:  Pregnancy: X3K4401 at [redacted]w[redacted]d  1. Supervision of other normal pregnancy, antepartum - Patient doing well other than being tired - Routine prenatal care  - Anticipatory guidance on upcoming appointments  - Educated and discussed EPO, RRT and IC to induce contractions at home  - Discussed membrane sweep at next prenatal appointment   2. GBS bacteriuria - Needs treatment during labor   3. Obesity in pregnancy - TWG 6lb during  pregnancy  - EFW 2879gm (6lb 6oz) on 1/15 at 37 weeks   Term labor symptoms and general obstetric precautions including but not limited to vaginal bleeding, contractions, leaking of fluid and fetal movement were reviewed in detail with the patient.  Please refer to After Visit Summary for other counseling recommendations.  Return in about 1 week (around 11/10/2018) for ROB.  Future Appointments  Date Time Provider Department Center  11/10/2018  9:45 AM Gerrit Heck, CNM [facility] None    Sharyon Cable, CNM

## 2018-11-03 NOTE — Patient Instructions (Signed)
Reasons to go to MAU:  1.  Contractions are  5 minutes apart or less, each last 1 minute, these have been going on for 1-2 hours, and you cannot walk or talk during them 2.  You have a large gush of fluid, or a trickle of fluid that will not stop and you have to wear a pad 3.  You have bleeding that is bright red, heavier than spotting--like menstrual bleeding (spotting can be normal in early labor or after a check of your cervix) 4.  You do not feel the baby moving like he/she normally does  

## 2018-11-09 ENCOUNTER — Encounter (HOSPITAL_COMMUNITY): Payer: Self-pay | Admitting: *Deleted

## 2018-11-09 ENCOUNTER — Inpatient Hospital Stay (HOSPITAL_COMMUNITY)
Admission: AD | Admit: 2018-11-09 | Discharge: 2018-11-09 | Disposition: A | Discharge status: Home / Self Care | Payer: Medicaid Other | Attending: Obstetrics and Gynecology | Admitting: Obstetrics and Gynecology

## 2018-11-09 ENCOUNTER — Other Ambulatory Visit: Payer: Self-pay

## 2018-11-09 DIAGNOSIS — O479 False labor, unspecified: Secondary | ICD-10-CM

## 2018-11-09 DIAGNOSIS — O471 False labor at or after 37 completed weeks of gestation: Secondary | ICD-10-CM | POA: Diagnosis present

## 2018-11-09 DIAGNOSIS — Z3689 Encounter for other specified antenatal screening: Secondary | ICD-10-CM

## 2018-11-09 DIAGNOSIS — Z3A39 39 weeks gestation of pregnancy: Secondary | ICD-10-CM | POA: Diagnosis not present

## 2018-11-09 NOTE — Discharge Instructions (Signed)

## 2018-11-09 NOTE — MAU Provider Note (Addendum)
None     S: Ms. Katelyn Mcfarland is a 28 y.o. G6Y4034 at [redacted]w[redacted]d  who presents to MAU today complaining contractions q 2-10 minutes since 1pm today. She denies vaginal bleeding. She denies LOF. She reports normal fetal movement.    O: BP 133/71 (BP Location: Right Arm)   Pulse 96   Temp (!) 97.5 F (36.4 C) (Oral)   Resp 20   Ht 5' (1.524 m)   Wt 93.9 kg   LMP 02/07/2018 (LMP Unknown)   BMI 40.43 kg/m  GENERAL: Well-developed, well-nourished female in no acute distress.  HEAD: Normocephalic, atraumatic.  CHEST: Normal effort of breathing, regular heart rate ABDOMEN: Soft, nontender, gravid  Cervical exam:  Dilation: Closed Effacement (%): Thick Station: Ballotable Exam by:: Knox Saliva, CNM   Fetal Monitoring: Baseline: 145 Variability: moderate Accelerations: positive 15 x 15 Decelerations: none Contractions: irregular q 2-10 min, palpate mild   A: SIUP at [redacted]w[redacted]d  False labor Reactive tracing  Normotensive Explained to patient that I am unable to sweep her membranes due to closed cervix  P: --Discharge home in stable condition with labor precautions  F/U: Patient has appointment at [facility]  Calvert Cantor, [place] 11/09/2018 6:16 PM;

## 2018-11-09 NOTE — MAU Note (Signed)
Pt C/O contractions since 1240, becoming stronger & more regular, lots of pelvic pressure.  Denies bleeding or LOF.  Reports good fetal movement.

## 2018-11-09 NOTE — MAU Note (Signed)
I have communicated with Knox Saliva, CNM and reviewed vital signs:  Vitals:   11/09/18 1704 11/09/18 1757  BP: 133/71 138/80  Pulse: 96 (!) 105  Resp: 20 18  Temp: (!) 97.5 F (36.4 C) 98 F (36.7 C)  SpO2:  98%    Vaginal exam:  Dilation: Closed Effacement (%): Thick Station: Ballotable Exam by:: Knox Saliva, CNM,   Also reviewed contraction pattern and that non-stress test is reactive.  It has been documented that patient is contracting irregularly with a closed cervix  not indicating active labor.  Patient denies any other complaints.  Based on this report provider has given order for discharge.  A discharge order and diagnosis entered by a provider.   Labor discharge instructions reviewed with patient.

## 2018-11-10 ENCOUNTER — Ambulatory Visit (INDEPENDENT_AMBULATORY_CARE_PROVIDER_SITE_OTHER): Payer: Medicaid Other

## 2018-11-10 VITALS — BP 123/77 | HR 90 | Wt 212.0 lb

## 2018-11-10 DIAGNOSIS — Z3480 Encounter for supervision of other normal pregnancy, unspecified trimester: Secondary | ICD-10-CM

## 2018-11-10 DIAGNOSIS — Z3483 Encounter for supervision of other normal pregnancy, third trimester: Secondary | ICD-10-CM

## 2018-11-10 NOTE — Progress Notes (Signed)
   PRENATAL VISIT NOTE  Subjective:  Katelyn Mcfarland is a 28 y.o. [id number] at [redacted]w[redacted]d who presents today for routine prenatal care.  She is currently being monitored for supervision of a low-risk pregnancy with problems as listed below.  Patient has no pregnancy related concerns and endorses fetal movement.  She denies vaginal concerns including discharge, bleeding, leaking, itching, and burning. She reports having intermittent contractions, but none today. Patient expresses fatigue s/t desire with being done with pregnancy and requests stripping of membranes and scheduling of IOL at earliest interval.    Patient Active Problem List   Diagnosis Date Noted  . Sickle cell trait (HCC) 05/01/2018  . BMI 35-39 05/01/2018  . Obesity in pregnancy 05/01/2018  . GBS bacteriuria 04/28/2018  . Supervision of normal pregnancy, antepartum 04/27/2018  . Current smoker 01/11/2014    The following portions of the patient's history were reviewed and updated as appropriate: allergies, current medications, past family history, past medical history, past social history, past surgical history and problem list. Problem list updated.  Objective:   Vitals:   11/10/18 1012  BP: 123/77  Pulse: 90  Weight: 212 lb (96.2 kg)    Fetal Status: Fetal Heart Rate (bpm): 150 Fundal Height: 39 cm Movement: Present     General:  Alert, oriented and cooperative. Patient is in no acute distress.  Skin: Skin is warm and dry.   Cardiovascular: Regular rate and rhythm.  Respiratory: Normal respiratory effort. CTA-Bilaterally  Abdomen: Soft, gravid, appropriate for gestational age.  Pelvic: Cervical exam performed Dilation: Closed      Extremities: Normal range of motion.  Edema: Trace  Mental Status: Normal mood and affect. Normal behavior. Normal judgment and thought content.   Assessment and Plan:  Pregnancy: J4N8295 at [redacted]w[redacted]d  1. Supervision of other normal pregnancy, antepartum -Discussed inability to strip  membranes due to closed cervix -Suggestions given for inducing labor at home including IC and Red Raspberry Tea -Will schedule for IOL at midnight slot at 40 weeks and inform patient accordingly *Discussed r/b of induction including fetal distress, serial induction, pain, and increased risk of c/s delivery *Discussed induction methods including cervical ripening agents, foley bulbs, and pitocin *Patient verbalizes understanding and has no q/c -Plan to RTO in one week for [facility]   Term labor symptoms and general obstetric precautions including but not limited to vaginal bleeding, contractions, leaking of fluid and fetal movement were reviewed with the patient.  Please refer to After Visit Summary for other counseling recommendations.  Return in about 1 week (around 11/17/2018) for ROB.  Future Appointments  Date Time Provider Department Center  11/17/2018  8:15 AM Sharyon Cable, CNM [facility] None    Cherre Robins, CNM 11/10/2018, 12:47 PM

## 2018-11-10 NOTE — Progress Notes (Signed)
ROB.  C/o shortness of breath and fatigue x 4 days.

## 2018-11-11 ENCOUNTER — Telehealth (HOSPITAL_COMMUNITY): Payer: Self-pay | Admitting: *Deleted

## 2018-11-11 NOTE — Telephone Encounter (Signed)
Preadmission screen  

## 2018-11-12 NOTE — Addendum Note (Signed)
Addended by: Gerrit Heck L on: 11/12/2018 10:03 AM   Modules accepted: Orders, SmartSet

## 2018-11-14 ENCOUNTER — Inpatient Hospital Stay (HOSPITAL_COMMUNITY)
Admission: RE | Admit: 2018-11-14 | Discharge: 2018-11-16 | DRG: 798 | Disposition: A | Discharge status: Home / Self Care | Payer: Medicaid Other | Attending: Obstetrics & Gynecology | Admitting: Obstetrics & Gynecology

## 2018-11-14 ENCOUNTER — Other Ambulatory Visit: Payer: Self-pay

## 2018-11-14 ENCOUNTER — Encounter (HOSPITAL_COMMUNITY): Payer: Self-pay

## 2018-11-14 ENCOUNTER — Inpatient Hospital Stay (HOSPITAL_COMMUNITY): Payer: Medicaid Other | Admitting: Anesthesiology

## 2018-11-14 DIAGNOSIS — D573 Sickle-cell trait: Secondary | ICD-10-CM | POA: Diagnosis present

## 2018-11-14 DIAGNOSIS — K5900 Constipation, unspecified: Secondary | ICD-10-CM | POA: Diagnosis not present

## 2018-11-14 DIAGNOSIS — O99824 Streptococcus B carrier state complicating childbirth: Secondary | ICD-10-CM | POA: Diagnosis present

## 2018-11-14 DIAGNOSIS — Z87891 Personal history of nicotine dependence: Secondary | ICD-10-CM | POA: Diagnosis not present

## 2018-11-14 DIAGNOSIS — Z3490 Encounter for supervision of normal pregnancy, unspecified, unspecified trimester: Secondary | ICD-10-CM | POA: Diagnosis present

## 2018-11-14 DIAGNOSIS — O9902 Anemia complicating childbirth: Secondary | ICD-10-CM | POA: Diagnosis present

## 2018-11-14 DIAGNOSIS — O99214 Obesity complicating childbirth: Secondary | ICD-10-CM | POA: Diagnosis present

## 2018-11-14 DIAGNOSIS — Z3A40 40 weeks gestation of pregnancy: Secondary | ICD-10-CM | POA: Diagnosis not present

## 2018-11-14 DIAGNOSIS — Z302 Encounter for sterilization: Secondary | ICD-10-CM

## 2018-11-14 DIAGNOSIS — R8271 Bacteriuria: Secondary | ICD-10-CM | POA: Diagnosis present

## 2018-11-14 DIAGNOSIS — Z3483 Encounter for supervision of other normal pregnancy, third trimester: Secondary | ICD-10-CM | POA: Diagnosis present

## 2018-11-14 DIAGNOSIS — O99210 Obesity complicating pregnancy, unspecified trimester: Secondary | ICD-10-CM | POA: Diagnosis present

## 2018-11-14 DIAGNOSIS — O480 Post-term pregnancy: Secondary | ICD-10-CM | POA: Diagnosis not present

## 2018-11-14 DIAGNOSIS — F17200 Nicotine dependence, unspecified, uncomplicated: Secondary | ICD-10-CM | POA: Diagnosis present

## 2018-11-14 LAB — CBC
HCT: 34.7 % — ABNORMAL LOW (ref 36.0–46.0)
Hemoglobin: 11.6 g/dL — ABNORMAL LOW (ref 12.0–15.0)
MCH: 29.1 pg (ref 26.0–34.0)
MCHC: 33.4 g/dL (ref 30.0–36.0)
MCV: 87 fL (ref 80.0–100.0)
Platelets: 169 10*3/uL (ref 150–400)
RBC: 3.99 MIL/uL (ref 3.87–5.11)
RDW: 14.6 % (ref 11.5–15.5)
WBC: 9.7 10*3/uL (ref 4.0–10.5)
nRBC: 0 % (ref 0.0–0.2)

## 2018-11-14 LAB — RPR: RPR Ser Ql: NONREACTIVE

## 2018-11-14 MED ORDER — SIMETHICONE 80 MG PO CHEW: 80.0000 mg | CHEWABLE_TABLET | ORAL | Status: DC | PRN

## 2018-11-14 MED ORDER — LACTATED RINGERS IV SOLN: 500.0000 mL | Freq: Once | INTRAVENOUS | Status: DC

## 2018-11-14 MED ORDER — ONDANSETRON HCL 4 MG/2ML IJ SOLN
4.0000 mg | INTRAMUSCULAR | Status: DC | PRN
  Administered 2018-11-15: 4 mg via INTRAVENOUS

## 2018-11-14 MED ORDER — PRENATAL MULTIVITAMIN CH: 1.0000 | ORAL_TABLET | Freq: Every day | ORAL | Status: DC

## 2018-11-14 MED ORDER — FENTANYL 2.5 MCG/ML BUPIVACAINE 1/10 % EPIDURAL INFUSION (WH - ANES)
INTRAMUSCULAR | Status: AC
  Filled 2018-11-14: qty 100

## 2018-11-14 MED ORDER — DIPHENHYDRAMINE HCL 25 MG PO CAPS: 25.0000 mg | ORAL_CAPSULE | Freq: Four times a day (QID) | ORAL | Status: DC | PRN

## 2018-11-14 MED ORDER — MEASLES, MUMPS & RUBELLA VAC IJ SOLR
0.5000 mL | Freq: Once | INTRAMUSCULAR | Status: AC
  Filled 2018-11-14: qty 0.5

## 2018-11-14 MED ORDER — PHENYLEPHRINE 40 MCG/ML (10ML) SYRINGE FOR IV PUSH (FOR BLOOD PRESSURE SUPPORT)
80.0000 ug | PREFILLED_SYRINGE | INTRAVENOUS | Status: DC | PRN
  Filled 2018-11-14: qty 10

## 2018-11-14 MED ORDER — VITAMIN K1 1 MG/0.5ML IJ SOLN
INTRAMUSCULAR | Status: AC
  Filled 2018-11-14: qty 0.5

## 2018-11-14 MED ORDER — PENICILLIN G 3 MILLION UNITS IVPB - SIMPLE MED: 3.0000 10*6.[IU] | INTRAVENOUS | Status: DC

## 2018-11-14 MED ORDER — EPHEDRINE 5 MG/ML INJ
10.0000 mg | INTRAVENOUS | Status: DC | PRN
  Filled 2018-11-14: qty 2

## 2018-11-14 MED ORDER — OXYCODONE HCL 5 MG PO TABS
5.0000 mg | ORAL_TABLET | ORAL | Status: DC | PRN
  Administered 2018-11-14 – 2018-11-16 (×3): 5 mg via ORAL
  Filled 2018-11-14 (×3): qty 1

## 2018-11-14 MED ORDER — FENTANYL 2.5 MCG/ML BUPIVACAINE 1/10 % EPIDURAL INFUSION (WH - ANES): 14.0000 mL/h | INTRAMUSCULAR | Status: DC | PRN

## 2018-11-14 MED ORDER — SENNOSIDES-DOCUSATE SODIUM 8.6-50 MG PO TABS
2.0000 | ORAL_TABLET | ORAL | Status: DC
  Administered 2018-11-14 – 2018-11-15 (×2): 2 via ORAL
  Filled 2018-11-14 (×2): qty 2

## 2018-11-14 MED ORDER — SOD CITRATE-CITRIC ACID 500-334 MG/5ML PO SOLN: 30.0000 mL | ORAL | Status: DC | PRN

## 2018-11-14 MED ORDER — ONDANSETRON HCL 4 MG/2ML IJ SOLN
4.0000 mg | Freq: Four times a day (QID) | INTRAMUSCULAR | Status: DC | PRN
  Administered 2018-11-14: 4 mg via INTRAVENOUS
  Filled 2018-11-14: qty 2

## 2018-11-14 MED ORDER — PENICILLIN G POTASSIUM 5000000 UNITS IJ SOLR: 5.0000 10*6.[IU] | Freq: Once | INTRAMUSCULAR | Status: DC

## 2018-11-14 MED ORDER — TERBUTALINE SULFATE 1 MG/ML IJ SOLN
0.2500 mg | Freq: Once | INTRAMUSCULAR | Status: DC | PRN
  Filled 2018-11-14: qty 1

## 2018-11-14 MED ORDER — ACETAMINOPHEN 325 MG PO TABS
650.0000 mg | ORAL_TABLET | ORAL | Status: DC | PRN
  Administered 2018-11-15 – 2018-11-16 (×3): 650 mg via ORAL
  Filled 2018-11-14 (×3): qty 2

## 2018-11-14 MED ORDER — LACTATED RINGERS IV SOLN
INTRAVENOUS | Status: DC
  Administered 2018-11-14 (×2): via INTRAVENOUS

## 2018-11-14 MED ORDER — PENICILLIN G 3 MILLION UNITS IVPB - SIMPLE MED
3.0000 10*6.[IU] | INTRAVENOUS | Status: DC
  Administered 2018-11-14: 3 10*6.[IU] via INTRAVENOUS
  Filled 2018-11-14 (×6): qty 100

## 2018-11-14 MED ORDER — OXYTOCIN 40 UNITS IN NORMAL SALINE INFUSION - SIMPLE MED: 1.0000 m[IU]/min | INTRAVENOUS | Status: DC

## 2018-11-14 MED ORDER — ACETAMINOPHEN 325 MG PO TABS: 650.0000 mg | ORAL_TABLET | ORAL | Status: DC | PRN

## 2018-11-14 MED ORDER — OXYTOCIN BOLUS FROM INFUSION
500.0000 mL | Freq: Once | INTRAVENOUS | Status: AC
  Administered 2018-11-14: 500 mL via INTRAVENOUS

## 2018-11-14 MED ORDER — DIPHENHYDRAMINE HCL 50 MG/ML IJ SOLN: 12.5000 mg | INTRAMUSCULAR | Status: DC | PRN

## 2018-11-14 MED ORDER — OXYTOCIN 40 UNITS IN NORMAL SALINE INFUSION - SIMPLE MED
2.5000 [IU]/h | INTRAVENOUS | Status: DC
  Administered 2018-11-14: 2.5 [IU]/h via INTRAVENOUS
  Filled 2018-11-14: qty 1000

## 2018-11-14 MED ORDER — SODIUM CHLORIDE 0.9 % IV SOLN
5.0000 10*6.[IU] | Freq: Once | INTRAVENOUS | Status: AC
  Administered 2018-11-14: 5 10*6.[IU] via INTRAVENOUS
  Filled 2018-11-14: qty 5

## 2018-11-14 MED ORDER — PHENYLEPHRINE 40 MCG/ML (10ML) SYRINGE FOR IV PUSH (FOR BLOOD PRESSURE SUPPORT)
PREFILLED_SYRINGE | INTRAVENOUS | Status: AC
  Filled 2018-11-14: qty 10

## 2018-11-14 MED ORDER — TETANUS-DIPHTH-ACELL PERTUSSIS 5-2.5-18.5 LF-MCG/0.5 IM SUSP: 0.5000 mL | Freq: Once | INTRAMUSCULAR | Status: AC

## 2018-11-14 MED ORDER — MISOPROSTOL 25 MCG QUARTER TABLET
25.0000 ug | ORAL_TABLET | ORAL | Status: DC | PRN
  Administered 2018-11-14 (×2): 25 ug via VAGINAL
  Filled 2018-11-14 (×2): qty 1

## 2018-11-14 MED ORDER — WITCH HAZEL-GLYCERIN EX PADS
1.0000 "application " | MEDICATED_PAD | CUTANEOUS | Status: DC | PRN
  Administered 2018-11-14: 1 via TOPICAL

## 2018-11-14 MED ORDER — BENZOCAINE-MENTHOL 20-0.5 % EX AERO: 1.0000 "application " | INHALATION_SPRAY | CUTANEOUS | Status: DC | PRN

## 2018-11-14 MED ORDER — DIBUCAINE 1 % RE OINT
1.0000 "application " | TOPICAL_OINTMENT | RECTAL | Status: DC | PRN
  Administered 2018-11-14: 1 via RECTAL
  Filled 2018-11-14: qty 28

## 2018-11-14 MED ORDER — COCONUT OIL OIL
1.0000 "application " | TOPICAL_OIL | Status: DC | PRN
  Administered 2018-11-15: 1 via TOPICAL
  Filled 2018-11-14: qty 120

## 2018-11-14 MED ORDER — LIDOCAINE HCL (PF) 1 % IJ SOLN
30.0000 mL | INTRAMUSCULAR | Status: AC | PRN
  Administered 2018-11-14: 30 mL via SUBCUTANEOUS
  Filled 2018-11-14: qty 30

## 2018-11-14 MED ORDER — LACTATED RINGERS IV SOLN
500.0000 mL | INTRAVENOUS | Status: DC | PRN
  Administered 2018-11-14: 500 mL via INTRAVENOUS

## 2018-11-14 MED ORDER — IBUPROFEN 600 MG PO TABS
600.0000 mg | ORAL_TABLET | Freq: Four times a day (QID) | ORAL | Status: DC
  Administered 2018-11-14 – 2018-11-16 (×5): 600 mg via ORAL
  Filled 2018-11-14 (×5): qty 1

## 2018-11-14 MED ORDER — ONDANSETRON HCL 4 MG PO TABS: 4.0000 mg | ORAL_TABLET | ORAL | Status: DC | PRN

## 2018-11-14 MED ORDER — FENTANYL CITRATE (PF) 100 MCG/2ML IJ SOLN
50.0000 ug | INTRAMUSCULAR | Status: DC | PRN
  Administered 2018-11-14: 100 ug via INTRAVENOUS
  Filled 2018-11-14: qty 2

## 2018-11-14 NOTE — Discharge Summary (Addendum)
Obstetrics Discharge Summary OB/GYN Faculty Practice   Patient Name: Katelyn Mcfarland DOB: May 20, 1991 MRN: [id number]  Date of admission: 11/14/2018 Delivering MD: Tamera Stands   Date of discharge: 11/16/2018  Admitting diagnosis: 40 wk induction Intrauterine pregnancy: 110w0d     Secondary diagnosis:   Principal Problem:   Encounter for planned induction of labor Active Problems:   Current smoker   GBS bacteriuria   Sickle cell trait (HCC)   Obesity in pregnancy   Indication for care in labor or delivery    Discharge diagnosis: Term Pregnancy Delivered                                            Postpartum procedures: None   Outpatient Follow-Up: [ ]  4 weeks  Hospital course: Katelyn Mcfarland is a 27 y.o. [redacted]w[redacted]d who was admitted for elective term induction. Her pregnancy was complicated by sickle cell trait, history of tobacco use, obesity with BMI 42. Her labor course was notable for induction with cytotec x 2, SROM and progression to complete. Delivery was complicated by right periurethral laceration. Please see delivery/op note for additional details. Her postpartum course was uncomplicated. On PPD#1, she received a postpartum BTL. She was breastfeeding without difficulty. By day of discharge, she was passing flatus, urinating, eating and drinking without difficulty. Her pain was well-controlled, and she was discharged home with oxycodone 5mg , ibuprofen and colace for constipation. She will follow-up in clinic in 4 weeks.   Physical exam  Vitals:   11/15/18 2100 11/15/18 2205 11/16/18 0202 11/16/18 0600  BP: (!) 152/89 (!) 154/91 131/85 (!) 138/95  Pulse: 92 91 78 86  Resp: 18 18 18 18   Temp: 97.9 F (36.6 C) 97.9 F (36.6 C) 98.4 F (36.9 C) 98.2 F (36.8 C)  TempSrc: Oral Oral Oral Oral  SpO2: 95% 96% 95% 100%  Weight:      Height:       General: resting in bed, in no apparent distress Lochia: appropriate Uterine Fundus: firm Incision: Healing well with no  significant drainage, Dressing is clean, dry, and intact DVT Evaluation: No evidence of DVT seen on physical exam. No significant calf/ankle edema. Labs: Lab Results  Component Value Date   WBC 9.7 11/14/2018   HGB 11.6 (L) 11/14/2018   HCT 34.7 (L) 11/14/2018   MCV 87.0 11/14/2018   PLT 169 11/14/2018   CMP Latest Ref Rng & Units 07/11/2018  Glucose 70 - 99 mg/dL 568(L)  BUN 6 - 20 mg/dL 6  Creatinine 2.75 - 1.70 mg/dL 0.17  Sodium 494 - 496 mmol/L 136  Potassium 3.5 - 5.1 mmol/L 3.1(L)  Chloride 98 - 111 mmol/L 107  CO2 22 - 32 mmol/L 24  Calcium 8.9 - 10.3 mg/dL 7.5(F)  Total Protein 6.5 - 8.1 g/dL 6.3(L)  Total Bilirubin 0.3 - 1.2 mg/dL 0.8  Alkaline Phos 38 - 126 U/L 53  AST 15 - 41 U/L 18  ALT 0 - 44 U/L 10    Discharge instructions: Per After Visit Summary and "Baby and Me Booklet"  After visit meds:  Allergies as of 11/16/2018   No Known Allergies     Medication List    STOP taking these medications   VITAFOL ULTRA 29-0.6-0.4-200 MG Caps     TAKE these medications   ibuprofen 600 MG tablet Commonly known as:  ADVIL,MOTRIN Take 1 tablet (  600 mg total) by mouth every 6 (six) hours.   oxyCODONE 5 MG immediate release tablet Commonly known as:  Oxy IR/ROXICODONE Take 1 tablet (5 mg total) by mouth every 6 (six) hours as needed for up to 3 days for moderate pain or severe pain.   senna-docusate 8.6-50 MG tablet Commonly known as:  Senokot-S Take 2 tablets by mouth daily. Start taking on:  November 17, 2018       Postpartum contraception: Tubal Ligation - performed 11/15/2018 Diet: Routine Diet Activity: Advance as tolerated. Pelvic rest for 6 weeks.   Follow-up Appt: Future Appointments  Date Time Provider Department Center  12/13/2018  1:00 PM Constant, Gigi GinPeggy, MD [facility] None   Follow-up Visit:No follow-ups on file.  Please schedule this patient for Postpartum visit in: 4 weeks with the following provider: Any provider Low risk pregnancy complicated  by: none Delivery mode:  SVD Anticipated Birth Control:  BTL to be done while admitted PP Procedures needed: none  Schedule Integrated BH visit: no  Newborn Data: Live born female  Birth Weight:   APGAR: 8, 9  Newborn Delivery   Birth date/time:  11/14/2018 12:00:00 Delivery type:  Vaginal, Spontaneous     Baby Feeding: Breast Disposition:home with mother  Peggyann ShoalsHannah Anderson, DO [facility], PGY-1 11/16/2018 8:55 AM  OB FELLOW DISCHARGE ATTESTATION  I have seen and examined this patient and agree with above documentation in the resident's note.   Gwenevere AbbotNimeka Jivan Symanski, MD  OB Fellow  11/16/2018, 10:11 AM

## 2018-11-14 NOTE — Anesthesia Pain Management Evaluation Note (Signed)
  CRNA Pain Management Visit Note  Patient: Katelyn Mcfarland, 28 y.o., female  "Hello I am a member of the anesthesia team at [facility]. We have an anesthesia team available at all times to provide care throughout the hospital, including epidural management and anesthesia for C-section. I don't know your plan for the delivery whether it a natural birth, water birth, IV sedation, nitrous supplementation, doula or epidural, but we want to meet your pain goals."   1.Was your pain managed to your expectations on prior hospitalizations?   Yes   2.What is your expectation for pain management during this hospitalization?     Labor support without medications and IV pain meds  3.How can we help you reach that goal? natural  Record the patient's initial score and the patient's pain goal.   Pain: 6  Pain Goal: 10 [facility] wants you to be able to say your pain was always managed very well.  Rica Records 11/14/2018

## 2018-11-14 NOTE — Progress Notes (Signed)
OB/GYN Faculty Practice: Labor Progress Note  Subjective: Much more uncomfortable. Called to room because feeling pressure.  Objective: BP 137/86   Pulse (!) 105   Temp 98.6 F (37 C) (Oral)   Resp 20   Ht 4\' 11"  (1.499 m)   Wt 95.7 kg   LMP 02/07/2018 (LMP Unknown)   SpO2 98%   BMI 42.62 kg/m  Gen: uncomfortable, moving around during contractions  Dilation: 3 Effacement (%): 90 Cervical Position: Posterior Station: -1 Presentation: Vertex Exam by:: Dr Marlis Edelson  Assessment and Plan: 28 y.o. [id number] [redacted]w[redacted]d here for elective term induction.  Labor: Progressing with cytotec, contracting every 2 minutes, cervix effaced to 90%. -- pain control: getting epidural - planning PP BTL  -- PPH Risk: medium  Fetal Well-Being: EFW 2870g (45%) at 37w3. Cephalic by prior RN check.  -- Category I - continuous fetal monitoring  -- GBS positive - PCN - received 2 doses (last at 0819)  Cristal Deer. Earlene Plater, DO OB/GYN Fellow, Faculty Practice  11:48 AM

## 2018-11-14 NOTE — Progress Notes (Signed)
OB/GYN Faculty Practice: Labor Progress Note  Subjective: On birthing ball with partner, breathing through contractions. States got more intense after 2nd dose of cytotec.  Objective: BP 128/77   Pulse 96   Temp 98.6 F (37 C) (Oral)   Resp 16   Ht 4\' 11"  (1.499 m)   Wt 95.7 kg   LMP 02/07/2018 (LMP Unknown)   SpO2 98%   BMI 42.62 kg/m  Gen: somewhat uncomfortable appearing Dilation: Closed Effacement (%): Thick Cervical Position: Posterior Station: -3 Presentation: Vertex Exam by:: Delrae Sawyers RN  Assessment and Plan: 28 y.o. [id number] [redacted]w[redacted]d here for elective term induction.  Labor: Induction started with cytotec x 2. Next dose due at 12. Will recheck and place FB if still needed.  -- pain control: would like to be as intervention free as possible -- PPH Risk: medium  Fetal Well-Being: EFW 2870g (45%) at 37w3. Cephalic by prior RN check.  -- Category I - continuous fetal monitoring  -- GBS positive - PCN - received 2 doses (last at 0819)  Cristal Deer. Earlene Plater, DO OB/GYN Fellow, Faculty Practice  10:55 AM

## 2018-11-14 NOTE — Lactation Note (Signed)
This note was copied from a baby's chart. Lactation Consultation Note  Patient Name: Katelyn Mcfarland ZPHXT'[id number] Date: 11/14/2018 Reason for consult: Initial assessment;Term  53 hours old FT female who is being exclusively BF by her mother, she's a P3 but not very experienced BF. She was able to BF her first child for 6-8 weeks and her second one for 2 weeks; mom also plans to BF this baby but she didn't mention for how long. She participated in the [facility] program [facility] the [facility], she's already familiar with hand expression and able to see drops of colostrum when [facility] reviewed hand expression with mom prior attempting to latch baby.  Offered assistance with latch and mom agreed to wake baby up to feed. LC checked baby's diaper prior latching and noticed that she had a stool, changed her and documented in Flowsheets. Then LC took baby STS to mom's left breast and attempted to latch but baby was very sleepy, will not open her mouth.   Mom has very large breasts with large nipples, breast are also heavy and slipped out of baby's mouth as soon as she got latched on in cross cradle position. An attempt was documented. Asked mom to call for assistance when needed. Discussed normal newborn behavior and feeding cues.  Feeding plan:  1. Encouraged mom to feed baby STS 8-12 times/24 hours or sooner if feeding cues are present 2. Hand expression and spoon feeding was also encouraged  BF brochure, BF resources and feeding diary were reviewed. Mom reported all questions and concerns were answered, she's aware of LC services and will call PRN.    Maternal Data Formula Feeding for Exclusion: No Has patient been taught Hand Expression?: Yes Does the patient have breastfeeding experience prior to this delivery?: Yes  Feeding   Interventions Interventions: Breast feeding basics reviewed;Assisted with latch;Skin to skin;Breast massage;Breast compression;Adjust position;Support pillows;Hand express  Lactation Tools  Discussed/Used WIC Program: Yes   Consult Status Consult Status: Follow-up Date: 11/15/18 Follow-up type: In-patient    Katelyn Mcfarland 11/14/2018, 9:24 PM

## 2018-11-14 NOTE — H&P (Signed)
LABOR AND DELIVERY ADMISSION HISTORY AND PHYSICAL NOTE  MABELINE HINGSON is a 28 y.o. female [phone] with IUP at [redacted]w[redacted]d by LMP=12w [name] presenting for IOL at term.  No contractions. She reports positive fetal movement. She denies leakage of fluid or vaginal bleeding.  Prenatal History/Complications: PNC at [facility] Pregnancy complications:  - GBS bacteriuria - Tobacco use in pregnancy - Obesity  Past Medical History: Past Medical History:  Diagnosis Date  . Allergy   . Chlamydia    age 70yo; hx/o trichomonas age 16yo  . Chronic headache   . Depression    doing ok, declined meds  . High cholesterol   . Infection    UTI  . Vaginal Pap smear, abnormal    bx, cryo    Past Surgical History: Past Surgical History:  Procedure Laterality Date  . INDUCED ABORTION    . WISDOM TOOTH EXTRACTION      Obstetrical History: OB History    Gravida  6   Para  2   Term  2   Preterm  0   AB  3   Living  2     SAB  2   TAB  1   Ectopic  0   Multiple  0   Live Births  2           Social History: Social History   Socioeconomic History  . Marital status: Single    Spouse name: Not on file  . Number of children: Not on file  . Years of education: Not on file  . Highest education level: Not on file  Occupational History  . Occupation: student [employer]     Employer: [employer]  Social Needs  . Financial resource strain: Not on file  . Food insecurity:    Worry: Not on file    Inability: Not on file  . Transportation needs:    Medical: Not on file    Non-medical: Not on file  Tobacco Use  . Smoking status: Former Smoker    Packs/day: 0.50    Years: 16.00    Pack years: 8.00    Types: Cigarettes    Last attempt to quit: 2019    Years since quitting: 1.0  . Smokeless tobacco: Never Used  . Tobacco comment: 2every other day  Substance and Sexual Activity  . Alcohol use: Not Currently    Comment: not currently  . Drug use: No    Types: Marijuana    Comment:  last Mar 2018  . Sexual activity: Not Currently  Lifestyle  . Physical activity:    Days per week: Not on file    Minutes per session: Not on file  . Stress: Not on file  Relationships  . Social connections:    Talks on phone: Not on file    Gets together: Not on file    Attends religious service: Not on file    Active member of club or organization: Not on file    Attends meetings of clubs or organizations: Not on file    Relationship status: Not on file  Other Topics Concern  . Not on file  Social History Narrative   Single, 2 children, exercise - none.  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 Hypertension Mother   . Diabetes Mother   . Asthma Sister   . Cancer Paternal Grandmother   . Cancer  Paternal Grandfather   . Cancer Maternal Grandmother   . Kidney disease Maternal Grandmother   . Diabetes Maternal Grandmother   . Heart disease Neg Hx   . Stroke Neg Hx     Allergies: No Known Allergies  Medications Prior to Admission  Medication Sig Dispense Refill Last Dose  . acetaminophen (TYLENOL) 500 MG tablet Take 500 mg by mouth every 6 (six) hours as needed for mild pain.   Not Taking  . cephALEXin (KEFLEX) 500 MG capsule Take 1 capsule (500 mg total) by mouth 4 (four) times daily. 28 capsule 0 Taking  . cyclobenzaprine (FLEXERIL) 10 MG tablet Take 1 tablet (10 mg total) by mouth 2 (two) times daily as needed for muscle spasms. (Patient not taking: Reported on 11/10/2018) 20 tablet 0 Not Taking  . Elastic Bandages & Supports (COMFORT FIT MATERNITY SUPP SM) MISC Wear as directed. (Patient not taking: Reported on 11/10/2018) 1 each 0 Not Taking  . famotidine-calcium carbonate-magnesium hydroxide (PEPCID COMPLETE) 10-800-165 MG chewable tablet Chew 1 tablet by mouth 2 (two) times daily as needed. (Patient not taking: Reported on 11/10/2018) 60 tablet 11 Not Taking  . metoCLOPramide  (REGLAN) 10 MG tablet Take 1 tablet (10 mg total) by mouth 3 (three) times daily before meals. (Patient not taking: Reported on 11/10/2018) 90 tablet 2 Not Taking  . NIFEdipine (PROCARDIA) 10 MG capsule Take 1 capsule (10 mg total) by mouth every 6 (six) hours as needed (contractions). (Patient not taking: Reported on 11/10/2018) 30 capsule 0 Not Taking  . ondansetron (ZOFRAN-ODT) 4 MG disintegrating tablet Take 1 tablet (4 mg total) by mouth every 8 (eight) hours. Take q8h x 14 days and then do q8h prn after that (Patient not taking: Reported on 09/06/2018) 40 tablet 0 Not Taking  . Prenat-Fe Poly-Methfol-FA-DHA (VITAFOL ULTRA) 29-0.6-0.4-200 MG CAPS Take 1 tablet by mouth daily before breakfast. 90 capsule 4 Taking  . pyridOXINE (VITAMIN B-6) 25 MG tablet Take 1 tablet (25 mg total) by mouth 2 (two) times daily. (Patient not taking: Reported on 10/04/2018) 60 tablet 1 Not Taking  . ranitidine (ZANTAC) 150 MG tablet Take 1 tablet (150 mg total) by mouth 2 (two) times daily. (Patient not taking: Reported on 09/20/2018) 60 tablet 5 Not Taking  . terconazole (TERAZOL 7) 0.4 % vaginal cream Place 1 applicator vaginally at bedtime. 45 g 0 Taking     Review of Systems  All systems reviewed and negative except as stated in HPI  Physical Exam Last menstrual period 02/07/2018, unknown if currently breastfeeding. General appearance: alert, oriented, NAD Lungs: normal respiratory effort Heart: regular rate Abdomen: soft, non-tender; gravid, FH appropriate for GA Extremities: No calf swelling or tenderness Presentation: cephalic Fetal monitoring: 120s/mod var/+ accels/variable decels Uterine activity: irritability    Prenatal labs: ABO, Rh: O/Positive/-- (07/16 1114) Antibody: Positive, See Final Results (07/16 1114) Rubella: 9.06 (07/16 1114) RPR: Non Reactive (11/11 1115)  HBsAg: Negative (07/16 1114)  HIV: Non Reactive (11/11 1115)  GC/Chlamydia: Negative GBS:   Positive in urine 2-hr GTT:  72/145/131 Genetic screening:  NIPS low risk Anatomy US: normal  Prenatal Transfer Tool  Maternal Diabetes: No Genetic Screening: Normal Maternal Ultrasounds/Referrals: Normal Fetal Ultrasounds or other Referrals:  None Maternal Substance Abuse:  Yes:  Type: Smoker Significant Maternal Medications:  None Significant Maternal Lab Results: None  No results found for this or any previous visit (from the past 24 hour(s)).  Patient Active Problem List   Diagnosis Date Noted  . Sickle cell trait (  HCC) 05/01/2018  . BMI 35-39 05/01/2018  . Obesity in pregnancy 05/01/2018  . GBS bacteriuria 04/28/2018  . Supervision of normal pregnancy, antepartum 04/27/2018  . Current smoker 01/11/2014    Assessment: Ward Chatterseresa M Lilley is a 28 y.o. [id number] at 8567w0d here for IOL at term   #Labor: Closed. Will start cytotec and FB when able.  #Pain: Desires natural delivery #FWB: Cat 2, reassuring #ID:  GBS bacteruria #MOF: breast #MOC: BTL #Circ:  NA  Gwenevere AbbotNimeka Jeyla Bulger, MD  11/14/2018, 2:22 AM

## 2018-11-14 NOTE — Anesthesia Preprocedure Evaluation (Deleted)
Anesthesia Evaluation  Patient identified by MRN, date of birth, ID band Patient awake    Reviewed: Allergy & Precautions, NPO status , Patient's Chart, lab work & pertinent test results  Airway Mallampati: III  TM Distance: >3 FB Neck ROM: Full    Dental no notable dental hx. (+) Teeth Intact   Pulmonary former smoker,    Pulmonary exam normal breath sounds clear to auscultation       Cardiovascular Exercise Tolerance: Good negative cardio ROS Normal cardiovascular exam Rhythm:Regular Rate:Normal     Neuro/Psych  Headaches,    GI/Hepatic negative GI ROS, Neg liver ROS,   Endo/Other  negative endocrine ROS  Renal/GU negative Renal ROS     Musculoskeletal   Abdominal (+) + obese,   Peds  Hematology Hgb 11.6 Plt 169   Anesthesia Other Findings   Reproductive/Obstetrics (+) Pregnancy                             Anesthesia Physical Anesthesia Plan  ASA: III  Anesthesia Plan: Epidural   Post-op Pain Management:    Induction:   PONV Risk Score and Plan:   Airway Management Planned:   Additional Equipment:   Intra-op Plan:   Post-operative Plan:   Informed Consent: I have reviewed the patients History and Physical, chart, labs and discussed the procedure including the risks, benefits and alternatives for the proposed anesthesia with the patient or authorized representative who has indicated his/her understanding and acceptance.       Plan Discussed with:   Anesthesia Plan Comments: (28 y/o for Labor epidural)        Anesthesia Quick Evaluation

## 2018-11-15 ENCOUNTER — Inpatient Hospital Stay (HOSPITAL_COMMUNITY): Payer: Medicaid Other | Admitting: Anesthesiology

## 2018-11-15 ENCOUNTER — Encounter (HOSPITAL_COMMUNITY)
Admission: RE | Disposition: A | Discharge status: Home / Self Care | Payer: Self-pay | Source: Home / Self Care | Attending: Obstetrics & Gynecology

## 2018-11-15 DIAGNOSIS — Z302 Encounter for sterilization: Secondary | ICD-10-CM

## 2018-11-15 DIAGNOSIS — O480 Post-term pregnancy: Secondary | ICD-10-CM

## 2018-11-15 DIAGNOSIS — Z3A40 40 weeks gestation of pregnancy: Secondary | ICD-10-CM

## 2018-11-15 DIAGNOSIS — O99824 Streptococcus B carrier state complicating childbirth: Secondary | ICD-10-CM

## 2018-11-15 HISTORY — PX: TUBAL LIGATION: SHX77

## 2018-11-15 SURGERY — LIGATION, FALLOPIAN TUBE, POSTPARTUM
Anesthesia: Spinal | Laterality: Bilateral

## 2018-11-15 MED ORDER — BUPIVACAINE HCL (PF) 0.75 % IJ SOLN
INTRAMUSCULAR | Status: DC | PRN
  Administered 2018-11-15: 1.6 mL via INTRATHECAL

## 2018-11-15 MED ORDER — SODIUM CHLORIDE 0.9 % IR SOLN
Status: DC | PRN
  Administered 2018-11-15: 1

## 2018-11-15 MED ORDER — MIDAZOLAM HCL 2 MG/2ML IJ SOLN
INTRAMUSCULAR | Status: AC
  Filled 2018-11-15: qty 2

## 2018-11-15 MED ORDER — FAMOTIDINE 20 MG PO TABS
40.0000 mg | ORAL_TABLET | Freq: Once | ORAL | Status: AC
  Administered 2018-11-15: 40 mg via ORAL
  Filled 2018-11-15: qty 2

## 2018-11-15 MED ORDER — METOCLOPRAMIDE HCL 10 MG PO TABS
10.0000 mg | ORAL_TABLET | Freq: Once | ORAL | Status: AC
  Administered 2018-11-15: 10 mg via ORAL
  Filled 2018-11-15: qty 1

## 2018-11-15 MED ORDER — FENTANYL CITRATE (PF) 100 MCG/2ML IJ SOLN
25.0000 ug | INTRAMUSCULAR | Status: DC | PRN
  Administered 2018-11-15: 50 ug via INTRAVENOUS

## 2018-11-15 MED ORDER — BUPIVACAINE HCL (PF) 0.25 % IJ SOLN
INTRAMUSCULAR | Status: AC
  Filled 2018-11-15: qty 30

## 2018-11-15 MED ORDER — BUPIVACAINE HCL (PF) 0.25 % IJ SOLN
INTRAMUSCULAR | Status: DC | PRN
  Administered 2018-11-15: 30 mL

## 2018-11-15 MED ORDER — FENTANYL CITRATE (PF) 100 MCG/2ML IJ SOLN
INTRAMUSCULAR | Status: DC | PRN
  Administered 2018-11-15 (×2): 50 ug via INTRAVENOUS

## 2018-11-15 MED ORDER — KETOROLAC TROMETHAMINE 30 MG/ML IJ SOLN
INTRAMUSCULAR | Status: AC
  Filled 2018-11-15: qty 1

## 2018-11-15 MED ORDER — ONDANSETRON HCL 4 MG/2ML IJ SOLN
INTRAMUSCULAR | Status: AC
  Filled 2018-11-15: qty 2

## 2018-11-15 MED ORDER — FENTANYL CITRATE (PF) 100 MCG/2ML IJ SOLN
INTRAMUSCULAR | Status: AC
  Filled 2018-11-15: qty 2

## 2018-11-15 MED ORDER — LACTATED RINGERS IV SOLN
INTRAVENOUS | Status: DC
  Administered 2018-11-15: 10 mL/h via INTRAVENOUS

## 2018-11-15 MED ORDER — FENTANYL CITRATE (PF) 100 MCG/2ML IJ SOLN
INTRAMUSCULAR | Status: AC
  Administered 2018-11-15: 50 ug via INTRAVENOUS
  Filled 2018-11-15: qty 2

## 2018-11-15 MED ORDER — KETOROLAC TROMETHAMINE 30 MG/ML IJ SOLN
INTRAMUSCULAR | Status: DC | PRN
  Administered 2018-11-15: 30 mg via INTRAVENOUS

## 2018-11-15 MED ORDER — LIDOCAINE HCL (PF) 1 % IJ SOLN
INTRAMUSCULAR | Status: AC
  Filled 2018-11-15: qty 5

## 2018-11-15 MED ORDER — MIDAZOLAM HCL 2 MG/2ML IJ SOLN
INTRAMUSCULAR | Status: DC | PRN
  Administered 2018-11-15 (×2): 1 mg via INTRAVENOUS

## 2018-11-15 SURGICAL SUPPLY — 24 items
CLIP FILSHIE TUBAL LIGA STRL (Clip) ×2 IMPLANT
CLOTH BEACON ORANGE TIMEOUT ST (SAFETY) ×2 IMPLANT
DRSG OPSITE POSTOP 3X4 (GAUZE/BANDAGES/DRESSINGS) ×2 IMPLANT
DURAPREP 26ML APPLICATOR (WOUND CARE) ×2 IMPLANT
GLOVE BIO SURGEON STRL SZ7.5 (GLOVE) ×2 IMPLANT
GLOVE BIOGEL PI IND STRL 7.0 (GLOVE) ×2 IMPLANT
GLOVE BIOGEL PI INDICATOR 7.0 (GLOVE) ×2
GOWN STRL REUS W/TWL LRG LVL3 (GOWN DISPOSABLE) ×2 IMPLANT
GOWN STRL REUS W/TWL XL LVL3 (GOWN DISPOSABLE) ×2 IMPLANT
NDL SAFETY ECLIPSE 18X1.5 (NEEDLE) IMPLANT
NEEDLE HYPO 18GX1.5 SHARP (NEEDLE) ×2
NEEDLE HYPO 22GX1.5 SAFETY (NEEDLE) ×3 IMPLANT
NS IRRIG 1000ML POUR BTL (IV SOLUTION) ×2 IMPLANT
PACK ABDOMINAL MINOR (CUSTOM PROCEDURE TRAY) ×2 IMPLANT
PROTECTOR NERVE ULNAR (MISCELLANEOUS) ×2 IMPLANT
SPONGE LAP 4X18 RFD (DISPOSABLE) IMPLANT
SUT MNCRL AB 4-0 PS2 18 (SUTURE) ×2 IMPLANT
SUT PLAIN 0 NONE (SUTURE) ×2 IMPLANT
SUT VIC AB 0 CT1 27 (SUTURE) ×2
SUT VIC AB 0 CT1 27XBRD ANBCTR (SUTURE) ×1 IMPLANT
SYR CONTROL 10ML LL (SYRINGE) ×3 IMPLANT
TOWEL OR 17X24 6PK STRL BLUE (TOWEL DISPOSABLE) ×4 IMPLANT
TRAY FOLEY CATH SILVER 14FR (SET/KITS/TRAYS/PACK) ×2 IMPLANT
WATER STERILE IRR 1000ML POUR (IV SOLUTION) ×2 IMPLANT

## 2018-11-15 NOTE — Progress Notes (Signed)
Verified that blood bank did NOT need to obtain another unit of blood for patients upcoming tubal and that one unit with special antibody would suffice.

## 2018-11-15 NOTE — Anesthesia Procedure Notes (Signed)
Spinal  Patient location during procedure: OR Start time: 11/15/2018 4:45 PM End time: 11/15/2018 5:01 PM Staffing Anesthesiologist: Elmer PickerWoodrum, Analy Bassford L, MD Performed: anesthesiologist  Preanesthetic Checklist Completed: patient identified, surgical consent, pre-op evaluation, timeout performed, IV checked, risks and benefits discussed and monitors and equipment checked Spinal Block Patient position: sitting Prep: site prepped and draped and DuraPrep Patient monitoring: cardiac monitor, continuous pulse ox and blood pressure Approach: midline Location: L3-4 Injection technique: single-shot Needle Needle type: Pencan and Tuohy  Needle gauge: 24 G Needle length: 9 cm Assessment Sensory level: T6 Additional Notes Functioning IV was confirmed and monitors were applied. Sterile prep and drape, including hand hygiene and sterile gloves were used. The patient was positioned and the spine was prepped. The skin was anesthetized with lidocaine.  Free flow of clear CSF was obtained prior to injecting local anesthetic into the CSF.  The spinal needle aspirated freely following injection.  The needle was carefully withdrawn.  The patient tolerated the procedure well.

## 2018-11-15 NOTE — Progress Notes (Signed)
Post Partum Day #1 s/p SVD at 1200 for eIOL Subjective: no complaints, up ad lib, voiding, tolerating PO and + flatus Patient reports that pain is well controlled with PRN meds and vaginal bleeding is improving (used 2 pads overnight). Denies nausea, vomiting, dizziness and SOB. Reports minimal bilateral ankle edema, no pain or erythema.  Objective: Blood pressure 118/80, pulse 84, temperature 98.3 F (36.8 C), temperature source Oral, resp. rate 18, height 4\' 11"  (1.499 m), weight 95.7 kg, last menstrual period 02/07/2018, SpO2 98 %, unknown if currently breastfeeding.  Physical Exam:  General: alert, cooperative and no distress Lochia: appropriate Uterine Fundus: firm Incision: n/a DVT Evaluation: No evidence of DVT seen on physical exam. No cords or calf tenderness. No significant calf/ankle edema. Cardio: RRR Pulm: CTA  Recent Labs    11/14/18 0355  HGB 11.6*  HCT 34.7*    Assessment/Plan: BTL today at 3pm- NPO after breakfast.  MOF: breast and bottle, has been seen by lactation specialist     LOS: 1 day   Katelyn Mcfarland 11/15/2018, 7:29 AM

## 2018-11-15 NOTE — Lactation Note (Signed)
This note was copied from a baby's chart. Lactation Consultation Note  Patient Name: Girl Nakhia Hamburger ALPFX'T Date: 11/15/2018 Reason for consult: Follow-up assessment;Term Baby is 34 hours old and at a 4% weight loss.  Mom states baby is latching frequently to the breast.  She reports that initially the baby latches to nipple but is able to get more depth after a f few minutes.  Mom chooses to also supplement some feedings with formula when baby still acts hungry.  Recommended she put baby to both breasts prior to formula supplementation.  Encouraged to call for assist/concerns prn.  Maternal Data    Feeding Feeding Type: Breast Fed  LATCH Score Latch: Repeated attempts needed to sustain latch, nipple held in mouth throughout feeding, stimulation needed to elicit sucking reflex.  Audible Swallowing: A few with stimulation  Type of Nipple: Everted at rest and after stimulation  Comfort (Breast/Nipple): Filling, red/small blisters or bruises, mild/mod discomfort  Hold (Positioning): Assistance needed to correctly position infant at breast and maintain latch.  LATCH Score: 6  Interventions    Lactation Tools Discussed/Used     Consult Status Consult Status: Follow-up Date: 11/16/18 Follow-up type: In-patient    Huston Foley 11/15/2018, 2:41 PM

## 2018-11-15 NOTE — Op Note (Signed)
Lelar Grygiel Putzier 11/14/2018 - 11/15/2018  PREOPERATIVE DIAGNOSIS:  Undesired fertility  POSTOPERATIVE DIAGNOSIS:  Undesired fertility  PROCEDURE:  Postpartum Bilateral Tubal Sterilization using Filshie Clips   SURGEON: Surgeon(s) and Role:    * Hermina Staggers, MD - Primary    * Arvilla Market, DO - Assisting -OB Fellow  ANESTHESIA:  Epidural  COMPLICATIONS:  None immediate.  ESTIMATED BLOOD LOSS: Minimal, approximately 5 cc  FLUIDS: 900 cc LR.  URINE OUTPUT:  In and out catheter performed in OR immediately post-op   INDICATIONS: 28 y.o. yo [id number]  with undesired fertility,status post vaginal delivery, desires permanent sterilization. Risks and benefits of procedure discussed with patient including permanence of method, bleeding, infection, injury to surrounding organs and need for additional procedures. Risk failure of 0.5-1% with increased risk of ectopic gestation if pregnancy occurs was also discussed with patient.   FINDINGS:  Normal uterus, tubes, and ovaries.  TECHNIQUE:  The patient was taken to the operating room where her epidural anesthesia was dosed up to surgical level and found to be adequate.  She was then placed in the dorsal supine position and prepped and draped in sterile fashion.  After an adequate timeout was performed, attention was turned to the patient's abdomen where a small transverse skin incision was made under the umbilical fold. The incision was taken down to the layer of fascia using the scalpel, and fascia was incised, and extended bilaterally using Mayo scissors. The peritoneum was entered in a sharp fashion. Attention was then turned to the patient's uterus, and left fallopian tube was identified and followed out to the fimbriated end.  A Filshie clip was placed on the right fallopian tube about 2 cm from the cornual attachment, with care given to incorporate the underlying mesosalpinx.  A similar process was carried out on the left side allowing  for bilateral tubal sterilization.  Good hemostasis was noted overall.  The instruments were then removed from the patient's abdomen and the fascial incision was repaired with 0 Vicryl, and the skin was closed with a 3-0 Monocryl subcuticular stitch. 30 cc of local analgesia was injected at the incision site. The patient tolerated the procedure well.  Sponge, lap, and needle counts were correct times two.  The patient was then taken to the recovery room awake, extubated and in stable condition.  Marcy Siren, D.O. OB FELLOW  11/15/2018, 5:36 PM

## 2018-11-15 NOTE — Progress Notes (Signed)
    Patient desires bilateral tubal sterilization.  Other reversible forms of contraception were discussed with patient; she declines all other modalities. Discussed bilateral tubal sterilization in detail; discussed options of laparoscopic bilateral tubal sterilization using Filshie clips vs laparoscopic bilateral salpingectomy. Risks and benefits discussed in detail including but not limited to: risk of regret, permanence of method, bleeding, infection, injury to surrounding organs and need for additional procedures.  Failure risk of 1-2 % for Filshie clips and <1% for bilateral salpingectomy with increased risk of ectopic gestation if pregnancy occurs was also discussed with patient.  Also discussed possible reduction of risk of ovarian cancer via bilateral salpingectomy given that a growing body of knowledge reveals that the majority of cases of high grade serous "ovarian" cancer actually are actually  cancers arising from the fimbriated end of the fallopian tubes. Emphasized that removal of fallopian tubes do not result in any known hormonal imbalance.  Patient verbalized understanding of these risks and benefits and wants to proceed with sterilization with laparoscopic bilateral sterilization using Filshie clips.     Medicaid papers have been signed , patient understands that surgery will be scheduled at least 30 days after the day papers are signed as per Medicaid guidelines.
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
## 2018-11-15 NOTE — Anesthesia Preprocedure Evaluation (Addendum)
Anesthesia Evaluation  Patient identified by MRN, date of birth, ID band Patient awake    Reviewed: Allergy & Precautions, NPO status , Patient's Chart, lab work & pertinent test results  Airway Mallampati: III  TM Distance: >3 FB Neck ROM: Full  Mouth opening: Limited Mouth Opening  Dental no notable dental hx. (+) Teeth Intact, Dental Advisory Given   Pulmonary neg pulmonary ROS, former smoker,    Pulmonary exam normal breath sounds clear to auscultation       Cardiovascular negative cardio ROS Normal cardiovascular exam Rhythm:Regular Rate:Normal     Neuro/Psych PSYCHIATRIC DISORDERS Depression negative neurological ROS     GI/Hepatic negative GI ROS, Neg liver ROS,   Endo/Other  Morbid obesity  Renal/GU negative Renal ROS  negative genitourinary   Musculoskeletal negative musculoskeletal ROS (+)   Abdominal   Peds  Hematology negative hematology ROS (+) Sickle cell trait ,   Anesthesia Other Findings   Reproductive/Obstetrics (+) Pregnancy                           Anesthesia Physical Anesthesia Plan  ASA: III  Anesthesia Plan: Spinal   Post-op Pain Management:    Induction:   PONV Risk Score and Plan: Treatment may vary due to age or medical condition  Airway Management Planned: Natural Airway  Additional Equipment:   Intra-op Plan:   Post-operative Plan:   Informed Consent: I have reviewed the patients History and Physical, chart, labs and discussed the procedure including the risks, benefits and alternatives for the proposed anesthesia with the patient or authorized representative who has indicated his/her understanding and acceptance.     Dental advisory given  Plan Discussed with: CRNA  Anesthesia Plan Comments:         Anesthesia Quick Evaluation

## 2018-11-15 NOTE — Transfer of Care (Signed)
Immediate Anesthesia Transfer of Care Note  Patient: Katelyn Mcfarland  Procedure(s) Performed: POST PARTUM TUBAL LIGATION (Bilateral )  Patient Location: PACU  Anesthesia Type:Spinal  Level of Consciousness: sedated  Airway & Oxygen Therapy: Patient Spontanous Breathing  Post-op Assessment: Report given to RN  Post vital signs: Reviewed  Last Vitals:  Vitals Value Taken Time  BP    Temp    Pulse 89 11/15/2018  5:43 PM  Resp    SpO2 98 % 11/15/2018  5:43 PM  Vitals shown include unvalidated device data.  Last Pain:  Vitals:   11/15/18 1537  TempSrc: Oral  PainSc:       Patients Stated Pain Goal: 4 (11/14/18 1509)  Complications: No apparent anesthesia complications

## 2018-11-16 ENCOUNTER — Encounter (HOSPITAL_COMMUNITY): Payer: Self-pay | Admitting: Obstetrics and Gynecology

## 2018-11-16 MED ORDER — SENNOSIDES-DOCUSATE SODIUM 8.6-50 MG PO TABS: 2.0000 | ORAL_TABLET | ORAL | 0 refills | Status: DC

## 2018-11-16 MED ORDER — OXYCODONE HCL 5 MG PO TABS: 5.0000 mg | ORAL_TABLET | Freq: Four times a day (QID) | ORAL | 0 refills | Status: AC | PRN

## 2018-11-16 MED ORDER — IBUPROFEN 600 MG PO TABS: 600.0000 mg | ORAL_TABLET | Freq: Four times a day (QID) | ORAL | 0 refills | Status: DC

## 2018-11-16 NOTE — Lactation Note (Signed)
This note was copied from a baby's chart. Lactation Consultation Note; Mom getting ready for DC. Baby in nursery. Reports she is having some trouble getting baby to open wide and deep onto the breast. Has been bottle feeding formula also.  Reports nipples are tender. Using EBM and coconut oil to nipples. Reviewed engorgement prevention and treatment. Does not have pump for home. Manual pump given with instructions for setup, use and cleaning of pump pieces. Reports she has used this pump before. Reports it hurt when she used pump. Larger flanges given for mom to try #27 and #30. No questions at present. Reviewed our phone number, OP appointments and BFSG as resources for support after DC.To call prn  Patient Name: Katelyn Mcfarland [id number] Date: 11/16/2018 Reason for consult: Follow-up assessment   Maternal Data Formula Feeding for Exclusion: Yes Reason for exclusion: Mother's choice to formula and breast feed on admission Has patient been taught Hand Expression?: Yes Does the patient have breastfeeding experience prior to this delivery?: Yes  Feeding Feeding Type: Bottle Fed - Formula Nipple Type: Slow - flow  LATCH Score                   Interventions    Lactation Tools Discussed/Used WIC Program: Yes Pump Review: Setup, frequency, and cleaning Initiated by:: DW Date initiated:: 11/16/18   Consult Status Consult Status: Complete    Pamelia Hoit 11/16/2018, 10:50 AM

## 2018-11-16 NOTE — Anesthesia Postprocedure Evaluation (Signed)
Anesthesia Post Note  Patient: Katelyn Mcfarland  Procedure(s) Performed: POST PARTUM TUBAL LIGATION (Bilateral )     Patient location during evaluation: Mother Baby Anesthesia Type: Spinal Level of consciousness: awake and alert and oriented Pain management: satisfactory to patient Vital Signs Assessment: post-procedure vital signs reviewed and stable Respiratory status: respiratory function stable and spontaneous breathing Cardiovascular status: blood pressure returned to baseline Postop Assessment: no headache, no backache, spinal receding, patient able to bend at knees and adequate PO intake Anesthetic complications: no    Last Vitals:  Vitals:   11/16/18 0202 11/16/18 0600  BP: 131/85 (!) 138/95  Pulse: 78 86  Resp: 18 18  Temp: 36.9 C 36.8 C  SpO2: 95% 100%    Last Pain:  Vitals:   11/16/18 0600  TempSrc: Oral  PainSc:    Pain Goal: Patients Stated Pain Goal: 4 (11/14/18 1509)                 Tevis Dunavan

## 2018-11-17 ENCOUNTER — Encounter: Payer: Self-pay | Admitting: Certified Nurse Midwife

## 2018-11-17 ENCOUNTER — Encounter (HOSPITAL_COMMUNITY): Payer: Self-pay

## 2018-11-18 LAB — TYPE AND SCREEN
ABO/RH(D): O POS
Antibody Screen: POSITIVE
Donor AG Type: NEGATIVE
Unit division: 0

## 2018-11-18 LAB — BPAM RBC
Blood Product Expiration Date: 202002222359
Unit Type and Rh: 9500

## 2018-11-24 ENCOUNTER — Other Ambulatory Visit: Payer: Self-pay

## 2018-11-24 ENCOUNTER — Ambulatory Visit: Payer: Medicaid Other

## 2018-11-24 ENCOUNTER — Inpatient Hospital Stay (HOSPITAL_COMMUNITY)
Admission: AD | Admit: 2018-11-24 | Discharge: 2018-11-24 | Disposition: A | Discharge status: Home / Self Care | Payer: Medicaid Other | Attending: Obstetrics & Gynecology | Admitting: Obstetrics & Gynecology

## 2018-11-24 ENCOUNTER — Telehealth: Payer: Self-pay | Admitting: *Deleted

## 2018-11-24 ENCOUNTER — Encounter (HOSPITAL_COMMUNITY): Payer: Self-pay | Admitting: Anesthesiology

## 2018-11-24 ENCOUNTER — Encounter (HOSPITAL_COMMUNITY): Payer: Self-pay | Admitting: *Deleted

## 2018-11-24 DIAGNOSIS — O9089 Other complications of the puerperium, not elsewhere classified: Secondary | ICD-10-CM | POA: Insufficient documentation

## 2018-11-24 DIAGNOSIS — N3001 Acute cystitis with hematuria: Secondary | ICD-10-CM | POA: Diagnosis not present

## 2018-11-24 DIAGNOSIS — Z87891 Personal history of nicotine dependence: Secondary | ICD-10-CM | POA: Insufficient documentation

## 2018-11-24 DIAGNOSIS — R51 Headache: Secondary | ICD-10-CM | POA: Diagnosis present

## 2018-11-24 DIAGNOSIS — O165 Unspecified maternal hypertension, complicating the puerperium: Secondary | ICD-10-CM | POA: Diagnosis not present

## 2018-11-24 DIAGNOSIS — G44209 Tension-type headache, unspecified, not intractable: Secondary | ICD-10-CM | POA: Insufficient documentation

## 2018-11-24 DIAGNOSIS — O8622 Infection of bladder following delivery: Secondary | ICD-10-CM | POA: Diagnosis not present

## 2018-11-24 LAB — CBC WITH DIFFERENTIAL/PLATELET
Basophils Absolute: 0 10*3/uL (ref 0.0–0.1)
Basophils Relative: 0 %
Eosinophils Absolute: 0 10*3/uL (ref 0.0–0.5)
Eosinophils Relative: 0 %
HCT: 36.5 % (ref 36.0–46.0)
Hemoglobin: 11.9 g/dL — ABNORMAL LOW (ref 12.0–15.0)
Lymphocytes Relative: 9 %
Lymphs Abs: 0.8 10*3/uL (ref 0.7–4.0)
MCH: 28.5 pg (ref 26.0–34.0)
MCHC: 32.6 g/dL (ref 30.0–36.0)
MCV: 87.3 fL (ref 80.0–100.0)
Monocytes Absolute: 0.5 10*3/uL (ref 0.1–1.0)
Monocytes Relative: 6 %
Neutro Abs: 7.5 10*3/uL (ref 1.7–7.7)
Neutrophils Relative %: 85 %
Platelets: 206 10*3/uL (ref 150–400)
RBC: 4.18 MIL/uL (ref 3.87–5.11)
RDW: 14.6 % (ref 11.5–15.5)
WBC: 8.8 10*3/uL (ref 4.0–10.5)
nRBC: 0 % (ref 0.0–0.2)

## 2018-11-24 LAB — COMPREHENSIVE METABOLIC PANEL
ALT: 12 U/L (ref 0–44)
AST: 17 U/L (ref 15–41)
Albumin: 3.4 g/dL — ABNORMAL LOW (ref 3.5–5.0)
Alkaline Phosphatase: 80 U/L (ref 38–126)
Anion gap: 9 (ref 5–15)
BUN: 9 mg/dL (ref 6–20)
CO2: 23 mmol/L (ref 22–32)
Calcium: 8.3 mg/dL — ABNORMAL LOW (ref 8.9–10.3)
Chloride: 105 mmol/L (ref 98–111)
Creatinine, Ser: 1.02 mg/dL — ABNORMAL HIGH (ref 0.44–1.00)
GFR calc Af Amer: >60 mL/min (ref >60)
GFR calc non Af Amer: >60 mL/min (ref >60)
Glucose, Bld: 95 mg/dL (ref 70–99)
Potassium: 3.3 mmol/L — ABNORMAL LOW (ref 3.5–5.1)
Sodium: 137 mmol/L (ref 135–145)
Total Bilirubin: 0.7 mg/dL (ref 0.3–1.2)
Total Protein: 7.1 g/dL (ref 6.5–8.1)

## 2018-11-24 LAB — URINALYSIS, MICROSCOPIC (REFLEX): WBC, UA: >50 WBC/hpf (ref 0–5)

## 2018-11-24 LAB — URINALYSIS, ROUTINE W REFLEX MICROSCOPIC
Bilirubin Urine: NEGATIVE
Glucose, UA: NEGATIVE mg/dL
Ketones, ur: NEGATIVE mg/dL
Nitrite: NEGATIVE
Protein, ur: NEGATIVE mg/dL
Specific Gravity, Urine: 1.015 (ref 1.005–1.030)
pH: 6 (ref 5.0–8.0)

## 2018-11-24 LAB — PROTEIN / CREATININE RATIO, URINE
Creatinine, Urine: 126 mg/dL
Protein Creatinine Ratio: 0.32 mg/mg{Cre} — ABNORMAL HIGH (ref 0.00–0.15)
Total Protein, Urine: 40 mg/dL

## 2018-11-24 LAB — LACTIC ACID, PLASMA: Lactic Acid, Venous: 1.1 mmol/L (ref 0.5–1.9)

## 2018-11-24 MED ORDER — LACTATED RINGERS IV BOLUS: 1000.0000 mL | Freq: Once | INTRAVENOUS | Status: DC

## 2018-11-24 MED ORDER — SODIUM CHLORIDE 0.9 % IV SOLN
2.0000 g | Freq: Once | INTRAVENOUS | Status: AC
  Administered 2018-11-24: 2 g via INTRAVENOUS
  Filled 2018-11-24: qty 2

## 2018-11-24 MED ORDER — DEXAMETHASONE SODIUM PHOSPHATE 10 MG/ML IJ SOLN
10.0000 mg | Freq: Once | INTRAMUSCULAR | Status: AC
  Administered 2018-11-24: 10 mg via INTRAVENOUS
  Filled 2018-11-24: qty 1

## 2018-11-24 MED ORDER — METOCLOPRAMIDE HCL 5 MG/ML IJ SOLN
10.0000 mg | Freq: Once | INTRAMUSCULAR | Status: AC
  Administered 2018-11-24: 10 mg via INTRAVENOUS
  Filled 2018-11-24: qty 2

## 2018-11-24 MED ORDER — SODIUM CHLORIDE 0.9 % IV SOLN
Freq: Once | INTRAVENOUS | Status: AC
  Administered 2018-11-24: 20:00:00 via INTRAVENOUS

## 2018-11-24 MED ORDER — ACETAMINOPHEN 325 MG PO TABS
650.0000 mg | ORAL_TABLET | Freq: Four times a day (QID) | ORAL | Status: DC | PRN
  Administered 2018-11-24: 650 mg via ORAL
  Filled 2018-11-24: qty 2

## 2018-11-24 MED ORDER — AMLODIPINE BESYLATE 10 MG PO TABS: 10.0000 mg | ORAL_TABLET | Freq: Every day | ORAL | 0 refills | Status: DC

## 2018-11-24 MED ORDER — LACTATED RINGERS IV BOLUS
1000.0000 mL | Freq: Once | INTRAVENOUS | Status: AC
  Administered 2018-11-24: 1000 mL via INTRAVENOUS

## 2018-11-24 MED ORDER — DIPHENHYDRAMINE HCL 50 MG/ML IJ SOLN
25.0000 mg | Freq: Once | INTRAMUSCULAR | Status: AC
  Administered 2018-11-24: 25 mg via INTRAVENOUS
  Filled 2018-11-24: qty 1

## 2018-11-24 MED ORDER — CEPHALEXIN 500 MG PO CAPS: 500.0000 mg | ORAL_CAPSULE | Freq: Two times a day (BID) | ORAL | 0 refills | Status: AC

## 2018-11-24 NOTE — MAU Provider Note (Signed)
Prior to discharge, pt had 4 severe range systolic BPs Vitals:   11/24/18 2135 11/24/18 2140 11/24/18 2145 11/24/18 2146  BP:    (!) 164/97  Pulse:    93  Resp:      Temp:      TempSrc:      SpO2: 97% 96% 96%    164/97Abnormal   -  -  -  - CP             11/24/18 2145  -  -  -  -  -  -  96 %  -  - CP   11/24/18 2140  -  -  -  -  -  -  96 %  -  - CP   11/24/18 2135  -  -  -  -  -  -  97 %  -  - CP   11/24/18 2132  -  97  -  -  175/88Abnormal   -  96 %  -  - CP   11/24/18 2125  -  -  -  -  -  -  96 %  -  - CP   11/24/18 2120  -  -  -  -  -  -  96 %  -  - CP   11/24/18 2116  -  100  -  -  174/86Abnormal   -  -  -  - CP   11/24/18 2115  -  -  -  -  -  -  100 %  -  - CP   11/24/18 2110  -  -  -  -  -  -  100 %  -  - CP   11/24/18 2105  -  -  -  -  -  -  100 %  -  - CP   11/24/18 2101  -  106Abnormal   -  -  157/90Abnormal              Consulted Dr Despina Hidden Will discharge home on Norvasc 10mg  daily  Encouraged to return here or to other Urgent Care/ED if she develops worsening of symptoms, increase in pain, fever, or other concerning symptoms.    Aviva Signs, CNM

## 2018-11-24 NOTE — Progress Notes (Signed)
Katelyn Mcfarland, NT with RN supervision attempted perform straight cath, no urine obtained despite catheter in urinary meatus.

## 2018-11-24 NOTE — Telephone Encounter (Signed)
Received call from [facility] nurse regarding pt BP at visit. Nikki from [employer] pt BP was 140/90 on 11/23/2018, pt having HA x 2 days. States pt also scored 19 on Edinburgh Dep Screen.   Spoke with pt today. Pt states she is still having HA's and back pain. Pt has been schedule for nurse visit BP check today.

## 2018-11-24 NOTE — MAU Note (Signed)
Urine in lab 

## 2018-11-24 NOTE — MAU Note (Signed)
Presents with c/o H/A, right flank pain, lower back pain, and neck pain.  Hasn't taken Ibuprofen for pain since yesterday.  S/P NVD 11/14/2018.

## 2018-11-24 NOTE — Discharge Instructions (Signed)
Urinary Tract Infection, Adult A urinary tract infection (UTI) is an infection of any part of the urinary tract. The urinary tract includes:  The kidneys.  The ureters.  The bladder.  The urethra. These organs make, store, and get rid of pee (urine) in the body. What are the causes? This is caused by germs (bacteria) in your genital area. These germs grow and cause swelling (inflammation) of your urinary tract. What increases the risk? You are more likely to develop this condition if:  You have a small, thin tube (catheter) to drain pee.  You cannot control when you pee or poop (incontinence).  You are female, and: ? You use these methods to prevent pregnancy: ? A medicine that kills sperm (spermicide). ? A device that blocks sperm (diaphragm). ? You have low levels of a female hormone (estrogen). ? You are pregnant.  You have genes that add to your risk.  You are sexually active.  You take antibiotic medicines.  You have trouble peeing because of: ? A prostate that is bigger than normal, if you are female. ? A blockage in the part of your body that drains pee from the bladder (urethra). ? A kidney stone. ? A nerve condition that affects your bladder (neurogenic bladder). ? Not getting enough to drink. ? Not peeing often enough.  You have other conditions, such as: ? Diabetes. ? A weak disease-fighting system (immune system). ? Sickle cell disease. ? Gout. ? Injury of the spine. What are the signs or symptoms? Symptoms of this condition include:  Needing to pee right away (urgently).  Peeing often.  Peeing small amounts often.  Pain or burning when peeing.  Blood in the pee.  Pee that smells bad or not like normal.  Trouble peeing.  Pee that is cloudy.  Fluid coming from the vagina, if you are female.  Pain in the belly or lower back. Other symptoms include:  Throwing up (vomiting).  No urge to eat.  Feeling mixed up (confused).  Being tired  and grouchy (irritable).  A fever.  Watery poop (diarrhea). How is this treated? This condition may be treated with:  Antibiotic medicine.  Other medicines.  Drinking enough water. Follow these instructions at home:  Medicines  Take over-the-counter and prescription medicines only as told by your doctor.  If you were prescribed an antibiotic medicine, take it as told by your doctor. Do not stop taking it even if you start to feel better. General instructions  Make sure you: ? Pee until your bladder is empty. ? Do not hold pee for a long time. ? Empty your bladder after sex. ? Wipe from front to back after pooping if you are a female. Use each tissue one time when you wipe.  Drink enough fluid to keep your pee pale yellow.  Keep all follow-up visits as told by your doctor. This is important. Contact a doctor if:  You do not get better after 1-2 days.  Your symptoms go away and then come back. Get help right away if:  You have very bad back pain.  You have very bad pain in your lower belly.  You have a fever.  You are sick to your stomach (nauseous).  You are throwing up. Summary  A urinary tract infection (UTI) is an infection of any part of the urinary tract.  This condition is caused by germs in your genital area.  There are many risk factors for a UTI. These include having a small, thin   tube to drain pee and not being able to control when you pee or poop.  Treatment includes antibiotic medicines for germs.  Drink enough fluid to keep your pee pale yellow. This information is not intended to replace advice given to you by your health care provider. Make sure you discuss any questions you have with your health care provider. Document Released: 03/17/2008 Document Revised: 04/08/2018 Document Reviewed: 04/08/2018 Elsevier Interactive Patient Education  2019 [employer]  

## 2018-11-24 NOTE — MAU Provider Note (Signed)
History     CSN: [id number]  Arrival date and time: 11/24/18 4098   Chief Complaint  Patient presents with  . Headache  . Back Pain  . Flank Pain  . Nausea   28 y.o. female 8 days post SVD and BTL presenting with HA, neck pain, and back pain. HA started 3 days ago. Located frontal. Rates 10/10. Took Ibuprofen and Oxycodone yesterday but didn't help. Back pain started yesterday. Located in lower back and central. Rates 10/10. Endorses dysuria and polyuria. Denies respiratory sx. Had nausea today. Had nothing po today. Denies abd pain. Lochia is small. No malodor.  She is breast pumping, last time was 11am.   Past Medical History:  Diagnosis Date  . Allergy   . Chlamydia    age 67yo; hx/o trichomonas age 84yo  . Chronic headache   . Depression    doing ok, declined meds  . High cholesterol   . Infection    UTI  . Vaginal Pap smear, abnormal    bx, cryo    Past Surgical History:  Procedure Laterality Date  . INDUCED ABORTION    . TUBAL LIGATION Bilateral 11/15/2018   Procedure: POST PARTUM TUBAL LIGATION;  Surgeon: Hermina Staggers, MD;  Location: [facility] BIRTHING SUITES;  Service: Gynecology;  Laterality: Bilateral;  . WISDOM TOOTH EXTRACTION      Family History  Problem Relation Age of Onset  . Hypertension Mother   . Diabetes Mother   . Asthma Sister   . Cancer Paternal Grandmother   . Cancer Paternal Grandfather   . Cancer Maternal Grandmother   . Kidney disease Maternal Grandmother   . Diabetes Maternal Grandmother   . Heart disease Neg Hx   . Stroke Neg Hx     Social History   Tobacco Use  . Smoking status: Former Smoker    Packs/day: 0.50    Years: 16.00    Pack years: 8.00    Types: Cigarettes    Last attempt to quit: 2019    Years since quitting: 1.1  . Smokeless tobacco: Never Used  . Tobacco comment: 2every other day  Substance Use Topics  . Alcohol use: Not Currently    Comment: not currently  . Drug use: No    Types: Marijuana    Comment: last  Mar 2018    Allergies: No Known Allergies  Medications Prior to Admission  Medication Sig Dispense Refill Last Dose  . ibuprofen (ADVIL,MOTRIN) 600 MG tablet Take 1 tablet (600 mg total) by mouth every 6 (six) hours. 30 tablet 0   . senna-docusate (SENOKOT-S) 8.6-50 MG tablet Take 2 tablets by mouth daily. 60 tablet 0     Review of Systems  Constitutional: Positive for chills and fever.  Eyes: Negative for visual disturbance.  Gastrointestinal: Positive for nausea. Negative for abdominal pain, constipation, diarrhea and vomiting.  Genitourinary: Positive for dysuria, frequency and vaginal bleeding. Negative for urgency.  Musculoskeletal: Positive for arthralgias, back pain, myalgias and neck pain.  Neurological: Positive for headaches.   Physical Exam   Blood pressure 132/77, pulse (!) 105, temperature (!) 101.1 F (38.4 C), temperature source Oral, resp. rate 20, SpO2 98 %, unknown if currently breastfeeding.  Patient Vitals for the past 24 hrs:  BP Temp Temp src Pulse Resp SpO2  11/24/18 2035 - - - - - 98 %  11/24/18 2031 132/77 - - (!) 105 - -  11/24/18 2030 - - - - - 100 %  11/24/18 2025 - - - - -  98 %  11/24/18 2020 - - - - - 98 %  11/24/18 2016 129/80 - - (!) 108 - -  11/24/18 2015 - - - - - 98 %  11/24/18 2010 - - - - - 98 %  11/24/18 2005 - - - - - 98 %  11/24/18 1950 - - - - - 98 %  11/24/18 1946 (!) 148/94 - - (!) 113 - -  11/24/18 1945 - - - - - 96 %  11/24/18 1940 - - - - - 96 %  11/24/18 1935 - - - - - 96 %  11/24/18 1931 130/84 - - (!) 118 - -  11/24/18 1930 - - - - - 97 %  11/24/18 1925 - (!) 101.1 F (38.4 C) Oral - - 97 %  11/24/18 1920 - - - - - 96 %  11/24/18 1917 (!) 144/90 - - (!) 114 - -  11/24/18 1915 - - - - - 97 %  11/24/18 1910 - - - - - 95 %  11/24/18 1905 - - - - - 95 %  11/24/18 1900 140/64 - - (!) 125 - 95 %  11/24/18 1855 - - - - - 95 %  11/24/18 1850 - - - - - 95 %  11/24/18 1846 136/77 - - (!) 116 - -  11/24/18 1845 - - - - - 95 %   11/24/18 1840 - - - - - 95 %  11/24/18 1835 - - - - - 95 %  11/24/18 1831 (!) 142/74 - - (!) 116 - -  11/24/18 1830 - - - - - 95 %  11/24/18 1825 - - - - - 95 %  11/24/18 1820 - - - - - 96 %  11/24/18 1816 (!) 141/73 - - (!) 114 - -  11/24/18 1815 - - - - - 96 %  11/24/18 1810 - - - - - 96 %  11/24/18 1805 - - - - - 96 %  11/24/18 1800 (!) 157/86 (!) 101.8 F (38.8 C) Oral (!) 111 - 97 %  11/24/18 1755 - - - - - 98 %  11/24/18 1745 - - - - - 95 %  11/24/18 1740 - - - - - 97 %  11/24/18 1735 - - - - - 98 %  11/24/18 1731 (!) 149/85 - - (!) 107 - -  11/24/18 1730 - - - - - 97 %  11/24/18 1717 (!) 146/72 - - (!) 113 - -  11/24/18 1705 - - - - - 98 %  11/24/18 1655 - - - - - 96 %  11/24/18 1650 - - - - - 98 %  11/24/18 1646 131/86 - - (!) 124 - -  11/24/18 1645 - - - - - 97 %  11/24/18 1640 - - - - - 96 %  11/24/18 1635 - - - - - 96 %  11/24/18 1631 (!) 150/90 - - (!) 116 - -  11/24/18 1630 - - - - - 97 %  11/24/18 1625 - - - - - 97 %  11/24/18 1624 (!) 146/88 (!) 103.1 F (39.5 C) Oral (!) 120 20 98 %   Physical Exam  Constitutional: She is oriented to person, place, and time. She appears well-developed and well-nourished. No distress.  HENT:  Head: Normocephalic and atraumatic.  Neck: Normal range of motion.  Cardiovascular: Normal rate, regular rhythm and normal heart sounds.  Respiratory: Effort normal and breath sounds normal. No  respiratory distress. She has no wheezes. She has no rales. Right breast exhibits no inverted nipple, no mass, no nipple discharge, no skin change and no tenderness. Left breast exhibits no inverted nipple, no mass, no nipple discharge, no skin change and no tenderness.  GI: Soft. Bowel sounds are normal. She exhibits no distension and no mass. There is abdominal tenderness. There is no rebound, no guarding and no CVA tenderness.  Genitourinary:    Genitourinary Comments: External: no lesions or erythema Vagina: rugated, pink, moist, small bloody  discharge Uterus: + enlarged, anteverted, + tender, no CMT Adnexae: no masses, no tenderness left, no tenderness right Cervix closed    Musculoskeletal: Normal range of motion.  Neurological: She is alert and oriented to person, place, and time.  Skin: Skin is warm and dry.  Small incision to umbilicus well approximated, no edema, erythema, or drainage  Psychiatric: She has a normal mood and affect.   Results for orders placed or performed during the hospital encounter of 11/24/18 (from the past 24 hour(s))  Urinalysis, Routine w reflex microscopic     Status: Abnormal   Collection Time: 11/24/18  4:22 PM  Result Value Ref Range   Color, Urine YELLOW YELLOW   APPearance HAZY (A) CLEAR   Specific Gravity, Urine 1.015 1.005 - 1.030   pH 6.0 5.0 - 8.0   Glucose, UA NEGATIVE NEGATIVE mg/dL   Hgb urine dipstick LARGE (A) NEGATIVE   Bilirubin Urine NEGATIVE NEGATIVE   Ketones, ur NEGATIVE NEGATIVE mg/dL   Protein, ur NEGATIVE NEGATIVE mg/dL   Nitrite NEGATIVE NEGATIVE   Leukocytes,Ua MODERATE (A) NEGATIVE  Protein / creatinine ratio, urine     Status: Abnormal   Collection Time: 11/24/18  4:22 PM  Result Value Ref Range   Creatinine, Urine 126.00 mg/dL   Total Protein, Urine 40 mg/dL   Protein Creatinine Ratio 0.32 (H) 0.00 - 0.15 mg/mg[Cre]  Urinalysis, Microscopic (reflex)     Status: Abnormal   Collection Time: 11/24/18  4:22 PM  Result Value Ref Range   RBC / HPF 6-10 0 - 5 RBC/hpf   WBC, UA >50 0 - 5 WBC/hpf   Bacteria, UA MANY (A) NONE SEEN   Squamous Epithelial / LPF 0-5 0 - 5   Mucus PRESENT   CBC with Differential/Platelet     Status: Abnormal   Collection Time: 11/24/18  5:56 PM  Result Value Ref Range   WBC 8.8 4.0 - 10.5 K/uL   RBC 4.18 3.87 - 5.11 MIL/uL   Hemoglobin 11.9 (L) 12.0 - 15.0 g/dL   HCT 16.1 09.6 - 04.5 %   MCV 87.3 80.0 - 100.0 fL   MCH 28.5 26.0 - 34.0 pg   MCHC 32.6 30.0 - 36.0 g/dL   RDW 40.9 81.1 - 91.4 %   Platelets 206 150 - 400 K/uL    nRBC 0.0 0.0 - 0.2 %   Neutrophils Relative % 85 %   Neutro Abs 7.5 1.7 - 7.7 K/uL   Lymphocytes Relative 9 %   Lymphs Abs 0.8 0.7 - 4.0 K/uL   Monocytes Relative 6 %   Monocytes Absolute 0.5 0.1 - 1.0 K/uL   Eosinophils Relative 0 %   Eosinophils Absolute 0.0 0.0 - 0.5 K/uL   Basophils Relative 0 %   Basophils Absolute 0.0 0.0 - 0.1 K/uL  Lactic acid, plasma     Status: None   Collection Time: 11/24/18  5:56 PM  Result Value Ref Range   Lactic Acid, Venous  1.1 0.5 - 1.9 mmol/L  Comprehensive metabolic panel     Status: Abnormal   Collection Time: 11/24/18  5:56 PM  Result Value Ref Range   Sodium 137 135 - 145 mmol/L   Potassium 3.3 (L) 3.5 - 5.1 mmol/L   Chloride 105 98 - 111 mmol/L   CO2 23 22 - 32 mmol/L   Glucose, Bld 95 70 - 99 mg/dL   BUN 9 6 - 20 mg/dL   Creatinine, Ser 6.041.02 (H) 0.44 - 1.00 mg/dL   Calcium 8.3 (L) 8.9 - 10.3 mg/dL   Total Protein 7.1 6.5 - 8.1 g/dL   Albumin 3.4 (L) 3.5 - 5.0 g/dL   AST 17 15 - 41 U/L   ALT 12 0 - 44 U/L   Alkaline Phosphatase 80 38 - 126 U/L   Total Bilirubin 0.7 0.3 - 1.2 mg/dL   GFR calc non Af Amer >60 >60 mL/min   GFR calc Af Amer >60 >60 mL/min   Anion gap 9 5 - 15   MAU Course  Procedures Orders Placed This Encounter  Procedures  . CBC with Differential/Platelet    Standing Status:   Standing    Number of Occurrences:   1  . Urinalysis, Routine w reflex microscopic    Standing Status:   Standing    Number of Occurrences:   1  . Comprehensive metabolic panel    Standing Status:   Standing    Number of Occurrences:   1  . Protein / creatinine ratio, urine    Standing Status:   Standing    Number of Occurrences:   1  . Urinalysis, Microscopic (reflex)    Standing Status:   Standing    Number of Occurrences:   1  . In and Out Cath    Standing Status:   Standing    Number of Occurrences:   1  . Discharge patient    Order Specific Question:   Discharge disposition    Answer:   01-Home or Self Care [1]    Order  Specific Question:   Discharge patient date    Answer:   11/24/2018   Meds ordered this encounter  Medications  . acetaminophen (TYLENOL) tablet 650 mg  . lactated ringers bolus 1,000 mL  . dexamethasone (DECADRON) injection 10 mg  . diphenhydrAMINE (BENADRYL) injection 25 mg  . metoCLOPramide (REGLAN) injection 10 mg  . DISCONTD: lactated ringers bolus 1,000 mL  . cefTRIAXone (ROCEPHIN) 2 g in sodium chloride 0.9 % 100 mL IVPB    Order Specific Question:   Antibiotic Indication:    Answer:   UTI  . 0.9 %  sodium chloride infusion  . cephALEXin (KEFLEX) 500 MG capsule    Sig: Take 1 capsule (500 mg total) by mouth 2 (two) times daily for 7 days.    Dispense:  14 capsule    Refill:  0    Order Specific Question:   Supervising Provider    Answer:   Jaynie CollinsANYANWU, UGONNA A [[id number]]   MDM Labs ordered and reviewed. HA resolved after meds, pt feeling better, fever coming down. Tolerating po. Consult with Dr. Despina HiddenEure, will treat for presumed UTI. New onset HTN, no severe range BPs. Will follow outpt. Stable for discharge home.   Assessment and Plan   1. Acute cystitis with hematuria   2. Acute non intractable tension-type headache   3. Postpartum hypertension    [facility]  Rx Keflex  Allergies as of 11/24/2018   No Known Allergies     Medication List    TAKE these medications   cephALEXin 500 MG capsule Commonly known as:  KEFLEX Take 1 capsule (500 mg total) by mouth 2 (two) times daily for 7 days.   ibuprofen 600 MG tablet Commonly known as:  ADVIL,MOTRIN Take 1 tablet (600 mg total) by mouth every 6 (six) hours.   senna-docusate 8.6-50 MG tablet Commonly known as:  Senokot-S Take 2 tablets by mouth daily.      Donette LarryMelanie Raynor Calcaterra, CNM 11/24/2018, 9:03 PM

## 2018-11-26 ENCOUNTER — Ambulatory Visit: Payer: Medicaid Other

## 2018-11-26 VITALS — BP 126/89 | HR 93

## 2018-11-26 DIAGNOSIS — Z0130 Encounter for examination of blood pressure without abnormal findings: Secondary | ICD-10-CM

## 2018-11-26 NOTE — Progress Notes (Signed)
I have reviewed the chart and agree with nursing staff's documentation of this patient's encounter.  Catalina Antigua, MD 11/26/2018 10:55 AM

## 2018-11-26 NOTE — Progress Notes (Signed)
..  Subjective:  Katelyn Mcfarland is a 28 y.o. female here for BP check.   Hypertension ROS: taking medications as instructed, no medication side effects noted, no TIA's, no chest pain on exertion, no dyspnea on exertion and no swelling of ankles.    Objective:  BP 126/89   Pulse 93   Appearance alert, well appearing, and in no distress. General exam BP noted to be well controlled today in office.    Assessment:   Blood Pressure improved.   Plan:  Current treatment plan is effective, no change in therapy..  Patient scored 12 on depression screening, offered referral, pt declined and stated that Home Nurse gave her a referral and she is going to try those places. Advised pt to keep taking amlodipine for BP, pp visit is scheduled for 12-13-18.

## 2018-12-13 ENCOUNTER — Ambulatory Visit (INDEPENDENT_AMBULATORY_CARE_PROVIDER_SITE_OTHER): Payer: Medicaid Other | Admitting: Obstetrics and Gynecology

## 2018-12-13 ENCOUNTER — Encounter: Payer: Self-pay | Admitting: Obstetrics and Gynecology

## 2018-12-13 DIAGNOSIS — Z1389 Encounter for screening for other disorder: Secondary | ICD-10-CM

## 2018-12-13 DIAGNOSIS — Z3482 Encounter for supervision of other normal pregnancy, second trimester: Secondary | ICD-10-CM

## 2018-12-13 NOTE — Progress Notes (Signed)
Post Partum Exam  Katelyn Mcfarland is a 28 y.o. [phone] female who presents for a postpartum visit. She is 4 weeks postpartum following a spontaneous vaginal delivery. I have fully reviewed the prenatal and intrapartum course. The delivery was at 40 gestational weeks.  Anesthesia: none. Postpartum course has been doing well. Baby's course has been doing well. Baby is feeding by both breast and bottle - [employer] Good Start. Bleeding thin lochia. Bowel function is normal. Bladder function is normal. Patient is not sexually active. Contraception method is tubal ligation.  Postpartum depression screening:neg, score 5. Patient is scheduled with [facility] for family counseling. She is receiving ample assistance from her mother, sister and FOB. Patient reports a history of depression, currently not on any medication.    Last pap smear done 04/2018 and was Normal  Review of Systems Pertinent items are noted in HPI.    Objective:  Blood pressure 114/80, pulse (!) 106, weight 188 lb (85.3 kg), unknown if currently breastfeeding.  General:  alert, cooperative and no distress   Breasts:  inspection negative, no nipple discharge or bleeding, no masses or nodularity palpable  Lungs: clear to auscultation bilaterally  Heart:  regular rate and rhythm, S1, S2 normal, no murmur, click, rub or gallop  Abdomen: soft, non-tender; bowel sounds normal; no masses,  no organomegaly   Vulva:  normal  Vagina: normal vagina, no discharge, exudate, lesion, or erythema  Cervix:  multiparous appearance  Corpus: normal size, contour, position, consistency, mobility, non-tender  Adnexa:  normal adnexa and no mass, fullness, tenderness  Rectal Exam: Not performed.        Assessment:    Normal postpartum exam. Pap smear not done at today's visit.   Plan:   1. Contraception: tubal ligation 2. Patient is medically cleared to resume all activities of daily living. Patient desires to return to work ASAP 3. Follow up in: 6  months or as needed.

## 2018-12-15 ENCOUNTER — Telehealth: Payer: Self-pay

## 2018-12-15 NOTE — Telephone Encounter (Signed)
Called pt to confirm dates for return to work paperwork. Pt also stated that she has been having cold symptoms and wanted to know what is safe to take while breastfeeding. Consulted with provider and notified pt.

## 2019-05-13 ENCOUNTER — Encounter: Payer: Self-pay | Admitting: Family Medicine

## 2019-05-13 ENCOUNTER — Other Ambulatory Visit (HOSPITAL_COMMUNITY)
Admission: RE | Admit: 2019-05-13 | Discharge: 2019-05-13 | Disposition: A | Discharge status: Home / Self Care | Payer: Medicaid Other | Source: Ambulatory Visit | Attending: Emergency Medicine | Admitting: Emergency Medicine

## 2019-05-13 ENCOUNTER — Other Ambulatory Visit: Payer: Self-pay

## 2019-05-13 ENCOUNTER — Ambulatory Visit (INDEPENDENT_AMBULATORY_CARE_PROVIDER_SITE_OTHER): Payer: Medicaid Other | Admitting: Family Medicine

## 2019-05-13 VITALS — BP 112/80 | HR 93 | Ht 59.0 in | Wt 206.1 lb

## 2019-05-13 DIAGNOSIS — R1084 Generalized abdominal pain: Secondary | ICD-10-CM | POA: Diagnosis not present

## 2019-05-13 DIAGNOSIS — Z113 Encounter for screening for infections with a predominantly sexual mode of transmission: Secondary | ICD-10-CM

## 2019-05-13 DIAGNOSIS — R110 Nausea: Secondary | ICD-10-CM

## 2019-05-13 MED ORDER — ONDANSETRON HCL 4 MG PO TABS: 4.0000 mg | ORAL_TABLET | Freq: Three times a day (TID) | ORAL | 0 refills | Status: DC | PRN

## 2019-05-13 MED ORDER — ONDANSETRON 4 MG PO TBDP
4.0000 mg | ORAL_TABLET | Freq: Once | ORAL | Status: AC
  Administered 2019-05-13: 4 mg via ORAL

## 2019-05-13 NOTE — Patient Instructions (Addendum)
Thank you for coming to see me today. It was a pleasure! Today we talked about:   Your abdominal pain could be due to some of the screens that we have performed today.  I will release these results on my chart.  I have sent an medication to your pharmacy for your nausea.  If you have continued abdominal pain then please schedule an appointment with me sooner rather than later.  We have done some labs today in order to check to see what is going on.  If your pain becomes much worse or you start experiencing vomiting or fevers do not hesitate to go to the emergency room of the weekend.  You may call 1-800-QUIT-NOW. You may schedule an appointment with Dr. Valentina Lucks for smoking cessation.   Please follow-up with me in 4 weeks or sooner as needed.  If you have any questions or concerns, please do not hesitate to call the office at [phone].  Take Care,   Katelyn Lillymae Duet, DO  Abdominal Pain, Adult  Many things can cause belly (abdominal) pain. Most times, belly pain is not dangerous. Many cases of belly pain can be watched and treated at home. Sometimes belly pain is serious, though. Your doctor will try to find the cause of your belly pain. Follow these instructions at home:  Take over-the-counter and prescription medicines only as told by your doctor. Do not take medicines that help you poop (laxatives) unless told to by your doctor.  Drink enough fluid to keep your pee (urine) clear or pale yellow.  Watch your belly pain for any changes.  Keep all follow-up visits as told by your doctor. This is important. Contact a doctor if:  Your belly pain changes or gets worse.  You are not hungry, or you lose weight without trying.  You are having trouble pooping (constipated) or have watery poop (diarrhea) for more than 2-3 days.  You have pain when you pee or poop.  Your belly pain wakes you up at night.  Your pain gets worse with meals, after eating, or with certain foods.  You are  throwing up and cannot keep anything down.  You have a fever. Get help right away if:  Your pain does not go away as soon as your doctor says it should.  You cannot stop throwing up.  Your pain is only in areas of your belly, such as the right side or the left lower part of the belly.  You have bloody or black poop, or poop that looks like tar.  You have very bad pain, cramping, or bloating in your belly.  You have signs of not having enough fluid or water in your body (dehydration), such as: ? Dark pee, very little pee, or no pee. ? Cracked lips. ? Dry mouth. ? Sunken eyes. ? Sleepiness. ? Weakness. This information is not intended to replace advice given to you by your health care provider. Make sure you discuss any questions you have with your health care provider. Document Released: 03/17/2008 Document Revised: 04/18/2016 Document Reviewed: 03/12/2016 Elsevier Interactive Patient Education  [employer].

## 2019-05-13 NOTE — Progress Notes (Signed)
Subjective:    Patient ID: Katelyn Mcfarland, female    DOB: 12/03/1990, 28 y.o.   MRN: [id number]   CC: establishing care  HPI:  Abdominal pain for 3 weeks nauseas and vomiting 4 times in 4 weeks No one else has been sick  No diarrhea or constipation No fevers or chills, sharp pain in upper part of stomach Has tried advil and ibuprofen No blood in stool or dark stools never had anything like this before  Cycle spotted 2 weeks ago and is normally. Vaginal odor for 3 or 4 days, no discharge,  Tubes tied 11/2018 Would like to be tested for STI's  Review of Systems  Constitutional: Negative for chills, fever and weight loss.  Eyes: Negative.   Respiratory: Negative.   Cardiovascular: Negative.   Gastrointestinal: Positive for abdominal pain. Negative for blood in stool, constipation, diarrhea, melena, nausea and vomiting.  Genitourinary: Negative for dysuria and urgency.  Musculoskeletal: Negative.   Skin: Negative.   Neurological: Negative.   Endo/Heme/Allergies: Negative.   Psychiatric/Behavioral: Negative.      Patient Active Problem List   Diagnosis Date Noted  . Abdominal pain 05/19/2019  . Sickle cell trait (HCC) 05/01/2018  . BMI 35-39 05/01/2018  . Current smoker 01/11/2014     Family History  Problem Relation Age of Onset  . Hypertension Mother   . Diabetes Mother   . Asthma Sister   . Cancer Paternal Grandmother   . Cancer Paternal Grandfather   . Cancer Maternal Grandmother   . Kidney disease Maternal Grandmother   . Diabetes Maternal Grandmother   . Heart disease Neg Hx   . Stroke Neg Hx     Past Medical History:  Diagnosis Date  . Allergy   . Chlamydia    age 28yo; hx/o trichomonas age 28yo  . Chronic headache   . Depression    doing ok, declined meds  . High cholesterol   . Infection    UTI  . Vaginal Pap smear, abnormal    bx, cryo    Social Hx: Denies tobacco use, alcohol use, or illicit drug use.  Objective:  BP 112/80    Pulse 93   Ht 4\' 11"  (1.499 m)   Wt 206 lb 2 oz (93.5 kg)   LMP 04/29/2019   SpO2 98%   BMI 41.63 kg/m  Vitals and nursing note reviewed  General: NAD, pleasant Head: Atraumatic Neck: Supple Cardiac: RRR, normal heart sounds, no murmurs Respiratory: CTAB, normal effort Abdomen: soft, nontender, nondistended. Bowel sounds present GU/GYN: External genitalia within normal limits.  Vaginal mucosa pink, moist, normal rugae.  Nonfriable cervix without lesions, no discharge or bleeding noted on speculum exam.  Bimanual exam revealed normal, nongravid uterus.  No cervical motion tenderness. No adnexal masses bilaterally. Exam performed in the presence of a chaperone. Extremities: no edema or cyanosis. WWP. MSK: normal gait Skin: warm and dry, no rashes noted Neuro: alert and oriented, no focal deficits Psych: Neatly groomed and appropriately dressed. Maintains good eye contact and is cooperative and attentive. Speech is normal volume and rate. Denies SI/ HI. Normal affect.  Assessment & Plan:    Abdominal pain Patient reports on and off abdominal pain for 3 weeks with on and off nausea and vomiting.  Unclear etiology.  Vital signs all stable and patient overall well-appearing. Pregnancy test negative.  CMP, CBC, lipase all WNL which is reassuring.  Patient not exquisitely tender on exam.  STI testing also negative.  Patient given Zofran  for nausea and instructed to return to clinic if she continues to have this abdominal pain for further evaluation.  Health maintenance STI testing performed today all negative.  Patient also had Pap smear performed which was WNL.   [place] Royalty Domagala, DO Family Medicine Resident, PGY-3

## 2019-05-14 LAB — HIV ANTIBODY (ROUTINE TESTING W REFLEX): HIV Screen 4th Generation wRfx: NONREACTIVE

## 2019-05-14 LAB — CBC
Hematocrit: 42 % (ref 34.0–46.6)
Hemoglobin: 13.5 g/dL (ref 11.1–15.9)
MCH: 28.4 pg (ref 26.6–33.0)
MCHC: 32.1 g/dL (ref 31.5–35.7)
MCV: 88 fL (ref 79–97)
Platelets: 218 10*3/uL (ref 150–450)
RBC: 4.76 x10E6/uL (ref 3.77–5.28)
RDW: 14.6 % (ref 11.7–15.4)
WBC: 6.2 10*3/uL (ref 3.4–10.8)

## 2019-05-14 LAB — COMPREHENSIVE METABOLIC PANEL
ALT: 14 IU/L (ref 0–32)
AST: 21 IU/L (ref 0–40)
Albumin/Globulin Ratio: 1.6 (ref 1.2–2.2)
Albumin: 4.6 g/dL (ref 3.9–5.0)
Alkaline Phosphatase: 64 IU/L (ref 39–117)
BUN/Creatinine Ratio: 12 (ref 9–23)
BUN: 11 mg/dL (ref 6–20)
Bilirubin Total: 0.2 mg/dL (ref 0.0–1.2)
CO2: 22 mmol/L (ref 20–29)
Calcium: 9.4 mg/dL (ref 8.7–10.2)
Chloride: 104 mmol/L (ref 96–106)
Creatinine, Ser: 0.91 mg/dL (ref 0.57–1.00)
GFR calc Af Amer: 98 mL/min/{1.73_m2} (ref >59)
GFR calc non Af Amer: 85 mL/min/{1.73_m2} (ref >59)
Globulin, Total: 2.9 g/dL (ref 1.5–4.5)
Glucose: 82 mg/dL (ref 65–99)
Potassium: 4.5 mmol/L (ref 3.5–5.2)
Sodium: 140 mmol/L (ref 134–144)
Total Protein: 7.5 g/dL (ref 6.0–8.5)

## 2019-05-14 LAB — RPR: RPR Ser Ql: NONREACTIVE

## 2019-05-14 LAB — LIPASE: Lipase: 27 U/L (ref 14–72)

## 2019-05-14 LAB — BETA HCG QUANT (REF LAB): hCG Quant: <1 m[IU]/mL

## 2019-05-14 LAB — HEPATITIS C ANTIBODY: Hep C Virus Ab: <0.1 s/co ratio (ref 0.0–0.9)

## 2019-05-17 ENCOUNTER — Ambulatory Visit: Payer: Medicaid Other | Admitting: Pharmacist

## 2019-05-17 ENCOUNTER — Other Ambulatory Visit: Payer: Self-pay

## 2019-05-17 ENCOUNTER — Encounter: Payer: Self-pay | Admitting: Pharmacist

## 2019-05-17 DIAGNOSIS — F17200 Nicotine dependence, unspecified, uncomplicated: Secondary | ICD-10-CM | POA: Diagnosis not present

## 2019-05-17 MED ORDER — NORTRIPTYLINE HCL 25 MG PO CAPS: 25.0000 mg | ORAL_CAPSULE | Freq: Every day | ORAL | 4 refills | Status: DC

## 2019-05-17 MED ORDER — NICOTINE 21 MG/24HR TD PT24: 21.0000 mg | MEDICATED_PATCH | Freq: Every day | TRANSDERMAL | 1 refills | Status: DC

## 2019-05-17 NOTE — Progress Notes (Signed)
   S:  Patient arrives in good spirits, ambulating without assistance.   Patient arrives for evaluation/assistance with tobacco dependence.   Patient was referred and last seen by Primary Care Provider, Dr. Enid Derry on 7/31.   Age when started using tobacco on a daily basis 10. Brand smoked [place]. Number of cigarettes/day: 20 . Estimated nicotine content per cigarette (mg) 1.2.  Estimated nicotine intake per day  ~ 22mg .   Denies waking to smoke.  Most recent quit attempt: During pregnancy ~ 6 months ago. Longest time ever been tobacco free 9 months.  Medications used in past cessation efforts include: NRT (patches, gum, lozenenges), varenicline  Rates IMPORTANCE of quitting tobacco on 1-10 scale of 10. Rates CONFIDENCE of quitting tobacco on 1-10 scale of 10.  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  -Initiated nicotine replacement tx with 21mg  nicotine patch  Patient counseled on purpose, proper use, and potential adverse effects, including insomnia, vivid dreams, nightmares and also patch adhesion management.  Patient counseled on purpose, proper use, and potential adverse effects, including GI upset, and potential change in mood.  Initiated nortriptyline 25mg  once daily at night for assistance with cessation and potentially migraine frequency reduction.  Patient educated on purpose, proper use and potential adverse effects of sedation.  Following instruction patient verbalized understanding of treatment plan.    Written information provided.  F/U phone call 2 days.  F/U Rx Clinic Visit  3-4 weeks .  Total time in face-to-face counseling 30 minutes.  Patient seen with Murlean Iba, PharmD Candidate  and Lorel Monaco, PharmD PGY-1 Resident.  Marland Kitchen

## 2019-05-17 NOTE — Patient Instructions (Addendum)
Great to meet you today.  Quitting smoking is very exciting.  Start Nortriptyline 25mg  once daily at night.  Start Nicotine 21mg  patches - apply one each morning and wear it until the next morning.   We will plan to call you in two days.

## 2019-05-17 NOTE — Assessment & Plan Note (Signed)
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  -Initiated nicotine replacement tx with 21mg  nicotine patch  Patient counseled on purpose, proper use, and potential adverse effects, including insomnia, vivid dreams, nightmares and also patch adhesion management.  Patient counseled on purpose, proper use, and potential adverse effects, including GI upset, and potential change in mood.  Initiated nortriptyline 25mg  once daily at night for assistance with cessation and potentially migraine frequency reduction.  Patient educated on purpose, proper use and potential adverse effects of sedation.  Following instruction patient verbalized understanding of treatment plan.

## 2019-05-17 NOTE — Progress Notes (Signed)
Patient ID: Katelyn Mcfarland, female   DOB: [birth year], 28 y.o.   MRN: [id number]  Reviewed: Agree with Dr. Graylin Shiver documentation and management.

## 2019-05-18 LAB — CYTOLOGY - PAP
Chlamydia: NEGATIVE
Diagnosis: NEGATIVE
Neisseria Gonorrhea: NEGATIVE
Trichomonas: NEGATIVE

## 2019-05-19 ENCOUNTER — Telehealth: Payer: Self-pay | Admitting: Pharmacist

## 2019-05-19 DIAGNOSIS — R109 Unspecified abdominal pain: Secondary | ICD-10-CM | POA: Insufficient documentation

## 2019-05-19 NOTE — Telephone Encounter (Signed)
Phone call Follow Up RE tobacco cessation  With # : [phone]  States she has picked up and has taken nortriptyline 25mg  without adverse effect.   States picked up patches today and plan to apply and attempt quit tomorrow (8/7)  Plan phone f/u tomorrow PM

## 2019-05-19 NOTE — Telephone Encounter (Signed)
-----   Message from Leavy Cella, [facility] : [facility] sent at 05/17/2019  3:56 PM EDT ----- Regarding: Tobacco Cessation F/U

## 2019-05-19 NOTE — Assessment & Plan Note (Signed)
Patient reports on and off abdominal pain for 3 weeks with on and off nausea and vomiting.  Unclear etiology.  Vital signs all stable and patient overall well-appearing. Pregnancy test negative.  CMP, CBC, lipase all WNL which is reassuring.  Patient not exquisitely tender on exam.  STI testing also negative.  Patient given Zofran for nausea and instructed to return to clinic if she continues to have this abdominal pain for further evaluation.

## 2019-05-20 ENCOUNTER — Telehealth: Payer: Self-pay | Admitting: Pharmacist

## 2019-05-20 NOTE — Telephone Encounter (Signed)
F/U attempt RE tobacco cessation.    Left message - I will plan to return call in 3 days (Monday)

## 2019-05-20 NOTE — Telephone Encounter (Signed)
-----   Message from Leavy Cella, [facility] sent at 05/19/2019  1:58 PM EDT ----- Regarding: tobacco cessation

## 2019-05-23 ENCOUNTER — Telehealth: Payer: Self-pay | Admitting: Pharmacist

## 2019-05-23 NOTE — Telephone Encounter (Signed)
Attempted phone call X3 - Plan follow up again tomorrow RE tobacco cessation.

## 2019-05-23 NOTE — Telephone Encounter (Signed)
-----   Message from Leavy Cella, [facility] sent at 05/20/2019  3:34 PM EDT ----- Regarding: tobacco cessation

## 2019-05-26 ENCOUNTER — Telehealth: Payer: Self-pay | Admitting: Pharmacist

## 2019-05-26 NOTE — Telephone Encounter (Signed)
-----   Message from Leavy Cella, [facility] sent at 05/23/2019 11:33 AM EDT ----- Regarding: Tobacco Cessation

## 2019-05-26 NOTE — Telephone Encounter (Signed)
Phone call Follow Up RE tobacco cessation  With # : [phone]  No answer - left message requesting f/u return call.    Plan to follow up next week.

## 2019-06-02 ENCOUNTER — Telehealth: Payer: Self-pay | Admitting: Pharmacist

## 2019-06-02 DIAGNOSIS — F17200 Nicotine dependence, unspecified, uncomplicated: Secondary | ICD-10-CM

## 2019-06-02 MED ORDER — NICOTINE 21 MG/24HR TD PT24: 21.0000 mg | MEDICATED_PATCH | Freq: Every day | TRANSDERMAL | 1 refills | Status: DC

## 2019-06-02 NOTE — Telephone Encounter (Signed)
Patient contacted for follow-up of tobacco cessation attempt.    She reports quitting and NO SMOKING for 6 days.   She denies any side effect with use of patch.  However, she continues to report difficulty falling asleep and has been awake from 10:30 PM until 3:00 AM some nights.    Advised to take patch off ~ 30 minutes prior to bedtime AND apply new patch each AM.   Reevaluate in 1 week.

## 2019-06-02 NOTE — Telephone Encounter (Signed)
Noted and agree. 

## 2019-06-02 NOTE — Telephone Encounter (Signed)
-----   Message from Leavy Cella, [facility] sent at 05/30/2019 10:05 AM EDT ----- Regarding: tobacco cessation

## 2019-06-06 ENCOUNTER — Telehealth: Payer: Self-pay | Admitting: *Deleted

## 2019-06-06 NOTE — Telephone Encounter (Signed)
Pt is still having vomiting / nausea but denies worse abdominal pain.   She was running a fever last night.  She vomited back up her zofran last night.  VIRTUAL appt made for tomorrow AM, but advised to go to ED if symptoms worsen.  To PCP and Dr. Criss Rosales.

## 2019-06-07 ENCOUNTER — Encounter: Payer: Self-pay | Admitting: Family Medicine

## 2019-06-07 ENCOUNTER — Other Ambulatory Visit: Payer: Self-pay

## 2019-06-07 ENCOUNTER — Telehealth (INDEPENDENT_AMBULATORY_CARE_PROVIDER_SITE_OTHER): Payer: Medicaid Other | Admitting: Family Medicine

## 2019-06-07 VITALS — Wt 208.0 lb

## 2019-06-07 DIAGNOSIS — Z20822 Contact with and (suspected) exposure to covid-19: Secondary | ICD-10-CM | POA: Insufficient documentation

## 2019-06-07 DIAGNOSIS — F32A Depression, unspecified: Secondary | ICD-10-CM | POA: Insufficient documentation

## 2019-06-07 DIAGNOSIS — F329 Major depressive disorder, single episode, unspecified: Secondary | ICD-10-CM

## 2019-06-07 DIAGNOSIS — R1110 Vomiting, unspecified: Secondary | ICD-10-CM | POA: Diagnosis not present

## 2019-06-07 DIAGNOSIS — R6889 Other general symptoms and signs: Secondary | ICD-10-CM | POA: Diagnosis present

## 2019-06-07 DIAGNOSIS — R110 Nausea: Secondary | ICD-10-CM | POA: Insufficient documentation

## 2019-06-07 NOTE — Assessment & Plan Note (Signed)
Chronic for years, patient is safe and denies SI/HI but says that covid has made this worse and she feels overwhelmed.  Used to follow with [facility] but was lost to followup and prefers to go elsewhere.  We discussed possible mood contribution to abdominal symptoms  Will put in referral to psych

## 2019-06-07 NOTE — Progress Notes (Signed)
Virtual Visit via Video Note  I connected with Katelyn Mcfarland on 06/07/19 at 10:50 AM EDT by a video enabled telemedicine application and verified that I am speaking with the correct person using two identifiers.  Location: Patient: at home Provider: [facility] clinic   I discussed the limitations of evaluation and management by telemedicine and the availability of in person appointments. The patient expressed understanding and agreed to proceed.  History of Present Illness: Months worth of recurring 1x/wk of vomiting.  She is completely able to handle PO intake otherwise.   Observations/Objective:   Assessment and Plan: Suspected Covid-19 Virus Infection Patient with fever symptoms for last few days, highest objective readin 100.1.  Has had some abd pain and vomiting but that is chronic >89yr, 1 day per week, with good PO intake other 6days/wk.  No hematemesis.  Ordered drive-thru testing, instructed to isolate as though positive until results back  Depression Chronic for years, patient is safe and denies SI/HI but says that covid has made this worse and she feels overwhelmed.  [facility] to [facility] but was lost to followup and prefers to go elsewhere.  We discussed possible mood contribution to abdominal symptoms  Will put in referral to psych  Chronic vomiting Patient describes 1x vomiting per week with occasional abd pain but no symptoms other 6 days per week.  No objective diagnosis yet confirmed, patient taking good PO and does not think she needs hospital right now.   Discussed emergency return precautions and outpatient referral to GI, with mention that these symptoms might have emotional etiology and not structural problem.  Patient admits prior marijuana use but stopped "awhile ago"    Follow Up Instructions:    I discussed the assessment and treatment plan with the patient. The patient was provided an opportunity to ask questions and all were answered. The patient  agreed with the plan and demonstrated an understanding of the instructions.   The patient was advised to call back or seek an in-person evaluation if the symptoms worsen or if the condition fails to improve as anticipated.  I provided 15 minutes of non-face-to-face time during this encounter.   Sherene Sires, DO

## 2019-06-07 NOTE — Assessment & Plan Note (Signed)
Patient describes 1x vomiting per week with occasional abd pain but no symptoms other 6 days per week.  No objective diagnosis yet confirmed, patient taking good PO and does not think she needs hospital right now.   Discussed emergency return precautions and outpatient referral to GI, with mention that these symptoms might have emotional etiology and not structural problem.  Patient admits prior marijuana use but stopped "awhile ago"

## 2019-06-07 NOTE — Assessment & Plan Note (Signed)
Patient with fever symptoms for last few days, highest objective readin 100.1.  Has had some abd pain and vomiting but that is chronic >49yr, 1 day per week, with good PO intake other 6days/wk.  No hematemesis.  Ordered drive-thru testing, instructed to isolate as though positive until results back

## 2019-06-08 ENCOUNTER — Telehealth: Payer: Medicaid Other | Admitting: Family Medicine

## 2019-06-08 LAB — NOVEL CORONAVIRUS, NAA: SARS-CoV-2, NAA: NOT DETECTED

## 2019-06-09 ENCOUNTER — Telehealth: Payer: Self-pay | Admitting: Pharmacist

## 2019-06-09 NOTE — Telephone Encounter (Signed)
Contacted for Tobacco cessation.   Reports quitting for 13-14 days.  Removed the patch ~ 5 days ago due to " feeling bad"/sick and has not restarted the patch.  She states she does not feel like she will return to smoking.   Patient was encouraged with cessation efforts.  Agreed to contact her again in 2-3 weeks for support.

## 2019-06-09 NOTE — Telephone Encounter (Deleted)
-----   Message from Leavy Cella, [facility] sent at 06/02/2019  2:38 PM EDT ----- Regarding: tobacco cessation with patch (insomnia)

## 2019-06-09 NOTE — Telephone Encounter (Signed)
Noted and agree. 

## 2019-06-17 ENCOUNTER — Encounter: Payer: Self-pay | Admitting: Physician Assistant

## 2019-06-17 ENCOUNTER — Telehealth: Payer: Self-pay

## 2019-06-17 DIAGNOSIS — Q159 Congenital malformation of eye, unspecified: Secondary | ICD-10-CM

## 2019-06-17 NOTE — Telephone Encounter (Signed)
Patient calls nurse line requesting a referral to an eye doctor so her insurance will cover. Please advise.

## 2019-06-28 ENCOUNTER — Encounter: Payer: Self-pay | Admitting: Physician Assistant

## 2019-06-28 ENCOUNTER — Ambulatory Visit: Payer: Medicaid Other | Admitting: Physician Assistant

## 2019-06-28 VITALS — BP 122/72 | HR 93 | Temp 98.4°F | Ht 59.0 in | Wt 215.0 lb

## 2019-06-28 DIAGNOSIS — R112 Nausea with vomiting, unspecified: Secondary | ICD-10-CM

## 2019-06-28 DIAGNOSIS — R194 Change in bowel habit: Secondary | ICD-10-CM | POA: Diagnosis not present

## 2019-06-28 DIAGNOSIS — K625 Hemorrhage of anus and rectum: Secondary | ICD-10-CM

## 2019-06-28 DIAGNOSIS — R109 Unspecified abdominal pain: Secondary | ICD-10-CM | POA: Diagnosis not present

## 2019-06-28 DIAGNOSIS — K219 Gastro-esophageal reflux disease without esophagitis: Secondary | ICD-10-CM

## 2019-06-28 MED ORDER — OMEPRAZOLE 40 MG PO CPDR: 40.0000 mg | DELAYED_RELEASE_CAPSULE | Freq: Every day | ORAL | 3 refills | Status: DC

## 2019-06-28 MED ORDER — SUPREP BOWEL PREP KIT 17.5-3.13-1.6 GM/177ML PO SOLN: 1.0000 | ORAL | 0 refills | Status: DC

## 2019-06-28 NOTE — Progress Notes (Signed)
____________________________________________________________  Attending physician addendum:  Thank you for sending this case to me. I have reviewed the entire note, and the outlined plan seems appropriate.  Endoscopic workup reasonable for symptoms as described.  If unrevealing, it does sounds like a functional bowel disorder.  Wilfrid Lund, MD  ____________________________________________________________

## 2019-06-28 NOTE — Patient Instructions (Signed)
If you are age 30 or younger, your body mass index should be between 19-25. Your Body mass index is 43.42 kg/m. If this is out of the aformentioned range listed, please consider follow up with your Primary Care Provider.  You have been scheduled for an endoscopy and colonoscopy. Please follow the written instructions given to you at your visit today. Please pick up your prep supplies at the pharmacy within the next 1-3 days. If you use inhalers (even only as needed), please bring them with you on the day of your procedure.  We have sent the following medications to your pharmacy for you to pick up at your convenience: Suprep and Omeprazole   Thank you for choosing me and [facility].  Dennison Bulla

## 2019-06-28 NOTE — Progress Notes (Signed)
Chief Complaint: Chronic vomiting, change in bowel habits, abdominal pain, rectal bleeding  HPI:    Katelyn Mcfarland is a 28 year old African-American female with past medical history as listed below, who was referred to me by Zenia Resides, MD for a complaint of chronic vomiting, change in bowel habits, abdominal pain, rectal bleeding.      05/13/2019 pregnancy test negative.  CBC normal.  Lipase normal.  CMP normal.    06/07/2019 telehealth visit with her PCP.  Discussed vomiting 1 time a week in the recent past.  Tested for COVID 3 weeks ago.  Negative.    Today, the patient reports that for the past 2 years off-and-on she has had various GI complaints.  Tells me that she has a "sensitive stomach" and anything she eats or medicine she takes seem to bother her.  Describes that at first she was going to the doctor and they would diagnose her with a "stomach infection or virus or UTI" and gave her antibiotics but this never went away.  Tells me that she will have episodes which may last up to a few weeks at a time of generalized abdominal pain and discomfort rated as a 5-6/10 described as cramping in nature.  Along with this will experience nausea and oftentimes vomiting at least a few times that week if not every day.  Along with this her bowel movements will sometimes be constipated or other times be more frequent.  Has used Zofran some in the past which seems to help a little bit with the nausea, but sometimes she cannot even get this medication in.  Also describes occasional bright red blood in the toilet paper typically after straining for a bowel movement.  Tells me they describe to hemorrhoids after her last birth in February. Also frequent reflux.    Denies fever, chills, weight loss or symptoms that awaken her from sleep.  Past Medical History:  Diagnosis Date  . Allergy   . Chlamydia    age 71yo; hx/o trichomonas age 28yo  . Chronic headache   . Depression    doing ok, declined meds  .  High cholesterol   . Infection    UTI  . Vaginal Pap smear, abnormal    bx, cryo    Past Surgical History:  Procedure Laterality Date  . INDUCED ABORTION    . TUBAL LIGATION Bilateral 11/15/2018   Procedure: POST PARTUM TUBAL LIGATION;  Surgeon: Chancy Milroy, MD;  Location: [place];  Service: Gynecology;  Laterality: Bilateral;  . WISDOM TOOTH EXTRACTION      Current Outpatient Medications  Medication Sig Dispense Refill  . ibuprofen (ADVIL,MOTRIN) 600 MG tablet Take 1 tablet (600 mg total) by mouth every 6 (six) hours. 30 tablet 0  . Misc Natural Products (APPLE CIDER VINEGAR DIET PO) Take 2 capsules by mouth.    . nortriptyline (PAMELOR) 25 MG capsule Take 1 capsule (25 mg total) by mouth at bedtime. 30 capsule 4   No current facility-administered medications for this visit.     Allergies as of 06/28/2019  . (No Known Allergies)    Family History  Problem Relation Age of Onset  . Hypertension Mother   . Diabetes Mother   . Asthma Sister   . Cancer Paternal Grandmother   . Cancer Paternal Grandfather   . Cancer Maternal Grandmother   . Kidney disease Maternal Grandmother   . Diabetes Maternal Grandmother   . Heart disease Neg Hx   . Stroke Neg  Hx     Social History   Socioeconomic History  . Marital status: Single    Spouse name: Not on file  . Number of children: Not on file  . Years of education: Not on file  . Highest education level: Not on file  Occupational History  . Occupation: student [employer]     Employer: [employer]  . Financial resource strain: Not on file  . Food insecurity    Worry: Not on file    Inability: Not on file  . Transportation needs    Medical: Not on file    Non-medical: Not on file  Tobacco Use  . Smoking status: Current Every Day Smoker    Packs/day: 1.00    Years: 16.00    Pack years: 16.00    Types: Cigarettes    Start date: 10/13/2000  . Smokeless tobacco: Never Used  Substance and Sexual Activity   . Alcohol use: Not Currently    Comment: not currently, socially  . Drug use: Yes    Types: Marijuana    Comment: last Mar 2018  . Sexual activity: Yes  Lifestyle  . Physical activity    Days per week: Not on file    Minutes per session: Not on file  . Stress: Not on file  Relationships  . Social Herbalist on phone: Not on file    Gets together: Not on file    Attends religious service: Not on file    Active member of club or organization: Not on file    Attends meetings of clubs or organizations: Not on file    Relationship status: Not on file  . Intimate partner violence    Fear of current or ex partner: No    Emotionally abused: No    Physically abused: No    Forced sexual activity: No  Other Topics Concern  . Not on file  Social History Narrative   Single, 2 children, exercise - none.  Work - dietary aid, Engineer, [employer]    Review of Systems:    Constitutional: No weight loss, fever or chills Skin: No rash  Cardiovascular: No chest pain Respiratory: No SOB  Gastrointestinal: See HPI and otherwise negative Genitourinary: No dysuria  Neurological: No headache, dizziness or syncope Musculoskeletal: No new muscle or joint pain Hematologic: No bruising Psychiatric: No history of depression or anxiety   Physical Exam:  Vital signs: BP 122/72 (BP Location: Left Arm, Patient Position: Sitting)   Pulse 93   Temp 98.4 F (36.9 C)   Ht _0  (1.499 m)   Wt 215 lb (97.5 kg)   SpO2 98%   BMI 43.42 kg/m   Constitutional:   Pleasant Obese AA female appears to be in NAD, Well developed, Well nourished, alert and cooperative Head:  Normocephalic and atraumatic. Eyes:   PEERL, EOMI. No icterus. Conjunctiva pink. Ears:  Normal auditory acuity. Neck:  Supple Throat: Oral cavity and pharynx without inflammation, swelling or lesion.  Respiratory: Respirations even and unlabored. Lungs clear to auscultation bilaterally.   No wheezes,  crackles, or rhonchi.  Cardiovascular: Normal S1, S2. No MRG. Regular rate and rhythm. No peripheral edema, cyanosis or pallor.  Gastrointestinal:  Soft, nondistended, nontender. No rebound or guarding. Normal bowel sounds. No appreciable masses or hepatomegaly. Rectal:  Not performed.  Msk:  Symmetrical without gross deformities. Without edema, no deformity or joint abnormality.  Neurologic:  Alert and  oriented x4;  grossly normal neurologically.  Skin:   Dry and intact without significant lesions or rashes. Psychiatric: Demonstrates good judgement and reason without abnormal affect or behaviors.  MOST RECENT LABS AND IMAGING: CBC    Component Value Date/Time   WBC 6.2 05/13/2019 1652   WBC 8.8 11/24/2018 1756   RBC 4.76 05/13/2019 1652   RBC 4.18 11/24/2018 1756   HGB 13.5 05/13/2019 1652   HCT 42.0 05/13/2019 1652   PLT 218 05/13/2019 1652   MCV 88 05/13/2019 1652   MCH 28.4 05/13/2019 1652   MCH 28.5 11/24/2018 1756   MCHC 32.1 05/13/2019 1652   MCHC 32.6 11/24/2018 1756   RDW 14.6 05/13/2019 1652   LYMPHSABS 0.8 11/24/2018 1756   LYMPHSABS 1.8 04/27/2018 1114   MONOABS 0.5 11/24/2018 1756   EOSABS 0.0 11/24/2018 1756   EOSABS 0.0 04/27/2018 1114   BASOSABS 0.0 11/24/2018 1756   BASOSABS 0.0 04/27/2018 1114    CMP     Component Value Date/Time   NA 140 05/13/2019 1651   K 4.5 05/13/2019 1651   CL 104 05/13/2019 1651   CO2 22 05/13/2019 1651   GLUCOSE 82 05/13/2019 1651   GLUCOSE 95 11/24/2018 1756   BUN 11 05/13/2019 1651   CREATININE 0.91 05/13/2019 1651   CREATININE 0.77 11/01/2013 1509   CALCIUM 9.4 05/13/2019 1651   PROT 7.5 05/13/2019 1651   ALBUMIN 4.6 05/13/2019 1651   AST 21 05/13/2019 1651   ALT 14 05/13/2019 1651   ALKPHOS 64 05/13/2019 1651   BILITOT 0.2 05/13/2019 1651   GFRNONAA 85 05/13/2019 1651   GFRAA 98 05/13/2019 1651    Assessment: 1.  Generalized abdominal pain: Over the past 2 years off-and-on; consider IBS versus other 2.  Nausea  and vomiting: Consider relation to reflux and gastritis versus cyclical vomiting syndrome versus other 3.  GERD: Frequent for the patient, especially after having her last child 4.  Rectal bleeding: Occasional, typically with straining; most likely hemorrhoids but must also consider IBD versus other cause 5.  Change in bowel habits: Varying from constipation to more frequent stools over the past 2 years, with abdominal pain and occasional rectal bleeding; consider IBS versus other  Plan: 1.  Given myriad of GI symptoms over the past 2 years recommend an EGD and colonoscopy for further evaluation.  Did discuss risks, benefits, limitations and alternatives and the patient agrees to proceed.  These were scheduled with Dr. Loletha Carrow in the [facility] as he is supervising today. 2.  Prescribed Omeprazole 40 mg daily, 30 minutes before breakfast.  #30 with 3 refills. 3.  Briefly described IBS.  If EGD/colonoscopy are unrevealing we will discuss this further.  Could also obtain abdominal ultrasound versus cross-sectional imaging. 4.  Answered all the patient's questions.  She is glad to have someone doing further work-up because she has been suffering so long. 5.  Patient to follow in clinic per recommendations from Dr. Loletha Carrow after time of procedures.  Ellouise Newer, PA-C [facility] 06/28/2019, 1:37 PM  Cc: Zenia Resides, MD

## 2019-07-01 ENCOUNTER — Telehealth: Payer: Self-pay | Admitting: Pharmacist

## 2019-07-01 NOTE — Telephone Encounter (Signed)
F/U of tobacco cessation.   Reports no smoking for > 2 weeks.   States she is 10/10 that she will remain quit through the end of 2020.   I plan to follow-up 1 additional time in ~ 1 month.   She stated she appreciated the call.

## 2019-07-15 ENCOUNTER — Telehealth: Payer: Self-pay | Admitting: Gastroenterology

## 2019-07-15 NOTE — Telephone Encounter (Signed)
Pt answered No to all covid questions

## 2019-07-15 NOTE — Telephone Encounter (Signed)
°  Covid-19 Screening Questions ° °  ° °  ° °Do you now or have you had a fever in the last 14 days?     ° °  ° °Do you have any respiratory symptoms of shortness of breath or cough now or in the last 14 days?    ° °  ° °Do you have any family members or close contacts with diagnosed or suspected Covid-19 in the past 14 days?     ° °  ° °Have you been tested for Covid-19 and found to be positive?    ° °  ° °Please make pt aware that care partner may wait in the car or come up to the lobby during the procedure but will need to provide their own mask. ° °  ° °  °

## 2019-07-18 ENCOUNTER — Other Ambulatory Visit: Payer: Self-pay

## 2019-07-18 ENCOUNTER — Other Ambulatory Visit: Payer: Self-pay | Admitting: Gastroenterology

## 2019-07-18 ENCOUNTER — Ambulatory Visit (AMBULATORY_SURGERY_CENTER): Payer: Medicaid Other | Admitting: Gastroenterology

## 2019-07-18 ENCOUNTER — Encounter: Payer: Self-pay | Admitting: Gastroenterology

## 2019-07-18 VITALS — BP 146/95 | HR 87 | Temp 98.5°F | Resp 12 | Ht 59.0 in | Wt 215.0 lb

## 2019-07-18 DIAGNOSIS — R1084 Generalized abdominal pain: Secondary | ICD-10-CM

## 2019-07-18 DIAGNOSIS — K2100 Gastro-esophageal reflux disease with esophagitis, without bleeding: Secondary | ICD-10-CM

## 2019-07-18 DIAGNOSIS — R112 Nausea with vomiting, unspecified: Secondary | ICD-10-CM

## 2019-07-18 DIAGNOSIS — R194 Change in bowel habit: Secondary | ICD-10-CM | POA: Diagnosis not present

## 2019-07-18 DIAGNOSIS — K2950 Unspecified chronic gastritis without bleeding: Secondary | ICD-10-CM

## 2019-07-18 DIAGNOSIS — K219 Gastro-esophageal reflux disease without esophagitis: Secondary | ICD-10-CM

## 2019-07-18 DIAGNOSIS — R109 Unspecified abdominal pain: Secondary | ICD-10-CM

## 2019-07-18 DIAGNOSIS — K625 Hemorrhage of anus and rectum: Secondary | ICD-10-CM

## 2019-07-18 DIAGNOSIS — K449 Diaphragmatic hernia without obstruction or gangrene: Secondary | ICD-10-CM | POA: Diagnosis not present

## 2019-07-18 MED ORDER — SODIUM CHLORIDE 0.9 % IV SOLN: 500.0000 mL | Freq: Once | INTRAVENOUS | Status: DC

## 2019-07-18 NOTE — Progress Notes (Signed)
Pt's states no medical or surgical changes since previsit or office visit.  Temp by [employer] by [place] in adm

## 2019-07-18 NOTE — Op Note (Signed)
[facility] Patient Name: Katelyn Mcfarland Procedure Date: 07/18/2019 2:10 PM MRN: [id number] Endoscopist: Mallie Mussel L. Loletha Carrow , MD Age: 28 Referring MD:  Date of Birth: 01-28-1991 Gender: Female Account #: [id number] Procedure:                Colonoscopy Indications:              Generalized abdominal pain, Rectal bleeding,                            irregular bowel habits - alternating constipation                            and diarrhea Medicines:                Monitored Anesthesia Care Procedure:                Pre-Anesthesia Assessment:                           - Prior to the procedure, a History and Physical                            was performed, and patient medications and                            allergies were reviewed. The patient's tolerance of                            previous anesthesia was also reviewed. The risks                            and benefits of the procedure and the sedation                            options and risks were discussed with the patient.                            All questions were answered, and informed consent                            was obtained. Prior Anticoagulants: The patient has                            taken no previous anticoagulant or antiplatelet                            agents. ASA Grade Assessment: II - A patient with                            mild systemic disease. After reviewing the risks                            and benefits, the patient was deemed in  satisfactory condition to undergo the procedure.                           - Prior to the procedure, a History and Physical                            was performed, and patient medications and                            allergies were reviewed. The patient's tolerance of                            previous anesthesia was also reviewed. The risks                            and benefits of the procedure and the sedation                 options and risks were discussed with the patient.                            All questions were answered, and informed consent                            was obtained. Prior Anticoagulants: The patient has                            taken no previous anticoagulant or antiplatelet                            agents. ASA Grade Assessment: II - A patient with                            mild systemic disease. After reviewing the risks                            and benefits, the patient was deemed in                            satisfactory condition to undergo the procedure.                           After obtaining informed consent, the colonoscope                            was passed under direct vision. Throughout the                            procedure, the patient's blood pressure, pulse, and                            oxygen saturations were monitored continuously. The                            Colonoscope  was introduced through the anus and                            advanced to the the terminal ileum, with                            identification of the appendiceal orifice and IC                            valve. The colonoscopy was performed without                            difficulty. The patient tolerated the procedure                            well. The quality of the bowel preparation was                            good. The terminal ileum, ileocecal valve,                            appendiceal orifice, and rectum were photographed. Scope In: 2:16:40 PM Scope Out: 2:27:25 PM Scope Withdrawal Time: 0 hours 7 minutes 6 seconds  Total Procedure Duration: 0 hours 10 minutes 45 seconds  Findings:                 The perianal and digital rectal examinations were                            normal.                           The terminal ileum appeared normal.                           The entire examined colon appeared normal on direct                            and  retroflexion views. Complications:            No immediate complications. Estimated Blood Loss:     Estimated blood loss: none. Impression:               - The examined portion of the ileum was normal.                           - The entire examined colon is normal on direct and                            retroflexion views.                           - No specimens collected.                           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 Recommendation:           -  Patient has a contact number available for                            emergencies. The signs and symptoms of potential                            delayed complications were discussed with the                            patient. Return to normal activities tomorrow.                            Written discharge instructions were provided to the                            patient.                           - Resume previous diet.                           - Continue present medications.                           - See the other procedure note for documentation of                            additional recommendations.                           - No recommendation at this time regarding repeat                            colonoscopy due to young age. Borden Thune L. Myrtie Neither, MD 07/18/2019 2:46:11 PM This report has been signed electronically.

## 2019-07-18 NOTE — Op Note (Signed)
[name] Patient Name: Katelyn Mcfarland Procedure Date: 07/18/2019 2:09 PM MRN: [id number] Endoscopist: Mallie Mussel L. Loletha Carrow , MD Age: 28 Referring MD:  Date of Birth: 1991/07/21 Gender: Female Account #: [id number] Procedure:                Upper GI endoscopy Indications:              Generalized abdominal pain, Nausea with vomiting Medicines:                Monitored Anesthesia Care Procedure:                Pre-Anesthesia Assessment:                           - Prior to the procedure, a History and Physical                            was performed, and patient medications and                            allergies were reviewed. The patient's tolerance of                            previous anesthesia was also reviewed. The risks                            and benefits of the procedure and the sedation                            options and risks were discussed with the patient.                            All questions were answered, and informed consent                            was obtained. Prior Anticoagulants: The patient has                            taken no previous anticoagulant or antiplatelet                            agents. ASA Grade Assessment: II - A patient with                            mild systemic disease. After reviewing the risks                            and benefits, the patient was deemed in                            satisfactory condition to undergo the procedure.                           After obtaining informed consent, the endoscope was  passed under direct vision. Throughout the                            procedure, the patient's blood pressure, pulse, and                            oxygen saturations were monitored continuously. The                            Endoscope was introduced through the mouth, and                            advanced to the second part of duodenum. The upper                            GI  endoscopy was accomplished without difficulty.                            The patient tolerated the procedure well. Scope In: Scope Out: Findings:                 LA Grade A (one or more mucosal breaks less than 5                            mm, not extending between tops of 2 mucosal folds)                            esophagitis was found at the gastroesophageal                            junction.                           A small, sliding hiatal hernia was present.                           The exam of the esophagus was otherwise normal.                           The entire examined stomach was normal. Biopsies                            were taken with a cold forceps for histology.                            ([facility] protocol).                           The cardia and gastric fundus were normal on                            retroflexion.                           The examined duodenum was normal. Complications:  No immediate complications. Estimated Blood Loss:     Estimated blood loss was minimal. Impression:               - LA Grade A esophagitis.                           - Small hiatal hernia.                           - Normal stomach. Biopsied.                           - Normal examined duodenum.                           If biopsies negative for H pylori, patient's                            overall symptom complex most consistent with a                            functional bowel disorder. Recommendation:           - Patient has a contact number available for                            emergencies. The signs and symptoms of potential                            delayed complications were discussed with the                            patient. Return to normal activities tomorrow.                            Written discharge instructions were provided to the                            patient.                           - Resume previous diet.                            - Continue present medications.                           - Await pathology results.                           - See the other procedure note for documentation of                            additional recommendations. Henry L. Myrtie Neitheranis, MD 07/18/2019 2:43:19 PM This report has been signed electronically.

## 2019-07-18 NOTE — Patient Instructions (Signed)
Handout provided with information about Hiatal Hernia.   Biopsies were collected during the upper endoscopy and results from pathology usually take 10-14 days to receive in the mail.  YOU HAD AN ENDOSCOPIC PROCEDURE TODAY AT [facility]:   Refer to the procedure report that was given to you for any specific questions about what was found during the examination.  If the procedure report does not answer your questions, please call your gastroenterologist to clarify.  If you requested that your care partner not be given the details of your procedure findings, then the procedure report has been included in a sealed envelope for you to review at your convenience later.  YOU SHOULD EXPECT: Some feelings of bloating in the abdomen. Passage of more gas than usual.  Walking can help get rid of the air that was put into your GI tract during the procedure and reduce the bloating. If you had a lower endoscopy (such as a colonoscopy or flexible sigmoidoscopy) you may notice spotting of blood in your stool or on the toilet paper. If you underwent a bowel prep for your procedure, you may not have a normal bowel movement for a few days.  Please Note:  You might notice some irritation and congestion in your nose or some drainage.  This is from the oxygen used during your procedure.  There is no need for concern and it should clear up in a day or so.  SYMPTOMS TO REPORT IMMEDIATELY:   Following lower endoscopy (colonoscopy or flexible sigmoidoscopy):  Excessive amounts of blood in the stool  Significant tenderness or worsening of abdominal pains  Swelling of the abdomen that is new, acute  Fever of 100F or higher   Following upper endoscopy (EGD)  Vomiting of blood or coffee ground material  New chest pain or pain under the shoulder blades  Painful or persistently difficult swallowing  New shortness of breath  Fever of 100F or higher  Black, tarry-looking stools  For urgent or emergent  issues, a gastroenterologist can be reached at any hour by calling [phone].   DIET:  We do recommend a small meal at first, but then you may proceed to your regular diet.  Drink plenty of fluids but you should avoid alcoholic beverages for 24 hours.  ACTIVITY:  You should plan to take it easy for the rest of today and you should NOT DRIVE or use heavy machinery until tomorrow (because of the sedation medicines used during the test).    FOLLOW UP: Our staff will call the number listed on your records 48-72 hours following your procedure to check on you and address any questions or concerns that you may have regarding the information given to you following your procedure. If we do not reach you, we will leave a message.  We will attempt to reach you two times.  During this call, we will ask if you have developed any symptoms of COVID 19. If you develop any symptoms (ie: fever, flu-like symptoms, shortness of breath, cough etc.) before then, please call [phone].  If you test positive for Covid 19 in the 2 weeks post procedure, please call and report this information to [name].    If any biopsies were taken you will be contacted by phone or by letter within the next 1-3 weeks.  Please call us at [phone] if you have not heard about the biopsies in 3 weeks.    SIGNATURES/CONFIDENTIALITY: You and/or your care partner have signed paperwork which  will be entered into your electronic medical record.  These signatures attest to the fact that that the information above on your After Visit Summary has been reviewed and is understood.  Full responsibility of the confidentiality of this discharge information lies with you and/or your care-partner.

## 2019-07-18 NOTE — Progress Notes (Signed)
Called to room to assist during endoscopic procedure.  Patient ID and intended procedure confirmed with present staff. Received instructions for my participation in the procedure from the performing physician.  

## 2019-07-18 NOTE — Progress Notes (Signed)
Report given to PACU, vss 

## 2019-07-20 ENCOUNTER — Telehealth: Payer: Self-pay

## 2019-07-20 NOTE — Telephone Encounter (Signed)
  Follow up Call-  Call back number 07/18/2019  Post procedure Call Back phone  # [phone] number ( friend)  Permission to leave phone message Yes  Some recent data might be hidden     Patient questions:  Do you have a fever, pain , or abdominal swelling? No. Pain Score  0 *  Have you tolerated food without any problems? Yes.    Have you been able to return to your normal activities? Yes.    Do you have any questions about your discharge instructions: Diet   No. Medications  No. Follow up visit  No.  Do you have questions or concerns about your Care? No.  Actions: * If pain score is 4 or above: No action needed, pain <4.  1. Have you developed a fever since your procedure? no  2.   Have you had an respiratory symptoms (SOB or cough) since your procedure? no  3.   Have you tested positive for COVID 19 since your procedure no  4.   Have you had any family members/close contacts diagnosed with the COVID 19 since your procedure?  no   If yes to any of these questions please route to Joylene John, RN and Alphonsa Gin, Therapist, sports.

## 2019-07-28 ENCOUNTER — Encounter: Payer: Self-pay | Admitting: *Deleted

## 2019-08-01 ENCOUNTER — Telehealth: Payer: Self-pay | Admitting: *Deleted

## 2019-08-01 NOTE — Telephone Encounter (Signed)
Pt states that she was Dx with a hiatal hernia after the referral from Dr. Enid Derry.   She wants to know what the next steps are.  Christen Bame, CMA

## 2019-08-01 NOTE — Telephone Encounter (Signed)
Patient needs to follow up with GI. They requested a follow up and tried to call per the result note from the surgical pathology.

## 2019-08-02 NOTE — Telephone Encounter (Signed)
Spoke with patient and informed her of next step.  She said that she realized after leaving this message that she hadn't followed up with them yet.  Noella Kipnis,CMA

## 2019-08-04 ENCOUNTER — Telehealth: Payer: Self-pay | Admitting: Pharmacist

## 2019-08-04 NOTE — Telephone Encounter (Signed)
Phone contact RE tobacco cessation.   Patient reported brief relapse of smoking of 2 cigarettes following the death of her dog ~ 10 days ago.   She only smoked two cigarettes without relief and stated she was confident she would now remain tobacco free.    Encouraged continued abstinence and plan for follow-up in 1 month by phone.

## 2019-08-04 NOTE — Telephone Encounter (Signed)
Noted and agree. 

## 2019-08-05 ENCOUNTER — Telehealth: Payer: Self-pay | Admitting: Gastroenterology

## 2019-08-05 NOTE — Telephone Encounter (Signed)
Left message on machine to call back  

## 2019-08-05 NOTE — Telephone Encounter (Signed)
Pt called to schedule US.  

## 2019-08-09 NOTE — Telephone Encounter (Signed)
Per Dr. Loletha Carrow result note, was able to finally contact the patient by phone, patient scheduled with [facility] on 11/12 at 11:30 am.

## 2019-08-25 ENCOUNTER — Ambulatory Visit: Payer: Medicaid Other | Admitting: Physician Assistant

## 2019-08-27 ENCOUNTER — Inpatient Hospital Stay (HOSPITAL_COMMUNITY)
Admission: AD | Admit: 2019-08-27 | Discharge: 2019-08-27 | Disposition: A | Discharge status: Home / Self Care | Payer: Medicaid Other | Attending: Obstetrics and Gynecology | Admitting: Obstetrics and Gynecology

## 2019-08-27 ENCOUNTER — Inpatient Hospital Stay (HOSPITAL_COMMUNITY): Payer: Medicaid Other

## 2019-08-27 ENCOUNTER — Other Ambulatory Visit: Payer: Self-pay

## 2019-08-27 ENCOUNTER — Encounter (HOSPITAL_COMMUNITY): Payer: Self-pay | Admitting: *Deleted

## 2019-08-27 DIAGNOSIS — Z833 Family history of diabetes mellitus: Secondary | ICD-10-CM | POA: Diagnosis not present

## 2019-08-27 DIAGNOSIS — Z87891 Personal history of nicotine dependence: Secondary | ICD-10-CM | POA: Insufficient documentation

## 2019-08-27 DIAGNOSIS — Z3A01 Less than 8 weeks gestation of pregnancy: Secondary | ICD-10-CM | POA: Insufficient documentation

## 2019-08-27 DIAGNOSIS — R110 Nausea: Secondary | ICD-10-CM | POA: Insufficient documentation

## 2019-08-27 DIAGNOSIS — Z3201 Encounter for pregnancy test, result positive: Secondary | ICD-10-CM | POA: Insufficient documentation

## 2019-08-27 DIAGNOSIS — Z791 Long term (current) use of non-steroidal anti-inflammatories (NSAID): Secondary | ICD-10-CM | POA: Diagnosis not present

## 2019-08-27 DIAGNOSIS — Z9851 Tubal ligation status: Secondary | ICD-10-CM | POA: Diagnosis not present

## 2019-08-27 DIAGNOSIS — O26899 Other specified pregnancy related conditions, unspecified trimester: Secondary | ICD-10-CM

## 2019-08-27 DIAGNOSIS — Z809 Family history of malignant neoplasm, unspecified: Secondary | ICD-10-CM | POA: Diagnosis not present

## 2019-08-27 DIAGNOSIS — O99891 Other specified diseases and conditions complicating pregnancy: Secondary | ICD-10-CM | POA: Insufficient documentation

## 2019-08-27 DIAGNOSIS — R1030 Lower abdominal pain, unspecified: Secondary | ICD-10-CM | POA: Insufficient documentation

## 2019-08-27 DIAGNOSIS — R109 Unspecified abdominal pain: Secondary | ICD-10-CM

## 2019-08-27 LAB — I-STAT BETA HCG BLOOD, ED (MC, WL, AP ONLY): I-stat hCG, quantitative: >2000 m[IU]/mL — ABNORMAL HIGH (ref <5)

## 2019-08-27 LAB — CBC
HCT: 36.1 % (ref 36.0–46.0)
Hemoglobin: 12.4 g/dL (ref 12.0–15.0)
MCH: 29.4 pg (ref 26.0–34.0)
MCHC: 34.3 g/dL (ref 30.0–36.0)
MCV: 85.5 fL (ref 80.0–100.0)
Platelets: 205 10*3/uL (ref 150–400)
RBC: 4.22 MIL/uL (ref 3.87–5.11)
RDW: 13.3 % (ref 11.5–15.5)
WBC: 9.2 10*3/uL (ref 4.0–10.5)
nRBC: 0.2 % (ref 0.0–0.2)

## 2019-08-27 LAB — COMPREHENSIVE METABOLIC PANEL
ALT: 12 U/L (ref 0–44)
AST: 17 U/L (ref 15–41)
Albumin: 3.8 g/dL (ref 3.5–5.0)
Alkaline Phosphatase: 48 U/L (ref 38–126)
Anion gap: 10 (ref 5–15)
BUN: 7 mg/dL (ref 6–20)
CO2: 25 mmol/L (ref 22–32)
Calcium: 9.5 mg/dL (ref 8.9–10.3)
Chloride: 102 mmol/L (ref 98–111)
Creatinine, Ser: 1.04 mg/dL — ABNORMAL HIGH (ref 0.44–1.00)
GFR calc Af Amer: >60 mL/min (ref >60)
GFR calc non Af Amer: >60 mL/min (ref >60)
Glucose, Bld: 108 mg/dL — ABNORMAL HIGH (ref 70–99)
Potassium: 4 mmol/L (ref 3.5–5.1)
Sodium: 137 mmol/L (ref 135–145)
Total Bilirubin: 0.5 mg/dL (ref 0.3–1.2)
Total Protein: 7 g/dL (ref 6.5–8.1)

## 2019-08-27 LAB — URINALYSIS, ROUTINE W REFLEX MICROSCOPIC
Bacteria, UA: NONE SEEN
Bilirubin Urine: NEGATIVE
Glucose, UA: NEGATIVE mg/dL
Ketones, ur: NEGATIVE mg/dL
Leukocytes,Ua: NEGATIVE
Nitrite: NEGATIVE
Protein, ur: NEGATIVE mg/dL
Specific Gravity, Urine: 1.012 (ref 1.005–1.030)
pH: 7 (ref 5.0–8.0)

## 2019-08-27 LAB — HCG, QUANTITATIVE, PREGNANCY: hCG, Beta Chain, Quant, S: 74268 m[IU]/mL — ABNORMAL HIGH (ref <5)

## 2019-08-27 LAB — LIPASE, BLOOD: Lipase: 31 U/L (ref 11–51)

## 2019-08-27 MED ORDER — ONDANSETRON 4 MG PO TBDP: 4.0000 mg | ORAL_TABLET | Freq: Three times a day (TID) | ORAL | 0 refills | Status: DC | PRN

## 2019-08-27 MED ORDER — SODIUM CHLORIDE 0.9% FLUSH: 3.0000 mL | Freq: Once | INTRAVENOUS | Status: DC

## 2019-08-27 NOTE — ED Triage Notes (Signed)
The pt is c/o abd pain she took a pregnancy test that was pos yesterday  heany abd cramps no bleeding tubes tied in February after 7 pregnancies   She has had this abd pain for 2 weeks

## 2019-08-27 NOTE — MAU Provider Note (Signed)
History     CSN: [id number]  Arrival date and time: 08/27/19 2019   None     Chief Complaint  Patient presents with  . Abdominal Pain   HPI  Katelyn Mcfarland is a 28 y.o. female [phone] @ Unknown with a history of tubal ligation 11/2018: Postpartum Bilateral Tubal Sterilization using Filshie Clips here with positive pregnancy test. She has had some lower abdominal pain, however no bleeding. The pain is cramp like and comes and goes. Some nausea, no vomiting. Says Zofran helps.    OB History    Gravida  6   Para  3   Term  3   Preterm  0   AB  3   Living  3     SAB  2   TAB  1   Ectopic  0   Multiple  0   Live Births  3           Past Medical History:  Diagnosis Date  . Allergy   . Chlamydia    age 21yo; hx/o trichomonas age 65yo  . Chronic headache   . Depression    doing ok, declined meds  . High cholesterol   . Infection    UTI  . Vaginal Pap smear, abnormal    bx, cryo    Past Surgical History:  Procedure Laterality Date  . INDUCED ABORTION    . TUBAL LIGATION Bilateral 11/15/2018   Procedure: POST PARTUM TUBAL LIGATION;  Surgeon: Chancy Milroy, MD;  Location: [facility];  Service: Gynecology;  Laterality: Bilateral;  . WISDOM TOOTH EXTRACTION      Family History  Problem Relation Age of Onset  . Hypertension Mother   . Diabetes Mother   . Asthma Sister   . Cancer Paternal Grandmother   . Cancer Paternal Grandfather   . Cancer Maternal Grandmother   . Kidney disease Maternal Grandmother   . Diabetes Maternal Grandmother   . Heart disease Neg Hx   . Stroke Neg Hx     Social History   Tobacco Use  . Smoking status: Former Smoker    Packs/day: 1.00    Years: 16.00    Pack years: 16.00    Types: Cigarettes    Start date: 10/13/2000  . Smokeless tobacco: Never Used  . Tobacco comment: stopped August 2020  Substance Use Topics  . Alcohol use: Not Currently    Comment: not currently, socially  . Drug use: Yes     Types: Marijuana    Comment: last Mar 2018    Allergies: No Known Allergies  Medications Prior to Admission  Medication Sig Dispense Refill Last Dose  . naproxen sodium (ALEVE) 220 MG tablet Take 220 mg by mouth.   08/27/2019 at Unknown time  . ondansetron (ZOFRAN-ODT) 4 MG disintegrating tablet Take 4 mg by mouth every 8 (eight) hours as needed for nausea or vomiting.   08/27/2019 at Unknown time  . ibuprofen (ADVIL,MOTRIN) 600 MG tablet Take 1 tablet (600 mg total) by mouth every 6 (six) hours. 30 tablet 0   . Misc Natural Products (APPLE CIDER VINEGAR DIET PO) Take 2 capsules by mouth.     Marland Kitchen omeprazole (PRILOSEC) 40 MG capsule Take 1 capsule (40 mg total) by mouth daily. (Patient not taking: Reported on 07/18/2019) 30 capsule 3   . SUPREP BOWEL PREP KIT 17.5-3.13-1.6 GM/177ML SOLN Take 1 kit by mouth as directed. For colonoscopy prep 354 mL 0    Results for orders  placed or performed during the hospital encounter of 08/27/19 (from the past 48 hour(s))  Urinalysis, Routine w reflex microscopic     Status: Abnormal   Collection Time: 08/27/19  8:28 PM  Result Value Ref Range   Color, Urine YELLOW YELLOW   APPearance HAZY (A) CLEAR   Specific Gravity, Urine 1.012 1.005 - 1.030   pH 7.0 5.0 - 8.0   Glucose, UA NEGATIVE NEGATIVE mg/dL   Hgb urine dipstick SMALL (A) NEGATIVE   Bilirubin Urine NEGATIVE NEGATIVE   Ketones, ur NEGATIVE NEGATIVE mg/dL   Protein, ur NEGATIVE NEGATIVE mg/dL   Nitrite NEGATIVE NEGATIVE   Leukocytes,Ua NEGATIVE NEGATIVE   RBC / HPF 0-5 0 - 5 RBC/hpf   WBC, UA 0-5 0 - 5 WBC/hpf   Bacteria, UA NONE SEEN NONE SEEN   Squamous Epithelial / LPF 11-20 0 - 5    Comment: Performed at [facility] [address] [place], [place] [address]  Lipase, blood     Status: None   Collection Time: 08/27/19  8:37 PM  Result Value Ref Range   Lipase 31 11 - 51 U/L    Comment: Performed at [facility] [address], [place], [place] [address]  Comprehensive  metabolic panel     Status: Abnormal   Collection Time: 08/27/19  8:37 PM  Result Value Ref Range   Sodium 137 135 - 145 mmol/L   Potassium 4.0 3.5 - 5.1 mmol/L   Chloride 102 98 - 111 mmol/L   CO2 25 22 - 32 mmol/L   Glucose, Bld 108 (H) 70 - 99 mg/dL   BUN 7 6 - 20 mg/dL   Creatinine, Ser 1.04 (H) 0.44 - 1.00 mg/dL   Calcium 9.5 8.9 - 10.3 mg/dL   Total Protein 7.0 6.5 - 8.1 g/dL   Albumin 3.8 3.5 - 5.0 g/dL   AST 17 15 - 41 U/L   ALT 12 0 - 44 U/L   Alkaline Phosphatase 48 38 - 126 U/L   Total Bilirubin 0.5 0.3 - 1.2 mg/dL   GFR calc non Af Amer >60 >60 mL/min   GFR calc Af Amer >60 >60 mL/min   Anion gap 10 5 - 15    Comment: Performed at [facility] [address], [place] [address]  CBC     Status: None   Collection Time: 08/27/19  8:37 PM  Result Value Ref Range   WBC 9.2 4.0 - 10.5 K/uL   RBC 4.22 3.87 - 5.11 MIL/uL   Hemoglobin 12.4 12.0 - 15.0 g/dL   HCT 36.1 36.0 - 46.0 %   MCV 85.5 80.0 - 100.0 fL   MCH 29.4 26.0 - 34.0 pg   MCHC 34.3 30.0 - 36.0 g/dL   RDW 13.3 11.5 - 15.5 %   Platelets 205 150 - 400 K/uL   nRBC 0.2 0.0 - 0.2 %    Comment: Performed at [facility], [place] [address] [place], [place] [address]  I-Stat beta hCG blood, ED     Status: Abnormal   Collection Time: 08/27/19  8:42 PM  Result Value Ref Range   I-stat hCG, quantitative >2,000.0 (H) <5 mIU/mL   Comment 3            Comment:   GEST. AGE      CONC.  (mIU/mL)   <=1 WEEK        5 - 50     2 WEEKS  50 - 500     3 WEEKS       100 - 10,000     4 WEEKS     1,000 - 30,000        FEMALE AND NON-PREGNANT FEMALE:     LESS THAN 5 mIU/mL   hCG, quantitative, pregnancy     Status: Abnormal   Collection Time: 08/27/19  9:46 PM  Result Value Ref Range   hCG, Beta Chain, Quant, S 74,268 (H) <5 mIU/mL    Comment:          GEST. AGE      CONC.  (mIU/mL)   <=1 WEEK        5 - 50     2 WEEKS       50 - 500     3 WEEKS       100 - 10,000     4 WEEKS     1,000 - 30,000      5 WEEKS     3,500 - 115,000   6-8 WEEKS     12,000 - 270,000    12 WEEKS     15,000 - 220,000        FEMALE AND NON-PREGNANT FEMALE:     LESS THAN 5 mIU/mL Performed at [facility], [place] [address], [place], [place] [address]    US Ob Comp Less 14 Wks  Result Date: 08/27/2019 CLINICAL DATA:  Bilateral tubal ligation with positive pregnancy test EXAM: OBSTETRIC <14 WK ULTRASOUND TECHNIQUE: Transabdominal ultrasound was performed for evaluation of the gestation as well as the maternal uterus and adnexal regions. COMPARISON:  None. FINDINGS: Intrauterine gestational sac: Single Yolk sac:  Visualized Embryo:  Visualized Cardiac Activity: Visualized Heart Rate: 150 bpm MSD:    mm    w     d CRL:   11.2 mm   7 w 1 d                  [name] EDC: 04/13/2020 Subchorionic hemorrhage:  None visualized. Maternal uterus/adnexae: No adnexal mass or free fluid IMPRESSION: 7 week 1 day intrauterine pregnancy. Fetal heart rate 150 beats per minute. No acute maternal findings. Electronically Signed   By: Rolm Baptise M.D.   On: 08/27/2019 22:35   Review of Systems  Constitutional: Negative for fever.  Gastrointestinal: Positive for abdominal pain, nausea and vomiting.  Genitourinary: Negative for vaginal bleeding and vaginal discharge.   Physical Exam   Blood pressure 109/71, pulse 97, temperature 98.7 F (37.1 C), temperature source Oral, resp. rate 20, height '4\' 11"'  (1.499 m), weight 102.9 kg, SpO2 100 %, not currently breastfeeding.  Physical Exam  Constitutional: She is oriented to person, place, and time. She appears well-developed and well-nourished. No distress.  HENT:  Head: Normocephalic.  GI: Soft. She exhibits no distension. There is no abdominal tenderness. There is no rebound.  Musculoskeletal: Normal range of motion.  Neurological: She is alert and oriented to person, place, and time.  Skin: Skin is warm. She is not diaphoretic.  Psychiatric: Her behavior is normal.   MAU Course   Procedures  None  MDM  [name] and Hcg level ordered Discussed the [name] in detail with the patient. She voices she upset that she is pregnant.   Assessment and Plan   A:  1. Abdominal pain during pregnancy, antepartum   2. Status post tubal ligation   3. Pregnancy test positive     P:  Discharge home  in stable condition Start prenatal care Prenatal vitamins daily Patient request refill on Zofran. Return to MAU if symptoms worsen   Kristen Bushway, Artist Pais, NP 08/27/2019 11:04 PM

## 2019-08-27 NOTE — MAU Note (Signed)
Pt reports lower abdominal pain x1 week. Nausea x1 month. Has leftover Zofran so used that which helps. LMP: unsure. Irregular periods x4 months. BTL in February of this year. +upt at home yesterday. No bleeding or discharge.

## 2019-08-27 NOTE — Discharge Instructions (Signed)
Abdominal Pain During Pregnancy ° °Belly (abdominal) pain is common during pregnancy. There are many possible causes. Most of the time, it is not a serious problem. Other times, it can be a sign that something is wrong with the pregnancy. Always tell your doctor if you have belly pain. °Follow these instructions at home: °· Do not have sex or put anything in your vagina until your pain goes away completely. °· Get plenty of rest until your pain gets better. °· Drink enough fluid to keep your pee (urine) pale yellow. °· Take over-the-counter and prescription medicines only as told by your doctor. °· Keep all follow-up visits as told by your doctor. This is important. °Contact a doctor if: °· Your pain continues or gets worse after resting. °· You have lower belly pain that: °? Comes and goes at regular times. °? Spreads to your back. °? Feels like menstrual cramps. °· You have pain or burning when you pee (urinate). °Get help right away if: °· You have a fever or chills. °· You have vaginal bleeding. °· You are leaking fluid from your vagina. °· You are passing tissue from your vagina. °· You throw up (vomit) for more than 24 hours. °· You have watery poop (diarrhea) for more than 24 hours. °· Your baby is moving less than usual. °· You feel very weak or faint. °· You have shortness of breath. °· You have very bad pain in your upper belly. °Summary °· Belly (abdominal) pain is common during pregnancy. There are many possible causes. °· If you have belly pain during pregnancy, tell your doctor right away. °· Keep all follow-up visits as told by your doctor. This is important. °This information is not intended to replace advice given to you by your health care provider. Make sure you discuss any questions you have with your health care provider. °Document Released: 09/17/2009 Document Revised: 01/17/2019 Document Reviewed: 01/01/2017 °Elsevier Patient Education © 2020 Elsevier Inc. ° °

## 2019-08-28 ENCOUNTER — Encounter: Payer: Self-pay | Admitting: Obstetrics and Gynecology

## 2019-08-28 DIAGNOSIS — Z9851 Tubal ligation status: Secondary | ICD-10-CM | POA: Insufficient documentation

## 2019-08-31 ENCOUNTER — Other Ambulatory Visit: Payer: Self-pay

## 2019-08-31 ENCOUNTER — Encounter: Payer: Self-pay | Admitting: Family Medicine

## 2019-08-31 ENCOUNTER — Ambulatory Visit (INDEPENDENT_AMBULATORY_CARE_PROVIDER_SITE_OTHER): Payer: Medicaid Other | Admitting: Family Medicine

## 2019-08-31 DIAGNOSIS — Z9851 Tubal ligation status: Secondary | ICD-10-CM | POA: Diagnosis present

## 2019-08-31 NOTE — Progress Notes (Signed)
Subjective:    Katelyn Mcfarland - 28 y.o. female MRN [id number]  Date of birth: 18-Mar-1991  CC:  Katelyn Mcfarland is here for follow up of abdominal pain after recently diagnosed pregnancy.  HPI:  Patient reports abdominal cramping 3-4 times per day with a bowel movement each time.  This has been occurring since early July.  Her exam for her initial appointment was benign, and she has been referred to [facility].  They performed an endoscopy and colonoscopy which did not show any abnormalities.  She has also had accompanying nausea, which has improved over the last couple of months.  She describes her stools as firm and denies diarrhea.  Per chart review, GI attributed her symptoms to likely IBS in the setting of normal-appearing exam and concurrent symptoms of depression.    After she gave birth on November 14, 2018, she received tubal ligation via placement of clips.  She said that she never felt right after this operation, and was discovered to be [redacted] weeks pregnant on ultrasound on 11/14.  She is very upset about the news of this pregnancy, since she says that she cannot afford another child and will need to terminate the pregnancy.  She feels that her symptoms have been minimized by previous physicians and wishes that she had been given more choices for the types of tubal ligation instead of just given what Medicaid will pay for.  She was also told that her clips had fallen off at her ultrasound and she is worried that they may be causing pain elsewhere.  She denies vaginal bleeding.  Has felt pain and weakness in her legs since Thursday.  She says that the pain is generalized to both of her legs and does not run down the back of either leg.  She says that sometimes she will feel like her legs will collapse out from under her.  She denies numbness and tingling of her legs.  Health Maintenance:  Health Maintenance Due  Topic Date Due  . INFLUENZA VACCINE  05/14/2019    -  reports that she has  quit smoking. Her smoking use included cigarettes. She started smoking about 18 years ago. She has a 16.00 pack-year smoking history. She has never used smokeless tobacco. - Review of Systems: Per HPI. - Past Medical History: Patient Active Problem List   Diagnosis Date Noted  . Failed Tubal ligation 08/28/2019  . Chronic vomiting 06/07/2019  . Depression 06/07/2019  . Suspected COVID-19 virus infection 06/07/2019  . Abdominal pain 05/19/2019  . Sickle cell trait (HCC) 05/01/2018  . BMI 35-39 05/01/2018  . Current smoker 01/11/2014   - Medications: reviewed and updated   Objective:   Physical Exam BP 116/68   Pulse 87   Wt 226 lb (102.5 kg)   LMP  (LMP Unknown) Comment: 1st week of september 2020, pt has had BTL and no chance of pregnancy per pt  SpO2 99%   BMI 45.65 kg/m  Gen: NAD, alert, cooperative with exam CV: RRR, good S1/S2, no murmur Resp: CTABL, no wheezes, non-labored Abd: Mildly tender in the epigastric area, no rebound tenderness, normal bowel sounds, soft Extremities: Bilateral legs without visual deformity, straight leg test negative bilaterally, normal gait Psych: good insight, alert and oriented, tearful and upset throughout exam  Depression screen [facility] 2/9 09/01/2019  Decreased Interest 3  Down, Depressed, Hopeless 3  PHQ - 2 Score 6  Altered sleeping 3  Tired, decreased energy 3  Change in appetite  3  Feeling bad or failure about yourself  3  Trouble concentrating 3  Moving slowly or fidgety/restless 2  Suicidal thoughts 0  PHQ-9 Score 23  Difficult doing work/chores Extremely dIfficult        Assessment & Plan:   Failed Tubal ligation Patient's current pregnancy could certainly be a component of her current abdominal pain, although her abdominal pain has predated her pregnancy, so there is likely an element of IBS given her normal EGD and colonoscopy.  Patient's leg pain could also be a sequelae of her acute stress.  We will reinvestigate both of  these issues if they continue after she has terminated the pregnancy and recovered from her current situation.  [facility] patient's tubal ligation, and she says that failure rates are 1 to 2% with placement of tubal clips.  Usually the clips are still in place even with a failed ligation, but if they do fall off, they pose no harm to her.  Medicaid now pays for actual removal of parts of the fallopian tubes since August 2020, so this is an option for her if she chooses.  This was relayed to the patient.  Empathetic listening was provided, and patient was asked if she would like to speak with a therapist further about her very upsetting and stressful situation, which she declined.  She was provided with resources for termination of the pregnancy.  I will plan to follow-up with her via phone later this week.    Maia Breslow, M.D. 09/01/2019, 8:16 AM PGY-3, [facility]

## 2019-08-31 NOTE — Patient Instructions (Signed)
It was nice meeting you today Ms. Yellowhair!  I have included pregnancy termination resources for you in this packet.  If your clips did fall off, they will not hurt you; however, they are likely still attached to your tubes.  Medicaid will not pay for a more complete tubal if you are interested in the future.  I am hoping that your abdominal pain will improve when you are no longer pregnant, but please do not hesitate to return if it persists.  I am sorry that this is happened to you.  Please take care of yourself and reach out if I can do anything else to help you.  If you have any questions or concerns, please feel free to call the clinic.   Be well,  Dr. Shan Levans

## 2019-09-01 ENCOUNTER — Telehealth: Payer: Self-pay | Admitting: Pharmacist

## 2019-09-01 NOTE — Telephone Encounter (Signed)
Reviewed chart prior to calling patient for tobacco cessation report and realized patient is likely more stressed with news of failed Tubal Ligation and 7 weeks pregnancy (determined ~ 5 days ago).   When contact I expressed concern for her and asked about her stress with her recent news.  She said she was "upset" and "stressed".   When asked about tobacco use, she reported smoking 1 cigarettes this past week.  She realizes she has made great strides to becoming tobacco free (quit since August) for 3 months.   She is committed to staying smoke free but also feels highly stressed right now.    Encouraged continued abstinence, offered support and follow-up phone call in ~ 4 weeks.  Asked patient to contact me / our office if she needed any additional support for stay quit from tobacco.

## 2019-09-01 NOTE — Assessment & Plan Note (Addendum)
Patient's current pregnancy could certainly be a component of her current abdominal pain, although her abdominal pain has predated her pregnancy, so there is likely an element of IBS given her normal EGD and colonoscopy.  Patient's leg pain could also be a sequelae of her acute stress.  We will reinvestigate both of these issues if they continue after she has terminated the pregnancy and recovered from her current situation.  I spoke with [facility] regarding patient's tubal ligation, and she says that failure rates are 1 to 2% with placement of tubal clips.  Usually the clips are still in place even with a failed ligation, but if they do fall off, they pose no harm to her.  Medicaid now pays for actual removal of parts of the fallopian tubes since August 2020, so this is an option for her if she chooses.  This was relayed to the patient.  Empathetic listening was provided, and patient was asked if she would like to speak with a therapist further about her very upsetting and stressful situation, which she declined.  She was provided with resources for termination of the pregnancy.  I will plan to follow-up with her via phone later this week.

## 2019-09-02 ENCOUNTER — Other Ambulatory Visit: Payer: Self-pay | Admitting: Family Medicine

## 2019-09-02 ENCOUNTER — Telehealth: Payer: Self-pay | Admitting: Family Medicine

## 2019-09-02 MED ORDER — PROMETHAZINE HCL 12.5 MG PO TABS: 12.5000 mg | ORAL_TABLET | Freq: Three times a day (TID) | ORAL | 0 refills | Status: DC | PRN

## 2019-09-02 NOTE — Telephone Encounter (Signed)
Called Katelyn Mcfarland to check in with her after her appointment two days ago.  She reports that she is "still here" and is experiencing some nausea and abdominal cramping.  She is taking Zofran for her nausea, which is minimally helpful.  I offered to send in a prescription for Phenergan if she would like and let her know that it can make her drowsy, so she should only take it prior to bed.  She was agreeable to this.  I also encouraged her to make an appointment with me or any of our physicians if she has any other concerns or just wants to talk, and the patient expressed thanks for the offer.

## 2019-09-05 ENCOUNTER — Telehealth: Payer: Self-pay

## 2019-09-05 NOTE — Telephone Encounter (Signed)
Katelyn Mcfarland is calling to ask if I know of any financial assistance resources so that she can afford an abortion due to her failed tubal ligation.  She says that even at the clinic where she qualifies for $100 off, it will still cost $400, which she cannot afford.  I told her that I will message our clinic social worker to see what resources she knows of and relay this information to her as soon as I could.

## 2019-09-05 NOTE — Telephone Encounter (Signed)
Pt calling to speak with Dr. Shan Levans. Pt was told by Dr. Shan Levans to call her if pt has any questions or concerns. Pt would like Dr. Shan Levans to call her at [phone]. Ottis Stain, CMA

## 2019-09-06 ENCOUNTER — Ambulatory Visit: Payer: Medicaid Other | Admitting: Physician Assistant

## 2019-09-16 ENCOUNTER — Telehealth: Payer: Self-pay | Admitting: Family Medicine

## 2019-09-16 NOTE — Telephone Encounter (Signed)
After receiving patient's call requesting financial assistance resources for her abortion, I messaged my social worker to see what resources she recommended.  I also left a voicemail on 11/27 on patient's cell phone with resources I had found after some research.  I left patient another message on 12/4 with the resources that our social worker recommended.  I encouraged patient to call our clinic or message me through [facility] if she had any other questions or concerns.

## 2019-09-29 ENCOUNTER — Telehealth: Payer: Self-pay | Admitting: Pharmacist

## 2019-09-29 NOTE — Telephone Encounter (Signed)
Contacted patient in follow-up of tobacco cessation attempt.  She reports complete abstinence from tobacco since August (> 3 months) and no longer using pills (nortriptyline) or nicotine patch.   She plans to "keep the baby" - following determination of lubal ligation failure she shared on last phone contact.   Given this stressful time and her decision to not have an abortion she reports continued abstinence from cigarettes.  Encouraged continued abstinence and offered support in the future if she was interested.  She did not request any additional support for tobacco cessation from me when queried.  She thanked me for the support and help with quitting.   Please refer back to me if needed in the future. I have enjoyed working with this patient.

## 2019-10-10 ENCOUNTER — Ambulatory Visit (INDEPENDENT_AMBULATORY_CARE_PROVIDER_SITE_OTHER): Payer: Medicaid Other | Admitting: Advanced Practice Midwife

## 2019-10-10 ENCOUNTER — Other Ambulatory Visit (HOSPITAL_COMMUNITY)
Admission: RE | Admit: 2019-10-10 | Discharge: 2019-10-10 | Disposition: A | Discharge status: Home / Self Care | Payer: Medicaid Other | Source: Ambulatory Visit | Attending: Advanced Practice Midwife | Admitting: Advanced Practice Midwife

## 2019-10-10 ENCOUNTER — Other Ambulatory Visit: Payer: Self-pay

## 2019-10-10 ENCOUNTER — Encounter: Payer: Self-pay | Admitting: Advanced Practice Midwife

## 2019-10-10 VITALS — BP 112/80 | HR 87 | Temp 98.4°F | Wt 217.0 lb

## 2019-10-10 DIAGNOSIS — Z3480 Encounter for supervision of other normal pregnancy, unspecified trimester: Secondary | ICD-10-CM | POA: Insufficient documentation

## 2019-10-10 DIAGNOSIS — O219 Vomiting of pregnancy, unspecified: Secondary | ICD-10-CM

## 2019-10-10 DIAGNOSIS — Z8759 Personal history of other complications of pregnancy, childbirth and the puerperium: Secondary | ICD-10-CM | POA: Insufficient documentation

## 2019-10-10 DIAGNOSIS — Z23 Encounter for immunization: Secondary | ICD-10-CM

## 2019-10-10 DIAGNOSIS — Z3481 Encounter for supervision of other normal pregnancy, first trimester: Secondary | ICD-10-CM | POA: Diagnosis not present

## 2019-10-10 DIAGNOSIS — Z3A13 13 weeks gestation of pregnancy: Secondary | ICD-10-CM

## 2019-10-10 DIAGNOSIS — Z9851 Tubal ligation status: Secondary | ICD-10-CM

## 2019-10-10 DIAGNOSIS — O99212 Obesity complicating pregnancy, second trimester: Secondary | ICD-10-CM | POA: Insufficient documentation

## 2019-10-10 MED ORDER — BLOOD PRESSURE KIT DEVI: 1.0000 | 0 refills | Status: DC

## 2019-10-10 MED ORDER — VITAFOL GUMMIES 3.33-0.333-34.8 MG PO CHEW: 1.0000 | CHEWABLE_TABLET | Freq: Every day | ORAL | 3 refills | Status: DC

## 2019-10-10 MED ORDER — ONDANSETRON HCL 4 MG PO TABS: 4.0000 mg | ORAL_TABLET | Freq: Three times a day (TID) | ORAL | 0 refills | Status: DC | PRN

## 2019-10-10 MED ORDER — PROMETHAZINE HCL 25 MG PO TABS: 12.5000 mg | ORAL_TABLET | Freq: Four times a day (QID) | ORAL | 2 refills | Status: DC | PRN

## 2019-10-10 MED ORDER — ASPIRIN EC 81 MG PO TBEC: 81.0000 mg | DELAYED_RELEASE_TABLET | Freq: Every day | ORAL | 5 refills | Status: DC

## 2019-10-10 NOTE — Progress Notes (Signed)
Subjective:   Katelyn Mcfarland is a 28 y.o. [id number] at 70w3dby early ultrasound being seen today for her first obstetrical visit.  Her obstetrical history is significant for BTL 11/2018, PP HTN on medications with previous pregnancy and has Current smoker; Sickle cell trait ([place]; BMI 35-39; Abdominal pain; Chronic vomiting; Depression; Suspected COVID-19 virus infection; Failed Tubal ligation; and Supervision of other normal pregnancy, antepartum on their problem list.. Patient does intend to breast feed. Pregnancy history fully reviewed.  Patient reports nausea and vomiting.  HISTORY: OB History  Gravida Para Term Preterm AB Living  '7 3 3 ' 0 3 3  SAB TAB Ectopic Multiple Live Births  2 1 0 0 3    # Outcome Date GA Lbr Len/2nd Weight Sex Delivery Anes PTL Lv  7 Current           6 Term 11/14/18 41w0d1:03 / 00:02 6 lb 12.6 oz (3.08 kg) F Vag-Spont Local  LIV     Name: Heberlein,GIRL Crystol     Apgar1: 8  Apgar5: 9  5 TAB           4 SAB           3 Term     F Vag-Spont  N LIV  2 Term     F Vag-Spont  N LIV  1 SAB            Past Medical History:  Diagnosis Date  . Allergy   . Chlamydia    age [age]yohx/o trichomonas age 89yo. Chronic headache   . Depression    doing ok, declined meds  . High cholesterol   . Infection    UTI  . Vaginal Pap smear, abnormal    bx, cryo   Past Surgical History:  Procedure Laterality Date  . INDUCED ABORTION    . TUBAL LIGATION Bilateral 11/15/2018   Procedure: POST PARTUM TUBAL LIGATION;  Surgeon: ErChancy MilroyMD;  [facility]: [facility]cology;  Laterality: Bilateral;  . WISDOM TOOTH EXTRACTION     Family History  Problem Relation Age of Onset  . Hypertension Mother   . Diabetes Mother   . Asthma Sister   . Cancer Paternal Grandmother   . Cancer Paternal Grandfather   . Cancer Maternal Grandmother   . Kidney disease Maternal Grandmother   . Diabetes Maternal Grandmother   . Heart disease Neg Hx   .  Stroke Neg Hx    Social History   Tobacco Use  . Smoking status: Former Smoker    Packs/day: 1.00    Years: 16.00    Pack years: 16.00    Types: Cigarettes    Start date: 10/13/2000  . Smokeless tobacco: Never Used  . Tobacco comment: stopped August 2020  Substance Use Topics  . Alcohol use: Not Currently    Comment: not currently, socially  . Drug use: Yes    Types: Marijuana    Comment: last Mar 2018   No Known Allergies Current Outpatient Medications on File Prior to Visit  Medication Sig Dispense Refill  . ondansetron (ZOFRAN-ODT) 4 MG disintegrating tablet Take 1 tablet (4 mg total) by mouth every 8 (eight) hours as needed for nausea or vomiting. 20 tablet 0  . Misc Natural Products (APPLE CIDER VINEGAR DIET PO) Take 2 capsules by mouth.    . naproxen sodium (ALEVE) 220 MG tablet Take 220 mg by mouth.    . Marland Kitchenmeprazole (PRILOSEC)  40 MG capsule Take 1 capsule (40 mg total) by mouth daily. (Patient not taking: Reported on 07/18/2019) 30 capsule 3  . promethazine (PHENERGAN) 12.5 MG tablet Take 1 tablet (12.5 mg total) by mouth every 8 (eight) hours as needed for nausea or vomiting. 20 tablet 0  . SUPREP BOWEL PREP KIT 17.5-3.13-1.6 GM/177ML SOLN Take 1 kit by mouth as directed. For colonoscopy prep 354 mL 0   No current facility-administered medications on file prior to visit.     Indications for ASA therapy (per uptodate) One of the following: Previous pregnancy with preeclampsia, especially early onset and with an adverse outcome No Multifetal gestation No Chronic hypertension No Type 1 or 2 diabetes mellitus No Chronic kidney disease No Autoimmune disease (antiphospholipid syndrome, systemic lupus erythematosus) No   Two or more of the following: Nulliparity No Obesity (body mass index >30 kg/m2) Yes Family history of preeclampsia in mother or sister No Age ?35 years No Sociodemographic characteristics (African American race, low socioeconomic level) Yes Personal risk  factors (eg, previous pregnancy with low birth weight or small for gestational age infant, previous adverse pregnancy outcome [eg, stillbirth], interval >10 years between pregnancies) No  Indications for early 1 hour GTT (per uptodate)  BMI >25 (>23 in Asian women) AND one of the following  Gestational diabetes mellitus in a previous pregnancy No Glycated hemoglobin ?5.7 percent (39 mmol/mol), impaired glucose tolerance, or impaired fasting glucose on previous testing No First-degree relative with diabetes Yes High-risk race/ethnicity (eg, African American, Latino, Native American, [place] American, Pacific Islander) Yes History of cardiovascular disease No Hypertension or on therapy for hypertension No High-density lipoprotein cholesterol level <35 mg/dL (0.90 mmol/L) and/or a triglyceride level >250 mg/dL (2.82 mmol/L) No Polycystic ovary syndrome No Physical inactivity No Other clinical condition associated with insulin resistance (eg, severe obesity, acanthosis nigricans) No Previous birth of an infant weighing ?4000 g No Previous stillbirth of unknown cause No Exam   Vitals:   10/10/19 0842  BP: 112/80  Pulse: 87  Temp: 98.4 F (36.9 C)  Weight: 217 lb (98.4 kg)      Uterus:     Pelvic Exam: Perineum: no hemorrhoids, normal perineum   Vulva: normal external genitalia, no lesions   Vagina:  normal mucosa, normal discharge   Cervix: no lesions and normal, pap smear done.    Adnexa: normal adnexa and no mass, fullness, tenderness   Bony Pelvis: average  System: General: well-developed, well-nourished female in no acute distress   Breast:  normal appearance, no masses or tenderness   Skin: normal coloration and turgor, no rashes   Neurologic: oriented, normal, negative, normal mood   Extremities: normal strength, tone, and muscle mass, ROM of all joints is normal   HEENT PERRLA, extraocular movement intact and sclera clear, anicteric   Mouth/Teeth mucous membranes moist,  pharynx normal without lesions and dental hygiene good   Neck supple and no masses   Cardiovascular: regular rate and rhythm   Respiratory:  no respiratory distress, normal breath sounds   Abdomen: soft, non-tender; bowel sounds normal; no masses,  no organomegaly     Assessment:   Pregnancy: [id number] Patient Active Problem List   Diagnosis Date Noted  . Supervision of other normal pregnancy, antepartum 10/10/2019  . Failed Tubal ligation 08/28/2019  . Chronic vomiting 06/07/2019  . Depression 06/07/2019  . Suspected COVID-19 virus infection 06/07/2019  . Abdominal pain 05/19/2019  . Sickle cell trait ([place]) 05/01/2018  . BMI 35-39 05/01/2018  . Current  smoker 01/11/2014     Plan:  1. Supervision of other normal pregnancy, antepartum --Anticipatory guidance about next visits/weeks of pregnancy given. --Next visit in person for AFP  - Cervicovaginal ancillary only( [place]) - Obstetric Panel, Including HIV - Culture, OB Urine - Genetic Screening - Enroll Patient in Babyscripts - Babyscripts Schedule Optimization - [name] MFM OB COMP + 14 WK; Future - HgB A1c - Flu Vaccine QUAD 36+ mos IM (Fluarix, Quad PF) -Rx for prenatal vitamin  2. Nausea and vomiting during pregnancy prior to [redacted] weeks gestation - promethazine (PHENERGAN) 25 MG tablet; Take 0.5-1 tablets (12.5-25 mg total) by mouth every 6 (six) hours as needed for nausea.  Dispense: 30 tablet; Refill: 2 - ondansetron (ZOFRAN) 4 MG tablet; Take 1 tablet (4 mg total) by mouth every 8 (eight) hours as needed for nausea or vomiting.  Dispense: 20 tablet; Refill: 0  3. H/O tubal ligation --Pt had BTL with fishie clips 11/15/18 after vaginal delivery on 11/14/18.  She then had abdominal pain and n/v and saw primary care 05/13/19.  No imaging was done but pregnancy test and STI testing were all negative.  She reports the pain continued until early weeks of pregnancy. Recently the pain has improved but she is concerned that the  filshie clips are not where they belong, as she was told they were not seen on [name] on 08/27/19.  --Discussed risks of pregnancy after BTL at rate of 1/100 approximately, similar to rate with IUD. Discussed options for contraception PP including LARCs vs repeat BTL with pt. She is currently undecided. --Next visit with MD to discuss surgery, follow up related to filshie clips.  4. History of gestational hypertension --BP treated PP with Norvasc, normotensive on f/u with PCP --See checklist above, pt meets criteria to start 81 mg ASA daily. Discussed with pt, questions answered. - aspirin EC 81 MG tablet; Take 1 tablet (81 mg total) by mouth daily.  Dispense: 30 tablet; Refill: 5  5. Obesity affecting pregnancy in second trimester --Meets critera for Baby ASA and early glucose testing.   Initial labs drawn. Continue prenatal vitamins. Discussed and offered genetic screening options, including Quad screen/AFP, NIPS testing, and option to decline testing. Benefits/risks/alternatives reviewed. Pt aware that anatomy US is form of genetic screening with lower accuracy in detecting trisomies than blood work.  Pt chooses genetic screening today. NIPS: requested. Ultrasound discussed; fetal anatomic survey: ordered. Problem list reviewed and updated. The nature of [facility] with multiple MDs and other Advanced Practice Providers was explained to patient; also emphasized that residents, students are part of our team. Routine obstetric precautions reviewed. Return in about 4 weeks (around 11/07/2019).   Fatima Blank, CNM 10/10/19 11:31 AM

## 2019-10-10 NOTE — Progress Notes (Signed)
NOB pregnant after BTL in February 2020.  C/o NV, cramping 8/10 x 3 months

## 2019-10-10 NOTE — Telephone Encounter (Signed)
Noted and agree. 

## 2019-10-10 NOTE — Patient Instructions (Signed)

## 2019-10-11 LAB — CERVICOVAGINAL ANCILLARY ONLY
Bacterial Vaginitis (gardnerella): POSITIVE — AB
Candida Glabrata: NEGATIVE
Candida Vaginitis: NEGATIVE
Chlamydia: NEGATIVE
Comment: NEGATIVE
Comment: NEGATIVE
Comment: NEGATIVE
Comment: NEGATIVE
Comment: NEGATIVE
Comment: NORMAL
Neisseria Gonorrhea: NEGATIVE
Trichomonas: NEGATIVE

## 2019-10-12 LAB — OBSTETRIC PANEL, INCLUDING HIV
Basophils Absolute: 0 10*3/uL (ref 0.0–0.2)
Basos: 0 %
EOS (ABSOLUTE): 0.1 10*3/uL (ref 0.0–0.4)
Eos: 1 %
HIV Screen 4th Generation wRfx: NONREACTIVE
Hematocrit: 38.1 % (ref 34.0–46.6)
Hemoglobin: 12.6 g/dL (ref 11.1–15.9)
Hepatitis B Surface Ag: NEGATIVE
Immature Grans (Abs): 0 10*3/uL (ref 0.0–0.1)
Immature Granulocytes: 0 %
Lymphocytes Absolute: 2.1 10*3/uL (ref 0.7–3.1)
Lymphs: 29 %
MCH: 28.7 pg (ref 26.6–33.0)
MCHC: 33.1 g/dL (ref 31.5–35.7)
MCV: 87 fL (ref 79–97)
Monocytes Absolute: 0.4 10*3/uL (ref 0.1–0.9)
Monocytes: 5 %
Neutrophils Absolute: 4.8 10*3/uL (ref 1.4–7.0)
Neutrophils: 65 %
Platelets: 225 10*3/uL (ref 150–450)
RBC: 4.39 x10E6/uL (ref 3.77–5.28)
RDW: 13.9 % (ref 11.7–15.4)
RPR Ser Ql: NONREACTIVE
Rh Factor: POSITIVE
Rubella Antibodies, IGG: 9.63 index (ref >0.99)
WBC: 7.4 10*3/uL (ref 3.4–10.8)

## 2019-10-12 LAB — HEMOGLOBIN A1C
Est. average glucose Bld gHb Est-mCnc: 108 mg/dL
Hgb A1c MFr Bld: 5.4 % (ref 4.8–5.6)

## 2019-10-12 LAB — CULTURE, OB URINE

## 2019-10-12 LAB — URINE CULTURE, OB REFLEX

## 2019-10-12 LAB — AB SCR+ANTIBODY ID: Antibody Screen: POSITIVE — AB

## 2019-10-14 NOTE — L&D Delivery Note (Signed)
Patient: Katelyn Mcfarland MRN: [id number]  GBS status: Positive, IAP given: Ampicillin   Patient is a 29 y.o. now G7P4 s/p NSVD at [redacted]w[redacted]d, who was admitted for SOL. SROM 0h 58m prior to delivery with clear fluid.    Delivery Note At 12:54 PM a viable female was delivered via Vaginal, Spontaneous (Presentation: Left Occiput Anterior).  APGAR: 8, 9; weight 8 lb 1.5 oz (3671 g).   Placenta status: Spontaneous, Intact.  Cord: 3 vessels with the following complications: None.  Cord pH: N/A  Anesthesia: None Episiotomy: None Lacerations: None Suture Repair: N/A Est. Blood Loss (mL):  186  Labor complicated by precipitous delivery. Head delivered LOA. No nuchal cord present. Shoulder and body delivered in usual fashion. Infant with spontaneous cry, placed on mother's abdomen, dried and bulb suctioned. Cord clamped x 2 after 1-minute delay, and cut by family member. Cord blood drawn. Placenta delivered spontaneously with gentle cord traction. Fundus firm with massage and Pitocin. Perineum inspected and found to have no lacerations.  Mom to postpartum.  Baby to Couplet care / Skin to Skin.  De Hollingshead 04/11/2020, 6:57 PM

## 2019-10-19 ENCOUNTER — Encounter: Payer: Self-pay | Admitting: Advanced Practice Midwife

## 2019-10-19 DIAGNOSIS — O360110 Maternal care for anti-D [Rh] antibodies, first trimester, not applicable or unspecified: Secondary | ICD-10-CM | POA: Insufficient documentation

## 2019-10-20 ENCOUNTER — Other Ambulatory Visit: Payer: Self-pay

## 2019-10-20 DIAGNOSIS — N898 Other specified noninflammatory disorders of vagina: Secondary | ICD-10-CM

## 2019-10-20 MED ORDER — METRONIDAZOLE 0.75 % VA GEL: 1.0000 | Freq: Every day | VAGINAL | 1 refills | Status: DC

## 2019-10-20 NOTE — Progress Notes (Signed)
Rx sent pt complaining of vaginal irritation.

## 2019-10-24 ENCOUNTER — Encounter: Payer: Self-pay | Admitting: Advanced Practice Midwife

## 2019-10-25 ENCOUNTER — Encounter: Payer: Self-pay | Admitting: Advanced Practice Midwife

## 2019-10-25 DIAGNOSIS — Z148 Genetic carrier of other disease: Secondary | ICD-10-CM | POA: Insufficient documentation

## 2019-10-26 ENCOUNTER — Inpatient Hospital Stay (HOSPITAL_BASED_OUTPATIENT_CLINIC_OR_DEPARTMENT_OTHER): Payer: Medicaid Other

## 2019-10-26 ENCOUNTER — Inpatient Hospital Stay (HOSPITAL_COMMUNITY): Payer: Medicaid Other

## 2019-10-26 ENCOUNTER — Encounter (HOSPITAL_COMMUNITY): Payer: Self-pay | Admitting: Obstetrics and Gynecology

## 2019-10-26 ENCOUNTER — Inpatient Hospital Stay (HOSPITAL_COMMUNITY)
Admission: AD | Admit: 2019-10-26 | Discharge: 2019-10-26 | Disposition: A | Discharge status: Home / Self Care | Payer: Medicaid Other | Attending: Obstetrics and Gynecology | Admitting: Obstetrics and Gynecology

## 2019-10-26 ENCOUNTER — Other Ambulatory Visit: Payer: Self-pay

## 2019-10-26 ENCOUNTER — Telehealth: Payer: Self-pay

## 2019-10-26 DIAGNOSIS — R109 Unspecified abdominal pain: Secondary | ICD-10-CM | POA: Diagnosis present

## 2019-10-26 DIAGNOSIS — N949 Unspecified condition associated with female genital organs and menstrual cycle: Secondary | ICD-10-CM

## 2019-10-26 DIAGNOSIS — O26892 Other specified pregnancy related conditions, second trimester: Secondary | ICD-10-CM

## 2019-10-26 DIAGNOSIS — O26899 Other specified pregnancy related conditions, unspecified trimester: Secondary | ICD-10-CM

## 2019-10-26 DIAGNOSIS — O26891 Other specified pregnancy related conditions, first trimester: Secondary | ICD-10-CM | POA: Diagnosis not present

## 2019-10-26 DIAGNOSIS — Z3A15 15 weeks gestation of pregnancy: Secondary | ICD-10-CM | POA: Diagnosis not present

## 2019-10-26 DIAGNOSIS — B373 Candidiasis of vulva and vagina: Secondary | ICD-10-CM | POA: Insufficient documentation

## 2019-10-26 DIAGNOSIS — Z87891 Personal history of nicotine dependence: Secondary | ICD-10-CM | POA: Insufficient documentation

## 2019-10-26 DIAGNOSIS — M549 Dorsalgia, unspecified: Secondary | ICD-10-CM

## 2019-10-26 DIAGNOSIS — R102 Pelvic and perineal pain: Secondary | ICD-10-CM | POA: Diagnosis not present

## 2019-10-26 DIAGNOSIS — B3731 Acute candidiasis of vulva and vagina: Secondary | ICD-10-CM

## 2019-10-26 DIAGNOSIS — O99212 Obesity complicating pregnancy, second trimester: Secondary | ICD-10-CM

## 2019-10-26 DIAGNOSIS — O98812 Other maternal infectious and parasitic diseases complicating pregnancy, second trimester: Secondary | ICD-10-CM | POA: Insufficient documentation

## 2019-10-26 LAB — CBC WITH DIFFERENTIAL/PLATELET
Abs Immature Granulocytes: 0.04 10*3/uL (ref 0.00–0.07)
Basophils Absolute: 0 10*3/uL (ref 0.0–0.1)
Basophils Relative: 0 %
Eosinophils Absolute: 0.1 10*3/uL (ref 0.0–0.5)
Eosinophils Relative: 1 %
HCT: 35.9 % — ABNORMAL LOW (ref 36.0–46.0)
Hemoglobin: 12 g/dL (ref 12.0–15.0)
Immature Granulocytes: 1 %
Lymphocytes Relative: 22 %
Lymphs Abs: 1.7 10*3/uL (ref 0.7–4.0)
MCH: 28.7 pg (ref 26.0–34.0)
MCHC: 33.4 g/dL (ref 30.0–36.0)
MCV: 85.9 fL (ref 80.0–100.0)
Monocytes Absolute: 0.5 10*3/uL (ref 0.1–1.0)
Monocytes Relative: 6 %
Neutro Abs: 5.3 10*3/uL (ref 1.7–7.7)
Neutrophils Relative %: 70 %
Platelets: 211 10*3/uL (ref 150–400)
RBC: 4.18 MIL/uL (ref 3.87–5.11)
RDW: 14.3 % (ref 11.5–15.5)
WBC: 7.5 10*3/uL (ref 4.0–10.5)
nRBC: 0 % (ref 0.0–0.2)

## 2019-10-26 LAB — URINALYSIS, ROUTINE W REFLEX MICROSCOPIC
Bilirubin Urine: NEGATIVE
Glucose, UA: NEGATIVE mg/dL
Hgb urine dipstick: NEGATIVE
Ketones, ur: NEGATIVE mg/dL
Leukocytes,Ua: NEGATIVE
Nitrite: NEGATIVE
Protein, ur: NEGATIVE mg/dL
Specific Gravity, Urine: 1.012 (ref 1.005–1.030)
pH: 6 (ref 5.0–8.0)

## 2019-10-26 LAB — COMPREHENSIVE METABOLIC PANEL
ALT: 11 U/L (ref 0–44)
AST: 15 U/L (ref 15–41)
Albumin: 3.3 g/dL — ABNORMAL LOW (ref 3.5–5.0)
Alkaline Phosphatase: 36 U/L — ABNORMAL LOW (ref 38–126)
Anion gap: 6 (ref 5–15)
BUN: <5 mg/dL — ABNORMAL LOW (ref 6–20)
CO2: 25 mmol/L (ref 22–32)
Calcium: 9.1 mg/dL (ref 8.9–10.3)
Chloride: 106 mmol/L (ref 98–111)
Creatinine, Ser: 0.64 mg/dL (ref 0.44–1.00)
GFR calc Af Amer: >60 mL/min (ref >60)
GFR calc non Af Amer: >60 mL/min (ref >60)
Glucose, Bld: 89 mg/dL (ref 70–99)
Potassium: 4.1 mmol/L (ref 3.5–5.1)
Sodium: 137 mmol/L (ref 135–145)
Total Bilirubin: 0.4 mg/dL (ref 0.3–1.2)
Total Protein: 6.4 g/dL — ABNORMAL LOW (ref 6.5–8.1)

## 2019-10-26 LAB — WET PREP, GENITAL
Clue Cells Wet Prep HPF POC: NONE SEEN
Sperm: NONE SEEN
Trich, Wet Prep: NONE SEEN
WBC, Wet Prep HPF POC: NONE SEEN
Yeast Wet Prep HPF POC: NONE SEEN

## 2019-10-26 MED ORDER — HYDROMORPHONE HCL 1 MG/ML IJ SOLN: 1.0000 mg | Freq: Once | INTRAMUSCULAR | Status: DC

## 2019-10-26 MED ORDER — LACTATED RINGERS IV SOLN: INTRAVENOUS | Status: DC

## 2019-10-26 MED ORDER — CYCLOBENZAPRINE HCL 10 MG PO TABS: 10.0000 mg | ORAL_TABLET | Freq: Three times a day (TID) | ORAL | 0 refills | Status: DC | PRN

## 2019-10-26 MED ORDER — IBUPROFEN 600 MG PO TABS
600.0000 mg | ORAL_TABLET | Freq: Once | ORAL | Status: AC
  Administered 2019-10-26: 600 mg via ORAL
  Filled 2019-10-26: qty 1

## 2019-10-26 MED ORDER — TERCONAZOLE 0.4 % VA CREA: 1.0000 | TOPICAL_CREAM | Freq: Every day | VAGINAL | 0 refills | Status: AC

## 2019-10-26 MED ORDER — SODIUM CHLORIDE 0.9 % IV SOLN
8.0000 mg | Freq: Once | INTRAVENOUS | Status: DC
  Filled 2019-10-26: qty 4

## 2019-10-26 MED ORDER — ONDANSETRON 4 MG PO TBDP
8.0000 mg | ORAL_TABLET | Freq: Once | ORAL | Status: AC
  Administered 2019-10-26: 8 mg via ORAL
  Filled 2019-10-26: qty 2

## 2019-10-26 NOTE — MAU Note (Signed)
Katelyn Mcfarland is a 29 y.o. at [redacted]w[redacted]d here in MAU reporting: lower back pain, abdominal pain, and pelvic pain for the last 2 days. Started taking metrogel for BV 2 days ago and that's when the pain started. No vaginal bleeding or other abnormal discharge. Took tylenol for pain with no relief.   Onset of complaint: 2 days  Pain score: abdominal pain 10/10, back pain 6/10, pelvic pain 10/10  Vitals:   10/26/19 1039  BP: 133/89  Pulse: 88  Resp: 18  Temp: 98.6 F (37 C)  SpO2: 98%     FHT: 161  Lab orders placed from triage: UA

## 2019-10-26 NOTE — Discharge Instructions (Signed)
Abdominal Pain During Pregnancy  Abdominal pain is common during pregnancy, and has many possible causes. Some causes are more serious than others, and sometimes the cause is not known. Abdominal pain can be a sign that labor is starting. It can also be caused by normal growth and stretching of muscles and ligaments during pregnancy. Always tell your health care provider if you have any abdominal pain. Follow these instructions at home:  Do not have sex or put anything in your vagina until your pain goes away completely.  Get plenty of rest until your pain improves.  Drink enough fluid to keep your urine pale yellow.  Take over-the-counter and prescription medicines only as told by your health care provider.  Keep all follow-up visits as told by your health care provider. This is important. Contact a health care provider if:  Your pain continues or gets worse after resting.  You have lower abdominal pain that: ? Comes and goes at regular intervals. ? Spreads to your back. ? Is similar to menstrual cramps.  You have pain or burning when you urinate. Get help right away if:  You have a fever or chills.  You have vaginal bleeding.  You are leaking fluid from your vagina.  You are passing tissue from your vagina.  You have vomiting or diarrhea that lasts for more than 24 hours.  Your baby is moving less than usual.  You feel very weak or faint.  You have shortness of breath.  You develop severe pain in your upper abdomen. Summary  Abdominal pain is common during pregnancy, and has many possible causes.  If you experience abdominal pain during pregnancy, tell your health care provider right away.  Follow your health care provider's home care instructions and keep all follow-up visits as directed. This information is not intended to replace advice given to you by your health care provider. Make sure you discuss any questions you have with your health care  provider. Document Revised: 01/17/2019 Document Reviewed: 01/01/2017 Elsevier Patient Education  2020 Else[employer] Inc.   Back Pain in Pregnancy Back pain during pregnancy is common. Back pain may be caused by several factors that are related to changes during your pregnancy. Follow these instructions at home: Managing pain, stiffness, and swelling      If directed, for sudden (acute) back pain, put ice on the painful area. ? Put ice in a plastic bag. ? Place a towel between your skin and the bag. ? Leave the ice on for 20 minutes, 2-3 times per day.  If directed, apply heat to the affected area before you exercise. Use the heat source that your health care provider recommends, such as a moist heat pack or a heating pad. ? Place a towel between your skin and the heat source. ? Leave the heat on for 20-30 minutes. ? Remove the heat if your skin turns bright red. This is especially important if you are unable to feel pain, heat, or cold. You may have a greater risk of getting burned.  If directed, massage the affected area. Activity  Exercise as told by your health care provider. Gentle exercise is the best way to prevent or manage back pain.  Listen to your body when lifting. If lifting hurts, ask for help or bend your knees. This uses your leg muscles instead of your back muscles.  Squat down when picking up something from the floor. Do not bend over.  Only use bed rest for short periods as told by  your health care provider. Bed rest should only be used for the most severe episodes of back pain. Standing, sitting, and lying down  Do not stand in one place for long periods of time.  Use good posture when sitting. Make sure your head rests over your shoulders and is not hanging forward. Use a pillow on your lower back if necessary.  Try sleeping on your side, preferably the left side, with a pregnancy support pillow or 1-2 regular pillows between your legs. ? If you have back pain  after a night's rest, your bed may be too soft. ? A firm mattress may provide more support for your back during pregnancy. General instructions  Do not wear high heels.  Eat a healthy diet. Try to gain weight within your health care provider's recommendations.  Use a maternity girdle, elastic sling, or back brace as told by your health care provider.  Take over-the-counter and prescription medicines only as told by your health care provider.  Work with a physical therapist or massage therapist to find ways to manage back pain. Acupuncture or massage therapy may be helpful.  Keep all follow-up visits as told by your health care provider. This is important. Contact a health care provider if:  Your back pain interferes with your daily activities.  You have increasing pain in other parts of your body. Get help right away if:  You develop numbness, tingling, weakness, or problems with the use of your arms or legs.  You develop severe back pain that is not controlled with medicine.  You have a change in bowel or bladder control.  You develop shortness of breath, dizziness, or you faint.  You develop nausea, vomiting, or sweating.  You have back pain that is a rhythmic, cramping pain similar to labor pains. Labor pain is usually 1-2 minutes apart, lasts for about 1 minute, and involves a bearing down feeling or pressure in your pelvis.  You have back pain and your water breaks or you have vaginal bleeding.  You have back pain or numbness that travels down your leg.  Your back pain developed after you fell.  You develop pain on one side of your back.  You see blood in your urine.  You develop skin blisters in the area of your back pain. Summary  Back pain may be caused by several factors that are related to changes during your pregnancy.  Follow instructions as told by your health care provider for managing pain, stiffness, and swelling.  Exercise as told by your health  care provider. Gentle exercise is the best way to prevent or manage back pain.  Take over-the-counter and prescription medicines only as told by your health care provider.  Keep all follow-up visits as told by your health care provider. This is important. This information is not intended to replace advice given to you by your health care provider. Make sure you discuss any questions you have with your health care provider. Document Revised: 01/18/2019 Document Reviewed: 03/17/2018 Elsevier Patient Education  [employer].  Round Ligament Pain  The round ligament is a cord of muscle and tissue that helps support the uterus. It can become a source of pain during pregnancy if it becomes stretched or twisted as the baby grows. The pain usually begins in the second trimester (13-28 weeks) of pregnancy, and it can come and go until the baby is delivered. It is not a serious problem, and it does not cause harm to the baby. Round ligament  pain is usually a short, sharp, and pinching pain, but it can also be a dull, lingering, and aching pain. The pain is felt in the lower side of the abdomen or in the groin. It usually starts deep in the groin and moves up to the outside of the hip area. The pain may occur when you:  Suddenly change position, such as quickly going from a sitting to standing position.  Roll over in bed.  Cough or sneeze.  Do physical activity. Follow these instructions at home:   Watch your condition for any changes.  When the pain starts, relax. Then try any of these methods to help with the pain: ? Sitting down. ? Flexing your knees up to your abdomen. ? Lying on your side with one pillow under your abdomen and another pillow between your legs. ? Sitting in a warm bath for 15-20 minutes or until the pain goes away.  Take over-the-counter and prescription medicines only as told by your health care provider.  Move slowly when you sit down or stand up.  Avoid long  walks if they cause pain.  Stop or reduce your physical activities if they cause pain.  Keep all follow-up visits as told by your health care provider. This is important. Contact a health care provider if:  Your pain does not go away with treatment.  You feel pain in your back that you did not have before.  Your medicine is not helping. Get help right away if:  You have a fever or chills.  You develop uterine contractions.  You have vaginal bleeding.  You have nausea or vomiting.  You have diarrhea.  You have pain when you urinate. Summary  Round ligament pain is felt in the lower abdomen or groin. It is usually a short, sharp, and pinching pain. It can also be a dull, lingering, and aching pain.  This pain usually begins in the second trimester (13-28 weeks). It occurs because the uterus is stretching with the growing baby, and it is not harmful to the baby.  You may notice the pain when you suddenly change position, when you cough or sneeze, or during physical activity.  Relaxing, flexing your knees to your abdomen, lying on one side, or taking a warm bath may help to get rid of the pain.  Get help from your health care provider if the pain does not go away or if you have vaginal bleeding, nausea, vomiting, diarrhea, or painful urination. This information is not intended to replace advice given to you by your health care provider. Make sure you discuss any questions you have with your health care provider. Document Revised: 03/17/2018 Document Reviewed: 03/17/2018 Elsevier Patient Education  2020 Elsevier Inc.  Vaginal Yeast Infection, Adult  Vaginal yeast infection is a condition that causes vaginal discharge as well as soreness, swelling, and redness (inflammation) of the vagina. This is a common condition. Some women get this infection frequently. What are the causes? This condition is caused by a change in the normal balance of the yeast (candida) and bacteria that  live in the vagina. This change causes an overgrowth of yeast, which causes the inflammation. What increases the risk? The condition is more likely to develop in women who:  Take antibiotic medicines.  Have diabetes.  Take birth control pills.  Are pregnant.  Douche often.  Have a weak body defense system (immune system).  Have been taking steroid medicines for a long time.  Frequently wear tight clothing. What are  the signs or symptoms? Symptoms of this condition include:  White, thick, creamy vaginal discharge.  Swelling, itching, redness, and irritation of the vagina. The lips of the vagina (vulva) may be affected as well.  Pain or a burning feeling while urinating.  Pain during sex. How is this diagnosed? This condition is diagnosed based on:  Your medical history.  A physical exam.  A pelvic exam. Your health care provider will examine a sample of your vaginal discharge under a microscope. Your health care provider may send this sample for testing to confirm the diagnosis. How is this treated? This condition is treated with medicine. Medicines may be over-the-counter or prescription. You may be told to use one or more of the following:  Medicine that is taken by mouth (orally).  Medicine that is applied as a cream (topically).  Medicine that is inserted directly into the vagina (suppository). Follow these instructions at home:  Lifestyle  Do not have sex until your health care provider approves. Tell your sex partner that you have a yeast infection. That person should go to his or her health care provider and ask if they should also be treated.  Do not wear tight clothes, such as pantyhose or tight pants.  Wear breathable cotton underwear. General instructions  Take or apply over-the-counter and prescription medicines only as told by your health care provider.  Eat more yogurt. This may help to keep your yeast infection from returning.  Do not use  tampons until your health care provider approves.  Try taking a sitz bath to help with discomfort. This is a warm water bath that is taken while you are sitting down. The water should only come up to your hips and should cover your buttocks. Do this 3-4 times per day or as told by your health care provider.  Do not douche.  If you have diabetes, keep your blood sugar levels under control.  Keep all follow-up visits as told by your health care provider. This is important. Contact a health care provider if:  You have a fever.  Your symptoms go away and then return.  Your symptoms do not get better with treatment.  Your symptoms get worse.  You have new symptoms.  You develop blisters in or around your vagina.  You have blood coming from your vagina and it is not your menstrual period.  You develop pain in your abdomen. Summary  Vaginal yeast infection is a condition that causes discharge as well as soreness, swelling, and redness (inflammation) of the vagina.  This condition is treated with medicine. Medicines may be over-the-counter or prescription.  Take or apply over-the-counter and prescription medicines only as told by your health care provider.  Do not douche. Do not have sex or use tampons until your health care provider approves.  Contact a health care provider if your symptoms do not get better with treatment or your symptoms go away and then return. This information is not intended to replace advice given to you by your health care provider. Make sure you discuss any questions you have with your health care provider. Document Revised: 04/29/2019 Document Reviewed: 02/15/2018 Elsevier Patient Education  2020 Elsevier Inc.  Pyelonephritis During Pregnancy  What are the causes? This condition is caused by a bacterial infection in the lower urinary tract that spreads to the kidney. What increases the risk? You are more likely to develop this condition if:  You  have diabetes.  You have a history of frequent urinary tract  infections. What are the signs or symptoms? Symptoms of this condition may begin with symptoms of a lower urinary tract infection. These may include:  A frequent urge to pass urine.  Burning pain when passing urine.  Pain and pressure in your lower abdomen.  Blood in your urine.  Cloudy or smelly urine. As the infection spreads to your kidney, you may have these symptoms:  Fever.  Chills.  Pain and tenderness in your upper abdomen or in your back and sides (flank pain). Flank pain often affects one side of the body, usually the right side.  Nausea, vomiting, or loss of appetite. How is this diagnosed? This condition may be diagnosed based on:  Your symptoms and medical history.  A physical exam.  Tests to confirm the diagnosis. These may include: ? Blood tests to check kidney function and look for signs of infection. ? Urine tests to check for signs of infection, including bacteria, white or red blood cells, and protein. ? Tests to grow and identify the type of bacteria that is causing the infection (urine culture). ? Imaging studies of your kidneys to learn more about your condition. How is this treated? This condition is treated in the hospital with antibiotics that are given into one of your veins through an IV. Your health care provider:  Will start you on an antibiotic that is effective against common urinary tract infections.  May switch to another antibiotic if the results of your urine culture show that your infection is caused by different bacteria.  Will be careful to choose antibiotics that are the safest during pregnancy. Other treatments may include:  IV fluids if you are nauseous and not able to drink fluids.  Pain and fever medicines.  Medicines for nausea and vomiting. You will be able to go home when your infection is under control. To prevent another infection, you may need to continue  taking antibiotics by mouth until your baby is born. Follow these instructions at home: Medicines   Take over-the-counter and prescription medicines only as told by your health care provider.  Take your antibiotic medicine as told by your health care provider. Do not stop taking the antibiotic even if you start to feel better.  Continue to take your prenatal vitamins. Lifestyle   Follow instructions from your health care provider about eating or drinking restrictions. You may need to avoid: ? Foods and drinks with added sugar. ? Caffeine and fruit juice.  Drink enough fluid to keep your urine pale yellow.  Go to the bathroom frequently. Do not hold your urine. Try to empty your bladder completely.  Change your underwear every day. Wear all-cotton underwear. Do not wear tight underwear or pants. General instructions   Take these steps to lower the risk of bacteria getting into your urinary tract: ? Use liquid soap instead of bar soap when showering or bathing. Bacteria can grow on bar soap. ? When you wash yourself, clean the urethra opening first. Use a washcloth to clean the area between your vagina and anus. Pat the area dry with a clean towel. ? Wash your hands before and after you go to the bathroom. ? Wipe yourself from front to back after going to the bathroom. ? Do not use douches, perfumed soap, creams, or powders. ? Do not soak in a bath for more than 30 minutes.  Return to your normal activities as told by your health care provider. Ask your health care provider what activities are safe for you.  Keep all follow-up visits as told by your health care provider. This is important. Contact a health care provider if:  You have chills or a fever.  You have any symptoms of infection that do not get better at home.  Symptoms of infection come back.  You have a reaction or side effects from your antibiotic. Get help right away if:  You start having  contractions. Summary  Pyelonephritis is an infection of the kidney or kidneys.  This condition results when a bacterial infection in the lower urinary tract spreads to the kidney.  Lower urinary tract infections are common during pregnancy.  Pyelonephritis causes chills, a fever, flank pain, and nausea.  Pyelonephritis is a serious infection that is usually treated in the hospital with IV antibiotics. This information is not intended to replace advice given to you by your health care provider. Make sure you discuss any questions you have with your health care provider. Document Revised: 01/21/2019 Document Reviewed: 12/31/2017 Elsevier Patient Education  2020 [employer]  Preterm Labor and Birth Information  The normal length of a pregnancy is 39-41 weeks. Preterm labor is when labor starts before 37 completed weeks of pregnancy. What are the risk factors for preterm labor? Preterm labor is more likely to occur in women who:  Have certain infections during pregnancy such as a bladder infection, sexually transmitted infection, or infection inside the uterus (chorioamnionitis).  Have a shorter-than-normal cervix.  Have gone into preterm labor before.  Have had surgery on their cervix.  Are younger than age 31 or older than age 31.  Are African American.  Are pregnant with twins or multiple babies (multiple gestation).  Take street drugs or smoke while pregnant.  Do not gain enough weight while pregnant.  Became pregnant shortly after having been pregnant. What are the symptoms of preterm labor? Symptoms of preterm labor include:  Cramps similar to those that can happen during a menstrual period. The cramps may happen with diarrhea.  Pain in the abdomen or lower back.  Regular uterine contractions that may feel like tightening of the abdomen.  A feeling of increased pressure in the pelvis.  Increased watery or bloody mucus discharge from the vagina.  Water  breaking (ruptured amniotic sac). Why is it important to recognize signs of preterm labor? It is important to recognize signs of preterm labor because babies who are born prematurely may not be fully developed. This can put them at an increased risk for:  Long-term (chronic) heart and lung problems.  Difficulty immediately after birth with regulating body systems, including blood sugar, body temperature, heart rate, and breathing rate.  Bleeding in the brain.  Cerebral palsy.  Learning difficulties.  Death. These risks are highest for babies who are born before 34 weeks of pregnancy. How is preterm labor treated? Treatment depends on the length of your pregnancy, your condition, and the health of your baby. It may involve:  Having a stitch (suture) placed in your cervix to prevent your cervix from opening too early (cerclage).  Taking or being given medicines, such as: ? Hormone medicines. These may be given early in pregnancy to help support the pregnancy. ? Medicine to stop contractions. ? Medicines to help mature the baby's lungs. These may be prescribed if the risk of delivery is high. ? Medicines to prevent your baby from developing cerebral palsy. If the labor happens before 34 weeks of pregnancy, you may need to stay in the hospital. What should I do if I think I  am in preterm labor? If you think that you are going into preterm labor, call your health care provider right away. How can I prevent preterm labor in future pregnancies? To increase your chance of having a full-term pregnancy:  Do not use any tobacco products, such as cigarettes, chewing tobacco, and e-cigarettes. If you need help quitting, ask your health care provider.  Do not use street drugs or medicines that have not been prescribed to you during your pregnancy.  Talk with your health care provider before taking any herbal supplements, even if you have been taking them regularly.  Make sure you gain a  healthy amount of weight during your pregnancy.  Watch for infection. If you think that you might have an infection, get it checked right away.  Make sure to tell your health care provider if you have gone into preterm labor before. This information is not intended to replace advice given to you by your health care provider. Make sure you discuss any questions you have with your health care provider. Document Revised: 01/21/2019 Document Reviewed: 02/20/2016 Elsevier Patient Education  2020 [employer].  Second Trimester of Pregnancy The second trimester is from week 14 through week 27 (months 4 through 6). The second trimester is often a time when you feel your best. Your body has adjusted to being pregnant, and you begin to feel better physically. Usually, morning sickness has lessened or quit completely, you may have more energy, and you may have an increase in appetite. The second trimester is also a time when the fetus is growing rapidly. At the end of the sixth month, the fetus is about 9 inches long and weighs about 1 pounds. You will likely begin to feel the baby move (quickening) between 16 and 20 weeks of pregnancy. Body changes during your second trimester Your body continues to go through many changes during your second trimester. The changes vary from woman to woman.  Your weight will continue to increase. You will notice your lower abdomen bulging out.  You may begin to get stretch marks on your hips, abdomen, and breasts.  You may develop headaches that can be relieved by medicines. The medicines should be approved by your health care provider.  You may urinate more often because the fetus is pressing on your bladder.  You may develop or continue to have heartburn as a result of your pregnancy.  You may develop constipation because certain hormones are causing the muscles that push waste through your intestines to slow down.  You may develop hemorrhoids or swollen, bulging  veins (varicose veins).  You may have back pain. This is caused by: ? Weight gain. ? Pregnancy hormones that are relaxing the joints in your pelvis. ? A shift in weight and the muscles that support your balance.  Your breasts will continue to grow and they will continue to become tender.  Your gums may bleed and may be sensitive to brushing and flossing.  Dark spots or blotches (chloasma, mask of pregnancy) may develop on your face. This will likely fade after the baby is born.  A dark line from your belly button to the pubic area (linea nigra) may appear. This will likely fade after the baby is born.  You may have changes in your hair. These can include thickening of your hair, rapid growth, and changes in texture. Some women also have hair loss during or after pregnancy, or hair that feels dry or thin. Your hair will most likely return to  normal after your baby is born. What to expect at prenatal visits During a routine prenatal visit:  You will be weighed to make sure you and the fetus are growing normally.  Your blood pressure will be taken.  Your abdomen will be measured to track your baby's growth.  The fetal heartbeat will be listened to.  Any test results from the previous visit will be discussed. Your health care provider may ask you:  How you are feeling.  If you are feeling the baby move.  If you have had any abnormal symptoms, such as leaking fluid, bleeding, severe headaches, or abdominal cramping.  If you are using any tobacco products, including cigarettes, chewing tobacco, and electronic cigarettes.  If you have any questions. Other tests that may be performed during your second trimester include:  Blood tests that check for: ? Low iron levels (anemia). ? High blood sugar that affects pregnant women (gestational diabetes) between 17 and 28 weeks. ? Rh antibodies. This is to check for a protein on red blood cells (Rh factor).  Urine tests to check for  infections, diabetes, or protein in the urine.  An ultrasound to confirm the proper growth and development of the baby.  An amniocentesis to check for possible genetic problems.  Fetal screens for spina bifida and Down syndrome.  HIV (human immunodeficiency virus) testing. Routine prenatal testing includes screening for HIV, unless you choose not to have this test. Follow these instructions at home: Medicines  Follow your health care provider's instructions regarding medicine use. Specific medicines may be either safe or unsafe to take during pregnancy.  Take a prenatal vitamin that contains at least 600 micrograms (mcg) of folic acid.  If you develop constipation, try taking a stool softener if your health care provider approves. Eating and drinking   Eat a balanced diet that includes fresh fruits and vegetables, whole grains, good sources of protein such as meat, eggs, or tofu, and low-fat dairy. Your health care provider will help you determine the amount of weight gain that is right for you.  Avoid raw meat and uncooked cheese. These carry germs that can cause birth defects in the baby.  If you have low calcium intake from food, talk to your health care provider about whether you should take a daily calcium supplement.  Limit foods that are high in fat and processed sugars, such as fried and sweet foods.  To prevent constipation: ? Drink enough fluid to keep your urine clear or pale yellow. ? Eat foods that are high in fiber, such as fresh fruits and vegetables, whole grains, and beans. Activity  Exercise only as directed by your health care provider. Most women can continue their usual exercise routine during pregnancy. Try to exercise for 30 minutes at least 5 days a week. Stop exercising if you experience uterine contractions.  Avoid heavy lifting, wear low heel shoes, and practice good posture.  A sexual relationship may be continued unless your health care provider  directs you otherwise. Relieving pain and discomfort  Wear a good support bra to prevent discomfort from breast tenderness.  Take warm sitz baths to soothe any pain or discomfort caused by hemorrhoids. Use hemorrhoid cream if your health care provider approves.  Rest with your legs elevated if you have leg cramps or low back pain.  If you develop varicose veins, wear support hose. Elevate your feet for 15 minutes, 3-4 times a day. Limit salt in your diet. Prenatal Care  Write down your questions.  Take them to your prenatal visits.  Keep all your prenatal visits as told by your health care provider. This is important. Safety  Wear your seat belt at all times when driving.  Make a list of emergency phone numbers, including numbers for family, friends, the hospital, and police and fire departments. General instructions  Ask your health care provider for a referral to a local prenatal education class. Begin classes no later than the beginning of month 6 of your pregnancy.  Ask for help if you have counseling or nutritional needs during pregnancy. Your health care provider can offer advice or refer you to specialists for help with various needs.  Do not use hot tubs, steam rooms, or saunas.  Do not douche or use tampons or scented sanitary pads.  Do not cross your legs for long periods of time.  Avoid cat litter boxes and soil used by cats. These carry germs that can cause birth defects in the baby and possibly loss of the fetus by miscarriage or stillbirth.  Avoid all smoking, herbs, alcohol, and unprescribed drugs. Chemicals in these products can affect the formation and growth of the baby.  Do not use any products that contain nicotine or tobacco, such as cigarettes and e-cigarettes. If you need help quitting, ask your health care provider.  Visit your dentist if you have not gone yet during your pregnancy. Use a soft toothbrush to brush your teeth and be gentle when you  floss. Contact a health care provider if:  You have dizziness.  You have mild pelvic cramps, pelvic pressure, or nagging pain in the abdominal area.  You have persistent nausea, vomiting, or diarrhea.  You have a bad smelling vaginal discharge.  You have pain when you urinate. Get help right away if:  You have a fever.  You are leaking fluid from your vagina.  You have spotting or bleeding from your vagina.  You have severe abdominal cramping or pain.  You have rapid weight gain or weight loss.  You have shortness of breath with chest pain.  You notice sudden or extreme swelling of your face, hands, ankles, feet, or legs.  You have not felt your baby move in over an hour.  You have severe headaches that do not go away when you take medicine.  You have vision changes. Summary  The second trimester is from week 14 through week 27 (months 4 through 6). It is also a time when the fetus is growing rapidly.  Your body goes through many changes during pregnancy. The changes vary from woman to woman.  Avoid all smoking, herbs, alcohol, and unprescribed drugs. These chemicals affect the formation and growth your baby.  Do not use any tobacco products, such as cigarettes, chewing tobacco, and e-cigarettes. If you need help quitting, ask your health care provider.  Contact your health care provider if you have any questions. Keep all prenatal visits as told by your health care provider. This is important. This information is not intended to replace advice given to you by your health care provider. Make sure you discuss any questions you have with your health care provider. Document Revised: 01/21/2019 Document Reviewed: 11/04/2016 Elsevier Patient Education  2020 [employer].

## 2019-10-26 NOTE — MAU Note (Signed)
Pt states she drove herself today to MAU, RN cannot give dilaudid. NP informed, states she will adjust orders.

## 2019-10-26 NOTE — Telephone Encounter (Signed)
Pt called and reports she is in severe pain on the left side of her abdomen through to her back. Pt reports no bleeding or headache at this time. Pt reports she is having trouble standing due to the pain. I advised pt to go be evaluated at MAU, pt voices understanding.

## 2019-10-26 NOTE — MAU Provider Note (Signed)
History     CSN: [id number]  Arrival date and time: 10/26/19 1010   First Provider Initiated Contact with Patient 10/26/19 1158      Chief Complaint  Patient presents with  . Abdominal Pain  . Back Pain  . Pelvic Pain   Katelyn Mcfarland is a 29 y.o. [phone] at 45w5dwho presents to MAU for pain that wraps around her left-side extending across the pelvis/abdomen/low-mid back. Pt denies any history of a kidney stone.  Onset: 2days Location: per above Duration: 2days Character: progressively worsened over past two days, is difficult for patient to stand up and walk around, back pain is intermittent/sharp/tender, pelvic pain feels "swollen, tender, inflamed," abdominal pain is sharp Aggravating/Associated: bending down, standing up/pt reports suprapubic area is tender with urination, nausea x2days (pt reports vomiting this morning) Relieving: laying flat Treatment: Tylenol - did not help (last took last night) Severity: 10/10  Pt denies VB, vaginal discharge/odor/itching. Pt denies constipation, diarrhea, or urinary problems. Pt denies fever, chills, fatigue, sweating or changes in appetite. Pt denies SOB or chest pain. Pt denies dizziness, HA, light-headedness, weakness.  Problems this pregnancy include: hx of gHTN. Allergies? NKDA Current medications/supplements? PNVs, bASA, Zofran (last took yesterday), Tylenol PRN Prenatal care provider? [facility], next appt 11/07/2019    OB History    Gravida  7   Para  3   Term  3   Preterm  0   AB  3   Living  3     SAB  2   TAB  1   Ectopic  0   Multiple  0   Live Births  3           Past Medical History:  Diagnosis Date  . Allergy   . Chlamydia    age 29yo hx/o trichomonas age 29yo . Chronic headache   . Depression    doing ok, declined meds  . High cholesterol   . Infection    UTI  . Vaginal Pap smear, abnormal    bx, cryo    Past Surgical History:  Procedure Laterality Date  . INDUCED  ABORTION    . TUBAL LIGATION Bilateral 11/15/2018   Procedure: POST PARTUM TUBAL LIGATION;  Surgeon: EChancy Milroy MD;  [facility]  Service: Gynecology;  Laterality: Bilateral;  . WISDOM TOOTH EXTRACTION      Family History  Problem Relation Age of Onset  . Hypertension Mother   . Diabetes Mother   . Asthma Sister   . Cancer Paternal Grandmother   . Cancer Paternal Grandfather   . Cancer Maternal Grandmother   . Kidney disease Maternal Grandmother   . Diabetes Maternal Grandmother   . Heart disease Neg Hx   . Stroke Neg Hx     Social History   Tobacco Use  . Smoking status: Former Smoker    Packs/day: 1.00    Years: 16.00    Pack years: 16.00    Types: Cigarettes    Start date: 10/13/2000  . Smokeless tobacco: Never Used  . Tobacco comment: stopped August 2020  Substance Use Topics  . Alcohol use: Not Currently    Comment: not currently, socially  . Drug use: Yes    Types: Marijuana    Comment: last was october 2020    Allergies: No Known Allergies  Medications Prior to Admission  Medication Sig Dispense Refill Last Dose  . acetaminophen (TYLENOL) 500 MG tablet Take 1,000 mg by mouth every 6 (  six) hours as needed for mild pain.   10/25/2019 at Unknown time  . aspirin EC 81 MG tablet Take 1 tablet (81 mg total) by mouth daily. 30 tablet 5 10/25/2019 at Unknown time  . metroNIDAZOLE (METROGEL) 0.75 % vaginal gel Place 1 Applicatorful vaginally at bedtime. Apply one applicatorful to vagina at bedtime for 5 days 70 g 1 10/25/2019 at Unknown time  . ondansetron (ZOFRAN-ODT) 4 MG disintegrating tablet Take 1 tablet (4 mg total) by mouth every 8 (eight) hours as needed for nausea or vomiting. 20 tablet 0 10/25/2019 at Unknown time  . Prenatal Vit-Fe Phos-FA-Omega (VITAFOL GUMMIES) 3.33-0.333-34.8 MG CHEW Chew 1 tablet by mouth daily. 90 tablet 3 10/26/2019 at Unknown time  . promethazine (PHENERGAN) 25 MG tablet Take 0.5-1 tablets (12.5-25 mg total) by mouth  every 6 (six) hours as needed for nausea. 30 tablet 2 Past Month at Unknown time  . Blood Pressure Monitoring (BLOOD PRESSURE KIT) DEVI 1 kit by Does not apply route once a week. Check BP regularly and record readings into the Babyscripts App. Large Cuff. DX: O90.0 1 each 0   . ondansetron (ZOFRAN) 4 MG tablet Take 1 tablet (4 mg total) by mouth every 8 (eight) hours as needed for nausea or vomiting. 20 tablet 0     Review of Systems  Constitutional: Negative for chills, diaphoresis, fatigue and fever.  Eyes: Negative for visual disturbance.  Respiratory: Negative for shortness of breath.   Cardiovascular: Negative for chest pain.  Gastrointestinal: Positive for abdominal pain (left sided). Negative for constipation, diarrhea, nausea and vomiting.  Genitourinary: Positive for pelvic pain (left sided). Negative for dysuria, flank pain, frequency, urgency, vaginal bleeding and vaginal discharge.  Musculoskeletal: Positive for back pain (left sided).  Neurological: Negative for dizziness, weakness, light-headedness and headaches.   Physical Exam   Blood pressure 133/89, pulse 88, temperature 98.6 F (37 C), temperature source Oral, resp. rate 18, last menstrual period 07/08/2019, SpO2 98 %, not currently breastfeeding.  Patient Vitals for the past 24 hrs:  BP Temp Temp src Pulse Resp SpO2  10/26/19 1039 133/89 98.6 F (37 C) Oral 88 18 98 %   Physical Exam  Constitutional: She is oriented to person, place, and time. She appears well-developed and well-nourished. No distress.  HENT:  Head: Normocephalic and atraumatic.  Respiratory: Effort normal.  GI: Soft. She exhibits no distension and no mass. There is abdominal tenderness in the left upper quadrant and left lower quadrant. There is CVA tenderness (left-sided). There is no rebound and no guarding.  Genitourinary: There is no rash, tenderness or lesion on the right labia. There is no rash, tenderness or lesion on the left labia. Cervix  exhibits no motion tenderness, no discharge and no friability.    Vaginal discharge (thick, white, clumped discharge coating walls of vagina.) present.     No vaginal tenderness or bleeding.  No tenderness or bleeding in the vagina.    Genitourinary Comments: CE: long/closed/posterior -pt states pain not worse or changed with pelvic exam   Neurological: She is alert and oriented to person, place, and time.  Skin: Skin is warm and dry. She is not diaphoretic.  Psychiatric: She has a normal mood and affect. Her behavior is normal. Judgment and thought content normal.   Results for orders placed or performed during the hospital encounter of 10/26/19 (from the past 24 hour(s))  Urinalysis, Routine w reflex microscopic     Status: None   Collection Time: 10/26/19 10:40 AM  Result Value Ref Range   Color, Urine YELLOW YELLOW   APPearance CLEAR CLEAR   Specific Gravity, Urine 1.012 1.005 - 1.030   pH 6.0 5.0 - 8.0   Glucose, UA NEGATIVE NEGATIVE mg/dL   Hgb urine dipstick NEGATIVE NEGATIVE   Bilirubin Urine NEGATIVE NEGATIVE   Ketones, ur NEGATIVE NEGATIVE mg/dL   Protein, ur NEGATIVE NEGATIVE mg/dL   Nitrite NEGATIVE NEGATIVE   Leukocytes,Ua NEGATIVE NEGATIVE  CBC with Differential/Platelet     Status: Abnormal   Collection Time: 10/26/19 12:10 PM  Result Value Ref Range   WBC 7.5 4.0 - 10.5 K/uL   RBC 4.18 3.87 - 5.11 MIL/uL   Hemoglobin 12.0 12.0 - 15.0 g/dL   HCT 35.9 (L) 36.0 - 46.0 %   MCV 85.9 80.0 - 100.0 fL   MCH 28.7 26.0 - 34.0 pg   MCHC 33.4 30.0 - 36.0 g/dL   RDW 14.3 11.5 - 15.5 %   Platelets 211 150 - 400 K/uL   nRBC 0.0 0.0 - 0.2 %   Neutrophils Relative % 70 %   Neutro Abs 5.3 1.7 - 7.7 K/uL   Lymphocytes Relative 22 %   Lymphs Abs 1.7 0.7 - 4.0 K/uL   Monocytes Relative 6 %   Monocytes Absolute 0.5 0.1 - 1.0 K/uL   Eosinophils Relative 1 %   Eosinophils Absolute 0.1 0.0 - 0.5 K/uL   Basophils Relative 0 %   Basophils Absolute 0.0 0.0 - 0.1 K/uL   Immature  Granulocytes 1 %   Abs Immature Granulocytes 0.04 0.00 - 0.07 K/uL  Comprehensive metabolic panel     Status: Abnormal   Collection Time: 10/26/19 12:10 PM  Result Value Ref Range   Sodium 137 135 - 145 mmol/L   Potassium 4.1 3.5 - 5.1 mmol/L   Chloride 106 98 - 111 mmol/L   CO2 25 22 - 32 mmol/L   Glucose, Bld 89 70 - 99 mg/dL   BUN <5 (L) 6 - 20 mg/dL   Creatinine, Ser 0.64 0.44 - 1.00 mg/dL   Calcium 9.1 8.9 - 10.3 mg/dL   Total Protein 6.4 (L) 6.5 - 8.1 g/dL   Albumin 3.3 (L) 3.5 - 5.0 g/dL   AST 15 15 - 41 U/L   ALT 11 0 - 44 U/L   Alkaline Phosphatase 36 (L) 38 - 126 U/L   Total Bilirubin 0.4 0.3 - 1.2 mg/dL   GFR calc non Af Amer >60 >60 mL/min   GFR calc Af Amer >60 >60 mL/min   Anion gap 6 5 - 15  Wet prep, genital     Status: None   Collection Time: 10/26/19  3:15 PM   Specimen: PATH Cytology Cervicovaginal Ancillary Only  Result Value Ref Range   Yeast Wet Prep HPF POC NONE SEEN NONE SEEN   Trich, Wet Prep NONE SEEN NONE SEEN   Clue Cells Wet Prep HPF POC NONE SEEN NONE SEEN   WBC, Wet Prep HPF POC NONE SEEN NONE SEEN   Sperm NONE SEEN    US RENAL  Result Date: 10/26/2019 CLINICAL DATA:  CVA tenderness.  Pregnancy. EXAM: RENAL / URINARY TRACT ULTRASOUND COMPLETE COMPARISON:  CT 04/02/2012 FINDINGS: Right Kidney: Renal measurements: 9.5 x 3.4 x 5.8 cm = volume: 98 mL . Echogenicity within normal limits. No mass or hydronephrosis visualized. Left Kidney: Renal measurements: 10.4 x 6.2 x 5.5 cm = volume: 186 mL. Echogenicity within normal limits. No mass or hydronephrosis visualized. Bladder: Appears normal for degree  of bladder distention. Other: None. IMPRESSION: Negative for obstructive uropathy. Electronically Signed   By: Davina Poke D.O.   On: 10/26/2019 14:03   MAU Course  Procedures  MDM -left-sided pelvic/abdominal pain with +CVA tenderness left side -temp 98.6 -UA: WNL, sending urine for culture based on symptoms -CBC: WNL, WBCs 7.5 -CMP: WNL -Renal  US: normal -ibuprofen 669m + Zofran ODT given, pt reports nausea has resolved, back and abdominal pain now 2/10 and pelvic pain now 5/10 -consulted with Dr. PIlda Basset recommends performing pelvic exam to confirm cervix is closed and collect swabs, and, if normal, pt can be discharged to home. -when provider entered the room to perform pelvic exam, pt stated the pain was preventing her from getting back into bed after using the restroom. Pt reports she has never experienced this pain before, but identified pain as sharp and shooting in pelvis with movement and standing up. Limited OB [place]. -pelvic exam grossly normal with exception of visible yeast, will treat -CE: long/closed/posterior -[place] WNL, CL 4.51cm -WetPrep: +yeast, +ClueCells (pt already on tx for BV) -GC/CT collected -pt discharged to home in stable condition  Orders Placed This Encounter  Procedures  . Culture, OB Urine    Standing Status:   Standing    Number of Occurrences:   1  . Wet prep, genital    Standing Status:   Standing    Number of Occurrences:   1  . [place]    Standing Status:   Standing    Number of Occurrences:   1    Order Specific Question:   Symptom/Reason for Exam    Answer:   Costovertebral angle tenderness [[[id number]] . [place] OB LIMITED    Please measure cervical length.    Standing Status:   Standing    Number of Occurrences:   1    Order Specific Question:   Symptom/Reason for Exam    Answer:   Pelvic pain in pregnancy [[[id number]] . Urinalysis, Routine w reflex microscopic    Standing Status:   Standing    Number of Occurrences:   1  . CBC with Differential/Platelet    Standing Status:   Standing    Number of Occurrences:   1  . Comprehensive metabolic panel    Standing Status:   Standing    Number of Occurrences:   1  . Discharge patient    Order Specific Question:   Discharge disposition    Answer:   01-Home or Self Care [1]    Order Specific Question:   Discharge patient date     Answer:   10/26/2019   Meds ordered this encounter  Medications  . DISCONTD: ondansetron (ZOFRAN) 8 mg in sodium chloride 0.9 % 50 mL IVPB  . DISCONTD: HYDROmorphone (DILAUDID) injection 1 mg  . DISCONTD: lactated ringers infusion  . ondansetron (ZOFRAN-ODT) disintegrating tablet 8 mg  . ibuprofen (ADVIL) tablet 600 mg  . terconazole (TERAZOL 7) 0.4 % vaginal cream    Sig: Place 1 applicator vaginally at bedtime for 7 days.    Dispense:  45 g    Refill:  0    Order Specific Question:   Supervising Provider    Answer:   PAletha Halim[[id number] . cyclobenzaprine (FLEXERIL) 10 MG tablet    Sig: Take 1 tablet (10 mg total) by mouth 3 (three) times daily as needed.    Dispense:  30 tablet    Refill:  0    Order  Specific Question:   Supervising Provider    Answer:   Aletha Halim [[id number]]   Assessment and Plan   1. Costovertebral angle tenderness   2. Pelvic pain in pregnancy   3. Left sided abdominal pain   4. Round ligament pain   5. Yeast infection of the vagina    Allergies as of 10/26/2019   No Known Allergies     Medication List    TAKE these medications   acetaminophen 500 MG tablet Commonly known as: TYLENOL Take 1,000 mg by mouth every 6 (six) hours as needed for mild pain.   aspirin EC 81 MG tablet Take 1 tablet (81 mg total) by mouth daily.   Blood Pressure Kit Devi 1 kit by Does not apply route once a week. Check BP regularly and record readings into the Babyscripts App. Large Cuff. DX: O90.0   cyclobenzaprine 10 MG tablet Commonly known as: FLEXERIL Take 1 tablet (10 mg total) by mouth 3 (three) times daily as needed.   metroNIDAZOLE 0.75 % vaginal gel Commonly known as: METROGEL Place 1 Applicatorful vaginally at bedtime. Apply one applicatorful to vagina at bedtime for 5 days   ondansetron 4 MG disintegrating tablet Commonly known as: ZOFRAN-ODT Take 1 tablet (4 mg total) by mouth every 8 (eight) hours as needed for nausea or vomiting.    ondansetron 4 MG tablet Commonly known as: Zofran Take 1 tablet (4 mg total) by mouth every 8 (eight) hours as needed for nausea or vomiting.   promethazine 25 MG tablet Commonly known as: PHENERGAN Take 0.5-1 tablets (12.5-25 mg total) by mouth every 6 (six) hours as needed for nausea.   terconazole 0.4 % vaginal cream Commonly known as: TERAZOL 7 Place 1 applicator vaginally at bedtime for 7 days.   Vitafol Gummies 3.33-0.333-34.8 MG Chew Chew 1 tablet by mouth daily.      -will call with culture results, if positive +yeast, RX sent for terconazole, pt reports is currently on treatment for BV with MetroGel, pt advised to used yeast treatment after completing BV treatment -discussed using Flexeril and Tylenol 1070m PRN for pain, pt accepts RX for Flexeril -pt advised to return to [facility]U if pain worsens, urinary symptoms develop, pain unimproved by pain medication, N/V worsens, fever or other new symptoms develop -discussed round ligament pain -pt reports has Zofran at home -pt reports has pregnancy support belt at home -return MAU precautions given -pt discharged to home in stable condition  NElmyra RicksE Shoshanna Mcquitty 10/26/2019, 4:14 PM

## 2019-10-27 LAB — CULTURE, OB URINE: Culture: <10000 — AB

## 2019-10-27 LAB — GC/CHLAMYDIA PROBE AMP (~~LOC~~) NOT AT ARMC
Chlamydia: NEGATIVE
Comment: NEGATIVE
Comment: NORMAL
Neisseria Gonorrhea: NEGATIVE

## 2019-11-02 ENCOUNTER — Other Ambulatory Visit: Payer: Self-pay | Admitting: *Deleted

## 2019-11-02 DIAGNOSIS — O285 Abnormal chromosomal and genetic finding on antenatal screening of mother: Secondary | ICD-10-CM

## 2019-11-02 DIAGNOSIS — Z3480 Encounter for supervision of other normal pregnancy, unspecified trimester: Secondary | ICD-10-CM

## 2019-11-02 NOTE — Progress Notes (Signed)
Pt called to office for genetic results and discuss. Pt made aware of results and need for MFM consult.  Pt would like to have consults and discuss [facility] findings.  Referral has been entered and OB u/s has been changed to [facility] Detail for anatomy scan.

## 2019-11-07 ENCOUNTER — Other Ambulatory Visit: Payer: Self-pay

## 2019-11-07 ENCOUNTER — Ambulatory Visit (INDEPENDENT_AMBULATORY_CARE_PROVIDER_SITE_OTHER): Payer: Medicaid Other | Admitting: Advanced Practice Midwife

## 2019-11-07 VITALS — BP 139/83 | HR 99 | Wt 220.6 lb

## 2019-11-07 DIAGNOSIS — Z3480 Encounter for supervision of other normal pregnancy, unspecified trimester: Secondary | ICD-10-CM

## 2019-11-07 DIAGNOSIS — M5432 Sciatica, left side: Secondary | ICD-10-CM

## 2019-11-07 DIAGNOSIS — Z8759 Personal history of other complications of pregnancy, childbirth and the puerperium: Secondary | ICD-10-CM

## 2019-11-07 DIAGNOSIS — Z1379 Encounter for other screening for genetic and chromosomal anomalies: Secondary | ICD-10-CM

## 2019-11-07 DIAGNOSIS — Z3A17 17 weeks gestation of pregnancy: Secondary | ICD-10-CM

## 2019-11-07 DIAGNOSIS — O219 Vomiting of pregnancy, unspecified: Secondary | ICD-10-CM

## 2019-11-07 DIAGNOSIS — K117 Disturbances of salivary secretion: Secondary | ICD-10-CM

## 2019-11-07 MED ORDER — ONDANSETRON 4 MG PO TBDP: 4.0000 mg | ORAL_TABLET | Freq: Four times a day (QID) | ORAL | 3 refills | Status: DC | PRN

## 2019-11-07 MED ORDER — GLYCOPYRROLATE 2 MG PO TABS: 2.0000 mg | ORAL_TABLET | Freq: Three times a day (TID) | ORAL | 3 refills | Status: DC | PRN

## 2019-11-07 NOTE — Patient Instructions (Addendum)
Sciatica  Sciatica is pain, numbness, weakness, or tingling along the path of the sciatic nerve. The sciatic nerve starts in the lower back and runs down the back of each leg. The nerve controls the muscles in the lower leg and in the back of the knee. It also provides feeling (sensation) to the back of the thigh, the lower leg, and the sole of the foot. Sciatica is a symptom of another medical condition that pinches or puts pressure on the sciatic nerve. Sciatica most often only affects one side of the body. Sciatica usually goes away on its own or with treatment. In some cases, sciatica may come back (recur). What are the causes? This condition is caused by pressure on the sciatic nerve or pinching of the nerve. This may be the result of:  A disk in between the bones of the spine bulging out too far (herniated disk).  Age-related changes in the spinal disks.  A pain disorder that affects a muscle in the buttock.  Extra bone growth near the sciatic nerve.  A break (fracture) of the pelvis.  Pregnancy.  Tumor. This is rare. What increases the risk? The following factors may make you more likely to develop this condition:  Playing sports that place pressure or stress on the spine.  Having poor strength and flexibility.  A history of back injury or surgery.  Sitting for long periods of time.  Doing activities that involve repetitive bending or lifting.  Obesity. What are the signs or symptoms? Symptoms can vary from mild to very severe, and they may include:  Any of these problems in the lower back, leg, hip, or buttock: ? Mild tingling, numbness, or dull aches. ? Burning sensations. ? Sharp pains.  Numbness in the back of the calf or the sole of the foot.  Leg weakness.  Severe back pain that makes movement difficult. Symptoms may get worse when you cough, sneeze, or laugh, or when you sit or stand for long periods of time. How is this diagnosed? This condition may be  diagnosed based on:  Your symptoms and medical history.  A physical exam.  Blood tests.  Imaging tests, such as: ? X-rays. ? MRI. ? CT scan. How is this treated? In many cases, this condition improves on its own without treatment. However, treatment may include:  Reducing or modifying physical activity.  Exercising and stretching.  Icing and applying heat to the affected area.  Medicines that help to: ? Relieve pain and swelling. ? Relax your muscles.  Injections of medicines that help to relieve pain, irritation, and inflammation around the sciatic nerve (steroids).  Surgery. Follow these instructions at home: Medicines  Take over-the-counter and prescription medicines only as told by your health care provider.  Ask your health care provider if the medicine prescribed to you: ? Requires you to avoid driving or using heavy machinery. ? Can cause constipation. You may need to take these actions to prevent or treat constipation:  Drink enough fluid to keep your urine pale yellow.  Take over-the-counter or prescription medicines.  Eat foods that are high in fiber, such as beans, whole grains, and fresh fruits and vegetables.  Limit foods that are high in fat and processed sugars, such as fried or sweet foods. Managing pain      If directed, put ice on the affected area. ? Put ice in a plastic bag. ? Place a towel between your skin and the bag. ? Leave the ice on for 20 minutes,   2-3 times a day.  If directed, apply heat to the affected area. Use the heat source that your health care provider recommends, such as a moist heat pack or a heating pad. ? Place a towel between your skin and the heat source. ? Leave the heat on for 20-30 minutes. ? Remove the heat if your skin turns bright red. This is especially important if you are unable to feel pain, heat, or cold. You may have a greater risk of getting burned. Activity   Return to your normal activities as told  by your health care provider. Ask your health care provider what activities are safe for you.  Avoid activities that make your symptoms worse.  Take brief periods of rest throughout the day. ? When you rest for longer periods, mix in some mild activity or stretching between periods of rest. This will help to prevent stiffness and pain. ? Avoid sitting for long periods of time without moving. Get up and move around at least one time each hour.  Exercise and stretch regularly, as told by your health care provider.  Do not lift anything that is heavier than 10 lb (4.5 kg) while you have symptoms of sciatica. When you do not have symptoms, you should still avoid heavy lifting, especially repetitive heavy lifting.  When you lift objects, always use proper lifting technique, which includes: ? Bending your knees. ? Keeping the load close to your body. ? Avoiding twisting. General instructions  Maintain a healthy weight. Excess weight puts extra stress on your back.  Wear supportive, comfortable shoes. Avoid wearing high heels.  Avoid sleeping on a mattress that is too soft or too hard. A mattress that is firm enough to support your back when you sleep may help to reduce your pain.  Keep all follow-up visits as told by your health care provider. This is important. Contact a health care provider if:  You have pain that: ? Wakes you up when you are sleeping. ? Gets worse when you lie down. ? Is worse than you have experienced in the past. ? Lasts longer than 4 weeks.  You have an unexplained weight loss. Get help right away if:  You are not able to control when you urinate or have bowel movements (incontinence).  You have: ? Weakness in your lower back, pelvis, buttocks, or legs that gets worse. ? Redness or swelling of your back. ? A burning sensation when you urinate. Summary  Sciatica is pain, numbness, weakness, or tingling along the path of the sciatic nerve.  This condition  is caused by pressure on the sciatic nerve or pinching of the nerve.  Sciatica can cause pain, numbness, or tingling in the lower back, legs, hips, and buttocks.  Treatment often includes rest, exercise, medicines, and applying ice or heat. This information is not intended to replace advice given to you by your health care provider. Make sure you discuss any questions you have with your health care provider. Document Revised: 10/18/2018 Document Reviewed: 10/18/2018 Elsevier Patient Education  2020 [employer] Sciatica Rehab Ask your health care provider which exercises are safe for you. Do exercises exactly as told by your health care provider and adjust them as directed. It is normal to feel mild stretching, pulling, tightness, or discomfort as you do these exercises. Stop right away if you feel sudden pain or your pain gets worse. Do not begin these exercises until told by your health care provider. Stretching and range-of-motion exercises These exercises warm up   your muscles and joints and improve the movement and flexibility of your hips and back. These exercises also help to relieve pain, numbness, and tingling. Sciatic nerve glide 1. Sit in a chair with your head facing down toward your chest. Place your hands behind your back. Let your shoulders slump forward. 2. Slowly straighten one of your legs while you tilt your head back as if you are looking toward the ceiling. Only straighten your leg as far as you can without making your symptoms worse. 3. Hold this position for __________ seconds. 4. Slowly return to the starting position. 5. Repeat with your other leg. Repeat __________ times. Complete this exercise __________ times a day. Knee to chest with hip adduction and internal rotation  1. Lie on your back on a firm surface with both legs straight. 2. Bend one of your knees and move it up toward your chest until you feel a gentle stretch in your lower back and buttock. Then, move  your knee toward the shoulder that is on the opposite side from your leg. This is hip adduction and internal rotation. ? Hold your leg in this position by holding on to the front of your knee. 3. Hold this position for __________ seconds. 4. Slowly return to the starting position. 5. Repeat with your other leg. Repeat __________ times. Complete this exercise __________ times a day. Prone extension on elbows  1. Lie on your abdomen on a firm surface. A bed may be too soft for this exercise. 2. Prop yourself up on your elbows. 3. Use your arms to help lift your chest up until you feel a gentle stretch in your abdomen and your lower back. ? This will place some of your body weight on your elbows. If this is uncomfortable, try stacking pillows under your chest. ? Your hips should stay down, against the surface that you are lying on. Keep your hip and back muscles relaxed. 4. Hold this position for __________ seconds. 5. Slowly relax your upper body and return to the starting position. Repeat __________ times. Complete this exercise __________ times a day. Strengthening exercises These exercises build strength and endurance in your back. Endurance is the ability to use your muscles for a long time, even after they get tired. Pelvic tilt This exercise strengthens the muscles that lie deep in the abdomen. 1. Lie on your back on a firm surface. Bend your knees and keep your feet flat on the floor. 2. Tense your abdominal muscles. Tip your pelvis up toward the ceiling and flatten your lower back into the floor. ? To help with this exercise, you may place a small towel under your lower back and try to push your back into the towel. 3. Hold this position for __________ seconds. 4. Let your muscles relax completely before you repeat this exercise. Repeat __________ times. Complete this exercise __________ times a day. Alternating arm and leg raises  1. Get on your hands and knees on a firm surface.  If you are on a hard floor, you may want to use padding, such as an exercise mat, to cushion your knees. 2. Line up your arms and legs. Your hands should be directly below your shoulders, and your knees should be directly below your hips. 3. Lift your left leg behind you. At the same time, raise your right arm and straighten it in front of you. ? Do not lift your leg higher than your hip. ? Do not lift your arm higher than your shoulder. ?   Keep your abdominal and back muscles tight. ? Keep your hips facing the ground. ? Do not arch your back. ? Keep your balance carefully, and do not hold your breath. 4. Hold this position for __________ seconds. 5. Slowly return to the starting position. 6. Repeat with your right leg and your left arm. Repeat __________ times. Complete this exercise __________ times a day. Posture and body mechanics Good posture and healthy body mechanics can help to relieve stress in your body's tissues and joints. Body mechanics refers to the movements and positions of your body while you do your daily activities. Posture is part of body mechanics. Good posture means:  Your spine is in its natural S-curve position (neutral).  Your shoulders are pulled back slightly.  Your head is not tipped forward. Follow these guidelines to improve your posture and body mechanics in your everyday activities. Standing   When standing, keep your spine neutral and your feet about hip width apart. Keep a slight bend in your knees. Your ears, shoulders, and hips should line up.  When you do a task in which you stand in one place for a long time, place one foot up on a stable object that is 2-4 inches (5-10 cm) high, such as a footstool. This helps keep your spine neutral. Sitting   When sitting, keep your spine neutral and keep your feet flat on the floor. Use a footrest, if necessary, and keep your thighs parallel to the floor. Avoid rounding your shoulders, and avoid tilting your  head forward.  When working at a desk or a computer, keep your desk at a height where your hands are slightly lower than your elbows. Slide your chair under your desk so you are close enough to maintain good posture.  When working at a computer, place your monitor at a height where you are looking straight ahead and you do not have to tilt your head forward or downward to look at the screen. Resting  When lying down and resting, avoid positions that are most painful for you.  If you have pain with activities such as sitting, bending, stooping, or squatting, lie in a position in which your body does not bend very much. For example, avoid curling up on your side with your arms and knees near your chest (fetal position).  If you have pain with activities such as standing for a long time or reaching with your arms, lie with your spine in a neutral position and bend your knees slightly. Try the following positions: ? Lying on your side with a pillow between your knees. ? Lying on your back with a pillow under your knees. Lifting   When lifting objects, keep your feet at least shoulder width apart and tighten your abdominal muscles.  Bend your knees and hips and keep your spine neutral. It is important to lift using the strength of your legs, not your back. Do not lock your knees straight out.  Always ask for help to lift heavy or awkward objects. This information is not intended to replace advice given to you by your health care provider. Make sure you discuss any questions you have with your health care provider. Document Revised: 01/21/2019 Document Reviewed: 10/21/2018 Elsevier Patient Education  2020 [employer] Inc.  

## 2019-11-07 NOTE — Progress Notes (Signed)
   PRENATAL VISIT NOTE  Subjective:  Katelyn Mcfarland is a 29 y.o. [phone] at [redacted]w[redacted]d being seen today for ongoing prenatal care.  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  Patient reports pain down left leg after lying down.  Contractions: Not present. Vag. Bleeding: None.  Movement: Present. Denies leaking of fluid.   The following portions of the patient's history were reviewed and updated as appropriate: allergies, current medications, past family history, past medical history, past social history, past surgical history and problem list.   Objective:   Vitals:   11/07/19 1049  BP: 139/83  Pulse: 99  Weight: 220 lb 9.6 oz (100.1 kg)    Fetal Status: Fetal Heart Rate (bpm): 160   Movement: Present     General:  Alert, oriented and cooperative. Patient is in no acute distress.  Skin: Skin is warm and dry. No rash noted.   Cardiovascular: Normal heart rate noted  Respiratory: Normal respiratory effort, no problems with respiration noted  Abdomen: Soft, gravid, appropriate for gestational age.  Pain/Pressure: Absent     Pelvic: Cervical exam deferred        Extremities: Normal range of motion.  Edema: None  Mental Status: Normal mood and affect. Normal behavior. Normal judgment and thought content.   Assessment and Plan:  Pregnancy: [id number] at [redacted]w[redacted]d 1. Encounter for genetic screening for birth defect - AFP, Serum, Open Spina Bifida  2. Supervision of other normal pregnancy, antepartum --Anticipatory guidance about next  visits/weeks of pregnancy given. --Next visit in 4 weeks virtual  3. Sciatica of left side --Pain is when lying down or after sitting for long periods, radiates down outside of left leg, no calf pain or swelling. --Likely sciatica.  Recommend ice over heat, pregnancy support belt. Pt has her belt from last pregnancy. --Printed exercises for sciatica given, pt to let us know and PT can be ordered as needed. --Pt not working currently so is lying down most of the day.  Encouraged pt to stretch, walk some daily.  4. Ptyalism --Increased saliva contributing to n/v.  Will add robinul to regimen. - glycopyrrolate (ROBINUL) 2 MG tablet; Take 1 tablet (2 mg total) by mouth 3 (three) times daily as needed.  Dispense: 30 tablet; Refill: 3  5. Nausea and vomiting during pregnancy prior to [redacted] weeks gestation --Pt taking Zofran very occasionally but would prefer dissolvable tablets so rx sent. - ondansetron (ZOFRAN ODT) 4 MG disintegrating tablet; Take 1 tablet (4 mg total) by mouth every 6 (six) hours as needed for nausea.  Dispense: 20 tablet; Refill: 3  Preterm labor symptoms and general obstetric precautions including but not limited to vaginal bleeding, contractions, leaking of fluid and fetal movement were reviewed in detail with the patient. Please refer to After Visit Summary for other counseling recommendations.   No follow-ups on file.  Future Appointments  Date Time Provider Department Center  11/18/2019  9:30 AM [facility] [name] 1 [facility]  11/18/2019 10:30 AM [facility] GENETIC COUNSELING RM [facility]    Sharen Counter, CNM

## 2019-11-07 NOTE — Progress Notes (Signed)
Pt is here for ROB and AFP. [redacted]w[redacted]d.

## 2019-11-09 LAB — AFP, SERUM, OPEN SPINA BIFIDA
AFP MoM: 1.21
AFP Value: 42.1 ng/mL
Gest. Age on Collection Date: 17.4 weeks
Maternal Age At EDD: 31 yr
OSBR Risk 1 IN: 10000
Test Results:: NEGATIVE
Weight: 220 [lb_av]

## 2019-11-13 ENCOUNTER — Telehealth: Payer: Self-pay | Admitting: Obstetrics & Gynecology

## 2019-11-13 NOTE — Telephone Encounter (Signed)
[facility] OB/GYN Physician Phone Call Documentation  I received a call from Ward Chatters, with report of BP 150/83.  She is [redacted]w[redacted]d, h/o GTN last pregnancy. Had mild headache that resolved, no other symptoms. Other BPs 130s/70s.  Told to check again tomorrow, if still has SBP in 150s, she should call office as she may need to be started on Labetalol for [facility].  Severe HTN precautions reviewed.   Jaynie Collins, MD, FACOG Obstetrician & Gynecologist, [facility] for [employer], [facility]

## 2019-11-14 ENCOUNTER — Telehealth: Payer: Self-pay

## 2019-11-14 NOTE — Telephone Encounter (Signed)
Pt called and reports her BP is 150/92, retake was 157/79. Pt denies HA or blurry vision today but reports chest pain on and off this morning. I advised pt to go to hospital for evaluation, pt voices understanding.

## 2019-11-14 NOTE — Telephone Encounter (Signed)
Recvd call from [employer] with elevated BP reading for this patient, 150/93 and retake was 157/79 per [employer]. Called pt to see if she can retake and see if she is symptomatic, pt did not answer, LVM for pt to call back.

## 2019-11-17 ENCOUNTER — Telehealth: Payer: Self-pay

## 2019-11-17 NOTE — Telephone Encounter (Signed)
[employer] called to notify us of elevated BP for this patient of 144/84. I called pt to evaluate for symptoms. Pt rechecked BP, reports a reading of 114/73. Pt states she has had some headaches that have gone away with tylenol. I advised pt to continue checking BP and reporting to [employer] and to call us if she has elevated reading and/or symptoms of blurry vision, headache that isn't relieved with tylenol, swelling, or sharp abdominal pain. Pt voices understanding.

## 2019-11-18 ENCOUNTER — Other Ambulatory Visit (HOSPITAL_COMMUNITY): Payer: Self-pay | Admitting: *Deleted

## 2019-11-18 ENCOUNTER — Encounter (HOSPITAL_COMMUNITY): Payer: Medicaid Other

## 2019-11-18 ENCOUNTER — Ambulatory Visit (HOSPITAL_COMMUNITY)
Admission: RE | Admit: 2019-11-18 | Discharge: 2019-11-18 | Disposition: A | Discharge status: Home / Self Care | Payer: Medicaid Other | Source: Ambulatory Visit | Attending: Advanced Practice Midwife | Admitting: Advanced Practice Midwife

## 2019-11-18 ENCOUNTER — Other Ambulatory Visit: Payer: Self-pay

## 2019-11-18 DIAGNOSIS — O285 Abnormal chromosomal and genetic finding on antenatal screening of mother: Secondary | ICD-10-CM | POA: Diagnosis present

## 2019-11-18 DIAGNOSIS — Z3480 Encounter for supervision of other normal pregnancy, unspecified trimester: Secondary | ICD-10-CM | POA: Diagnosis not present

## 2019-11-18 DIAGNOSIS — Z3A19 19 weeks gestation of pregnancy: Secondary | ICD-10-CM

## 2019-11-18 DIAGNOSIS — O09292 Supervision of pregnancy with other poor reproductive or obstetric history, second trimester: Secondary | ICD-10-CM

## 2019-11-18 DIAGNOSIS — O99212 Obesity complicating pregnancy, second trimester: Secondary | ICD-10-CM

## 2019-11-18 DIAGNOSIS — Z362 Encounter for other antenatal screening follow-up: Secondary | ICD-10-CM

## 2019-11-21 ENCOUNTER — Ambulatory Visit (HOSPITAL_COMMUNITY): Payer: Medicaid Other

## 2019-11-21 ENCOUNTER — Telehealth: Payer: Self-pay | Admitting: Lactation Services

## 2019-11-21 NOTE — Telephone Encounter (Signed)
Pt called and LM that she received a call in regards to her Genetic Testing. She would like a call back. Forwarded to [employer]

## 2019-11-22 ENCOUNTER — Other Ambulatory Visit: Payer: Self-pay

## 2019-11-22 ENCOUNTER — Ambulatory Visit (HOSPITAL_COMMUNITY): Payer: Medicaid Other | Attending: Obstetrics and Gynecology | Admitting: Genetic Counselor

## 2019-11-22 DIAGNOSIS — Z315 Encounter for genetic counseling: Secondary | ICD-10-CM

## 2019-11-22 DIAGNOSIS — Z3A19 19 weeks gestation of pregnancy: Secondary | ICD-10-CM

## 2019-11-22 DIAGNOSIS — Z148 Genetic carrier of other disease: Secondary | ICD-10-CM

## 2019-11-22 DIAGNOSIS — D573 Sickle-cell trait: Secondary | ICD-10-CM

## 2019-11-22 NOTE — Progress Notes (Signed)
11/22/2019  ADILENY DELON August 09, 1991 MRN: [id number] DOV: 11/22/2019  I connected withMs.Dardenon2/9/21at9:00AM EDTbytelephoneand verified that I was speaking with the correct person using two identifiers. Ms. 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 Ms. Dooner presented to her appointment alone.  Indication for genetic counseling - Carrier for sickle cell disease - Increased risk to be silent carrier for spinal muscular atrophy  Prenatal history  Ms. Virag is a [id number], 29 y.o. female. Her current pregnancy has completed [redacted]w[redacted]d (Estimated Date of Delivery: 04/13/20).  Ms. Villela denied exposure to environmental toxins or chemical agents. She denied the use of alcohol, tobacco or street drugs. She reported taking prenatal vitamins, baby aspirin, glycopyrrolate, and phenergan. She denied significant viral illnesses, fevers, and bleeding during the course of her pregnancy. She has gestational hypertension. Her medical and surgical histories were otherwise noncontributory.  Family History  A three generation pedigree was drafted and reviewed. The family history is remarkable for the following:  - Ms. Rikard has a maternal half sister whose sons both have speech delay. However, Ms. Neiswonger believes that speech delay runs through the children's paternal side of the family.   - Ms. Diebold's paternal grandmother's sister has albinism. This relative's children also reportedly have albinism and developmental delays. There is no other history of albinism in the family. Because of this, the risk of albinism for Ms. Plancarte's children is likely low.  The remaining family histories were reviewed and found to be noncontributory for birth defects, intellectual disability, recurrent pregnancy loss, and known genetic conditions.    The patient's ethnicity is African  American. The father of the pregnancy's ethnicity is [place] American and Native American. Ashkenazi Jewish ancestry and consanguinity were denied. Pedigree will be scanned under Media.  Discussion  Ms. 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    We discussed that SCA is one condition in a group of blood disorders that affect hemoglobin in red blood cells (hemoglobinopathies). Hemoglobin is a protein that transports oxygen from the lungs to organs and tissues throughout the body. Individuals with SCA have an inherited structural abnormality in hemoglobin's beta globin chains due to a single amino acid change in the HBB gene. Instead of producing normal adult hemoglobin (Hgb A), individuals with SCA produce an atypical form of hemoglobin called hemoglobin S (Hgb S). Typically, individuals are expected to have two copies of Hgb A (Hgb AA). Individuals who are carriers of SCA have one copy of Hgb A and one copy of Hgb S (Hgb AS), whereas individuals affected by SCA have two copies of Hgb S (Hgb SS). Carriers of SCA are often said to have sickle cell "trait".   Hgb S alters the configuration of the hemoglobin molecule. As a result, individuals with SCA have red blood cells that can sickle and obstruct blood flow in small blood vessels, causing ischemia of tissues and organs and episodes of vaso-occlusive crisis. The amino acid change in the HBB gene also causes red blood cells to become fragile and break down easily, which results in chronic anemia. Additional complications associated with SCA may include organ damage, frequent infections, acute chest syndrome, ischemic stroke, splenic sequestration, priapism, and pulmonary hypertension. SCA is inherited in an autosomal recessive pattern, where  both parents must carry Hgb S trait to be at risk of  having an affected child. If Ms. Wierzba's partner were also a carrier of SCA, they would have a 1 in 4 (25%) chance of having a child with SCA.    Hgb S is just one variant form of hemoglobin caused by a mutations in the HBB gene. It is also possible that Ms. Aker's partner could carry another variant form of hemoglobin, such as hemoglobin C. If he did, the couple would have a 1 in 4 (25%) chance of having a child with hemoglobin [place] disease (HbSC disease). Individuals with HbSC disease have red blood cells that contain both hemoglobin S and hemoglobin C. These variant forms of hemoglobin can cause red blood cells to become rigid and sickle, blocking small blood vessels and making it difficult for the red blood cells to deliver oxygen to the body's tissues. This can cause severe pain and organ damage, just as we see in individuals with SCA. Individuals with HbSC disease are at risk of the same complications as those associated with SCA, such as pain crises, acute chest syndrome, infections, asplenia, and strokes; however, these complications may occur at a lesser frequency and may be milder.   Finally, Ms. Ishibashi's partner may have a different variant in the HBB gene that could make a carrier of beta-thalassemia. If he did, the couple would have a 1 in 4 (25%) chance of having a child with sickle beta thalassemia. The severity of sickle beta thalassemia depends on the normal amount of beta globin that is produced. If an individual produces no beta globin (sickle beta zero thalassemia), they will experience symptoms similar to SCA. If an individual produces a reduced amount of beta globin (sickle beta plus thalassemia), they may experience symptoms that are similar to a milder form of SCA. Given his ethnicity, Ms. Gutierres's partner has a 1 in 11 chance of carrying hemoglobin S trait, a 1 in 38 chance at carrying hemoglobin C trait, and a 1 in 75 chance of carrying beta-thalassemia.  Ms. 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 This puts her at increased risk (1 in 7) to be a silent 2+0 carrier for SMA. SMA is a condition caused by mutations in the SMN1 gene. Typically, individuals have two copies of the SMN1 gene, with one copy present on each chromosome. In SMA silent carriers, both copies of the SMN1 gene are present on one chromosome, with no copies of SMN1 present on the other chromosome.   SMA is characterized by progressive muscle weakness and atrophy due to degeneration and loss of anterior horn cells (lower motor neurons) in the spinal cord and brain stem. We discussed the different types of SMA (0, I, II, and III), including differences in severity and age of onset. We also reviewed the autosomal recessive inheritance pattern of SMA. We discussed that the couple only has a chance of having a child with SMA if both parents are identified as carriers for the condition. Based on the carrier frequency for SMA in the African American population, Ms. Scotto's partner currently has a 1 in 30 chance of being a carrier of SMA. Without partner screening to refine risk and based on her partner's ethnicity, the couple currently has a 1 in 8976 (0.01%) risk of having a child with SMA. If  Ms. Veltri's partner were found to have 2 copies of SMN1, his risk of being a carrier is reduced but not eliminated. However, if both parents are carriers of SMA, there is a 1 in 4 (25%) chance of having an affected fetus.   Ms. Marseille was informed that select hemoglobinopathies are included on [place]'s newborn screen, but that SMA currently is not included. We discussed the Early Check research study to add SMA to her baby's newborn screening panel free of charge. The Early Check study is able to identify infants affected with SMA much earlier than clinical diagnoses of SMA are  often made. This allows time to initiate treatment much earlier than it typically would be administered. Ms. Walraven indicated that she was interested in the Early Check study.  Ms. Tulloch's carrier screening was negative for the other 12 conditions screened. Thus, her risk to be a carrier for these additional conditions (listed separately in the laboratory report) has been reduced but not eliminated. This also significantly reduces her risk of having a child affected by one of these conditions. We discussed that carrier testing for HBB-related hemoglobinopathies and SMA is recommended for Ms. Schoening's partner. Ms. Gosa indicated that she is interested in pursuing partner carrier screening.  We also reviewed that Ms. Ruffini had Panorama NIPS through the laboratory [facility] that was low-risk for fetal aneuploidies. We reviewed that these results showed a less than 1 in 10,000 risk for trisomies 21, 18 and 13, and monosomy X (Turner syndrome). In addition, the risk for triploidy and sex chromosome trisomies (47,XXX and 47,XXY) was also low. Ms. Deshmukh elected to have cfDNA analysis for 22q11.2 deletion syndrome, which was also low risk (1 in 2900). We reviewed that while this testing identifies 94-99% of pregnancies with trisomy 5, trisomy 20, trisomy 5, sex chromosome aneuploidies, and triploidy, it is NOT diagnostic. A positive test result requires confirmation by CVS or amniocentesis, and a negative test result does not rule out a fetal chromosome abnormality. She also understands that this testing does not identify all genetic conditions.  A complete ultrasound was performed last week. The ultrasound report is sent under separate cover. There were no visualized fetal anomalies or markers suggestive of aneuploidy.  Ms. Judice was also counseled regarding diagnostic testing via amniocentesis. We discussed the technical aspects of the procedure and quoted up to a 1 in 500 (0.2%) risk for spontaneous  pregnancy loss or other adverse pregnancy outcomes as a result of amniocentesis. Cultured cells from an amniocentesis sample allow for the visualization of a fetal karyotype, which can detect >99% of chromosomal aberrations. Chromosomal microarray can also be performed to identify smaller deletions or duplications of fetal chromosomal material. Amniocentesis could also be performed to assess whether the baby is affected by SCA, HbSC disease, sickle beta thalassemia, or SMA. After careful consideration, Ms. Hosking declined amniocentesis at this time. She understands that amniocentesis is available at any point after 16 weeks of pregnancy and that she may opt to undergo the procedure at a later date should she change her mind.  Ms. Pangallo was interested in pursuing carrier screening for her partner, Baldo Ash. We made a plan for her to send me a photo of Carl's insurance card so that I could perform a benefits investigation to determine the expected out of pocket cost for partner carrier screening. If out of pocket cost is expected to be greater than $250, the laboratory [facility] offers a patient pay option of $250 for partner carrier screening. Once  I receive Carl's insurance information and complete his benefits investigation, I will contact the couple to facilitate partner carrier screening from there if still desired.  I counseled Ms. Mode regarding the above risks and available options.  The approximate face-to-face time with the genetic counselor was 35 minutes.  In summary:  Discussed carrier screening results and options for follow-up testing  Carrier for sickle cell disease  Increased risk to be silent carrier for spinal muscular atrophy  Desires partner carrier screening. Patient will send me her partner's insurance information for benefits investigation. I will facilitate testing from there  Reviewed low-risk NIPS result  Reduction in risk for Down syndrome,trisomy 18,trisomy 13, sex  chromosome aneuploidies, and 22q11.2deletion syndrome  Reviewed results of ultrasound  No fetal anomalies or markers seen  Reduction in risk for fetal aneuploidy  Offered additional testing and screening  Declined amniocentesis  Reviewed family history concerns   Gershon Crane, MS Genetic Counselor

## 2019-11-23 ENCOUNTER — Other Ambulatory Visit: Payer: Medicaid Other

## 2019-11-23 DIAGNOSIS — Z8759 Personal history of other complications of pregnancy, childbirth and the puerperium: Secondary | ICD-10-CM

## 2019-11-24 LAB — CBC
Hematocrit: 34.5 % (ref 34.0–46.6)
Hemoglobin: 11.5 g/dL (ref 11.1–15.9)
MCH: 29.1 pg (ref 26.6–33.0)
MCHC: 33.3 g/dL (ref 31.5–35.7)
MCV: 87 fL (ref 79–97)
Platelets: 197 10*3/uL (ref 150–450)
RBC: 3.95 x10E6/uL (ref 3.77–5.28)
RDW: 14.3 % (ref 11.7–15.4)
WBC: 9.5 10*3/uL (ref 3.4–10.8)

## 2019-11-24 LAB — COMPREHENSIVE METABOLIC PANEL
ALT: 7 IU/L (ref 0–32)
AST: 13 IU/L (ref 0–40)
Albumin/Globulin Ratio: 1.3 (ref 1.2–2.2)
Albumin: 3.4 g/dL — ABNORMAL LOW (ref 3.9–5.0)
Alkaline Phosphatase: 50 IU/L (ref 39–117)
BUN/Creatinine Ratio: 8 — ABNORMAL LOW (ref 9–23)
BUN: 6 mg/dL (ref 6–20)
Bilirubin Total: <0.2 mg/dL (ref 0.0–1.2)
CO2: 23 mmol/L (ref 20–29)
Calcium: 9.4 mg/dL (ref 8.7–10.2)
Chloride: 102 mmol/L (ref 96–106)
Creatinine, Ser: 0.71 mg/dL (ref 0.57–1.00)
GFR calc Af Amer: 132 mL/min/{1.73_m2} (ref >59)
GFR calc non Af Amer: 115 mL/min/{1.73_m2} (ref >59)
Globulin, Total: 2.7 g/dL (ref 1.5–4.5)
Glucose: 105 mg/dL — ABNORMAL HIGH (ref 65–99)
Potassium: 3.8 mmol/L (ref 3.5–5.2)
Sodium: 137 mmol/L (ref 134–144)
Total Protein: 6.1 g/dL (ref 6.0–8.5)

## 2019-11-24 LAB — PROTEIN / CREATININE RATIO, URINE
Creatinine, Urine: 91.2 mg/dL
Protein, Ur: 7.6 mg/dL
Protein/Creat Ratio: 83 mg/g creat (ref 0–200)

## 2019-12-05 ENCOUNTER — Telehealth (INDEPENDENT_AMBULATORY_CARE_PROVIDER_SITE_OTHER): Payer: Medicaid Other | Admitting: Obstetrics

## 2019-12-05 ENCOUNTER — Encounter: Payer: Self-pay | Admitting: Obstetrics

## 2019-12-05 ENCOUNTER — Ambulatory Visit (HOSPITAL_COMMUNITY): Payer: Self-pay | Admitting: Advanced Practice Midwife

## 2019-12-05 VITALS — BP 140/88 | HR 103

## 2019-12-05 DIAGNOSIS — O99210 Obesity complicating pregnancy, unspecified trimester: Secondary | ICD-10-CM

## 2019-12-05 DIAGNOSIS — O0990 Supervision of high risk pregnancy, unspecified, unspecified trimester: Secondary | ICD-10-CM

## 2019-12-05 DIAGNOSIS — Z3A21 21 weeks gestation of pregnancy: Secondary | ICD-10-CM

## 2019-12-05 DIAGNOSIS — Z8759 Personal history of other complications of pregnancy, childbirth and the puerperium: Secondary | ICD-10-CM

## 2019-12-05 DIAGNOSIS — O09292 Supervision of pregnancy with other poor reproductive or obstetric history, second trimester: Secondary | ICD-10-CM

## 2019-12-05 DIAGNOSIS — M549 Dorsalgia, unspecified: Secondary | ICD-10-CM

## 2019-12-05 MED ORDER — COMFORT FIT MATERNITY SUPP SM MISC: 0 refills | Status: DC

## 2019-12-05 NOTE — Progress Notes (Signed)
TELEHEALTH OBSTETRICS PRENATAL VIRTUAL VIDEO VISIT ENCOUNTER NOTE  Provider location: [place] [facility] [employer] [facility] [place]   I connected with Katelyn Mcfarland on 12/05/19 at  9:45 AM EST by [facility] MyChart Video Encounter at home and verified that I am speaking with the correct person using two identifiers.   I discussed the limitations, risks, security and privacy concerns of performing an evaluation and management service virtually and the availability of in person appointments. I also discussed with the patient that there may be a patient responsible charge related to this service. The patient expressed understanding and agreed to proceed. Subjective:  Katelyn Mcfarland is a 29 y.o. [phone] at [redacted]w[redacted]d being seen today for ongoing prenatal care.  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  Patient reports backache, heartburn and vaginal irritation.  Contractions: Not present. Vag. Bleeding: None.  Movement: Present. Denies any leaking of fluid.   The following portions of the patient's history were reviewed and updated as appropriate: allergies, current medications, past family history, past medical history, past social history, past surgical history and problem list.   Objective:   Vitals:   12/05/19 0950  BP: 140/88  Pulse: (!) 103    Fetal Status:     Movement: Present     General:  Alert, oriented and cooperative. Patient is in no acute distress.  Respiratory: Normal respiratory effort, no problems with respiration noted  Mental  Status: Normal mood and affect. Normal behavior. Normal judgment and thought content.  Rest of physical exam deferred due to type of encounter  Imaging: [name] MFM OB DETAIL +14 WK  Result Date: 11/18/2019 ----------------------------------------------------------------------  OBSTETRICS REPORT                       (Signed Final 11/18/2019 10:31 am) ---------------------------------------------------------------------- Patient Info  ID #:       [id number]                          D.O.B.:  [birth year] (28 yrs)  Name:       Katelyn [place]land                 Visit Date: 11/18/2019 09:17 am ---------------------------------------------------------------------- Performed By  Performed By:     Percell Boston          Referred By:      Marylen Ponto                    RDMS  Attending:        Noralee Space MD        [facility] ---------------------------------------------------------------------- Orders   #  Description  Code         Ordered By   1  [name] MFM OB DETAIL +14 WK              [id number]     KELLY DAVIS  ----------------------------------------------------------------------   #  Order #                    Accession #                 Episode #   1  [id number]                  [id number]                  [id number]  ---------------------------------------------------------------------- Indications   [redacted] weeks gestation of pregnancy                Z3A.19   Encounter for antenatal screening for          Z36.3   malformations   Low risk NIPS, 6.1FF, Neg AFP   Obesity complicating pregnancy, second         O99.212   trimester   Poor obstetric history: Previous               O09.299   preeclampsia / eclampsia/gestational HTN   History of sickle cell trait                   Z86.2  ---------------------------------------------------------------------- Vital Signs                                                  Height:        4'11" ---------------------------------------------------------------------- Fetal Evaluation  Num Of Fetuses:         1  Fetal Heart Rate(bpm):  145  Cardiac Activity:       Observed  Presentation:           Cephalic  Placenta:               Anterior  P. Cord Insertion:      Visualized, central  Amniotic Fluid  AFI FV:      Within normal limits                              Largest Pocket(cm)                              7.68 ---------------------------------------------------------------------- Biometry  BPD:      44.8  mm     G. Age:  19w 4d         74  %    CI:        75.99   %    70 - 86                                                          FL/HC:      17.0   %    16.1 - 18.3  HC:      162.9  mm  G. Age:  19w 0d         45  %    HC/AC:      1.12        1.09 - 1.39  AC:      145.4  mm     G. Age:  19w 6d         74  %    FL/BPD:     61.8   %  FL:       27.7  mm     G. Age:  18w 3d         25  %    FL/AC:      19.1   %    20 - 24  HUM:        30  mm     G. Age:  19w 6d         73  %  CER:      18.2  mm     G. Age:  18w 1d         23  %  NFT:       2.3  mm  CM:          6  mm  Est. FW:     280  gm    0 lb 10 oz      58  % ---------------------------------------------------------------------- OB History  Gravidity:    7         Term:   3         SAB:   2  TOP:          1        Living:  3 ---------------------------------------------------------------------- Gestational Age  LMP:           19w 0d        Date:  07/08/19                 EDD:   04/13/20  U/S Today:     19w 2d                                        EDD:   04/11/20  Best:          19w 0d     Det. By:  LMP  (07/08/19)          EDD:   04/13/20 ---------------------------------------------------------------------- Anatomy  Cranium:               Appears normal         LVOT:                   Appears normal  Cavum:                 Appears normal         Aortic Arch:            Not well visualized  Ventricles:            Appears normal          Ductal Arch:            Not well visualized  Choroid Plexus:        Appears normal         Diaphragm:              Appears normal  Cerebellum:            Appears normal         Stomach:                Appears normal, left                                                                        sided  Posterior Fossa:       Appears normal         Abdomen:                Appears normal  Nuchal Fold:           Appears normal         Abdominal Wall:         Appears nml (cord                                                                        insert, abd wall)  Face:                  Appears normal         Cord Vessels:           Appears normal (3                         (orbits and profile)                           vessel cord)  Lips:                  Appears normal         Kidneys:                Appear normal  Palate:                Not well visualized    Bladder:                Appears normal  Thoracic:              Appears normal         Spine:                  Appears normal  Heart:                 Appears normal         Upper Extremities:      Appears normal                         (4CH, axis, and                         situs)  RVOT:  Not well visualized    Lower Extremities:      Appears normal  Other:  Rt heel visualized. Nasal bone visualized. Technically difficult due to          maternal habitus and fetal position. ---------------------------------------------------------------------- Cervix Uterus Adnexa  Cervix  Length:              4  cm.  Normal appearance by transabdominal scan.  Uterus  No abnormality visualized.  Left Ovary  No adnexal mass visualized.  Right Ovary  No adnexal mass visualized.  Cul De Sac  No free fluid seen.  Adnexa  No abnormality visualized. ---------------------------------------------------------------------- Impression  We performed fetal anatomy scan. No makers of  aneuploidies or fetal structural defects are seen. Fetal  biometry is consistent with her  previously-established dates.  Amniotic fluid is normal and good fetal activity is seen.  Patient understands the limitations of ultrasound in detecting  fetal anomalies.  On cell-free fetal DNA screening, the risks of fetal  aneuploidies are not increased. MSAFP screening showed  low risk for open-neural tube defects.  History of gestational hypertension. Patient takes low-dose  aspirin prophylaxis.  Patient has sickle-cell trait and an increased carrier risk for  Spinal Muscular Atrophy. ---------------------------------------------------------------------- Recommendations  -An appointment was made for her to return in 4 weeks for  completion of fetal anatomy.  -Recommend genetic counseling. ----------------------------------------------------------------------                  Tama High, MD Electronically Signed Final Report   11/18/2019 10:31 am ----------------------------------------------------------------------   Assessment and Plan:  Pregnancy: [id number] at [redacted]w[redacted]d 1. Supervision of high risk pregnancy, antepartum  2. History of pregnancy induced hypertension - taking Baby ASA  3. Backache symptom Rx: - Elastic Bandages & Supports (COMFORT FIT MATERNITY SUPP SM) MISC; Wear as directed.  Dispense: 1 each; Refill: 0  4. Obesity in pregnancy   Preterm labor symptoms and general obstetric precautions including but not limited to vaginal bleeding, contractions, leaking of fluid and fetal movement were reviewed in detail with the patient. I discussed the assessment and treatment plan with the patient. The patient was provided an opportunity to ask questions and all were answered. The patient agreed with the plan and demonstrated an understanding of the instructions. The patient was advised to call back or seek an in-person office evaluation/go to [facility] at [facility] for any urgent or concerning symptoms. Please refer to After Visit Summary for other counseling recommendations.    I provided 10 minutes of face-to-face time during this encounter.  Return in about 3 days (around 12/08/2019) for Vaginitis.  BP management ( she should bring BP cuff with her ), HOB.  Future Appointments  Date Time Provider [place]  12/16/2019  9:30 AM [place] [facility]  12/16/2019  9:30 AM [facility]-MFC [name] 5 WH-MFCUS MFC-US    Baltazar Najjar, [place] for [facility] [place] [facility] 12/05/2019

## 2019-12-05 NOTE — Progress Notes (Signed)
S/w pt for virtual visit, pt reports fetal movement some pressure. Pt is unsure if BP is working properly, advised pt to bring cuff to next visit.

## 2019-12-06 ENCOUNTER — Telehealth: Payer: Self-pay | Admitting: Pharmacist

## 2019-12-06 NOTE — Telephone Encounter (Signed)
Contacted patient about progress with smoking cessation.  Patient is in good spirits and reports that she has not smoked since December. She is not on any tobacco cessation therapy.    She reports that she is confident in her ability to stay abstinent even after her due date (July 2021) as she does not have the urge to smoke anymore. We agreed that mid to late summer would be a good time to follow up.

## 2019-12-14 ENCOUNTER — Encounter: Payer: Self-pay | Admitting: Obstetrics and Gynecology

## 2019-12-14 ENCOUNTER — Ambulatory Visit (INDEPENDENT_AMBULATORY_CARE_PROVIDER_SITE_OTHER): Payer: Medicaid Other | Admitting: Obstetrics and Gynecology

## 2019-12-14 ENCOUNTER — Other Ambulatory Visit (HOSPITAL_COMMUNITY)
Admission: RE | Admit: 2019-12-14 | Discharge: 2019-12-14 | Disposition: A | Discharge status: Home / Self Care | Payer: Medicaid Other | Source: Ambulatory Visit | Attending: Obstetrics and Gynecology | Admitting: Obstetrics and Gynecology

## 2019-12-14 ENCOUNTER — Other Ambulatory Visit: Payer: Self-pay

## 2019-12-14 VITALS — BP 128/78 | HR 113 | Wt 228.0 lb

## 2019-12-14 DIAGNOSIS — O99212 Obesity complicating pregnancy, second trimester: Secondary | ICD-10-CM

## 2019-12-14 DIAGNOSIS — Z3480 Encounter for supervision of other normal pregnancy, unspecified trimester: Secondary | ICD-10-CM | POA: Insufficient documentation

## 2019-12-14 DIAGNOSIS — E669 Obesity, unspecified: Secondary | ICD-10-CM

## 2019-12-14 DIAGNOSIS — Z8759 Personal history of other complications of pregnancy, childbirth and the puerperium: Secondary | ICD-10-CM

## 2019-12-14 DIAGNOSIS — Z3A22 22 weeks gestation of pregnancy: Secondary | ICD-10-CM

## 2019-12-14 DIAGNOSIS — Z9851 Tubal ligation status: Secondary | ICD-10-CM

## 2019-12-14 NOTE — Progress Notes (Signed)
Patient reports fetal movement with a lot of pelvic and back pain, denies contractions. Pt brought BP cuff from home to compare readings, today 128/78, office cuff reading 113/73.

## 2019-12-14 NOTE — Progress Notes (Signed)
   PRENATAL VISIT NOTE  Subjective:  Katelyn Mcfarland is a 29 y.o. [phone] at [redacted]w[redacted]d being seen today for ongoing prenatal care.  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  Patient reports vaginitis.  Contractions: Not present. Vag. Bleeding: None.  Movement: Present. Denies leaking of fluid.   The following portions of the patient's history were reviewed and updated as appropriate: allergies, current medications, past family history, past medical history, past social history, past surgical history and problem list.   Objective:   Vitals:   12/14/19 0823  BP: 128/78  Pulse: (!) 113  Weight: 228 lb (103.4 kg)    Fetal Status: Fetal Heart Rate (bpm): 148   Movement: Present     General:  Alert, oriented and cooperative. Patient is in no acute distress.  Skin: Skin is warm and dry. No rash noted.   Cardiovascular: Normal heart rate noted  Respiratory: Normal respiratory effort, no problems with respiration noted  Abdomen: Soft, gravid, appropriate for gestational age.  Pain/Pressure: Present     Pelvic: Cervical exam deferred        Extremities: Normal range of motion.     Mental Status: Normal mood and affect. Normal behavior. Normal judgment and thought content.   Assessment and Plan:  Pregnancy: [id number] at [redacted]w[redacted]d 1. Supervision of other normal pregnancy, antepartum Patient is doing well Continue with pregnancy belt Vaginal swab collected Third trimester labs next visit Follow up ultrasound 3/5  2. Obesity affecting  pregnancy in second trimester   3. History of gestational hypertension Currently stable BP  4. Failed Tubal ligation Patient is contemplating options. Discussed salpingectomy  Preterm labor symptoms and general obstetric precautions including but not limited to vaginal bleeding, contractions, leaking of fluid and fetal movement were reviewed in detail with the patient. Please refer to After Visit Summary for other counseling recommendations.   Return in about 4 weeks (around 01/11/2020) for in person, ROB, Low risk, 2 hr glucola next visit.  Future Appointments  Date Time Provider Department Center  12/16/2019  9:30 AM [facility] NURSE [facility]  12/16/2019  9:30 AM [facility] [name] 5 [facility]    Catalina Antigua, MD

## 2019-12-15 LAB — CERVICOVAGINAL ANCILLARY ONLY
Bacterial Vaginitis (gardnerella): NEGATIVE
Candida Glabrata: NEGATIVE
Candida Vaginitis: POSITIVE — AB
Chlamydia: NEGATIVE
Comment: NEGATIVE
Comment: NEGATIVE
Comment: NEGATIVE
Comment: NEGATIVE
Comment: NEGATIVE
Comment: NORMAL
Neisseria Gonorrhea: NEGATIVE
Trichomonas: NEGATIVE

## 2019-12-16 ENCOUNTER — Other Ambulatory Visit (HOSPITAL_COMMUNITY): Payer: Self-pay | Admitting: *Deleted

## 2019-12-16 ENCOUNTER — Other Ambulatory Visit: Payer: Self-pay

## 2019-12-16 ENCOUNTER — Encounter (HOSPITAL_COMMUNITY): Payer: Self-pay

## 2019-12-16 ENCOUNTER — Ambulatory Visit (HOSPITAL_COMMUNITY)
Admission: RE | Admit: 2019-12-16 | Discharge: 2019-12-16 | Disposition: A | Discharge status: Home / Self Care | Payer: Medicaid Other | Source: Ambulatory Visit | Attending: Obstetrics and Gynecology | Admitting: Obstetrics and Gynecology

## 2019-12-16 ENCOUNTER — Ambulatory Visit (HOSPITAL_COMMUNITY): Payer: Medicaid Other | Admitting: *Deleted

## 2019-12-16 VITALS — BP 124/74 | HR 103 | Temp 97.4°F

## 2019-12-16 DIAGNOSIS — O0990 Supervision of high risk pregnancy, unspecified, unspecified trimester: Secondary | ICD-10-CM

## 2019-12-16 DIAGNOSIS — O99212 Obesity complicating pregnancy, second trimester: Secondary | ICD-10-CM

## 2019-12-16 DIAGNOSIS — Z362 Encounter for other antenatal screening follow-up: Secondary | ICD-10-CM | POA: Insufficient documentation

## 2019-12-16 DIAGNOSIS — Z3A23 23 weeks gestation of pregnancy: Secondary | ICD-10-CM

## 2019-12-16 DIAGNOSIS — O99213 Obesity complicating pregnancy, third trimester: Secondary | ICD-10-CM

## 2019-12-16 LAB — CULTURE, OB URINE

## 2019-12-16 LAB — URINE CULTURE, OB REFLEX

## 2019-12-16 MED ORDER — TERCONAZOLE 0.8 % VA CREA: 1.0000 | TOPICAL_CREAM | Freq: Every day | VAGINAL | 0 refills | Status: DC

## 2019-12-16 NOTE — Addendum Note (Signed)
Addended by: Catalina Antigua on: 12/16/2019 10:08 AM   Modules accepted: Orders

## 2019-12-29 ENCOUNTER — Encounter: Payer: Self-pay | Admitting: Advanced Practice Midwife

## 2020-01-11 ENCOUNTER — Encounter: Payer: Medicaid Other | Admitting: Obstetrics & Gynecology

## 2020-01-11 ENCOUNTER — Other Ambulatory Visit: Payer: Medicaid Other

## 2020-01-18 ENCOUNTER — Other Ambulatory Visit: Payer: Medicaid Other

## 2020-01-18 ENCOUNTER — Encounter: Payer: Self-pay | Admitting: Obstetrics

## 2020-01-18 ENCOUNTER — Other Ambulatory Visit: Payer: Self-pay

## 2020-01-18 ENCOUNTER — Ambulatory Visit (INDEPENDENT_AMBULATORY_CARE_PROVIDER_SITE_OTHER): Payer: Medicaid Other | Admitting: Obstetrics

## 2020-01-18 ENCOUNTER — Encounter: Payer: Medicaid Other | Admitting: Obstetrics & Gynecology

## 2020-01-18 VITALS — BP 123/89 | HR 103 | Wt 227.5 lb

## 2020-01-18 DIAGNOSIS — Z3482 Encounter for supervision of other normal pregnancy, second trimester: Secondary | ICD-10-CM

## 2020-01-18 DIAGNOSIS — Z3A27 27 weeks gestation of pregnancy: Secondary | ICD-10-CM

## 2020-01-18 DIAGNOSIS — Z3480 Encounter for supervision of other normal pregnancy, unspecified trimester: Secondary | ICD-10-CM

## 2020-01-18 NOTE — Progress Notes (Signed)
Subjective:  Katelyn Mcfarland is a 29 y.o. [phone] at [redacted]w[redacted]d being seen today for ongoing prenatal care.  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  Patient reports no complaints.  Contractions: Not present. Vag. Bleeding: None.  Movement: Present. Denies leaking of fluid.   The following portions of the patient's history were reviewed and updated as appropriate: allergies, current medications, past family history, past medical history, past social history, past surgical history and problem list. Problem list updated.  Objective:   Vitals:   01/18/20 0812  BP: 123/89  Pulse: (!) 103  Weight: 227 lb 8 oz (103.2 kg)    Fetal Status:     Movement: Present     General:  Alert, oriented and cooperative. Patient is in no acute distress.  Skin: Skin is warm and dry. No rash noted.   Cardiovascular: Normal heart rate noted  Respiratory: Normal respiratory effort, no problems with respiration noted  Abdomen: Soft, gravid, appropriate for gestational age. Pain/Pressure: Present     Pelvic:  Cervical exam deferred        Extremities: Normal range of motion.  Edema: Trace  Mental Status: Normal mood and affect. Normal behavior. Normal judgment and thought content.   Urinalysis:      Assessment and Plan:  Pregnancy: [id number] at [redacted]w[redacted]d  1. Supervision of other normal pregnancy, antepartum Rx: - Glucose Tolerance, 2 Hours w/1 Hour - CBC - RPR - HIV Antibody (routine testing w rflx)  Preterm labor symptoms and general obstetric precautions  including but not limited to vaginal bleeding, contractions, leaking of fluid and fetal movement were reviewed in detail with the patient. Please refer to After Visit Summary for other counseling recommendations.   Return in about 2 weeks (around 02/01/2020) for MyChart.   Brock Bad, MD  01/18/2020

## 2020-01-18 NOTE — Progress Notes (Signed)
Pt is here for ROB and 2 hr GTT, [redacted]w[redacted]d.

## 2020-01-19 LAB — CBC
Hematocrit: 34.2 % (ref 34.0–46.6)
Hemoglobin: 11.1 g/dL (ref 11.1–15.9)
MCH: 28.6 pg (ref 26.6–33.0)
MCHC: 32.5 g/dL (ref 31.5–35.7)
MCV: 88 fL (ref 79–97)
Platelets: 172 10*3/uL (ref 150–450)
RBC: 3.88 x10E6/uL (ref 3.77–5.28)
RDW: 13.9 % (ref 11.7–15.4)
WBC: 10.2 10*3/uL (ref 3.4–10.8)

## 2020-01-19 LAB — GLUCOSE TOLERANCE, 2 HOURS W/ 1HR
Glucose, 1 hour: 118 mg/dL (ref 65–179)
Glucose, 2 hour: 108 mg/dL (ref 65–152)
Glucose, Fasting: 78 mg/dL (ref 65–91)

## 2020-01-19 LAB — RPR: RPR Ser Ql: NONREACTIVE

## 2020-01-19 LAB — HIV ANTIBODY (ROUTINE TESTING W REFLEX): HIV Screen 4th Generation wRfx: NONREACTIVE

## 2020-01-20 ENCOUNTER — Ambulatory Visit (HOSPITAL_COMMUNITY)
Admission: RE | Admit: 2020-01-20 | Discharge: 2020-01-20 | Disposition: A | Discharge status: Home / Self Care | Payer: Medicaid Other | Source: Ambulatory Visit | Attending: Obstetrics and Gynecology | Admitting: Obstetrics and Gynecology

## 2020-01-20 ENCOUNTER — Other Ambulatory Visit: Payer: Self-pay

## 2020-01-20 ENCOUNTER — Ambulatory Visit (HOSPITAL_COMMUNITY): Payer: Medicaid Other | Admitting: *Deleted

## 2020-01-20 ENCOUNTER — Other Ambulatory Visit (HOSPITAL_COMMUNITY): Payer: Self-pay | Admitting: *Deleted

## 2020-01-20 VITALS — BP 112/74 | HR 110 | Temp 97.5°F

## 2020-01-20 DIAGNOSIS — O09299 Supervision of pregnancy with other poor reproductive or obstetric history, unspecified trimester: Secondary | ICD-10-CM | POA: Diagnosis not present

## 2020-01-20 DIAGNOSIS — O99213 Obesity complicating pregnancy, third trimester: Secondary | ICD-10-CM

## 2020-01-20 DIAGNOSIS — Z362 Encounter for other antenatal screening follow-up: Secondary | ICD-10-CM | POA: Diagnosis not present

## 2020-01-20 DIAGNOSIS — Z862 Personal history of diseases of the blood and blood-forming organs and certain disorders involving the immune mechanism: Secondary | ICD-10-CM

## 2020-01-20 DIAGNOSIS — O0990 Supervision of high risk pregnancy, unspecified, unspecified trimester: Secondary | ICD-10-CM

## 2020-01-20 DIAGNOSIS — Z3A28 28 weeks gestation of pregnancy: Secondary | ICD-10-CM

## 2020-02-01 ENCOUNTER — Inpatient Hospital Stay (HOSPITAL_COMMUNITY)
Admission: AD | Admit: 2020-02-01 | Discharge: 2020-02-01 | Disposition: A | Discharge status: Home / Self Care | Payer: Medicaid Other | Attending: Obstetrics & Gynecology | Admitting: Obstetrics & Gynecology

## 2020-02-01 ENCOUNTER — Other Ambulatory Visit: Payer: Self-pay

## 2020-02-01 ENCOUNTER — Encounter (HOSPITAL_COMMUNITY): Payer: Self-pay | Admitting: Obstetrics & Gynecology

## 2020-02-01 ENCOUNTER — Telehealth (INDEPENDENT_AMBULATORY_CARE_PROVIDER_SITE_OTHER): Payer: Medicaid Other | Admitting: Obstetrics

## 2020-02-01 ENCOUNTER — Encounter: Payer: Self-pay | Admitting: Obstetrics

## 2020-02-01 VITALS — BP 111/65 | HR 100

## 2020-02-01 DIAGNOSIS — R1031 Right lower quadrant pain: Secondary | ICD-10-CM | POA: Diagnosis not present

## 2020-02-01 DIAGNOSIS — Z7982 Long term (current) use of aspirin: Secondary | ICD-10-CM | POA: Insufficient documentation

## 2020-02-01 DIAGNOSIS — O479 False labor, unspecified: Secondary | ICD-10-CM

## 2020-02-01 DIAGNOSIS — Z87891 Personal history of nicotine dependence: Secondary | ICD-10-CM | POA: Diagnosis not present

## 2020-02-01 DIAGNOSIS — O99212 Obesity complicating pregnancy, second trimester: Secondary | ICD-10-CM

## 2020-02-01 DIAGNOSIS — O4703 False labor before 37 completed weeks of gestation, third trimester: Secondary | ICD-10-CM | POA: Diagnosis not present

## 2020-02-01 DIAGNOSIS — Z8759 Personal history of other complications of pregnancy, childbirth and the puerperium: Secondary | ICD-10-CM

## 2020-02-01 DIAGNOSIS — Z3A29 29 weeks gestation of pregnancy: Secondary | ICD-10-CM

## 2020-02-01 DIAGNOSIS — E669 Obesity, unspecified: Secondary | ICD-10-CM

## 2020-02-01 DIAGNOSIS — R079 Chest pain, unspecified: Secondary | ICD-10-CM

## 2020-02-01 DIAGNOSIS — O219 Vomiting of pregnancy, unspecified: Secondary | ICD-10-CM

## 2020-02-01 DIAGNOSIS — Z3480 Encounter for supervision of other normal pregnancy, unspecified trimester: Secondary | ICD-10-CM

## 2020-02-01 LAB — COMPREHENSIVE METABOLIC PANEL
ALT: 12 U/L (ref 0–44)
AST: 19 U/L (ref 15–41)
Albumin: 3.3 g/dL — ABNORMAL LOW (ref 3.5–5.0)
Alkaline Phosphatase: 72 U/L (ref 38–126)
Anion gap: 7 (ref 5–15)
BUN: <5 mg/dL — ABNORMAL LOW (ref 6–20)
CO2: 26 mmol/L (ref 22–32)
Calcium: 9.2 mg/dL (ref 8.9–10.3)
Chloride: 103 mmol/L (ref 98–111)
Creatinine, Ser: 0.69 mg/dL (ref 0.44–1.00)
GFR calc Af Amer: >60 mL/min (ref >60)
GFR calc non Af Amer: >60 mL/min (ref >60)
Glucose, Bld: 89 mg/dL (ref 70–99)
Potassium: 3.8 mmol/L (ref 3.5–5.1)
Sodium: 136 mmol/L (ref 135–145)
Total Bilirubin: 0.4 mg/dL (ref 0.3–1.2)
Total Protein: 6.8 g/dL (ref 6.5–8.1)

## 2020-02-01 LAB — URINALYSIS, ROUTINE W REFLEX MICROSCOPIC
Bilirubin Urine: NEGATIVE
Glucose, UA: NEGATIVE mg/dL
Hgb urine dipstick: NEGATIVE
Ketones, ur: NEGATIVE mg/dL
Leukocytes,Ua: NEGATIVE
Nitrite: NEGATIVE
Protein, ur: NEGATIVE mg/dL
Specific Gravity, Urine: 1.004 — ABNORMAL LOW (ref 1.005–1.030)
pH: 6 (ref 5.0–8.0)

## 2020-02-01 LAB — FETAL FIBRONECTIN: Fetal Fibronectin: NEGATIVE

## 2020-02-01 LAB — CBC WITH DIFFERENTIAL/PLATELET
Abs Immature Granulocytes: 0.15 10*3/uL — ABNORMAL HIGH (ref 0.00–0.07)
Basophils Absolute: 0 10*3/uL (ref 0.0–0.1)
Basophils Relative: 0 %
Eosinophils Absolute: 0.1 10*3/uL (ref 0.0–0.5)
Eosinophils Relative: 1 %
HCT: 36.5 % (ref 36.0–46.0)
Hemoglobin: 11.9 g/dL — ABNORMAL LOW (ref 12.0–15.0)
Immature Granulocytes: 1 %
Lymphocytes Relative: 21 %
Lymphs Abs: 2.3 10*3/uL (ref 0.7–4.0)
MCH: 28.5 pg (ref 26.0–34.0)
MCHC: 32.6 g/dL (ref 30.0–36.0)
MCV: 87.3 fL (ref 80.0–100.0)
Monocytes Absolute: 0.8 10*3/uL (ref 0.1–1.0)
Monocytes Relative: 8 %
Neutro Abs: 7.4 10*3/uL (ref 1.7–7.7)
Neutrophils Relative %: 69 %
Platelets: 201 10*3/uL (ref 150–400)
RBC: 4.18 MIL/uL (ref 3.87–5.11)
RDW: 13.8 % (ref 11.5–15.5)
WBC: 10.8 10*3/uL — ABNORMAL HIGH (ref 4.0–10.5)
nRBC: 0.2 % (ref 0.0–0.2)

## 2020-02-01 MED ORDER — PROMETHAZINE HCL 25 MG/ML IJ SOLN
12.5000 mg | Freq: Once | INTRAMUSCULAR | Status: AC
  Administered 2020-02-01: 12.5 mg via INTRAMUSCULAR

## 2020-02-01 MED ORDER — BUTALBITAL-APAP-CAFFEINE 50-325-40 MG PO TABS
1.0000 | ORAL_TABLET | Freq: Once | ORAL | Status: AC
  Administered 2020-02-01: 1 via ORAL
  Filled 2020-02-01: qty 1

## 2020-02-01 MED ORDER — ONDANSETRON HCL 4 MG PO TABS: 4.0000 mg | ORAL_TABLET | Freq: Three times a day (TID) | ORAL | 0 refills | Status: DC | PRN

## 2020-02-01 MED ORDER — FENTANYL CITRATE (PF) 100 MCG/2ML IJ SOLN
100.0000 ug | Freq: Once | INTRAMUSCULAR | Status: DC
  Filled 2020-02-01: qty 2

## 2020-02-01 MED ORDER — FENTANYL CITRATE (PF) 100 MCG/2ML IJ SOLN
100.0000 ug | Freq: Once | INTRAMUSCULAR | Status: AC
  Administered 2020-02-01: 100 ug via INTRAMUSCULAR

## 2020-02-01 MED ORDER — PROMETHAZINE HCL 25 MG/ML IJ SOLN
12.5000 mg | Freq: Once | INTRAMUSCULAR | Status: DC
  Filled 2020-02-01: qty 1

## 2020-02-01 MED ORDER — NIFEDIPINE 10 MG PO CAPS
10.0000 mg | ORAL_CAPSULE | ORAL | Status: DC | PRN
  Administered 2020-02-01: 10 mg via ORAL
  Filled 2020-02-01 (×2): qty 1

## 2020-02-01 MED ORDER — LACTATED RINGERS IV SOLN: INTRAVENOUS | Status: DC

## 2020-02-01 MED ORDER — NIFEDIPINE 10 MG PO CAPS: 10.0000 mg | ORAL_CAPSULE | Freq: Four times a day (QID) | ORAL | 0 refills | Status: DC | PRN

## 2020-02-01 NOTE — Discharge Instructions (Signed)
Braxton Hicks Contractions Contractions of the uterus can occur throughout pregnancy, but they are not always a sign that you are in labor. You may have practice contractions called Braxton Hicks contractions. These false labor contractions are sometimes confused with true labor. What are Braxton Hicks contractions? Braxton Hicks contractions are tightening movements that occur in the muscles of the uterus before labor. Unlike true labor contractions, these contractions do not result in opening (dilation) and thinning of the cervix. Toward the end of pregnancy (32-34 weeks), Braxton Hicks contractions can happen more often and may become stronger. These contractions are sometimes difficult to tell apart from true labor because they can be very uncomfortable. You should not feel embarrassed if you go to the hospital with false labor. Sometimes, the only way to tell if you are in true labor is for your health care provider to look for changes in the cervix. The health care provider will do a physical exam and may monitor your contractions. If you are not in true labor, the exam should show that your cervix is not dilating and your water has not broken. If there are no other health problems associated with your pregnancy, it is completely safe for you to be sent home with false labor. You may continue to have Braxton Hicks contractions until you go into true labor. How to tell the difference between true labor and false labor True labor  Contractions last 30-70 seconds.  Contractions become very regular.  Discomfort is usually felt in the top of the uterus, and it spreads to the lower abdomen and low back.  Contractions do not go away with walking.  Contractions usually become more intense and increase in frequency.  The cervix dilates and gets thinner. False labor  Contractions are usually shorter and not as strong as true labor contractions.  Contractions are usually irregular.  Contractions  are often felt in the front of the lower abdomen and in the groin.  Contractions may go away when you walk around or change positions while lying down.  Contractions get weaker and are shorter-lasting as time goes on.  The cervix usually does not dilate or become thin. Follow these instructions at home:   Take over-the-counter and prescription medicines only as told by your health care provider.  Keep up with your usual exercises and follow other instructions from your health care provider.  Eat and drink lightly if you think you are going into labor.  If Braxton Hicks contractions are making you uncomfortable: ? Change your position from lying down or resting to walking, or change from walking to resting. ? Sit and rest in a tub of warm water. ? Drink enough fluid to keep your urine pale yellow. Dehydration may cause these contractions. ? Do slow and deep breathing several times an hour.  Keep all follow-up prenatal visits as told by your health care provider. This is important. Contact a health care provider if:  You have a fever.  You have continuous pain in your abdomen. Get help right away if:  Your contractions become stronger, more regular, and closer together.  You have fluid leaking or gushing from your vagina.  You pass blood-tinged mucus (bloody show).  You have bleeding from your vagina.  You have low back pain that you never had before.  You feel your baby's head pushing down and causing pelvic pressure.  Your baby is not moving inside you as much as it used to. Summary  Contractions that occur before labor are   are called Braxton Hicks contractions, false labor, or practice contractions.  Braxton Hicks contractions are usually shorter, weaker, farther apart, and less regular than true labor contractions. True labor contractions usually become progressively stronger and regular, and they become more frequent.  Manage discomfort from  [facility] contractions by changing position, resting in a warm bath, drinking plenty of water, or practicing deep breathing. This information is not intended to replace advice given to you by your health care provider. Make sure you discuss any questions you have with your health care provider. Document Revised: 09/11/2017 Document Reviewed: 02/12/2017 Elsevier Patient Education  2020 [employer].   Signs and Symptoms of Labor Labor is your body's natural process of moving your baby, placenta, and umbilical cord out of your uterus. The process of labor usually starts when your baby is full-term, between 66 and 40 weeks of pregnancy. How will I know when I am close to going into labor? As your body prepares for labor and the birth of your baby, you may notice the following symptoms in the weeks and days before true labor starts:  Having a strong desire to get your home ready to receive your new baby. This is called nesting. Nesting may be a sign that labor is approaching, and it may occur several weeks before birth. Nesting may involve cleaning and organizing your home.  Passing a small amount of thick, bloody mucus out of your vagina (normal bloody show or losing your mucus plug). This may happen more than a week before labor begins, or it might occur right before labor begins as the opening of the cervix starts to widen (dilate). For some women, the entire mucus plug passes at once. For others, smaller portions of the mucus plug may gradually pass over several days.  Your baby moving (dropping) lower in your pelvis to get into position for birth (lightening). When this happens, you may feel more pressure on your bladder and pelvic bone and less pressure on your ribs. This may make it easier to breathe. It may also cause you to need to urinate more often and have problems with bowel movements.  Having "practice contractions" (Braxton Hicks contractions) that occur at irregular (unevenly  spaced) intervals that are more than 10 minutes apart. This is also called false labor. False labor contractions are common after exercise or sexual activity, and they will stop if you change position, rest, or drink fluids. These contractions are usually mild and do not get stronger over time. They may feel like: ? A backache or back pain. ? Mild cramps, similar to menstrual cramps. ? Tightening or pressure in your abdomen. Other early symptoms that labor may be starting soon include:  Nausea or loss of appetite.  Diarrhea.  Having a sudden burst of energy, or feeling very tired.  Mood changes.  Having trouble sleeping. How will I know when labor has begun? Signs that true labor has begun may include:  Having contractions that come at regular (evenly spaced) intervals and increase in intensity. This may feel like more intense tightening or pressure in your abdomen that moves to your back. ? Contractions may also feel like rhythmic pain in your upper thighs or back that comes and goes at regular intervals. ? For first-time mothers, this change in intensity of contractions often occurs at a more gradual pace. ? Women who have given birth before may notice a more rapid progression of contraction changes.  Having a feeling of pressure in the vaginal area.  Your water breaking (rupture of membranes). This is when the sac of fluid that surrounds your baby breaks. When this happens, you will notice fluid leaking from your vagina. This may be clear or blood-tinged. Labor usually starts within 24 hours of your water breaking, but it may take longer to begin. ? Some women notice this as a gush of fluid. ? Others notice that their underwear repeatedly becomes damp. Follow these instructions at home:   When labor starts, or if your water breaks, call your health care provider or nurse care line. Based on your situation, they will determine when you should go in for an exam.  When you are in  early labor, you may be able to rest and manage symptoms at home. Some strategies to try at home include: ? Breathing and relaxation techniques. ? Taking a warm bath or shower. ? Listening to music. ? Using a heating pad on the lower back for pain. If you are directed to use heat:  Place a towel between your skin and the heat source.  Leave the heat on for 20-30 minutes.  Remove the heat if your skin turns bright red. This is especially important if you are unable to feel pain, heat, or cold. You may have a greater risk of getting burned. Get help right away if:  You have painful, regular contractions that are 5 minutes apart or less.  Labor starts before you are [redacted] weeks along in your pregnancy.  You have a fever.  You have a headache that does not go away.  You have bright red blood coming from your vagina.  You do not feel your baby moving.  You have a sudden onset of: ? Severe headache with vision problems. ? Nausea, vomiting, or diarrhea. ? Chest pain or shortness of breath. These symptoms may be an emergency. If your health care provider recommends that you go to the hospital or birth center where you plan to deliver, do not drive yourself. Have someone else drive you, or call emergency services (911 in the [place] Summary  Labor is your body's natural process of moving your baby, placenta, and umbilical cord out of your uterus.  The process of labor usually starts when your baby is full-term, between 75 and 40 weeks of pregnancy.  When labor starts, or if your water breaks, call your health care provider or nurse care line. Based on your situation, they will determine when you should go in for an exam. This information is not intended to replace advice given to you by your health care provider. Make sure you discuss any questions you have with your health care provider. Document Revised: 06/29/2017 Document Reviewed: 03/06/2017 Elsevier Patient Education  [employer].

## 2020-02-01 NOTE — MAU Provider Note (Addendum)
History     CSN: [id number]  Arrival date and time: 02/01/20 6283   First Provider Initiated Contact with Patient 02/01/20 1956      Chief Complaint  Patient presents with  . Abdominal Pain   HPI   Katelyn Mcfarland is a 29 y.o. female [phone] @ 65w5dhere in MAU with complaints of right lower quadrant pain that radiates around to her right lower back. The pain started this morning around 0500, the pain worsened this afternoon, the pain comes and goes. She had a video visit today and was told the pain was likely growing pain. No fever. + n/v over the last few days. The pain in her abdomen is worse than her back pain. Recent constipation, had a large BM this morning. Has not taken anything for the pain.   OB History    Gravida  7   Para  3   Term  3   Preterm  0   AB  3   Living  3     SAB  2   TAB  1   Ectopic  0   Multiple  0   Live Births  3           Past Medical History:  Diagnosis Date  . Allergy   . Chlamydia    age 29yo hx/o trichomonas age 29yo . Chronic headache   . Depression    doing ok, declined meds  . High cholesterol   . Infection    UTI  . Vaginal Pap smear, abnormal    bx, cryo    Past Surgical History:  Procedure Laterality Date  . INDUCED ABORTION    . TUBAL LIGATION Bilateral 11/15/2018   Procedure: POST PARTUM TUBAL LIGATION;  Surgeon: EChancy Milroy MD;  Location: [facility]  Service: Gynecology;  Laterality: Bilateral;  . WISDOM TOOTH EXTRACTION      Family History  Problem Relation Age of Onset  . Hypertension Mother   . Diabetes Mother   . Asthma Sister   . Cancer Paternal Grandmother   . Cancer Paternal Grandfather   . Cancer Maternal Grandmother   . Kidney disease Maternal Grandmother   . Diabetes Maternal Grandmother   . Heart disease Neg Hx   . Stroke Neg Hx     Social History   Tobacco Use  . Smoking status: Former Smoker    Packs/day: 1.00    Years: 16.00    Pack years: 16.00     Types: Cigarettes    Start date: 10/13/2000  . Smokeless tobacco: Never Used  . Tobacco comment: stopped August 2020  Substance Use Topics  . Alcohol use: Not Currently    Comment: not currently, socially  . Drug use: Not Currently    Types: Marijuana    Comment: last was october 2020    Allergies: No Known Allergies  Medications Prior to Admission  Medication Sig Dispense Refill Last Dose  . acetaminophen (TYLENOL) 500 MG tablet Take 1,000 mg by mouth every 6 (six) hours as needed for mild pain.   Past Month at Unknown time  . aspirin EC 81 MG tablet Take 1 tablet (81 mg total) by mouth daily. 30 tablet 5 01/31/2020 at Unknown time  . ondansetron (ZOFRAN ODT) 4 MG disintegrating tablet Take 1 tablet (4 mg total) by mouth every 6 (six) hours as needed for nausea. 20 tablet 3 02/01/2020 at 0800  . Prenatal Vit-Fe Phos-FA-Omega (VITAFOL GUMMIES) 3.33-0.333-34.8 MG CHEW Chew  1 tablet by mouth daily. 90 tablet 3 01/31/2020 at Unknown time  . Blood Pressure Monitoring (BLOOD PRESSURE KIT) DEVI 1 kit by Does not apply route once a week. Check BP regularly and record readings into the [employer] App. Large Cuff. DX: O90.0 1 each 0   . cyclobenzaprine (FLEXERIL) 10 MG tablet Take 1 tablet (10 mg total) by mouth 3 (three) times daily as needed. 30 tablet 0 More than a month at Unknown time  . Elastic Bandages & Supports (COMFORT FIT MATERNITY SUPP SM) MISC Wear as directed. (Patient not taking: Reported on 01/18/2020) 1 each 0   . glycopyrrolate (ROBINUL) 2 MG tablet Take 1 tablet (2 mg total) by mouth 3 (three) times daily as needed. (Patient not taking: Reported on 01/18/2020) 30 tablet 3   . ondansetron (ZOFRAN) 4 MG tablet Take 1 tablet (4 mg total) by mouth every 8 (eight) hours as needed for nausea or vomiting. (Patient not taking: Reported on 12/14/2019) 20 tablet 0   . promethazine (PHENERGAN) 25 MG tablet Take 0.5-1 tablets (12.5-25 mg total) by mouth every 6 (six) hours as needed for nausea. (Patient  not taking: Reported on 11/07/2019) 30 tablet 2   . terconazole (TERAZOL 3) 0.8 % vaginal cream Place 1 applicator vaginally at bedtime. Apply nightly for three nights. (Patient not taking: Reported on 01/18/2020) 20 g 0    Results for orders placed or performed during the hospital encounter of 02/01/20 (from the past 48 hour(s))  Urinalysis, Routine w reflex microscopic     Status: Abnormal   Collection Time: 02/01/20  7:37 PM  Result Value Ref Range   Color, Urine YELLOW YELLOW   APPearance CLEAR CLEAR   Specific Gravity, Urine 1.004 (L) 1.005 - 1.030   pH 6.0 5.0 - 8.0   Glucose, UA NEGATIVE NEGATIVE mg/dL   Hgb urine dipstick NEGATIVE NEGATIVE   Bilirubin Urine NEGATIVE NEGATIVE   Ketones, ur NEGATIVE NEGATIVE mg/dL   Protein, ur NEGATIVE NEGATIVE mg/dL   Nitrite NEGATIVE NEGATIVE   Leukocytes,Ua NEGATIVE NEGATIVE    Comment: Performed at [facility] [address], [place], [place] [address]   Review of Systems  Constitutional: Negative for fever.  Gastrointestinal: Positive for abdominal pain, nausea and vomiting.  Genitourinary: Negative for vaginal bleeding and vaginal discharge.   Physical Exam   Blood pressure 115/64, pulse 88, temperature 98.3 F (36.8 C), temperature source Oral, resp. rate 16, height '4\' 11"'  (1.499 m), last menstrual period 07/08/2019, SpO2 100 %, not currently breastfeeding.  Physical Exam  Constitutional: She is oriented to person, place, and time. She appears well-developed and well-nourished. She appears distressed.  GI: Soft. There is abdominal tenderness in the right lower quadrant and suprapubic area. There is rebound. There is no rigidity, no guarding and no CVA tenderness.  Patient having difficulty moving positions in bed d/t pain.   Genitourinary:    Genitourinary Comments: Dilation: Closed Effacement (%): Thick Exam by:: Noni Saupe, NP FFN collected and sent    Neurological: She is alert and oriented to person, place, and time.   Skin: Skin is warm.  Psychiatric: Her behavior is normal.   Fetal Tracing: Baseline: 145 bpm Variability: Moderate  Accelerations: 15x15 Decelerations: None Toco: None  MAU Course  Procedures  None  MDM  CBC with diff. FFN collected  CMP Urine culture LR @ 125 ml/hour.  Fentanyl & phenergan given for pain.  Urine drug screen sent. Report given to Hansel Feinstein CNM who resumes care  of the patient.   Rasch, Artist Pais, NP  Reviewed partial lab results No significant leukocytosis Negative Fetal fibronectin Will give the Procardia series to see if stopping contractions helps the pain >> contractions slowed but then BP went to 99 so we stopped Procardia   Assessment and Plan    11:23 PM Assumed care of patient.  ECG unremarkable, sinus rhythm with no ischemic changes. On my re-evaluation patient now denies any chest pain.  Endorses coming and going abdominal pain again. Cervix re-examined, still closed and long, only rare uterine activity in past hour. FHT baseline 145, moderate variability, +accels, no decels, Cat I w Reactive NST   DDx is most likely painful Braxton-Hicks. Preterm labor ruled out with no change in cervix and negative FFN is reassuring. Appendicitis unlikely given lack of fever, unremarkable WBC of 10.8 in pregnancy. Constipation may be playing a role, as could pain of advancing gestational age.  Discussed most likely cause, encouraged hydration and reviewed warning signs such as fever, crescendo abdominal pain, consistent contractions that increase in frequency and intensity, n/v such that she is unable to take PO, LOF, VB, DFM. Will send procardia for comfort.   D/c to home in stable condition  Clarnce Flock

## 2020-02-01 NOTE — Progress Notes (Signed)
S/w pt for virtual visit, pt reports fetal movement with occasional pressure, denies contractions.

## 2020-02-01 NOTE — MAU Note (Signed)
. .  Katelyn Mcfarland is a 29 y.o. at [redacted]w[redacted]d here in MAU reporting: Patient reporting sharp lower abdominal pain that wraps around to the right side of her back. It began this morning around 0500 and increased in intensity to a 10/10 at 1700 this afternoon. Patient denies LOF and VB and endorses + FM. Patient also reports voiding on herself today. She denies HA or blurry vision.  Pain score: 10 Vitals:   02/01/20 1933  BP: 138/63  Pulse: 89  Resp: 16  Temp: 98.3 F (36.8 C)  SpO2: 100%     FHT:145 Lab orders placed from triage:

## 2020-02-01 NOTE — Progress Notes (Signed)
OBSTETRICS PRENATAL VIRTUAL VISIT ENCOUNTER NOTE  Provider location: Center for [facility] at [place]   I connected with Katelyn Mcfarland on 02/01/20 at 10:45 AM EDT by MyChart Video Encounter at home and verified that I am speaking with the correct person using two identifiers.   I discussed the limitations, risks, security and privacy concerns of performing an evaluation and management service virtually and the availability of in person appointments. I also discussed with the patient that there may be a patient responsible charge related to this service. The patient expressed understanding and agreed to proceed. Subjective:  Katelyn Mcfarland is a 29 y.o. [phone] at [redacted]w[redacted]d being seen today for ongoing prenatal care.  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  Patient reports backache.  Contractions: Not present. Vag. Bleeding: None.  Movement: Present. Denies any leaking of fluid.   The following portions of the patient's history were reviewed and updated as appropriate: allergies, current medications, past family history, past medical history, past social history, past surgical history and problem list.   Objective:   Vitals:   02/01/20 1031  BP: 111/65  Pulse: 100    Fetal Status:     Movement: Present     General:  Alert, oriented and cooperative. Patient is in no acute distress.  Respiratory: Normal respiratory effort, no problems with respiration noted  Mental Status: Normal mood and affect. Normal behavior. Normal  judgment and thought content.  Rest of physical exam deferred due to type of encounter  Imaging: [name] MFM OB FOLLOW UP  Result Date: 01/20/2020 ----------------------------------------------------------------------  OBSTETRICS REPORT                       (Signed Final 01/20/2020 09:50 am) ---------------------------------------------------------------------- Patient Info  ID #:       [id number]                          D.O.B.:  Feb 12, 1991 (28 yrs)  Name:       Katelyn Mcfarland [facility]                 Visit Date: 01/20/2020 08:31 am ---------------------------------------------------------------------- Performed By  Performed By:     Eden Lathe BS      Referred By:      Marylen Ponto                    RDMS RVT  Attending:        Ma Rings MD         [facility] ---------------------------------------------------------------------- Orders   #  Description  Code         Ordered By   1  [name] MFM OB FOLLOW UP                  [id number]     Rosana Hoes  ----------------------------------------------------------------------   #  Order #                    Accession #                 Episode #   1  [id number]                  [id number]                  [id number]  ---------------------------------------------------------------------- Indications   Obesity complicating pregnancy, third          O99.213   trimester   Encounter for other antenatal screening        Z36.2   follow-up   Low risk NIPS, 6.1FF, Neg AFP   Poor obstetric history: Previous               O09.299   preeclampsia / eclampsia/gestational HTN   History of sickle cell trait                   Z86.2   [redacted] weeks gestation of pregnancy                Z3A.28  ---------------------------------------------------------------------- Vital Signs                                                 Height:        4'11"  ---------------------------------------------------------------------- Fetal Evaluation  Num Of Fetuses:         1  Fetal Heart Rate(bpm):  144  Cardiac Activity:       Observed  Presentation:           Cephalic  Placenta:               Anterior  P. Cord Insertion:      Visualized  Amniotic Fluid  AFI FV:      Within normal limits  AFI Sum(cm)     %Tile       Largest Pocket(cm)  20.18           81          6.67  RUQ(cm)       RLQ(cm)       LUQ(cm)        LLQ(cm)  3.91          3             6.6            6.67 ---------------------------------------------------------------------- Biometry  BPD:      72.6  mm     G. Age:  29w 1d         75  %    CI:        72.45   %    70 - 86  FL/HC:      19.1   %    18.8 - 20.6  HC:      271.3  mm     G. Age:  29w 4d         70  %    HC/AC:      1.13        1.05 - 1.21  AC:      239.3  mm     G. Age:  28w 2d         49  %    FL/BPD:     71.2   %    71 - 87  FL:       51.7  mm     G. Age:  27w 4d         24  %    FL/AC:      21.6   %    20 - 24  HUM:      48.1  mm     G. Age:  28w 1d         51  %  LV:        7.8  mm  Est. FW:    1191  gm    2 lb 10 oz      45  % ---------------------------------------------------------------------- OB History  Gravidity:    7         Term:   3         SAB:   2  TOP:          1        Living:  3 ---------------------------------------------------------------------- Gestational Age  LMP:           28w 0d        Date:  07/08/19                 EDD:   04/13/20  U/S Today:     28w 5d                                        EDD:   04/08/20  Best:          28w 0d     Det. By:  LMP  (07/08/19)          EDD:   04/13/20 ---------------------------------------------------------------------- Anatomy  Cranium:               Appears normal         LVOT:                   Previously seen  Cavum:                 Appears normal         Aortic Arch:            Previously seen  Ventricles:            Appears  normal         Ductal Arch:            Previously seen  Choroid Plexus:        Previously seen        Diaphragm:              Appears normal  Cerebellum:            Previously  seen        Stomach:                Appears normal, left                                                                        sided  Posterior Fossa:       Previously seen        Abdomen:                Appears normal  Nuchal Fold:           Previously seen        Abdominal Wall:         Previously seen  Face:                  Appears normal         Cord Vessels:           Appears normal (3                         (orbits and profile)                           vessel cord)  Lips:                  Appears normal         Kidneys:                Appear normal  Palate:                Not well visualized    Bladder:                Appears normal  Thoracic:              Appears normal         Spine:                  Previously seen  Heart:                 Appears normal         Upper Extremities:      Previously seen                         (4CH, axis, and                         situs)  RVOT:                  Previously seen        Lower Extremities:      Previously seen  Other:  Rt heel previously visualized. Nasal bone visualized. Technically          difficult due to maternal habitus and fetal position. ---------------------------------------------------------------------- Cervix Uterus Adnexa  Cervix  Not visualized (advanced GA >24wks)  Uterus  No abnormality visualized.  Left Ovary  Not visualized.  Right Ovary  Not visualized.  Cul De Sac  No free fluid seen.  Adnexa  No abnormality visualized. ----------------------------------------------------------------------  Comments  This patient was seen for a follow up growth scan due to due  to maternal obesity.  She denies any problems since her last  exam.  She was informed that the fetal growth and amniotic fluid  level appears appropriate for her gestational age.  A follow up exam was  scheduled in 5 weeks. ----------------------------------------------------------------------                   Johnell Comings, MD Electronically Signed Final Report   01/20/2020 09:50 am ----------------------------------------------------------------------   Assessment and Plan:  Pregnancy: [id number] at [redacted]w[redacted]d 1. Supervision of other normal pregnancy, antepartum  2. Obesity affecting pregnancy in second trimester  3. History of gestational hypertension   Preterm labor symptoms and general obstetric precautions including but not limited to vaginal bleeding, contractions, leaking of fluid and fetal movement were reviewed in detail with the patient. I discussed the assessment and treatment plan with the patient. The patient was provided an opportunity to ask questions and all were answered. The patient agreed with the plan and demonstrated an understanding of the instructions. The patient was advised to call back or seek an in-person office evaluation/go to MAU at [facility] for any urgent or concerning symptoms. Please refer to After Visit Summary for other counseling recommendations.   I provided 10 minutes of face-to-face time during this encounter.  No follow-ups on file.  Future Appointments  Date Time Provider [place]  02/24/2020  9:30 AM [facility] [place]  02/24/2020  9:30 AM [facility] [name] 3 [facility] MFC-US    Sieanna Vanstone, [place] for [facility] 02/01/2020

## 2020-02-02 LAB — RAPID URINE DRUG SCREEN, HOSP PERFORMED
Amphetamines: NOT DETECTED
Barbiturates: NOT DETECTED
Benzodiazepines: NOT DETECTED
Cocaine: NOT DETECTED
Opiates: NOT DETECTED
Tetrahydrocannabinol: NOT DETECTED

## 2020-02-04 LAB — CULTURE, OB URINE
Culture: 5000 — AB
Special Requests: NORMAL

## 2020-02-05 ENCOUNTER — Encounter: Payer: Self-pay | Admitting: Student

## 2020-02-15 ENCOUNTER — Telehealth (INDEPENDENT_AMBULATORY_CARE_PROVIDER_SITE_OTHER): Payer: Medicaid Other | Admitting: Obstetrics

## 2020-02-15 ENCOUNTER — Encounter: Payer: Self-pay | Admitting: Obstetrics

## 2020-02-15 ENCOUNTER — Ambulatory Visit: Payer: Medicaid Other | Attending: Internal Medicine

## 2020-02-15 VITALS — BP 120/78 | HR 109

## 2020-02-15 DIAGNOSIS — Z20822 Contact with and (suspected) exposure to covid-19: Secondary | ICD-10-CM

## 2020-02-15 DIAGNOSIS — Z3490 Encounter for supervision of normal pregnancy, unspecified, unspecified trimester: Secondary | ICD-10-CM

## 2020-02-15 DIAGNOSIS — O99210 Obesity complicating pregnancy, unspecified trimester: Secondary | ICD-10-CM

## 2020-02-15 MED ORDER — DOXYCYCLINE HYCLATE 100 MG PO TABS: 100.0000 mg | ORAL_TABLET | Freq: Two times a day (BID) | ORAL | 2 refills | Status: DC

## 2020-02-15 NOTE — Progress Notes (Signed)
OBSTETRICS PRENATAL VIRTUAL VISIT ENCOUNTER NOTE  Provider location: Center for [facility] [place]   I connected with Katelyn Mcfarland on 02/15/20 at  9:00 AM EDT by MyChart Video Encounter at home and verified that I am speaking with the correct person using two identifiers.   I discussed the limitations, risks, security and privacy concerns of performing an evaluation and management service virtually and the availability of in person appointments. I also discussed with the patient that there may be a patient responsible charge related to this service. The patient expressed understanding and agreed to proceed. Subjective:  Katelyn Mcfarland is a 29 y.o. [phone] at [redacted]w[redacted]d being seen today for ongoing prenatal care.  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  Patient reports no complaints.   .  .   . Denies any leaking of fluid.   The following portions of the patient's history were reviewed and updated as appropriate: allergies, current medications, past family history, past medical history, past social history, past surgical history and problem list.   Objective:  There were no vitals filed for this visit.  Fetal Status:           General:  Alert, oriented and cooperative. Patient is in no acute distress.  Respiratory: Normal respiratory effort, no problems with respiration noted  Mental Status: Normal mood and affect. Normal behavior. Normal judgment and thought content.  Rest of physical exam deferred due  to type of encounter  Imaging: [name] MFM OB FOLLOW UP  Result Date: 01/20/2020 ----------------------------------------------------------------------  OBSTETRICS REPORT                       (Signed Final 01/20/2020 09:50 am) ---------------------------------------------------------------------- Patient Info  ID #:       [id number]                          D.O.B.:  12/19/1990 (28 yrs)  Name:       Katelyn Mcfarland [facility]                 Visit Date: 01/20/2020 08:31 am ---------------------------------------------------------------------- Performed By  Performed By:     Eden Lathe BS      Referred By:      Marylen Ponto                    RDMS RVT  Attending:        Ma Rings MD         [facility] ---------------------------------------------------------------------- Orders   #  Description  Code         Ordered By   1  US MFM OB FOLLOW UP                  [id number]     Rosana HoesYU FANG  ----------------------------------------------------------------------   #  Order #                    Accession #                 Episode #   1  [id number]                  [id number]                  [id number]  ---------------------------------------------------------------------- Indications   Obesity complicating pregnancy, third          O99.213   trimester   Encounter for other antenatal screening        Z36.2   follow-up   Low risk NIPS, 6.1FF, Neg AFP   Poor obstetric history: Previous               O09.299   preeclampsia / eclampsia/gestational HTN   History of sickle cell trait                   Z86.2   [redacted] weeks gestation of pregnancy                Z3A.28  ---------------------------------------------------------------------- Vital Signs                                                 Height:        4'11" ---------------------------------------------------------------------- Fetal Evaluation  Num Of Fetuses:          1  Fetal Heart Rate(bpm):  144  Cardiac Activity:       Observed  Presentation:           Cephalic  Placenta:               Anterior  P. Cord Insertion:      Visualized  Amniotic Fluid  AFI FV:      Within normal limits  AFI Sum(cm)     %Tile       Largest Pocket(cm)  20.18           81          6.67  RUQ(cm)       RLQ(cm)       LUQ(cm)        LLQ(cm)  3.91          3             6.6            6.67 ---------------------------------------------------------------------- Biometry  BPD:      72.6  mm     G. Age:  29w 1d         75  %    CI:        72.45   %    70 - 86  FL/HC:      19.1   %    18.8 - 20.6  HC:      271.3  mm     G. Age:  29w 4d         70  %    HC/AC:      1.13        1.05 - 1.21  AC:      239.3  mm     G. Age:  28w 2d         49  %    FL/BPD:     71.2   %    71 - 87  FL:       51.7  mm     G. Age:  27w 4d         24  %    FL/AC:      21.6   %    20 - 24  HUM:      48.1  mm     G. Age:  28w 1d         51  %  LV:        7.8  mm  Est. FW:    1191  gm    2 lb 10 oz      45  % ---------------------------------------------------------------------- OB History  Gravidity:    7         Term:   3         SAB:   2  TOP:          1        Living:  3 ---------------------------------------------------------------------- Gestational Age  LMP:           28w 0d        Date:  07/08/19                 EDD:   04/13/20  U/S Today:     28w 5d                                        EDD:   04/08/20  Best:          28w 0d     Det. By:  LMP  (07/08/19)          EDD:   04/13/20 ---------------------------------------------------------------------- Anatomy  Cranium:               Appears normal         LVOT:                   Previously seen  Cavum:                 Appears normal         Aortic Arch:            Previously seen  Ventricles:            Appears normal         Ductal Arch:            Previously seen  Choroid Plexus:        Previously seen        Diaphragm:               Appears normal  Cerebellum:            Previously  seen        Stomach:                Appears normal, left                                                                        sided  Posterior Fossa:       Previously seen        Abdomen:                Appears normal  Nuchal Fold:           Previously seen        Abdominal Wall:         Previously seen  Face:                  Appears normal         Cord Vessels:           Appears normal (3                         (orbits and profile)                           vessel cord)  Lips:                  Appears normal         Kidneys:                Appear normal  Palate:                Not well visualized    Bladder:                Appears normal  Thoracic:              Appears normal         Spine:                  Previously seen  Heart:                 Appears normal         Upper Extremities:      Previously seen                         (4CH, axis, and                         situs)  RVOT:                  Previously seen        Lower Extremities:      Previously seen  Other:  Rt heel previously visualized. Nasal bone visualized. Technically          difficult due to maternal habitus and fetal position. ---------------------------------------------------------------------- Cervix Uterus Adnexa  Cervix  Not visualized (advanced GA >24wks)  Uterus  No abnormality visualized.  Left Ovary  Not visualized.  Right Ovary  Not visualized.  Cul De Sac  No free fluid seen.  Adnexa  No abnormality visualized. ----------------------------------------------------------------------  Comments  This patient was seen for a follow up growth scan due to due  to maternal obesity.  She denies any problems since her last  exam.  She was informed that the fetal growth and amniotic fluid  level appears appropriate for her gestational age.  A follow up exam was scheduled in 5 weeks. ----------------------------------------------------------------------                   Ma Rings,  MD Electronically Signed Final Report   01/20/2020 09:50 am ----------------------------------------------------------------------   Assessment and Plan:  Pregnancy: [id number] at [redacted]w[redacted]d 1. Encounter for supervision of low-risk pregnancy, antepartum  2. Obesity affecting pregnancy, antepartum   Preterm labor symptoms and general obstetric precautions including but not limited to vaginal bleeding, contractions, leaking of fluid and fetal movement were reviewed in detail with the patient. I discussed the assessment and treatment plan with the patient. The patient was provided an opportunity to ask questions and all were answered. The patient agreed with the plan and demonstrated an understanding of the instructions. The patient was advised to call back or seek an in-person office evaluation/go to [facility]U at [facility] for any urgent or concerning symptoms. Please refer to After Visit Summary for other counseling recommendations.   I provided 10 minutes of face-to-face time during this encounter.  No follow-ups on file.  Future Appointments  Date Time Provider Department Center  02/15/2020 11:00 AM [place]: [address], [place] PEC-PEC PEC  02/24/2020  9:30 AM [facility] NURSE [facility]  02/24/2020  9:30 AM [facility] US3 [facility]    Coral Ceo, MD Center for [facility] 02/15/2020

## 2020-02-16 LAB — NOVEL CORONAVIRUS, NAA: SARS-CoV-2, NAA: NOT DETECTED

## 2020-02-16 LAB — SARS-COV-2, NAA 2 DAY TAT

## 2020-02-24 ENCOUNTER — Other Ambulatory Visit: Payer: Self-pay | Admitting: *Deleted

## 2020-02-24 ENCOUNTER — Other Ambulatory Visit: Payer: Self-pay

## 2020-02-24 ENCOUNTER — Ambulatory Visit (HOSPITAL_COMMUNITY): Payer: Medicaid Other | Attending: Obstetrics and Gynecology

## 2020-02-24 ENCOUNTER — Ambulatory Visit: Payer: Medicaid Other | Admitting: *Deleted

## 2020-02-24 VITALS — BP 132/89 | HR 116 | Temp 97.2°F

## 2020-02-24 DIAGNOSIS — Z362 Encounter for other antenatal screening follow-up: Secondary | ICD-10-CM

## 2020-02-24 DIAGNOSIS — O09293 Supervision of pregnancy with other poor reproductive or obstetric history, third trimester: Secondary | ICD-10-CM | POA: Diagnosis not present

## 2020-02-24 DIAGNOSIS — O99213 Obesity complicating pregnancy, third trimester: Secondary | ICD-10-CM | POA: Diagnosis not present

## 2020-02-24 DIAGNOSIS — Z6841 Body Mass Index (BMI) 40.0 and over, adult: Secondary | ICD-10-CM

## 2020-02-24 DIAGNOSIS — D573 Sickle-cell trait: Secondary | ICD-10-CM | POA: Insufficient documentation

## 2020-02-24 DIAGNOSIS — Z3A33 33 weeks gestation of pregnancy: Secondary | ICD-10-CM

## 2020-02-24 DIAGNOSIS — E669 Obesity, unspecified: Secondary | ICD-10-CM

## 2020-02-24 DIAGNOSIS — O99019 Anemia complicating pregnancy, unspecified trimester: Secondary | ICD-10-CM | POA: Insufficient documentation

## 2020-02-24 DIAGNOSIS — Z862 Personal history of diseases of the blood and blood-forming organs and certain disorders involving the immune mechanism: Secondary | ICD-10-CM

## 2020-02-27 ENCOUNTER — Ambulatory Visit (INDEPENDENT_AMBULATORY_CARE_PROVIDER_SITE_OTHER): Payer: Medicaid Other | Admitting: Obstetrics & Gynecology

## 2020-02-27 ENCOUNTER — Encounter: Payer: Self-pay | Admitting: Obstetrics & Gynecology

## 2020-02-27 ENCOUNTER — Other Ambulatory Visit: Payer: Self-pay

## 2020-02-27 VITALS — BP 119/78 | HR 98

## 2020-02-27 DIAGNOSIS — Z3480 Encounter for supervision of other normal pregnancy, unspecified trimester: Secondary | ICD-10-CM

## 2020-02-27 DIAGNOSIS — F329 Major depressive disorder, single episode, unspecified: Secondary | ICD-10-CM

## 2020-02-27 NOTE — Progress Notes (Signed)
ROB   pt states visit today is to discuss depression.

## 2020-02-27 NOTE — Progress Notes (Signed)
   PRENATAL VISIT NOTE  Subjective:  Katelyn Mcfarland is a 29 y.o. [phone] at [redacted]w[redacted]d being seen today for ongoing prenatal care.  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  Patient reports she needs assistance with paperwork for housing.   .  .   . Denies leaking of fluid.   The following portions of the patient's history were reviewed and updated as appropriate: allergies, current medications, past family history, past medical history, past social history, past surgical history and problem list.   Objective:   Vitals:   02/27/20 1610  BP: 119/78  Pulse: 98    Fetal Status:           General:  Alert, oriented and cooperative. Patient is in no acute distress.  Skin: Skin is warm and dry. No rash noted.   Cardiovascular: Normal heart rate noted  Respiratory: Normal respiratory effort, no problems with respiration noted  Abdomen: Soft, gravid, appropriate for gestational age.        Pelvic: Cervical exam deferred        Extremities: Normal range of motion.     Mental Status: Normal mood and affect. Normal behavior. Normal judgment and thought content.   Assessment and Plan:  Pregnancy: [id number] at [redacted]w[redacted]d There are no diagnoses linked to this encounter. Preterm labor symptoms and general obstetric precautions including but not limited to vaginal bleeding, contractions, leaking of fluid and fetal movement were reviewed in detail with the patient. Please refer to After Visit Summary for other counseling  recommendations.   Return in about 2 weeks (around 03/12/2020).  Future Appointments  Date Time Provider Department Center  03/13/2020  4:00 PM Malachy Chamber, MD [facility] None  03/27/2020  9:45 AM [facility] NURSE [facility]  03/27/2020  9:45 AM [facility] US5 [facility]  Supervision of other normal pregnancy, antepartum  Major depressive disorder, remission status unspecified, unspecified whether recurrent    Scheryl Darter, MD

## 2020-02-27 NOTE — Patient Instructions (Signed)

## 2020-02-28 ENCOUNTER — Emergency Department (HOSPITAL_COMMUNITY): Payer: No Typology Code available for payment source

## 2020-02-28 ENCOUNTER — Other Ambulatory Visit: Payer: Self-pay

## 2020-02-28 ENCOUNTER — Inpatient Hospital Stay (HOSPITAL_COMMUNITY): Payer: No Typology Code available for payment source

## 2020-02-28 ENCOUNTER — Inpatient Hospital Stay (HOSPITAL_COMMUNITY)
Admission: AD | Admit: 2020-02-28 | Discharge: 2020-02-29 | DRG: 833 | Disposition: A | Discharge status: Home / Self Care | Payer: No Typology Code available for payment source | Attending: Obstetrics and Gynecology | Admitting: Obstetrics and Gynecology

## 2020-02-28 ENCOUNTER — Encounter (HOSPITAL_COMMUNITY): Payer: Self-pay

## 2020-02-28 DIAGNOSIS — M79605 Pain in left leg: Secondary | ICD-10-CM | POA: Diagnosis present

## 2020-02-28 DIAGNOSIS — Z87891 Personal history of nicotine dependence: Secondary | ICD-10-CM | POA: Diagnosis not present

## 2020-02-28 DIAGNOSIS — M79652 Pain in left thigh: Secondary | ICD-10-CM

## 2020-02-28 DIAGNOSIS — O26893 Other specified pregnancy related conditions, third trimester: Principal | ICD-10-CM | POA: Diagnosis present

## 2020-02-28 DIAGNOSIS — Z20822 Contact with and (suspected) exposure to covid-19: Secondary | ICD-10-CM | POA: Diagnosis present

## 2020-02-28 DIAGNOSIS — M549 Dorsalgia, unspecified: Secondary | ICD-10-CM | POA: Diagnosis present

## 2020-02-28 DIAGNOSIS — S3991XA Unspecified injury of abdomen, initial encounter: Secondary | ICD-10-CM | POA: Diagnosis present

## 2020-02-28 DIAGNOSIS — M25552 Pain in left hip: Secondary | ICD-10-CM | POA: Diagnosis present

## 2020-02-28 DIAGNOSIS — Z3A33 33 weeks gestation of pregnancy: Secondary | ICD-10-CM

## 2020-02-28 DIAGNOSIS — O360110 Maternal care for anti-D [Rh] antibodies, first trimester, not applicable or unspecified: Secondary | ICD-10-CM

## 2020-02-28 LAB — SARS CORONAVIRUS 2 BY RT PCR (HOSPITAL ORDER, PERFORMED IN ~~LOC~~ HOSPITAL LAB): SARS Coronavirus 2: NEGATIVE

## 2020-02-28 MED ORDER — ACETAMINOPHEN 500 MG PO TABS
1000.0000 mg | ORAL_TABLET | Freq: Once | ORAL | Status: AC
  Administered 2020-02-28: 1000 mg via ORAL
  Filled 2020-02-28: qty 2

## 2020-02-28 MED ORDER — ACETAMINOPHEN 325 MG PO TABS: 650.0000 mg | ORAL_TABLET | ORAL | Status: DC | PRN

## 2020-02-28 MED ORDER — DOCUSATE SODIUM 100 MG PO CAPS
100.0000 mg | ORAL_CAPSULE | Freq: Every day | ORAL | Status: DC
  Administered 2020-02-29: 100 mg via ORAL
  Filled 2020-02-28: qty 1

## 2020-02-28 MED ORDER — PRENATAL MULTIVITAMIN CH
1.0000 | ORAL_TABLET | Freq: Every day | ORAL | Status: DC
  Administered 2020-02-28 – 2020-02-29 (×2): 1 via ORAL
  Filled 2020-02-28 (×2): qty 1

## 2020-02-28 MED ORDER — CALCIUM CARBONATE ANTACID 500 MG PO CHEW: 2.0000 | CHEWABLE_TABLET | ORAL | Status: DC | PRN

## 2020-02-28 MED ORDER — CALCIUM CARBONATE ANTACID 500 MG PO CHEW
1.0000 | CHEWABLE_TABLET | Freq: Once | ORAL | Status: AC
  Administered 2020-02-28: 200 mg via ORAL
  Filled 2020-02-28: qty 1

## 2020-02-28 MED ORDER — PANTOPRAZOLE SODIUM 40 MG PO TBEC
40.0000 mg | DELAYED_RELEASE_TABLET | Freq: Once | ORAL | Status: AC
  Administered 2020-02-28: 40 mg via ORAL
  Filled 2020-02-28: qty 1

## 2020-02-28 MED ORDER — CYCLOBENZAPRINE HCL 5 MG PO TABS
10.0000 mg | ORAL_TABLET | Freq: Once | ORAL | Status: AC
  Administered 2020-02-28: 10 mg via ORAL
  Filled 2020-02-28: qty 2

## 2020-02-28 MED ORDER — FAMOTIDINE 20 MG PO TABS
20.0000 mg | ORAL_TABLET | Freq: Once | ORAL | Status: AC
  Administered 2020-02-28: 20 mg via ORAL
  Filled 2020-02-28: qty 1

## 2020-02-28 NOTE — Progress Notes (Signed)
Pt refusing IV at this time.

## 2020-02-28 NOTE — Progress Notes (Signed)
ER cleared pt.  Will transfer pt to MAU for continued EFM.

## 2020-02-28 NOTE — ED Notes (Signed)
Pt. Ambulating to the restroom.  

## 2020-02-28 NOTE — Progress Notes (Signed)
Pt to MAU for continued EFM.
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
## 2020-02-29 ENCOUNTER — Encounter: Payer: Medicaid Other | Admitting: Women's Health

## 2020-02-29 DIAGNOSIS — S3991XA Unspecified injury of abdomen, initial encounter: Secondary | ICD-10-CM

## 2020-02-29 DIAGNOSIS — Z3A33 33 weeks gestation of pregnancy: Secondary | ICD-10-CM
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
## 2020-02-29 NOTE — Discharge Instructions (Signed)
Abdominal Pain During Pregnancy  Abdominal pain is common during pregnancy, and has many possible causes. Some causes are more serious than others, and sometimes the cause is not known. Abdominal pain can be a sign that labor is starting. It can also be caused by normal growth and stretching of muscles and ligaments during pregnancy. Always tell your health care provider if you have any abdominal pain. Follow these instructions at home:  Do not have sex or put anything in your vagina until your pain goes away completely.  Get plenty of rest until your pain improves.  Drink enough fluid to keep your urine pale yellow.  Take over-the-counter and prescription medicines only as told by your health care provider.  Keep all follow-up visits as told by your health care provider. This is important. Contact a health care provider if:  Your pain continues or gets worse after resting.  You have lower abdominal pain that: ? Comes and goes at regular intervals. ? Spreads to your back. ? Is similar to menstrual cramps.  You have pain or burning when you urinate. Get help right away if:  You have a fever or chills.  You have vaginal bleeding.  You are leaking fluid from your vagina.  You are passing tissue from your vagina.  You have vomiting or diarrhea that lasts for more than 24 hours.  Your baby is moving less than usual.  You feel very weak or faint.  You have shortness of breath.  You develop severe pain in your upper abdomen. Summary  Abdominal pain is common during pregnancy, and has many possible causes.  If you experience abdominal pain during pregnancy, tell your health care provider right away.  Follow your health care provider's home care instructions and keep all follow-up visits as directed. This information is not intended to replace advice given to you by your health care provider. Make sure you discuss any questions you have with your health care  provider. Document Revised: 01/17/2019 Document Reviewed: 01/01/2017 Elsevier Patient Education  2020 [employer] Inc[employer]  

## 2020-03-02 LAB — TYPE AND SCREEN
ABO/RH(D): O POS
Antibody Screen: POSITIVE
Unit division: 0
Unit division: 0

## 2020-03-02 LAB — BPAM RBC
Blood Product Expiration Date: 202106032359
Blood Product Expiration Date: 202106032359
Unit Type and Rh: 9500
Unit Type and Rh: 9500

## 2020-03-06 ENCOUNTER — Telehealth: Payer: Self-pay | Admitting: *Deleted

## 2020-03-06 NOTE — Telephone Encounter (Signed)
Pt called to office LM stating she is 34 weeks and been ctxing since around 10 am. States they are getting stronger- ?should she be seen at hospital.  Attempt to return call. LVM making pt aware of times she needs to be seen- LOF/bleeding/ ctx 3-5 min.  Advsied to call office if she has any other concerns.

## 2020-03-13 ENCOUNTER — Telehealth (INDEPENDENT_AMBULATORY_CARE_PROVIDER_SITE_OTHER): Payer: Medicaid Other | Admitting: Obstetrics & Gynecology

## 2020-03-13 DIAGNOSIS — O98813 Other maternal infectious and parasitic diseases complicating pregnancy, third trimester: Secondary | ICD-10-CM | POA: Diagnosis not present

## 2020-03-13 DIAGNOSIS — O99013 Anemia complicating pregnancy, third trimester: Secondary | ICD-10-CM | POA: Diagnosis not present

## 2020-03-13 DIAGNOSIS — Z3A35 35 weeks gestation of pregnancy: Secondary | ICD-10-CM

## 2020-03-13 DIAGNOSIS — Z3480 Encounter for supervision of other normal pregnancy, unspecified trimester: Secondary | ICD-10-CM

## 2020-03-13 DIAGNOSIS — O99333 Smoking (tobacco) complicating pregnancy, third trimester: Secondary | ICD-10-CM | POA: Diagnosis not present

## 2020-03-13 DIAGNOSIS — D573 Sickle-cell trait: Secondary | ICD-10-CM

## 2020-03-13 DIAGNOSIS — O99343 Other mental disorders complicating pregnancy, third trimester: Secondary | ICD-10-CM | POA: Diagnosis not present

## 2020-03-13 MED ORDER — CYCLOBENZAPRINE HCL 5 MG PO TABS: 10.0000 mg | ORAL_TABLET | Freq: Three times a day (TID) | ORAL | 1 refills | Status: DC | PRN

## 2020-03-13 NOTE — Patient Instructions (Signed)
aaaaaaaaaaaaaaaaaaaaaaaaaaaaaaaaaaaaaaaaaaaThird Trimester of Pregnancy  The third trimester is from week 28 through week 40 (months 7 through 9). This trimester is when your unborn baby (fetus) is growing very fast. At the end of the ninth month, the unborn baby is about 20 inches in length. It weighs about 6-10 pounds. Follow these instructions at home: Medicines  Take over-the-counter and prescription medicines only as told by your doctor. Some medicines are safe and some medicines are not safe during pregnancy.  Take a prenatal vitamin that contains at least 600 micrograms (mcg) of folic acid.  If you have trouble pooping (constipation), take medicine that will make your stool soft (stool softener) if your doctor approves. Eating and drinking   Eat regular, healthy meals.  Avoid raw meat and uncooked cheese.  If you get low calcium from the food you eat, talk to your doctor about taking a daily calcium supplement.  Eat four or five small meals rather than three large meals a day.  Avoid foods that are high in fat and sugars, such as fried and sweet foods.  To prevent constipation: ? Eat foods that are high in fiber, like fresh fruits and vegetables, whole grains, and beans. ? Drink enough fluids to keep your pee (urine) clear or pale yellow. Activity  Exercise only as told by your doctor. Stop exercising if you start to have cramps.  Avoid heavy lifting, wear low heels, and sit up straight.  Do not exercise if it is too hot, too humid, or if you are in a place of great height (high altitude).  You may continue to have sex unless your doctor tells you not to. Relieving pain and discomfort  Wear a good support bra if your breasts are tender.  Take frequent breaks and rest with your legs raised if you have leg cramps or low back pain.  Take warm water baths (sitz baths) to soothe pain or discomfort caused by hemorrhoids. Use hemorrhoid cream if your doctor approves.  If  you develop puffy, bulging veins (varicose veins) in your legs: ? Wear support hose or compression stockings as told by your doctor. ? Raise (elevate) your feet for 15 minutes, 3-4 times a day. ? Limit salt in your food. Safety  Wear your seat belt when driving.  Make a list of emergency phone numbers, including numbers for family, friends, the hospital, and police and fire departments. Preparing for your baby's arrival To prepare for the arrival of your baby:  Take prenatal classes.  Practice driving to the hospital.  Visit the hospital and tour the maternity area.  Talk to your work about taking leave once the baby comes.  Pack your hospital bag.  Prepare the baby's room.  Go to your doctor visits.  Buy a rear-facing car seat. Learn how to install it in your car. General instructions  Do not use hot tubs, steam rooms, or saunas.  Do not use any products that contain nicotine or tobacco, such as cigarettes and e-cigarettes. If you need help quitting, ask your doctor.  Do not drink alcohol.  Do not douche or use tampons or scented sanitary pads.  Do not cross your legs for long periods of time.  Do not travel for long distances unless you must. Only do so if your doctor says it is okay.  Visit your dentist if you have not gone during your pregnancy. Use a soft toothbrush to brush your teeth. Be gentle when you floss.  Avoid cat litter boxes and soil  used by cats. These carry germs that can cause birth defects in the baby and can cause a loss of your baby (miscarriage) or stillbirth.  Keep all your prenatal visits as told by your doctor. This is important. Contact a doctor if:  You are not sure if you are in labor or if your water has broken.  You are dizzy.  You have mild cramps or pressure in your lower belly.  You have a nagging pain in your belly area.  You continue to feel sick to your stomach, you throw up, or you have watery poop.  You have bad smelling  fluid coming from your vagina.  You have pain when you pee. Get help right away if:  You have a fever.  You are leaking fluid from your vagina.  You are spotting or bleeding from your vagina.  You have severe belly cramps or pain.  You lose or gain weight quickly.  You have trouble catching your breath and have chest pain.  You notice sudden or extreme puffiness (swelling) of your face, hands, ankles, feet, or legs.  You have not felt the baby move in over an hour.  You have severe headaches that do not go away with medicine.  You have trouble seeing.  You are leaking, or you are having a gush of fluid, from your vagina before you are 37 weeks.  You have regular belly spasms (contractions) before you are 37 weeks. Summary  The third trimester is from week 28 through week 40 (months 7 through 9). This time is when your unborn baby is growing very fast.  Follow your doctor's advice about medicine, food, and activity.  Get ready for the arrival of your baby by taking prenatal classes, getting all the baby items ready, preparing the baby's room, and visiting your doctor to be checked.  Get help right away if you are bleeding from your vagina, or you have chest pain and trouble catching your breath, or if you have not felt your baby move in over an hour. This information is not intended to replace advice given to you by your health care provider. Make sure you discuss any questions you have with your health care provider. Document Revised: 01/20/2019 Document Reviewed: 11/04/2016 Elsevier Patient Education  2020 [employer].
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
## 2020-03-21 ENCOUNTER — Other Ambulatory Visit (HOSPITAL_COMMUNITY)
Admission: RE | Admit: 2020-03-21 | Discharge: 2020-03-21 | Disposition: A | Discharge status: Home / Self Care | Payer: Medicaid Other | Source: Ambulatory Visit | Attending: Obstetrics & Gynecology | Admitting: Obstetrics & Gynecology

## 2020-03-21 ENCOUNTER — Ambulatory Visit (INDEPENDENT_AMBULATORY_CARE_PROVIDER_SITE_OTHER): Payer: Medicaid Other | Admitting: Obstetrics & Gynecology

## 2020-03-21 ENCOUNTER — Other Ambulatory Visit: Payer: Self-pay

## 2020-03-21 VITALS — BP 105/67 | HR 105 | Wt 236.0 lb

## 2020-03-21 DIAGNOSIS — O26893 Other specified pregnancy related conditions, third trimester: Secondary | ICD-10-CM

## 2020-03-21 DIAGNOSIS — Z3480 Encounter for supervision of other normal pregnancy, unspecified trimester: Secondary | ICD-10-CM

## 2020-03-21 DIAGNOSIS — Z3A36 36 weeks gestation of pregnancy: Secondary | ICD-10-CM

## 2020-03-21 DIAGNOSIS — F17200 Nicotine dependence, unspecified, uncomplicated: Secondary | ICD-10-CM

## 2020-03-21 DIAGNOSIS — M5432 Sciatica, left side: Secondary | ICD-10-CM | POA: Diagnosis not present

## 2020-03-21 DIAGNOSIS — O99333 Smoking (tobacco) complicating pregnancy, third trimester: Secondary | ICD-10-CM

## 2020-03-21 MED ORDER — NYSTATIN-TRIAMCINOLONE 100000-0.1 UNIT/GM-% EX OINT
1.0000 | TOPICAL_OINTMENT | Freq: Two times a day (BID) | CUTANEOUS | 0 refills | Status: DC
Start: 2020-03-21 — End: 2020-04-12
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
## 2020-03-21 NOTE — Patient Instructions (Signed)
Third Trimester of Pregnancy The third trimester is from week 28 through week 40 (months 7 through 9). The third trimester is a time when the unborn baby (fetus) is growing rapidly. At the end of the ninth month, the fetus is about 20 inches in length and weighs 6-10 pounds. Body changes during your third trimester Your body will continue to go through many changes during pregnancy. The changes vary from woman to woman. During the third trimester:  Your weight will continue to increase. You can expect to gain 25-35 pounds (11-16 kg) by the end of the pregnancy.  You may begin to get stretch marks on your hips, abdomen, and breasts.  You may urinate more often because the fetus is moving lower into your pelvis and pressing on your bladder.  You may develop or continue to have heartburn. This is caused by increased hormones that slow down muscles in the digestive tract.  You may develop or continue to have constipation because increased hormones slow digestion and cause the muscles that push waste through your intestines to relax.  You may develop hemorrhoids. These are swollen veins (varicose veins) in the rectum that can itch or be painful.  You may develop swollen, bulging veins (varicose veins) in your legs.  You may have increased body aches in the pelvis, back, or thighs. This is due to weight gain and increased hormones that are relaxing your joints.  You may have changes in your hair. These can include thickening of your hair, rapid growth, and changes in texture. Some women also have hair loss during or after pregnancy, or hair that feels dry or thin. Your hair will most likely return to normal after your baby is born.  Your breasts will continue to grow and they will continue to become tender. A yellow fluid (colostrum) may leak from your breasts. This is the first milk you are producing for your baby.  Your belly button may stick out.  You may notice more swelling in your hands,  face, or ankles.  You may have increased tingling or numbness in your hands, arms, and legs. The skin on your belly may also feel numb.  You may feel short of breath because of your expanding uterus.  You may have more problems sleeping. This can be caused by the size of your belly, increased need to urinate, and an increase in your body's metabolism.  You may notice the fetus "dropping," or moving lower in your abdomen (lightening).  You may have increased vaginal discharge.  You may notice your joints feel loose and you may have pain around your pelvic bone. What to expect at prenatal visits You will have prenatal exams every 2 weeks until week 36. Then you will have weekly prenatal exams. During a routine prenatal visit:  You will be weighed to make sure you and the baby are growing normally.  Your blood pressure will be taken.  Your abdomen will be measured to track your baby's growth.  The fetal heartbeat will be listened to.  Any test results from the previous visit will be discussed.  You may have a cervical check near your due date to see if your cervix has softened or thinned (effaced).  You will be tested for Group B streptococcus. This happens between 35 and 37 weeks. Your health care provider may ask you:  What your birth plan is.  How you are feeling.  If you are feeling the baby move.  If you have had any abnormal   symptoms, such as leaking fluid, bleeding, severe headaches, or abdominal cramping.  If you are using any tobacco products, including cigarettes, chewing tobacco, and electronic cigarettes.  If you have any questions. Other tests or screenings that may be performed during your third trimester include:  Blood tests that check for low iron levels (anemia).  Fetal testing to check the health, activity level, and growth of the fetus. Testing is done if you have certain medical conditions or if there are problems during the pregnancy.  Nonstress test  (NST). This test checks the health of your baby to make sure there are no signs of problems, such as the baby not getting enough oxygen. During this test, a belt is placed around your belly. The baby is made to move, and its heart rate is monitored during movement. What is false labor? False labor is a condition in which you feel small, irregular tightenings of the muscles in the womb (contractions) that usually go away with rest, changing position, or drinking water. These are called Braxton Hicks contractions. Contractions may last for hours, days, or even weeks before true labor sets in. If contractions come at regular intervals, become more frequent, increase in intensity, or become painful, you should see your health care provider. What are the signs of labor?  Abdominal cramps.  Regular contractions that start at 10 minutes apart and become stronger and more frequent with time.  Contractions that start on the top of the uterus and spread down to the lower abdomen and back.  Increased pelvic pressure and dull back pain.  A watery or bloody mucus discharge that comes from the vagina.  Leaking of amniotic fluid. This is also known as your "water breaking." It could be a slow trickle or a gush. Let your health care provider know if it has a color or strange odor. If you have any of these signs, call your health care provider right away, even if it is before your due date. Follow these instructions at home: Medicines  Follow your health care provider's instructions regarding medicine use. Specific medicines may be either safe or unsafe to take during pregnancy.  Take a prenatal vitamin that contains at least 600 micrograms (mcg) of folic acid.  If you develop constipation, try taking a stool softener if your health care provider approves. Eating and drinking   Eat a balanced diet that includes fresh fruits and vegetables, whole grains, good sources of protein such as meat, eggs, or tofu,  and low-fat dairy. Your health care provider will help you determine the amount of weight gain that is right for you.  Avoid raw meat and uncooked cheese. These carry germs that can cause birth defects in the baby.  If you have low calcium intake from food, talk to your health care provider about whether you should take a daily calcium supplement.  Eat four or five small meals rather than three large meals a day.  Limit foods that are high in fat and processed sugars, such as fried and sweet foods.  To prevent constipation: ? Drink enough fluid to keep your urine clear or pale yellow. ? Eat foods that are high in fiber, such as fresh fruits and vegetables, whole grains, and beans. Activity  Exercise only as directed by your health care provider. Most women can continue their usual exercise routine during pregnancy. Try to exercise for 30 minutes at least 5 days a week. Stop exercising if you experience uterine contractions.  Avoid heavy lifting.  Do   not exercise in extreme heat or humidity, or at high altitudes.  Wear low-heel, comfortable shoes.  Practice good posture.  You may continue to have sex unless your health care provider tells you otherwise. Relieving pain and discomfort  Take frequent breaks and rest with your legs elevated if you have leg cramps or low back pain.  Take warm sitz baths to soothe any pain or discomfort caused by hemorrhoids. Use hemorrhoid cream if your health care provider approves.  Wear a good support bra to prevent discomfort from breast tenderness.  If you develop varicose veins: ? Wear support pantyhose or compression stockings as told by your healthcare provider. ? Elevate your feet for 15 minutes, 3-4 times a day. Prenatal care  Write down your questions. Take them to your prenatal visits.  Keep all your prenatal visits as told by your health care provider. This is important. Safety  Wear your seat belt at all times when driving.  Make  a list of emergency phone numbers, including numbers for family, friends, the hospital, and police and fire departments. General instructions  Avoid cat litter boxes and soil used by cats. These carry germs that can cause birth defects in the baby. If you have a cat, ask someone to clean the litter box for you.  Do not travel far distances unless it is absolutely necessary and only with the approval of your health care provider.  Do not use hot tubs, steam rooms, or saunas.  Do not drink alcohol.  Do not use any products that contain nicotine or tobacco, such as cigarettes and e-cigarettes. If you need help quitting, ask your health care provider.  Do not use any medicinal herbs or unprescribed drugs. These chemicals affect the formation and growth of the baby.  Do not douche or use tampons or scented sanitary pads.  Do not cross your legs for long periods of time.  To prepare for the arrival of your baby: ? Take prenatal classes to understand, practice, and ask questions about labor and delivery. ? Make a trial run to the hospital. ? Visit the hospital and tour the maternity area. ? Arrange for maternity or paternity leave through employers. ? Arrange for family and friends to take care of pets while you are in the hospital. ? Purchase a rear-facing car seat and make sure you know how to install it in your car. ? Pack your hospital bag. ? Prepare the baby's nursery. Make sure to remove all pillows and stuffed animals from the baby's crib to prevent suffocation.  Visit your dentist if you have not gone during your pregnancy. Use a soft toothbrush to brush your teeth and be gentle when you floss. Contact a health care provider if:  You are unsure if you are in labor or if your water has broken.  You become dizzy.  You have mild pelvic cramps, pelvic pressure, or nagging pain in your abdominal area.  You have lower back pain.  You have persistent nausea, vomiting, or  diarrhea.  You have an unusual or bad smelling vaginal discharge.  You have pain when you urinate. Get help right away if:  Your water breaks before 37 weeks.  You have regular contractions less than 5 minutes apart before 37 weeks.  You have a fever.  You are leaking fluid from your vagina.  You have spotting or bleeding from your vagina.  You have severe abdominal pain or cramping.  You have rapid weight loss or weight gain.  You have   shortness of breath with chest pain.  You notice sudden or extreme swelling of your face, hands, ankles, feet, or legs.  Your baby makes fewer than 10 movements in 2 hours.  You have severe headaches that do not go away when you take medicine.  You have vision changes. Summary  The third trimester is from week 28 through week 40, months 7 through 9. The third trimester is a time when the unborn baby (fetus) is growing rapidly.  During the third trimester, your discomfort may increase as you and your baby continue to gain weight. You may have abdominal, leg, and back pain, sleeping problems, and an increased need to urinate.  During the third trimester your breasts will keep growing and they will continue to become tender. A yellow fluid (colostrum) may leak from your breasts. This is the first milk you are producing for your baby.  False labor is a condition in which you feel small, irregular tightenings of the muscles in the womb (contractions) that eventually go away. These are called Braxton Hicks contractions. Contractions may last for hours, days, or even weeks before true labor sets in.  Signs of labor can include: abdominal cramps; regular contractions that start at 10 minutes apart and become stronger and more frequent with time; watery or bloody mucus discharge that comes from the vagina; increased pelvic pressure and dull back pain; and leaking of amniotic fluid. This information is not intended to replace advice given to you by your  health care provider. Make sure you discuss any questions you have with your health care provider. Document Revised: 01/20/2019 Document Reviewed: 11/04/2016 Elsevier Patient Education  2020 [employer]  

## 2020-03-21 NOTE — Progress Notes (Addendum)
ROB, c/o shooting pain in left leg from hip to toes x 3 weeks, discharge, odor, irritation and dysuria.

## 2020-03-22 LAB — CERVICOVAGINAL ANCILLARY ONLY
Chlamydia: NEGATIVE
Comment: NEGATIVE
Comment: NORMAL
Neisseria Gonorrhea: NEGATIVE

## 2020-03-23 LAB — URINE CULTURE

## 2020-03-26 ENCOUNTER — Other Ambulatory Visit: Payer: Self-pay | Admitting: Obstetrics & Gynecology

## 2020-03-26 ENCOUNTER — Telehealth: Payer: Self-pay | Admitting: Lactation Services

## 2020-03-26 MED ORDER — AMOXICILLIN 500 MG PO CAPS: 500.0000 mg | ORAL_CAPSULE | Freq: Three times a day (TID) | ORAL | 0 refills | Status: DC

## 2020-03-26 NOTE — Telephone Encounter (Signed)
Called patient to inform her of UTI and need to start ATB. Patient did not answer. Lm to call the office for results and to pick up prescription from Pharmacy.   My Chart message sent.

## 2020-03-26 NOTE — Telephone Encounter (Signed)
-----   Message from Malachy Chamber, MD sent at 03/26/2020  2:01 PM EDT ----- GBS uti, amoxicillin sent to pharmacy

## 2020-03-27 ENCOUNTER — Other Ambulatory Visit: Payer: Self-pay

## 2020-03-27 ENCOUNTER — Ambulatory Visit (INDEPENDENT_AMBULATORY_CARE_PROVIDER_SITE_OTHER): Payer: Medicaid Other | Admitting: Obstetrics & Gynecology

## 2020-03-27 ENCOUNTER — Ambulatory Visit: Payer: Medicaid Other | Attending: Obstetrics and Gynecology

## 2020-03-27 ENCOUNTER — Ambulatory Visit: Payer: Medicaid Other | Admitting: *Deleted

## 2020-03-27 ENCOUNTER — Encounter: Payer: Self-pay | Admitting: Obstetrics & Gynecology

## 2020-03-27 VITALS — BP 113/78 | HR 106 | Wt 237.0 lb

## 2020-03-27 VITALS — BP 132/80 | HR 115

## 2020-03-27 DIAGNOSIS — D573 Sickle-cell trait: Secondary | ICD-10-CM | POA: Insufficient documentation

## 2020-03-27 DIAGNOSIS — O99210 Obesity complicating pregnancy, unspecified trimester: Secondary | ICD-10-CM

## 2020-03-27 DIAGNOSIS — O09293 Supervision of pregnancy with other poor reproductive or obstetric history, third trimester: Secondary | ICD-10-CM | POA: Diagnosis not present

## 2020-03-27 DIAGNOSIS — Z3A37 37 weeks gestation of pregnancy: Secondary | ICD-10-CM | POA: Insufficient documentation

## 2020-03-27 DIAGNOSIS — Z362 Encounter for other antenatal screening follow-up: Secondary | ICD-10-CM

## 2020-03-27 DIAGNOSIS — O0990 Supervision of high risk pregnancy, unspecified, unspecified trimester: Secondary | ICD-10-CM

## 2020-03-27 DIAGNOSIS — O0993 Supervision of high risk pregnancy, unspecified, third trimester: Secondary | ICD-10-CM | POA: Insufficient documentation

## 2020-03-27 DIAGNOSIS — O99013 Anemia complicating pregnancy, third trimester: Secondary | ICD-10-CM | POA: Diagnosis not present

## 2020-03-27 DIAGNOSIS — Z862 Personal history of diseases of the blood and blood-forming organs and certain disorders involving the immune mechanism: Secondary | ICD-10-CM

## 2020-03-27 DIAGNOSIS — E669 Obesity, unspecified: Secondary | ICD-10-CM

## 2020-03-27 DIAGNOSIS — O99213 Obesity complicating pregnancy, third trimester: Secondary | ICD-10-CM

## 2020-03-27 DIAGNOSIS — Z6841 Body Mass Index (BMI) 40.0 and over, adult: Secondary | ICD-10-CM

## 2020-03-27 MED ORDER — TRIAMCINOLONE ACETONIDE 0.025 % EX OINT
1.0000 | TOPICAL_OINTMENT | Freq: Two times a day (BID) | CUTANEOUS | 0 refills | Status: DC
Start: 2020-03-27 — End: 2020-04-12

## 2020-03-27 MED ORDER — NYSTATIN 100000 UNIT/GM EX OINT: 1.0000 "application " | TOPICAL_OINTMENT | Freq: Two times a day (BID) | CUTANEOUS | 0 refills | Status: DC

## 2020-03-27 NOTE — Addendum Note (Signed)
Addended by: Raynelle Dick on: 03/27/2020 04:55 PM   Modules accepted: Orders
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
## 2020-03-27 NOTE — Patient Instructions (Signed)
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
## 2020-03-28 ENCOUNTER — Other Ambulatory Visit: Payer: Self-pay | Admitting: Obstetrics & Gynecology

## 2020-03-28 ENCOUNTER — Telehealth: Payer: Self-pay

## 2020-03-28 ENCOUNTER — Other Ambulatory Visit (HOSPITAL_COMMUNITY): Payer: Self-pay | Admitting: Obstetrics & Gynecology

## 2020-03-28 DIAGNOSIS — R8271 Bacteriuria: Secondary | ICD-10-CM

## 2020-03-28 DIAGNOSIS — Z3480 Encounter for supervision of other normal pregnancy, unspecified trimester: Secondary | ICD-10-CM

## 2020-03-28 MED ORDER — LIDO-CAPSAICIN-MEN-METHYL SAL 0.5-0.035-5-20 % EX PTCH: 1.0000 | MEDICATED_PATCH | Freq: Three times a day (TID) | CUTANEOUS | 1 refills | Status: DC | PRN

## 2020-03-28 NOTE — Telephone Encounter (Signed)
Pt called and reports she was supposed to be prescribed some kind of patches to the pharmacy for her neck spasms. I am not seeing anything about a patch, please send if appropriate to pt preferred pharmacy.

## 2020-03-28 NOTE — Telephone Encounter (Signed)
Lidocaine patch sent to pharmacy 

## 2020-04-03 ENCOUNTER — Other Ambulatory Visit: Payer: Self-pay

## 2020-04-03 ENCOUNTER — Ambulatory Visit: Payer: Medicaid Other | Attending: Obstetrics & Gynecology

## 2020-04-03 DIAGNOSIS — M542 Cervicalgia: Secondary | ICD-10-CM | POA: Insufficient documentation

## 2020-04-03 DIAGNOSIS — G8929 Other chronic pain: Secondary | ICD-10-CM | POA: Diagnosis present

## 2020-04-03 DIAGNOSIS — M5442 Lumbago with sciatica, left side: Secondary | ICD-10-CM | POA: Diagnosis present

## 2020-04-04 ENCOUNTER — Encounter: Payer: Self-pay | Admitting: Obstetrics & Gynecology

## 2020-04-04 ENCOUNTER — Ambulatory Visit (INDEPENDENT_AMBULATORY_CARE_PROVIDER_SITE_OTHER): Payer: Medicaid Other | Admitting: Obstetrics & Gynecology

## 2020-04-04 VITALS — BP 129/84 | HR 108 | Wt 239.0 lb

## 2020-04-04 DIAGNOSIS — Z9851 Tubal ligation status: Secondary | ICD-10-CM

## 2020-04-04 DIAGNOSIS — Z3480 Encounter for supervision of other normal pregnancy, unspecified trimester: Secondary | ICD-10-CM

## 2020-04-04 DIAGNOSIS — Z3A38 38 weeks gestation of pregnancy: Secondary | ICD-10-CM

## 2020-04-04 MED ORDER — FLUCONAZOLE 150 MG PO TABS: 150.0000 mg | ORAL_TABLET | Freq: Once | ORAL | 0 refills | Status: AC
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
## 2020-04-04 NOTE — Progress Notes (Signed)
ROB   CC: None    

## 2020-04-04 NOTE — Patient Instructions (Signed)
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
## 2020-04-10 ENCOUNTER — Ambulatory Visit: Payer: Medicaid Other | Admitting: Physical Therapy

## 2020-04-10 ENCOUNTER — Telehealth (INDEPENDENT_AMBULATORY_CARE_PROVIDER_SITE_OTHER): Payer: Medicaid Other | Admitting: Lactation Services

## 2020-04-10 DIAGNOSIS — Z3480 Encounter for supervision of other normal pregnancy, unspecified trimester: Secondary | ICD-10-CM

## 2020-04-10 NOTE — Telephone Encounter (Signed)
Called patient after seeing she had not read her MY Chart message. She reports she did take the ATB prescribed on 6/14 for UTI. Patient with no questions or concerns at this time.

## 2020-04-11 ENCOUNTER — Inpatient Hospital Stay (HOSPITAL_COMMUNITY)
Admission: AD | Admit: 2020-04-11 | Discharge: 2020-04-13 | DRG: 807 | Disposition: A | Discharge status: Home / Self Care | Payer: Medicaid Other | Attending: Obstetrics and Gynecology | Admitting: Obstetrics and Gynecology

## 2020-04-11 ENCOUNTER — Other Ambulatory Visit: Payer: Self-pay

## 2020-04-11 ENCOUNTER — Encounter (HOSPITAL_COMMUNITY): Payer: Self-pay | Admitting: Obstetrics and Gynecology

## 2020-04-11 ENCOUNTER — Encounter: Payer: Medicaid Other | Admitting: Obstetrics and Gynecology

## 2020-04-11 DIAGNOSIS — Z87891 Personal history of nicotine dependence: Secondary | ICD-10-CM | POA: Diagnosis not present

## 2020-04-11 DIAGNOSIS — Z3A39 39 weeks gestation of pregnancy: Secondary | ICD-10-CM | POA: Diagnosis not present

## 2020-04-11 DIAGNOSIS — D573 Sickle-cell trait: Secondary | ICD-10-CM | POA: Diagnosis present

## 2020-04-11 DIAGNOSIS — O9902 Anemia complicating childbirth: Secondary | ICD-10-CM | POA: Diagnosis present

## 2020-04-11 DIAGNOSIS — O99820 Streptococcus B carrier state complicating pregnancy: Secondary | ICD-10-CM | POA: Diagnosis not present

## 2020-04-11 DIAGNOSIS — Z20822 Contact with and (suspected) exposure to covid-19: Secondary | ICD-10-CM | POA: Diagnosis present

## 2020-04-11 DIAGNOSIS — O99824 Streptococcus B carrier state complicating childbirth: Secondary | ICD-10-CM | POA: Diagnosis present

## 2020-04-11 DIAGNOSIS — O26893 Other specified pregnancy related conditions, third trimester: Secondary | ICD-10-CM | POA: Diagnosis present

## 2020-04-11 HISTORY — DX: Trichomoniasis, unspecified: A59.9

## 2020-04-11 HISTORY — DX: Gestational (pregnancy-induced) hypertension without significant proteinuria, unspecified trimester: O13.9

## 2020-04-11 LAB — COMPREHENSIVE METABOLIC PANEL
ALT: 11 U/L (ref 0–44)
AST: 20 U/L (ref 15–41)
Albumin: 3.1 g/dL — ABNORMAL LOW (ref 3.5–5.0)
Alkaline Phosphatase: 134 U/L — ABNORMAL HIGH (ref 38–126)
Anion gap: 10 (ref 5–15)
BUN: <5 mg/dL — ABNORMAL LOW (ref 6–20)
CO2: 21 mmol/L — ABNORMAL LOW (ref 22–32)
Calcium: 9.8 mg/dL (ref 8.9–10.3)
Chloride: 106 mmol/L (ref 98–111)
Creatinine, Ser: 0.73 mg/dL (ref 0.44–1.00)
GFR calc Af Amer: >60 mL/min (ref >60)
GFR calc non Af Amer: >60 mL/min (ref >60)
Glucose, Bld: 126 mg/dL — ABNORMAL HIGH (ref 70–99)
Potassium: 3.5 mmol/L (ref 3.5–5.1)
Sodium: 137 mmol/L (ref 135–145)
Total Bilirubin: 0.3 mg/dL (ref 0.3–1.2)
Total Protein: 6.5 g/dL (ref 6.5–8.1)

## 2020-04-11 LAB — CBC
HCT: 38.1 % (ref 36.0–46.0)
Hemoglobin: 12.4 g/dL (ref 12.0–15.0)
MCH: 27.3 pg (ref 26.0–34.0)
MCHC: 32.5 g/dL (ref 30.0–36.0)
MCV: 83.9 fL (ref 80.0–100.0)
Platelets: 202 10*3/uL (ref 150–400)
RBC: 4.54 MIL/uL (ref 3.87–5.11)
RDW: 16 % — ABNORMAL HIGH (ref 11.5–15.5)
WBC: 12.4 10*3/uL — ABNORMAL HIGH (ref 4.0–10.5)
nRBC: 0.2 % (ref 0.0–0.2)

## 2020-04-11 LAB — SARS CORONAVIRUS 2 BY RT PCR (HOSPITAL ORDER, PERFORMED IN ~~LOC~~ HOSPITAL LAB): SARS Coronavirus 2: NEGATIVE

## 2020-04-11 MED ORDER — SODIUM CHLORIDE 0.9 % IV SOLN: 5.0000 10*6.[IU] | Freq: Once | INTRAVENOUS | Status: DC

## 2020-04-11 MED ORDER — SODIUM CHLORIDE 0.9 % IV SOLN: 2.0000 g | Freq: Once | INTRAVENOUS | Status: AC

## 2020-04-11 MED ORDER — ONDANSETRON HCL 4 MG/2ML IJ SOLN: 4.0000 mg | Freq: Four times a day (QID) | INTRAMUSCULAR | Status: DC | PRN

## 2020-04-11 MED ORDER — SODIUM CHLORIDE 0.9 % IV SOLN
INTRAVENOUS | Status: AC
  Administered 2020-04-11: 2 g via INTRAVENOUS
  Filled 2020-04-11: qty 2000

## 2020-04-11 MED ORDER — OXYTOCIN-SODIUM CHLORIDE 30-0.9 UT/500ML-% IV SOLN
2.5000 [IU]/h | INTRAVENOUS | Status: DC
  Filled 2020-04-11: qty 500

## 2020-04-11 MED ORDER — SIMETHICONE 80 MG PO CHEW
80.0000 mg | CHEWABLE_TABLET | ORAL | Status: DC | PRN
  Administered 2020-04-12: 80 mg via ORAL
  Filled 2020-04-11: qty 1

## 2020-04-11 MED ORDER — FENTANYL CITRATE (PF) 100 MCG/2ML IJ SOLN
50.0000 ug | INTRAMUSCULAR | Status: DC | PRN
  Administered 2020-04-11: 50 ug via INTRAVENOUS

## 2020-04-11 MED ORDER — IBUPROFEN 800 MG PO TABS
ORAL_TABLET | ORAL | Status: AC
  Administered 2020-04-11: 800 mg via ORAL
  Filled 2020-04-11: qty 1

## 2020-04-11 MED ORDER — ACETAMINOPHEN 325 MG PO TABS
650.0000 mg | ORAL_TABLET | ORAL | Status: DC | PRN
  Administered 2020-04-11 – 2020-04-12 (×2): 650 mg via ORAL
  Filled 2020-04-11 (×2): qty 2

## 2020-04-11 MED ORDER — LACTATED RINGERS IV SOLN: INTRAVENOUS | Status: DC

## 2020-04-11 MED ORDER — PRENATAL MULTIVITAMIN CH
1.0000 | ORAL_TABLET | Freq: Every day | ORAL | Status: DC
  Administered 2020-04-12 – 2020-04-13 (×2): 1 via ORAL
  Filled 2020-04-11 (×2): qty 1

## 2020-04-11 MED ORDER — BENZOCAINE-MENTHOL 20-0.5 % EX AERO
1.0000 "application " | INHALATION_SPRAY | CUTANEOUS | Status: DC | PRN
  Administered 2020-04-11 – 2020-04-13 (×2): 1 via TOPICAL
  Filled 2020-04-11 (×2): qty 56

## 2020-04-11 MED ORDER — LACTATED RINGERS IV SOLN: 500.0000 mL | INTRAVENOUS | Status: DC | PRN

## 2020-04-11 MED ORDER — ONDANSETRON HCL 4 MG/2ML IJ SOLN: 4.0000 mg | INTRAMUSCULAR | Status: DC | PRN

## 2020-04-11 MED ORDER — ACETAMINOPHEN 325 MG PO TABS: 650.0000 mg | ORAL_TABLET | ORAL | Status: DC | PRN

## 2020-04-11 MED ORDER — SOD CITRATE-CITRIC ACID 500-334 MG/5ML PO SOLN: 30.0000 mL | ORAL | Status: DC | PRN

## 2020-04-11 MED ORDER — ZOLPIDEM TARTRATE 5 MG PO TABS: 5.0000 mg | ORAL_TABLET | Freq: Every evening | ORAL | Status: DC | PRN

## 2020-04-11 MED ORDER — MEASLES, MUMPS & RUBELLA VAC IJ SOLR: 0.5000 mL | Freq: Once | INTRAMUSCULAR | Status: DC

## 2020-04-11 MED ORDER — OXYCODONE HCL 5 MG PO TABS
10.0000 mg | ORAL_TABLET | ORAL | Status: DC | PRN
  Administered 2020-04-11: 10 mg via ORAL
  Filled 2020-04-11: qty 2

## 2020-04-11 MED ORDER — PENICILLIN G POT IN DEXTROSE 60000 UNIT/ML IV SOLN: 3.0000 10*6.[IU] | INTRAVENOUS | Status: DC

## 2020-04-11 MED ORDER — WITCH HAZEL-GLYCERIN EX PADS: 1.0000 "application " | MEDICATED_PAD | CUTANEOUS | Status: DC | PRN

## 2020-04-11 MED ORDER — OXYCODONE-ACETAMINOPHEN 5-325 MG PO TABS: 2.0000 | ORAL_TABLET | ORAL | Status: DC | PRN

## 2020-04-11 MED ORDER — ONDANSETRON HCL 4 MG PO TABS
4.0000 mg | ORAL_TABLET | ORAL | Status: DC | PRN
  Administered 2020-04-12: 4 mg via ORAL
  Filled 2020-04-11: qty 1

## 2020-04-11 MED ORDER — COCONUT OIL OIL: 1.0000 "application " | TOPICAL_OIL | Status: DC | PRN

## 2020-04-11 MED ORDER — IBUPROFEN 800 MG PO TABS: 800.0000 mg | ORAL_TABLET | Freq: Once | ORAL | Status: AC

## 2020-04-11 MED ORDER — DIBUCAINE (PERIANAL) 1 % EX OINT: 1.0000 "application " | TOPICAL_OINTMENT | CUTANEOUS | Status: DC | PRN

## 2020-04-11 MED ORDER — OXYCODONE HCL 5 MG PO TABS: 5.0000 mg | ORAL_TABLET | ORAL | Status: DC | PRN

## 2020-04-11 MED ORDER — SENNOSIDES-DOCUSATE SODIUM 8.6-50 MG PO TABS
2.0000 | ORAL_TABLET | ORAL | Status: DC
  Administered 2020-04-12 (×2): 2 via ORAL
  Filled 2020-04-11 (×2): qty 2

## 2020-04-11 MED ORDER — IBUPROFEN 600 MG PO TABS
600.0000 mg | ORAL_TABLET | Freq: Four times a day (QID) | ORAL | Status: DC
  Administered 2020-04-11 – 2020-04-13 (×8): 600 mg via ORAL
  Filled 2020-04-11 (×8): qty 1

## 2020-04-11 MED ORDER — FENTANYL CITRATE (PF) 100 MCG/2ML IJ SOLN
100.0000 ug | INTRAMUSCULAR | Status: DC | PRN
  Filled 2020-04-11: qty 2

## 2020-04-11 MED ORDER — OXYTOCIN BOLUS FROM INFUSION
333.0000 mL | Freq: Once | INTRAVENOUS | Status: AC
  Administered 2020-04-11: 333 mL via INTRAVENOUS

## 2020-04-11 MED ORDER — OXYCODONE-ACETAMINOPHEN 5-325 MG PO TABS
1.0000 | ORAL_TABLET | ORAL | Status: DC | PRN
  Administered 2020-04-11: 1 via ORAL
  Filled 2020-04-11: qty 1

## 2020-04-11 MED ORDER — LIDOCAINE HCL (PF) 1 % IJ SOLN: 30.0000 mL | INTRAMUSCULAR | Status: DC | PRN

## 2020-04-11 MED ORDER — TETANUS-DIPHTH-ACELL PERTUSSIS 5-2.5-18.5 LF-MCG/0.5 IM SUSP: 0.5000 mL | Freq: Once | INTRAMUSCULAR | Status: DC

## 2020-04-11 MED ORDER — DIPHENHYDRAMINE HCL 25 MG PO CAPS: 25.0000 mg | ORAL_CAPSULE | Freq: Four times a day (QID) | ORAL | Status: DC | PRN

## 2020-04-11 NOTE — Progress Notes (Signed)
Patient complaining of some chest tightness and shortness of breath when ambulating; patient reports that it resolves and she feels comfortable when in bed and has been going on since delivery. MD made aware on dayshift. Assessment and VS WNL. Dr. Crissie Reese notified of current patient status at 2120 and requests to do a CBC on 7/1 at 0500.

## 2020-04-11 NOTE — MAU Note (Signed)
EMS arrival, contracting x3 hrs. No bleeding or leaking.  BP was 139/90 per EMS, 'tachy'. Pt very uncomfortable.  Asking for 'something light to take the edge off'.

## 2020-04-11 NOTE — Discharge Summary (Signed)
Postpartum Discharge Summary     Patient Name: Katelyn Mcfarland DOB: 09/20/1991 MRN: [id number]  Date of admission: 04/11/2020 Delivery date:04/11/2020  Delivering provider: Nicolette Bang  Date of discharge: 04/13/2020  Admitting diagnosis: Indication for care in labor or delivery [O75.9] Intrauterine pregnancy: [redacted]w[redacted]d    Secondary diagnosis:  Active Problems:   Indication for care in labor or delivery  Additional problems: previous failed BTL    Discharge diagnosis: Term Pregnancy Delivered                                              Post partum procedures:none Augmentation: N/A Complications: None  Hospital course: Onset of Labor With Vaginal Delivery      29y.o. yo [id number] 31w5das admitted in Active Labor on 04/11/2020. Patient had an uncomplicated labor course as follows:  Membrane Rupture Time/Date: 12:17 PM ,04/11/2020   Delivery Method:Vaginal, Spontaneous  Episiotomy: None  Lacerations:  None  Patient had an uncomplicated postpartum course.  She is ambulating, tolerating a regular diet, passing flatus, and urinating well. Patient is discharged home in stable condition on 04/13/20.  Newborn Data: Birth date:04/11/2020  Birth time:12:54 PM  Gender:Female  Living status:Living  Apgars:8 ,9  Weight:3671 g   Magnesium Sulfate received: No BMZ received: No Rhophylac:N/A MMR:N/A T-DaP:Given postpartum Flu: N/A Transfusion:No  Physical exam  Vitals:   04/12/20 0340 04/12/20 1700 04/12/20 2203 04/13/20 0433  BP: 118/76 116/76 127/81 124/79  Pulse: (!) 110 (!) 120 (!) 114 (!) 109  Resp: 18 17 18 17   Temp: 97.8 F (36.6 C) 98.3 F (36.8 C) 98.7 F (37.1 C) 98.9 F (37.2 C)  TempSrc: Oral Oral Oral Oral  SpO2: 100%  97% 97%   General: alert, cooperative and no distress Lochia: appropriate Uterine Fundus: firm Incision: N/A DVT Evaluation: No evidence of DVT seen on physical exam. Negative Homan's sign. No cords or calf  tenderness. Labs: Lab Results  Component Value Date   WBC 14.1 (H) 04/12/2020   HGB 9.7 (L) 04/12/2020   HCT 30.5 (L) 04/12/2020   MCV 84.0 04/12/2020   PLT 166 04/12/2020   CMP Latest Ref Rng & Units 04/11/2020  Glucose 70 - 99 mg/dL 126(H)  BUN 6 - 20 mg/dL <5(L)  Creatinine 0.44 - 1.00 mg/dL 0.73  Sodium 135 - 145 mmol/L 137  Potassium 3.5 - 5.1 mmol/L 3.5  Chloride 98 - 111 mmol/L 106  CO2 22 - 32 mmol/L 21(L)  Calcium 8.9 - 10.3 mg/dL 9.8  Total Protein 6.5 - 8.1 g/dL 6.5  Total Bilirubin 0.3 - 1.2 mg/dL 0.3  Alkaline Phos 38 - 126 U/L 134(H)  AST 15 - 41 U/L 20  ALT 0 - 44 U/L 11   Edinburgh Score: Edinburgh Postnatal Depression Scale Screening Tool 04/11/2020  I have been able to laugh and see the funny side of things. 0  I have looked forward with enjoyment to things. 0  I have blamed myself unnecessarily when things went wrong. 0  I have been anxious or worried for no good reason. 0  I have felt scared or panicky for no good reason. 0  Things have been getting on top of me. 0  I have been so unhappy that I have had difficulty sleeping. 0  I have felt sad or miserable. 0  I have been so  unhappy that I have been crying. 0  The thought of harming myself has occurred to me. 0  Edinburgh Postnatal Depression Scale Total 0     After visit meds:  Allergies as of 04/13/2020   No Known Allergies     Medication List    STOP taking these medications   aspirin EC 81 MG tablet   Blood Pressure Kit Chemical engineer Maternity Supp Sm Misc   cyclobenzaprine 5 MG tablet Commonly known as: FLEXERIL   Lido-Capsaicin-Men-Methyl Sal 0.5-0.035-5-20 % Ptch   ondansetron 4 MG disintegrating tablet Commonly known as: ZOFRAN-ODT   Vitafol Gummies 3.33-0.333-34.8 MG Chew     TAKE these medications   acetaminophen 500 MG tablet Commonly known as: TYLENOL Take 1,000 mg by mouth every 6 (six) hours as needed for mild pain.   ibuprofen 600 MG tablet Commonly known as:  ADVIL Take 1 tablet (600 mg total) by mouth every 6 (six) hours.        Discharge home in stable condition Infant Feeding: No evidence of DVT seen on physical exam. Negative Homan's sign. No cords or calf tenderness. Infant Disposition:home with mother Discharge instruction: per After Visit Summary and Postpartum booklet. Activity: Advance as tolerated. Pelvic rest for 6 weeks.  Diet: routine diet Future Appointments:No future appointments. Follow up Visit:  [facility]. Schedule an appointment as soon as possible for a visit in 4 week(s).   Specialty: Obstetrics and Gynecology Why:  for postpartum visit/tubal preop Contact information: [address] [place] [phone]               Please schedule this patient for a In person postpartum visit in 4 weeks with the following provider: Any provider. Additional Postpartum F/U:plans interval salpingectomy, needs preop   Low risk pregnancy complicated by: history of gHTN on ASA therapy, sickle cell trait, GBS+ urine culture Delivery mode:  Vaginal, Spontaneous  Anticipated Birth Control:  Plans Interval BTL   04/13/2020 [name], CNM
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
## 2020-04-11 NOTE — MAU Note (Signed)
Pt refusing IV, wanting special team called (repeatedly asked for this), "hard stick multiple times, team has to be called"

## 2020-04-12 LAB — CBC
HCT: 30.5 % — ABNORMAL LOW (ref 36.0–46.0)
Hemoglobin: 9.7 g/dL — ABNORMAL LOW (ref 12.0–15.0)
MCH: 26.7 pg (ref 26.0–34.0)
MCHC: 31.8 g/dL (ref 30.0–36.0)
MCV: 84 fL (ref 80.0–100.0)
Platelets: 166 10*3/uL (ref 150–400)
RBC: 3.63 MIL/uL — ABNORMAL LOW (ref 3.87–5.11)
RDW: 16.1 % — ABNORMAL HIGH (ref 11.5–15.5)
WBC: 14.1 10*3/uL — ABNORMAL HIGH (ref 4.0–10.5)
nRBC: 0 % (ref 0.0–0.2)

## 2020-04-12 LAB — RPR: RPR Ser Ql: NONREACTIVE

## 2020-04-12 MED ORDER — CALCIUM CARBONATE ANTACID 500 MG PO CHEW
1.0000 | CHEWABLE_TABLET | Freq: Four times a day (QID) | ORAL | Status: DC | PRN
  Administered 2020-04-12 – 2020-04-13 (×2): 200 mg via ORAL
  Filled 2020-04-12 (×2): qty 1

## 2020-04-12 NOTE — Discharge Instructions (Signed)

## 2020-04-12 NOTE — Progress Notes (Addendum)
  CSW received consult for hx of marijuana use.  Referral was screened out due to the following: ~MOB had no documented substance use after initial prenatal visit/+UPT. ~MOB had no positive drug screens after initial prenatal visit/+UPT. ~Baby's UDS is negative.  Please consult CSW if current concerns arise or by MOB's request.  CSW will monitor CDS results and make report to Child Protective  Services if warranted.    MOB was referred for history of depression/anxiety. * Referral screened out by Clinical Social Worker because none of the following criteria appear to apply: ~ History of anxiety/depression during this pregnancy, or of post-partum depression following prior delivery. ~ Diagnosis of anxiety and/or depression within last 3 years. Per further chart review it appears that MOB was diagnosed with depression in 2014 or prior to 2014.  OR * MOB's symptoms currently being treated with medication and/or therapy.   Claude Manges Ayah Cozzolino, MSW, LCSW [facility] [phone]

## 2020-04-12 NOTE — Progress Notes (Signed)
Post Partum Day #1 Subjective: no complaints, up ad lib and tolerating PO; breastfeeding going okay; pt is concerned re infant spitting up; desires salpingectomy; desires circ for her son  Objective: Blood pressure 118/76, pulse (!) 110, temperature 97.8 F (36.6 C), temperature source Oral, resp. rate 18, last menstrual period 07/08/2019, SpO2 100 %, unknown if currently breastfeeding.  Physical Exam:  General: cooperative, fatigued and no distress Lochia: appropriate Uterine Fundus: firm DVT Evaluation: No evidence of DVT seen on physical exam.  Recent Labs    04/11/20 1249 04/12/20 0524  HGB 12.4 9.7*  HCT 38.1 30.5*    Assessment/Plan: Plan for discharge tomorrow, Circumcision prior to discharge and Contraception appt for interval salpingectomy   CNM Circumcision Counseling Progress Note  Patient desires circumcision for her female infant.  Circumcision procedure details discussed, risks and benefits of procedure were also discussed.  These include but are not limited to: Benefits of circumcision in men include reduction in the rates of urinary tract infection (UTI), penile cancer, some sexually transmitted infections, penile inflammatory and retractile disorders, as well as easier hygiene.  Risks include bleeding , infection, injury of glans which may lead to penile deformity or urinary tract issues, unsatisfactory cosmetic appearance and other potential complications related to the procedure.  It was emphasized that this is an elective procedure.  Patient wants to proceed with circumcision; written informed consent will be obtained.  Will have MD do circumcision when infant is cleared for such by peds.    LOS: 1 day   Katelyn Mcfarland CNM 04/12/2020, 6:56 AM

## 2020-04-13 MED ORDER — IBUPROFEN 600 MG PO TABS: 600.0000 mg | ORAL_TABLET | Freq: Four times a day (QID) | ORAL | 0 refills | Status: DC

## 2020-04-13 NOTE — Progress Notes (Signed)
Patient is extremely tired. No complaints at this time or chest pain. Mother told about infant safe sleep twice. Mother was holding infant asleep.

## 2020-04-13 NOTE — Lactation Note (Addendum)
This note was copied from a baby's chart. Lactation Consultation Note  Patient Name: Katelyn Mcfarland IOEVO'J Date: 04/13/2020 Reason for consult: Initial assessment   P4, Baby 46 hours old and mother complaining of nipple soreness.   Mother recently gave baby bottle of formula.  Mother is breastfeeding and supplementing with formula. Mother breastfed her other children for 2 weeks - 14mos. She states she stopped due to what she described as rusty pipe.  Provided education in case in happens again.  Suggest mother call for [facility] assistance with next feeding. Provided mother with comfort gels for nipple soreness so LC can evaluate sore nipples. Mother states she is having more difficulty latching on R side. Had mother prepump with manual pump to help evert nipple. Feed on demand with cues.  Goal 8-12+ times per day after first 24 hrs.  Place baby STS if not cueing.  Mom made aware of O/P services, breastfeeding support groups, community resources, and our phone # for post-discharge questions.      Maternal Data Has patient been taught Hand Expression?: Yes Does the patient have breastfeeding experience prior to this delivery?: Yes  Feeding    LATCH Score                   Interventions Interventions: Breast feeding basics reviewed;Comfort gels;Hand pump;DEBP;Hand express  Lactation Tools Discussed/Used     Consult Status Consult Status: Follow-up Date: 04/14/20 Follow-up type: In-patient    Dahlia [facility]es [facility] 04/13/2020, 11:19 AM

## 2020-04-14 LAB — BPAM RBC
Blood Product Expiration Date: 202108052359
Blood Product Expiration Date: 202108052359
Unit Type and Rh: 9500
Unit Type and Rh: 9500

## 2020-04-14 LAB — TYPE AND SCREEN
ABO/RH(D): O POS
Antibody Screen: POSITIVE
Unit division: 0
Unit division: 0

## 2020-04-16 ENCOUNTER — Inpatient Hospital Stay (HOSPITAL_COMMUNITY): Payer: Medicaid Other

## 2020-04-16 ENCOUNTER — Other Ambulatory Visit: Payer: Self-pay

## 2020-04-16 ENCOUNTER — Encounter (HOSPITAL_COMMUNITY): Payer: Self-pay | Admitting: Obstetrics & Gynecology

## 2020-04-16 ENCOUNTER — Inpatient Hospital Stay (HOSPITAL_COMMUNITY)
Admission: AD | Admit: 2020-04-16 | Discharge: 2020-04-16 | Disposition: A | Discharge status: Home / Self Care | Payer: Medicaid Other | Attending: Obstetrics & Gynecology | Admitting: Obstetrics & Gynecology

## 2020-04-16 DIAGNOSIS — O165 Unspecified maternal hypertension, complicating the puerperium: Secondary | ICD-10-CM

## 2020-04-16 DIAGNOSIS — Z791 Long term (current) use of non-steroidal anti-inflammatories (NSAID): Secondary | ICD-10-CM | POA: Insufficient documentation

## 2020-04-16 DIAGNOSIS — Z79899 Other long term (current) drug therapy: Secondary | ICD-10-CM | POA: Insufficient documentation

## 2020-04-16 DIAGNOSIS — O9089 Other complications of the puerperium, not elsewhere classified: Secondary | ICD-10-CM | POA: Diagnosis present

## 2020-04-16 DIAGNOSIS — E7800 Pure hypercholesterolemia, unspecified: Secondary | ICD-10-CM | POA: Insufficient documentation

## 2020-04-16 DIAGNOSIS — Z87891 Personal history of nicotine dependence: Secondary | ICD-10-CM | POA: Insufficient documentation

## 2020-04-16 DIAGNOSIS — R102 Pelvic and perineal pain: Secondary | ICD-10-CM | POA: Diagnosis not present

## 2020-04-16 DIAGNOSIS — O99893 Other specified diseases and conditions complicating puerperium: Secondary | ICD-10-CM | POA: Diagnosis not present

## 2020-04-16 DIAGNOSIS — N949 Unspecified condition associated with female genital organs and menstrual cycle: Secondary | ICD-10-CM

## 2020-04-16 DIAGNOSIS — Z87440 Personal history of urinary (tract) infections: Secondary | ICD-10-CM | POA: Insufficient documentation

## 2020-04-16 DIAGNOSIS — O360111 Maternal care for anti-D [Rh] antibodies, first trimester, fetus 1: Secondary | ICD-10-CM

## 2020-04-16 LAB — CBC WITH DIFFERENTIAL/PLATELET
Abs Immature Granulocytes: 0.19 10*3/uL — ABNORMAL HIGH (ref 0.00–0.07)
Basophils Absolute: 0 10*3/uL (ref 0.0–0.1)
Basophils Relative: 0 %
Eosinophils Absolute: 0.2 10*3/uL (ref 0.0–0.5)
Eosinophils Relative: 2 %
HCT: 31.5 % — ABNORMAL LOW (ref 36.0–46.0)
Hemoglobin: 9.9 g/dL — ABNORMAL LOW (ref 12.0–15.0)
Immature Granulocytes: 2 %
Lymphocytes Relative: 20 %
Lymphs Abs: 2.3 10*3/uL (ref 0.7–4.0)
MCH: 26.9 pg (ref 26.0–34.0)
MCHC: 31.4 g/dL (ref 30.0–36.0)
MCV: 85.6 fL (ref 80.0–100.0)
Monocytes Absolute: 0.8 10*3/uL (ref 0.1–1.0)
Monocytes Relative: 7 %
Neutro Abs: 7.9 10*3/uL — ABNORMAL HIGH (ref 1.7–7.7)
Neutrophils Relative %: 69 %
Platelets: 256 10*3/uL (ref 150–400)
RBC: 3.68 MIL/uL — ABNORMAL LOW (ref 3.87–5.11)
RDW: 16.6 % — ABNORMAL HIGH (ref 11.5–15.5)
WBC: 11.5 10*3/uL — ABNORMAL HIGH (ref 4.0–10.5)
nRBC: 0 % (ref 0.0–0.2)

## 2020-04-16 LAB — URINALYSIS, ROUTINE W REFLEX MICROSCOPIC
Bacteria, UA: NONE SEEN
Bilirubin Urine: NEGATIVE
Bilirubin Urine: NEGATIVE
Glucose, UA: NEGATIVE mg/dL
Glucose, UA: NEGATIVE mg/dL
Ketones, ur: NEGATIVE mg/dL
Ketones, ur: 5 mg/dL — AB
Leukocytes,Ua: NEGATIVE
Nitrite: NEGATIVE
Nitrite: NEGATIVE
Protein, ur: 100 mg/dL — AB
Protein, ur: NEGATIVE mg/dL
Specific Gravity, Urine: 1.008 (ref 1.005–1.030)
Specific Gravity, Urine: 1.015 (ref 1.005–1.030)
WBC, UA: >50 WBC/hpf — ABNORMAL HIGH (ref 0–5)
pH: 7 (ref 5.0–8.0)
pH: 6 (ref 5.0–8.0)

## 2020-04-16 LAB — COMPREHENSIVE METABOLIC PANEL
ALT: 32 U/L (ref 0–44)
AST: 31 U/L (ref 15–41)
Albumin: 2.9 g/dL — ABNORMAL LOW (ref 3.5–5.0)
Alkaline Phosphatase: 82 U/L (ref 38–126)
Anion gap: 12 (ref 5–15)
BUN: 5 mg/dL — ABNORMAL LOW (ref 6–20)
CO2: 23 mmol/L (ref 22–32)
Calcium: 8.7 mg/dL — ABNORMAL LOW (ref 8.9–10.3)
Chloride: 105 mmol/L (ref 98–111)
Creatinine, Ser: 0.75 mg/dL (ref 0.44–1.00)
GFR calc Af Amer: >60 mL/min (ref >60)
GFR calc non Af Amer: >60 mL/min (ref >60)
Glucose, Bld: 80 mg/dL (ref 70–99)
Potassium: 3.7 mmol/L (ref 3.5–5.1)
Sodium: 140 mmol/L (ref 135–145)
Total Bilirubin: 0.5 mg/dL (ref 0.3–1.2)
Total Protein: 6.6 g/dL (ref 6.5–8.1)

## 2020-04-16 LAB — PROTEIN / CREATININE RATIO, URINE
Creatinine, Urine: 201.59 mg/dL
Protein Creatinine Ratio: 0.04 mg/mg{Cre} (ref 0.00–0.15)
Total Protein, Urine: 8 mg/dL

## 2020-04-16 MED ORDER — OXYCODONE-ACETAMINOPHEN 5-325 MG PO TABS
2.0000 | ORAL_TABLET | Freq: Once | ORAL | Status: AC
  Administered 2020-04-16: 2 via ORAL
  Filled 2020-04-16: qty 2

## 2020-04-16 MED ORDER — AMOXICILLIN-POT CLAVULANATE 875-125 MG PO TABS
1.0000 | ORAL_TABLET | Freq: Two times a day (BID) | ORAL | 0 refills | Status: DC
Start: 2020-04-16 — End: 2020-08-13

## 2020-04-16 MED ORDER — IBUPROFEN 800 MG PO TABS
800.0000 mg | ORAL_TABLET | Freq: Once | ORAL | Status: AC
  Administered 2020-04-16: 800 mg via ORAL
  Filled 2020-04-16: qty 1

## 2020-04-16 MED ORDER — NIFEDIPINE ER OSMOTIC RELEASE 30 MG PO TB24
30.0000 mg | ORAL_TABLET | Freq: Once | ORAL | Status: AC
  Administered 2020-04-16: 30 mg via ORAL
  Filled 2020-04-16: qty 1

## 2020-04-16 MED ORDER — NIFEDIPINE ER OSMOTIC RELEASE 30 MG PO TB24
30.0000 mg | ORAL_TABLET | Freq: Every day | ORAL | 1 refills | Status: DC
Start: 2020-04-16 — End: 2020-08-13

## 2020-04-16 NOTE — MAU Provider Note (Addendum)
History     CSN: [id number]  Arrival date and time: 04/16/20 1540   First Provider Initiated Contact with Patient 04/16/20 1806      Chief Complaint  Patient presents with  . Abdominal Pain   Pt is 5 days PP, came in complaining of sharp pelvic pain that started around 2pm after a pumping session. It was crampy/ctx like, 9/10 pain that made it difficult to stand upright. She took one flexeril which eased the pain enough for her to stand so she came in. She denies excessive bleeding or fever. She stopped having afterpains on day 3.  Abdominal Pain This is a new problem. The current episode started today. The onset quality is sudden. The problem occurs intermittently. The problem has been waxing and waning. The pain is located in the suprapubic region. The pain is at a severity of 9/10. The pain is severe. The quality of the pain is cramping and sharp. The abdominal pain does not radiate. Pertinent negatives include no constipation, diarrhea, fever, headaches, nausea or vomiting. The pain is aggravated by movement, palpation and certain positions. The pain is relieved by being still (flexeril helped somewhat). Treatments tried: muscle relaxer. The treatment provided mild relief. Prior diagnostic workup includes ultrasound. failed BTL with filshie clips    Past Medical History:  Diagnosis Date  . Allergy   . Chlamydia    age 29yo; hx/o trichomonas age 46yo  . Chronic headache   . Depression    doing ok, declined meds  . High cholesterol   . Infection    UTI  . Pregnancy induced hypertension   . Trichomonas vaginalis infection   . Vaginal Pap smear, abnormal    bx, cryo    Past Surgical History:  Procedure Laterality Date  . INDUCED ABORTION    . TUBAL LIGATION Bilateral 11/15/2018   Procedure: POST PARTUM TUBAL LIGATION;  Surgeon: Hermina Staggers, MD;  Location: [facility] BIRTHING SUITES;  Service: Gynecology;  Laterality: Bilateral;  . WISDOM TOOTH EXTRACTION      Family History   Problem Relation Age of Onset  . Hypertension Mother   . Diabetes Mother   . Asthma Sister   . Cancer Paternal Grandmother   . Cancer Paternal Grandfather   . Cancer Maternal Grandmother   . Kidney disease Maternal Grandmother   . Diabetes Maternal Grandmother   . Heart disease Neg Hx   . Stroke Neg Hx     Social History   Tobacco Use  . Smoking status: Former Smoker    Packs/day: 1.00    Years: 16.00    Pack years: 16.00    Types: Cigarettes    Start date: 10/13/2000  . Smokeless tobacco: Never Used  . Tobacco comment: stopped August 2020  Vaping Use  . Vaping Use: Never used  Substance Use Topics  . Alcohol use: Not Currently    Comment: not currently, socially  . Drug use: Not Currently    Types: Marijuana    Comment: last was october 2020    Allergies: No Known Allergies  Medications Prior to Admission  Medication Sig Dispense Refill Last Dose  . cyclobenzaprine (FLEXERIL) 5 MG tablet Take 5 mg by mouth 3 (three) times daily as needed for muscle spasms.   04/16/2020 at 1400  . ibuprofen (ADVIL) 600 MG tablet Take 1 tablet (600 mg total) by mouth every 6 (six) hours. 30 tablet 0 04/15/2020 at Unknown time  . acetaminophen (TYLENOL) 500 MG tablet Take 1,000 mg by mouth  every 6 (six) hours as needed for mild pain.       Review of Systems  Constitutional: Negative for diaphoresis, fatigue and fever.  HENT: Negative.   Eyes: Negative for photophobia and visual disturbance.  Respiratory: Negative for cough, chest tightness and shortness of breath.   Cardiovascular: Negative for chest pain.  Gastrointestinal: Negative for abdominal pain, constipation, diarrhea, nausea, rectal pain and vomiting.  Endocrine: Negative.   Genitourinary: Positive for pelvic pain. Negative for vaginal discharge and vaginal pain.  Musculoskeletal: Negative.   Skin: Negative.   Allergic/Immunologic: Negative.   Neurological: Negative for dizziness, syncope, light-headedness and headaches.   Hematological: Negative.   Psychiatric/Behavioral: Negative.    Physical Exam   Blood pressure (!) 143/91, pulse (!) 102, temperature 99.2 F (37.3 C), temperature source Oral, resp. rate 20, last menstrual period 07/08/2019, SpO2 96 %, currently breastfeeding.  Physical Exam Vitals and nursing note reviewed.  Constitutional:      Appearance: She is well-developed.  HENT:     Head: Normocephalic.     Mouth/Throat:     Mouth: Mucous membranes are moist.     Pharynx: Oropharynx is clear.  Eyes:     Extraocular Movements: Extraocular movements intact.  Cardiovascular:     Rate and Rhythm: Normal rate and regular rhythm.     Heart sounds: Normal heart sounds.  Pulmonary:     Effort: Pulmonary effort is normal.     Breath sounds: Normal breath sounds.  Abdominal:     General: Bowel sounds are normal.     Palpations: Abdomen is soft.     Tenderness: There is abdominal tenderness in the suprapubic area.  Genitourinary:    Uterus: Enlarged.      Adnexa: Right adnexa normal and left adnexa normal.  Skin:    General: Skin is warm and dry.     Capillary Refill: Capillary refill takes less than 2 seconds.  Neurological:     Mental Status: She is alert and oriented to person, place, and time.  Psychiatric:        Mood and Affect: Mood normal.        Behavior: Behavior normal.     MAU Course  Procedures Results for orders placed or performed during the hospital encounter of 04/16/20 (from the past 24 hour(s))  Urinalysis, Routine w reflex microscopic     Status: Abnormal   Collection Time: 04/16/20  5:48 PM  Result Value Ref Range   Color, Urine ORANGE (A) YELLOW   APPearance HAZY (A) CLEAR   Specific Gravity, Urine 1.008 1.005 - 1.030   pH 7.0 5.0 - 8.0   Glucose, UA NEGATIVE NEGATIVE mg/dL   Hgb urine dipstick LARGE (A) NEGATIVE   Bilirubin Urine NEGATIVE NEGATIVE   Ketones, ur NEGATIVE NEGATIVE mg/dL   Protein, ur 409100 (A) NEGATIVE mg/dL   Nitrite NEGATIVE NEGATIVE    Leukocytes,Ua LARGE (A) NEGATIVE   RBC / HPF 0-5 0 - 5 RBC/hpf   WBC, UA >50 (H) 0 - 5 WBC/hpf   Bacteria, UA MANY (A) NONE SEEN   Squamous Epithelial / LPF 21-50 0 - 5   WBC Clumps PRESENT    Mucus PRESENT    Hyaline Casts, UA PRESENT    Non Squamous Epithelial 0-5 (A) NONE SEEN  CBC with Differential/Platelet     Status: Abnormal   Collection Time: 04/16/20  6:54 PM  Result Value Ref Range   WBC 11.5 (H) 4.0 - 10.5 K/uL   RBC 3.68 (L) 3.87 -  5.11 MIL/uL   Hemoglobin 9.9 (L) 12.0 - 15.0 g/dL   HCT 25.4 (L) 36 - 46 %   MCV 85.6 80.0 - 100.0 fL   MCH 26.9 26.0 - 34.0 pg   MCHC 31.4 30.0 - 36.0 g/dL   RDW 27.0 (H) 62.3 - 76.2 %   Platelets 256 150 - 400 K/uL   nRBC 0.0 0.0 - 0.2 %   Neutrophils Relative % 69 %   Neutro Abs 7.9 (H) 1.7 - 7.7 K/uL   Lymphocytes Relative 20 %   Lymphs Abs 2.3 0.7 - 4.0 K/uL   Monocytes Relative 7 %   Monocytes Absolute 0.8 0 - 1 K/uL   Eosinophils Relative 2 %   Eosinophils Absolute 0.2 0 - 0 K/uL   Basophils Relative 0 %   Basophils Absolute 0.0 0 - 0 K/uL   Immature Granulocytes 2 %   Abs Immature Granulocytes 0.19 (H) 0.00 - 0.07 K/uL  Comprehensive metabolic panel     Status: Abnormal   Collection Time: 04/16/20  7:31 PM  Result Value Ref Range   Sodium 140 135 - 145 mmol/L   Potassium 3.7 3.5 - 5.1 mmol/L   Chloride 105 98 - 111 mmol/L   CO2 23 22 - 32 mmol/L   Glucose, Bld 80 70 - 99 mg/dL   BUN 5 (L) 6 - 20 mg/dL   Creatinine, Ser 8.31 0.44 - 1.00 mg/dL   Calcium 8.7 (L) 8.9 - 10.3 mg/dL   Total Protein 6.6 6.5 - 8.1 g/dL   Albumin 2.9 (L) 3.5 - 5.0 g/dL   AST 31 15 - 41 U/L   ALT 32 0 - 44 U/L   Alkaline Phosphatase 82 38 - 126 U/L   Total Bilirubin 0.5 0.3 - 1.2 mg/dL   GFR calc non Af Amer >60 >60 mL/min   GFR calc Af Amer >60 >60 mL/min   Anion gap 12 5 - 15  Protein / creatinine ratio, urine     Status: None   Collection Time: 04/16/20  8:08 PM  Result Value Ref Range   Creatinine, Urine 201.59 mg/dL   Total Protein,  Urine 8 mg/dL   Protein Creatinine Ratio 0.04 0.00 - 0.15 mg/mg[Cre]  Urinalysis, Routine w reflex microscopic     Status: Abnormal   Collection Time: 04/16/20  8:08 PM  Result Value Ref Range   Color, Urine YELLOW YELLOW   APPearance CLEAR CLEAR   Specific Gravity, Urine 1.015 1.005 - 1.030   pH 6.0 5.0 - 8.0   Glucose, UA NEGATIVE NEGATIVE mg/dL   Hgb urine dipstick SMALL (A) NEGATIVE   Bilirubin Urine NEGATIVE NEGATIVE   Ketones, ur 5 (A) NEGATIVE mg/dL   Protein, ur NEGATIVE NEGATIVE mg/dL   Nitrite NEGATIVE NEGATIVE   Leukocytes,Ua NEGATIVE NEGATIVE   RBC / HPF 6-10 0 - 5 RBC/hpf   WBC, UA 0-5 0 - 5 WBC/hpf   Bacteria, UA NONE SEEN NONE SEEN   Squamous Epithelial / LPF 0-5 0 - 5   Mucus PRESENT    US Pelvis Complete  Result Date: 04/16/2020 CLINICAL DATA:  Initial evaluation for postpartum abdominal pain. EXAM: TRANSABDOMINAL ULTRASOUND OF PELVIS TECHNIQUE: Transabdominal ultrasound examination of the pelvis was performed including evaluation of the uterus, ovaries, adnexal regions, and pelvic cul-de-sac. COMPARISON:  None. FINDINGS: Uterus Measurements: 18.6 x 8.9 x 11.9 cm = volume: 1028 mL. No fibroids or other mass visualized. Endometrium Thickness: 32 mm. No focal abnormality visualized. No significant associated vascularity. Right  ovary Measurements: 2.7 x 1.4 x 2.6 cm = volume: 5.0 mL. Normal appearance/no adnexal mass. Left ovary Measurements: 4.1 x 1.2 x 2.5 cm = volume: 6.2 mL. Normal appearance/no adnexal mass. Other findings:  No abnormal free fluid. IMPRESSION: 1. Prominent endometrial thickening up to 32 mm without significant vascularity, most likely related to recent delivery. Retained products of conception not entirely excluded, and could be considered in the correct clinical setting. 2. Otherwise normal expected sonographic appearance of recently gravid uterus. 3. No other acute abnormality within the pelvis. Normal ovaries. No adnexal mass or free fluid. Electronically  Signed   By: Rise Mu M.D.   On: 04/16/2020 21:07    MDM CBC, pelvic U/S 800mg  Ibuprofen  Signed over to , CNM at [address] 04/16/2020, 7:26 PM   MDM: Pt with elevated BP x 2-3, some normal BPs.  Pt pain remains 6-7/10 following ibuprofen dose.  Percocet 5/325 x 2 tabs given and PEC labs initiated.  Report to 09-11-1989, CNM  Gerrit Heck, CNM 8:29 PM  Reassessment (9:12 PM) Vitals:   04/16/20 1919 04/16/20 1931 04/16/20 2002 04/16/20 2016  BP: (!) 143/91 (!) 140/92 (!) 134/91 132/89  Pulse: (!) 102 98 98 96  Resp: 20     Temp:      TempSrc:      SpO2:        -Labs return normal. -BP as above. -Dr. 06/17/20 consulted and informed of patient status and findings. Advised: *Not highly suspicious for endometritis, but start antibiotics.  Recommends Augmentin x 7 days  *Start Procardia 30mL XL daily. -Message sent to [facility] for one week bp check. -Provider to bedside to discuss results.  -Patient informed of need for follow up in office and medications as recommended. -Questions regarding UTI addressed and patient informed that urine sent for culture. -Will give dose of procardia prior to discharge. -Precautions given. -Encouraged to call or return to MAU if symptoms worsen or with the onset of new symptoms. -Discharged to home in stable condition.  31m MSN, CNM Advanced Practice Provider, Center for [facility]

## 2020-04-16 NOTE — MAU Note (Signed)
Vag del 6/30.feels like she is in labor all over again.  Having sharp pain and 'contractions' in her uterus.  Keeps going and coming.  Can't stand up straight. Barely bleeding.  Breast feeding going well.no problems with elimination.

## 2020-04-16 NOTE — Discharge Instructions (Signed)

## 2020-04-17 ENCOUNTER — Telehealth: Payer: Self-pay

## 2020-04-17 NOTE — Telephone Encounter (Signed)
Contacted pt after babyscripts called with elevated BP, no answer. Pt shecduled for BP check in office for 04-19-20. Pt was at MAU yesterday and received rx.

## 2020-04-18 ENCOUNTER — Telehealth: Payer: Self-pay

## 2020-04-18 LAB — CULTURE, OB URINE: Culture: NO GROWTH

## 2020-04-18 NOTE — Telephone Encounter (Signed)
Called pt after babyscripts alerted elevated BP of 139/92, pt stated that she does not have any abnormal symptoms and just started BP med, has follow up appt tomorrow.

## 2020-04-19 ENCOUNTER — Other Ambulatory Visit: Payer: Self-pay

## 2020-04-19 ENCOUNTER — Ambulatory Visit (HOSPITAL_BASED_OUTPATIENT_CLINIC_OR_DEPARTMENT_OTHER): Payer: Medicaid Other

## 2020-04-19 VITALS — BP 119/82 | HR 103 | Ht 59.0 in | Wt 221.0 lb

## 2020-04-19 DIAGNOSIS — O165 Unspecified maternal hypertension, complicating the puerperium: Secondary | ICD-10-CM

## 2020-04-19 NOTE — Progress Notes (Signed)
Subjective:  Katelyn Mcfarland is a 29 y.o. female here for BP check, she is 1 week PP.  C/o headaches 4/10 it was 10/10 3 days ago but it got better since she started the Procardia.  Hypertension ROS: taking medications as instructed, no medication side effects noted, no TIA's, no chest pain on exertion, no dyspnea on exertion and no swelling of ankles.    Objective:  BP 119/82   Pulse (!) 103   Ht 4\' 11"  (1.499 m)   Wt 221 lb (100.2 kg)   Breastfeeding Yes   BMI 44.64 kg/m   Appearance alert, well appearing, and in no distress. General exam BP noted to be well controlled today in office.    Assessment:   Blood Pressure well controlled.   Plan:  Current treatment plan is effective, no change in therapy. per Dr. . Keep PP appt. RTO/Hospital if symptoms gets worse or do not improve.

## 2020-04-25 NOTE — Progress Notes (Signed)
Patient ID: Katelyn Mcfarland, female   DOB: 1991/04/22, 29 y.o.   MRN: [id number] Patient was assessed and managed by nursing staff during this encounter. I have reviewed the chart and agree with the documentation and plan. I have also made any necessary editorial changes.  Scheryl Darter, MD 04/25/2020 6:13 PM

## 2020-04-30 ENCOUNTER — Ambulatory Visit: Payer: Medicaid Other | Admitting: Obstetrics and Gynecology

## 2020-05-08 ENCOUNTER — Ambulatory Visit: Payer: Medicaid Other | Admitting: Obstetrics and Gynecology

## 2020-05-09 ENCOUNTER — Telehealth (INDEPENDENT_AMBULATORY_CARE_PROVIDER_SITE_OTHER): Payer: Medicaid Other | Admitting: Obstetrics & Gynecology

## 2020-05-09 ENCOUNTER — Encounter: Payer: Self-pay | Admitting: Obstetrics & Gynecology

## 2020-05-09 VITALS — BP 130/75 | HR 105

## 2020-05-09 DIAGNOSIS — Z1389 Encounter for screening for other disorder: Secondary | ICD-10-CM

## 2020-05-09 DIAGNOSIS — Z9851 Tubal ligation status: Secondary | ICD-10-CM

## 2020-05-09 NOTE — Progress Notes (Signed)
TELEHEALTH POSTPARTUM VISIT ENCOUNTER NOTE  I connected with@ on 05/09/20 at  2:15 PM EDT by telephone at home and verified that I am speaking with the correct person using two identifiers.   I discussed the limitations, risks, security and privacy concerns of performing an evaluation and management service by telephone and the availability of in person appointments. I also discussed with the Katelyn Mcfarland that there may be a Katelyn Mcfarland responsible charge related to this service. The Katelyn Mcfarland expressed understanding and agreed to proceed.  Appointment Date: 05/09/2020  OBGYN Clinic: [facility]   Chief Complaint:  Postpartum Visit  History of Present Illness: Katelyn Mcfarland is a 29 y.o. African-American [id number] (No LMP recorded.), seen for the above chief complaint. Her past medical history is significant for    She is s/p normal spontaneous vaginal delivery on 04/11/20 at 39 weeks; she was discharged to home on 04/13/20. Pregnancy complicated by post BTL pregnancy. Baby is doing well.  Complains of ear pain.  Vaginal bleeding or discharge: spotting Mode of feeding infant: Breast Intercourse: No  Contraception: Wants BTL PP depression s/s: No .  Any bowel or bladder issues: No  Pap smear: no abnormalities (date: 05/13/19)  Review of Systems: Positive for none. Her 12 point review of systems is negative or as noted in the History of Present Illness.  Katelyn Mcfarland Active Problem List   Diagnosis Date Noted  . Indication for care in labor or delivery 04/11/2020  . Abdominal trauma 02/28/2020  . 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 Supervision of other normal pregnancy, antepartum 10/10/2019  . History of gestational hypertension 10/10/2019  . Obesity affecting pregnancy in second trimester 10/10/2019  . Failed Tubal ligation 08/28/2019  . Chronic vomiting 06/07/2019  . Depression 06/07/2019  . Nausea and vomiting  during pregnancy prior to [redacted] weeks gestation 05/01/2018  . Sickle cell trait (HCC) 05/01/2018  . BMI 35-39 05/01/2018  . GBS bacteriuria 04/28/2018  . Current smoker 01/11/2014    Medications Alley Neils. Twyman had no medications administered during this visit. Current Outpatient Medications  Medication Sig Dispense Refill  . ibuprofen (ADVIL) 600 MG tablet Take 1 tablet (600 mg total) by mouth every 6 (six) hours. 30 tablet 0  . acetaminophen (TYLENOL) 500 MG tablet Take 1,000 mg by mouth every 6 (six) hours as needed for mild pain. (Katelyn Mcfarland not taking: Reported on 05/09/2020)    . amoxicillin-clavulanate (AUGMENTIN) 875-125 MG tablet Take 1 tablet by mouth every 12 (twelve) hours. (Katelyn Mcfarland not taking: Reported on 05/09/2020) 14 tablet 0  . cyclobenzaprine (FLEXERIL) 5 MG tablet Take 5 mg by mouth 3 (three) times daily as needed for muscle spasms. (Katelyn Mcfarland not taking: Reported on 05/09/2020)    . NIFEdipine (PROCARDIA XL) 30 MG 24 hr tablet Take 1 tablet (30 mg total) by mouth daily. (Katelyn Mcfarland not taking: Reported on 05/09/2020) 30 tablet 1   No current facility-administered medications for this visit.    Allergies Katelyn Mcfarland has no known allergies.  Physical Exam:  General:  Alert, oriented and cooperative.   Mental Status: Normal mood and affect perceived. Normal judgment and thought content.  Rest of physical exam deferred due to type of encounter  PP Depression Screening:    Edinburgh Postnatal Depression Scale - 05/09/20 1430      Edinburgh Postnatal Depression Scale:  In the Past 7 Days   I have been able to laugh and see the funny side of things.  0    I have looked forward with enjoyment to things. 0    I have blamed myself unnecessarily when things went wrong. 0    I have been anxious or worried for no good reason. 0    I have felt scared or panicky for no good reason. 0    Things have been getting on top of me. 0    I have been so unhappy that I have had difficulty sleeping. 0     I have felt sad or miserable. 0    I have been so unhappy that I have been crying. 0    The thought of harming myself has occurred to me. 0    Edinburgh Postnatal Depression Scale Total 0           Assessment:Katelyn Mcfarland is a 29 y.o. [id number] who is 4 weeks postpartum from a normal spontaneous vaginal delivery.  She is doing well.   Plan:  1. Failed Tubal ligation Schedule, consider laparoscopy - DG Hysterogram (HSG); Future - [facility]   RTC after the imaging is done  I discussed the assessment and treatment plan with the Katelyn Mcfarland. The Katelyn Mcfarland was provided an opportunity to ask questions and all were answered. The Katelyn Mcfarland agreed with the plan and demonstrated an understanding of the instructions.   The Katelyn Mcfarland was advised to call back or seek an in-person evaluation/go to the ED for any concerning postpartum symptoms.  I provided 12 minutes of non-face-to-face time during this encounter.   Center for [employer], [facility]

## 2020-05-09 NOTE — Patient Instructions (Signed)
Hysterosalpingography  Hysterosalpingography is a procedure in which a woman's uterus and fallopian tubes are examined. During this procedure, contrast dye is injected into the uterus through the vagina and cervix. X-rays are then taken. The dye makes the uterus and fallopian tubes show up clearly on the X-rays. This procedure may be done:  To help determine whether there are tumors, scars (adhesions), or other abnormalities in the uterus.  To find out why a woman is unable to have children (infertile).  To make sure the fallopian tubes are completely blocked a few months after having certain tubal sterilization procedures. Tell a health care provider about:  Any allergies you have.  All medicines you are taking, including vitamins, herbs, eye drops, creams, and over-the-counter medicines.  Any problems you or family members have had with the use of anesthetic medicines.  Any blood disorders you have.  Any surgeries you have had.  Any medical conditions you have.  Whether you are pregnant or may be pregnant. What are the risks? Generally, this is a safe procedure. However, problems may occur, including:  Infection in the lining of the uterus (endometritis) or fallopian tubes (salpingitis).  Allergic reaction to medicines or dyes.  Risk of making a hole (perforation) in the uterus or fallopian tubes.  Damage to other structures or organs. What happens before the procedure?  Schedule the procedure after your menstrual period stops, but before your next ovulation. This is usually between day 5 and day 10 of your last period. Day 1 is the first day of your period.  Ask your health care provider about changing or stopping your regular medicines. This is especially important if you are taking diabetes medicines or blood thinners.  Empty your bladder before the procedure begins.  Plan to have someone take you home from the hospital or clinic. What happens during the  procedure?  You may be given one of the following: ? A medicine to help you relax (sedative). ? An over-the-counter pain medicine.  You will lie down on your back and place your feet into footrests (stirrups).  A device called a speculum will be inserted into your vagina. This allows your health care provider to see inside your vagina through to your cervix.  Your cervix will be washed with a germ-killing soap.  A medicine may be injected into your cervix to numb it (local anesthesia).  A thin, flexible tube will be passed through your cervix into your uterus.  Contrast dye will be passed through the tube and into the uterus. Contrast dye may cause some cramping.  Several X-rays will be taken as the contrast dye spreads through the uterus and into the fallopian tubes.  The tube will be removed. The contrast dye will flow out through your vagina naturally. The procedure may vary among health care providers and hospitals. What happens after the procedure?  Most of the contrast dye will flow out of your vagina naturally. You may want to wear a sanitary pad.  You may have mild cramping and vaginal bleeding. This should go away after a short time.  Do not drive for 24 hours if you were given a sedative.  It is up to you to get the results of your procedure. Ask your health care provider, or the department that is doing the procedure, when your results will be ready. Summary  Hysterosalpingography is a procedure in which a woman's uterus and fallopian tubes are examined.  During this procedure, contrast dye is injected into the uterus  through the vagina and cervix. X-rays are then taken. The dye helps the uterus and fallopian tubes show up clearly on the X-rays.  Schedule the procedure after your menstrual period stops, but before your next ovulation. This is usually between day 5 and day 10 of your last period.  After the procedure, you may have mild cramping and vaginal bleeding.  This should go away after a short time. This information is not intended to replace advice given to you by your health care provider. Make sure you discuss any questions you have with your health care provider. Document Revised: 09/11/2017 Document Reviewed: 10/22/2016 Elsevier Patient Education  2020 Elsevier Inc.  

## 2020-05-15 ENCOUNTER — Telehealth: Payer: Self-pay

## 2020-05-15 ENCOUNTER — Other Ambulatory Visit: Payer: Self-pay | Admitting: Obstetrics & Gynecology

## 2020-05-15 DIAGNOSIS — Z9851 Tubal ligation status: Secondary | ICD-10-CM

## 2020-05-15 NOTE — Telephone Encounter (Signed)
Received message from Maralyn Sago at [facility] was informed that the pt has an HSG scheduled that Dr. Debroah Loop ordered.  However due to the patient having irregular periods pt will need to have urine pregnancy test and told not to have sex until after the HSG is performed.  Message routed to [facility] due to patient belonging to the office.

## 2020-05-16 ENCOUNTER — Telehealth: Payer: Self-pay | Admitting: Pharmacist

## 2020-05-16 NOTE — Telephone Encounter (Signed)
Called patient to follow-up on tobacco cessation.   Patient reports doing well. She had her 4th child 04/11/2020 and has made it through a big trigger of her grandmother's death in 02-11-2023 without smoking.  She reports abstaining from alcohol due to her past of associating drinking with smoking.  She is an excellent candidate for long-term cessation.   At this time we agreed that no additional follow-up was needed.  I am willing to see her again as needed.    I encouraged her to follow-up with her PCP, Dr. Rachael Darby when needed.

## 2020-05-17 NOTE — Telephone Encounter (Signed)
Noted and agreed. Thank you Dr Koval. 

## 2020-05-18 ENCOUNTER — Telehealth: Payer: Self-pay

## 2020-05-18 NOTE — Telephone Encounter (Signed)
TC to [facility] for clarity on message regarding need for  UPT. [name] at [facility] stating due to pt irregular periods pt will need UPT before HSG can be scheduled Pt cannot have any intercourse until after procedure Pt confirmed she has not had any intercourse and made aware not to. Pt made aware she will be receiving call for Nurse visit for UPT and will then get call for procedure onced confirmed negative.  Pt voiced understanding.

## 2020-05-22 ENCOUNTER — Ambulatory Visit (INDEPENDENT_AMBULATORY_CARE_PROVIDER_SITE_OTHER): Payer: Medicaid Other

## 2020-05-22 ENCOUNTER — Other Ambulatory Visit: Payer: Self-pay

## 2020-05-22 DIAGNOSIS — N926 Irregular menstruation, unspecified: Secondary | ICD-10-CM | POA: Diagnosis not present

## 2020-05-22 LAB — POCT URINE PREGNANCY: Preg Test, Ur: NEGATIVE

## 2020-05-22 NOTE — Progress Notes (Signed)
Pt here for UPT.  Pt has to have UPT due to irregular periods before HSG can be scheduled and no intercourse.

## 2020-05-23 ENCOUNTER — Telehealth: Payer: Self-pay

## 2020-05-23 NOTE — Telephone Encounter (Signed)
TC to [facility] the pt had Negative UPT and confirmed no intercourse and not to have intercourse until procedure is done.  Maralyn Sago was not ava left detailed message if she needed any assistance from [name] here at [facility] .

## 2020-05-31 ENCOUNTER — Ambulatory Visit
Admission: RE | Admit: 2020-05-31 | Discharge: 2020-05-31 | Disposition: A | Discharge status: Home / Self Care | Payer: Medicaid Other | Source: Ambulatory Visit | Attending: Obstetrics & Gynecology | Admitting: Obstetrics & Gynecology

## 2020-05-31 DIAGNOSIS — Z9851 Tubal ligation status: Secondary | ICD-10-CM

## 2020-06-07 ENCOUNTER — Other Ambulatory Visit: Payer: Self-pay

## 2020-06-07 DIAGNOSIS — M5432 Sciatica, left side: Secondary | ICD-10-CM

## 2020-06-12 ENCOUNTER — Ambulatory Visit: Payer: Medicaid Other

## 2020-06-19 ENCOUNTER — Encounter: Payer: Medicaid Other | Admitting: Family Medicine

## 2020-06-19 ENCOUNTER — Other Ambulatory Visit: Payer: Self-pay

## 2020-06-19 ENCOUNTER — Ambulatory Visit: Payer: Medicaid Other | Attending: Obstetrics and Gynecology

## 2020-06-19 DIAGNOSIS — M6281 Muscle weakness (generalized): Secondary | ICD-10-CM | POA: Diagnosis not present

## 2020-06-19 DIAGNOSIS — M5442 Lumbago with sciatica, left side: Secondary | ICD-10-CM | POA: Diagnosis present

## 2020-06-19 DIAGNOSIS — G8929 Other chronic pain: Secondary | ICD-10-CM | POA: Diagnosis present
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
## 2020-06-20 ENCOUNTER — Ambulatory Visit (INDEPENDENT_AMBULATORY_CARE_PROVIDER_SITE_OTHER): Payer: Medicaid Other | Admitting: Family Medicine

## 2020-06-20 ENCOUNTER — Other Ambulatory Visit: Payer: Self-pay

## 2020-06-20 VITALS — BP 116/82 | HR 68 | Ht 59.0 in | Wt 219.4 lb

## 2020-06-20 DIAGNOSIS — Z111 Encounter for screening for respiratory tuberculosis: Secondary | ICD-10-CM | POA: Diagnosis present

## 2020-06-20 DIAGNOSIS — Z23 Encounter for immunization: Secondary | ICD-10-CM | POA: Diagnosis not present

## 2020-06-20 DIAGNOSIS — Z0000 Encounter for general adult medical examination without abnormal findings: Secondary | ICD-10-CM | POA: Diagnosis not present

## 2020-06-20 NOTE — Patient Instructions (Signed)
It was a pleasure to meet you today!  You appear to be doing well and I have no concerns at this time.  You are up-to-date on all of your routine screening.  Congratulations on receiving the first dose of the COVID-19 vaccine.  You will get a second dose in 21 days.  You may notice some muscle aches, fatigue, fever for the first 24-48 hours after that first dose in the second dose of the vaccine but that will resolve on its own.  If you have any questions or concerns please feel free to call the clinic.  I hope you have a wonderful afternoon!   Preventive Care 47-87 Years Old, Female Preventive care refers to visits with your health care provider and lifestyle choices that can promote health and wellness. This includes:  A yearly physical exam. This may also be called an annual well check.  Regular dental visits and eye exams.  Immunizations.  Screening for certain conditions.  Healthy lifestyle choices, such as eating a healthy diet, getting regular exercise, not using drugs or products that contain nicotine and tobacco, and limiting alcohol use. What can I expect for my preventive care visit? Physical exam Your health care provider will check your:  Height and weight. This may be used to calculate body mass index (BMI), which tells if you are at a healthy weight.  Heart rate and blood pressure.  Skin for abnormal spots. Counseling Your health care provider may ask you questions about your:  Alcohol, tobacco, and drug use.  Emotional well-being.  Home and relationship well-being.  Sexual activity.  Eating habits.  Work and work Statistician.  Method of birth control.  Menstrual cycle.  Pregnancy history. What immunizations do I need?  Influenza (flu) vaccine  This is recommended every year. Tetanus, diphtheria, and pertussis (Tdap) vaccine  You may need a Td booster every 10 years. Varicella (chickenpox) vaccine  You may need this if you have not been vaccinated.  Human papillomavirus (HPV) vaccine  If recommended by your health care provider, you may need three doses over 6 months. Measles, mumps, and rubella (MMR) vaccine  You may need at least one dose of MMR. You may also need a second dose. Meningococcal conjugate (MenACWY) vaccine  One dose is recommended if you are age 21-21 years and a first-year college student living in a residence hall, or if you have one of several medical conditions. You may also need additional booster doses. Pneumococcal conjugate (PCV13) vaccine  You may need this if you have certain conditions and were not previously vaccinated. Pneumococcal polysaccharide (PPSV23) vaccine  You may need one or two doses if you smoke cigarettes or if you have certain conditions. Hepatitis A vaccine  You may need this if you have certain conditions or if you travel or work in places where you may be exposed to hepatitis A. Hepatitis B vaccine  You may need this if you have certain conditions or if you travel or work in places where you may be exposed to hepatitis B. Haemophilus influenzae type b (Hib) vaccine  You may need this if you have certain conditions. You may receive vaccines as individual doses or as more than one vaccine together in one shot (combination vaccines). Talk with your health care provider about the risks and benefits of combination vaccines. What tests do I need?  Blood tests  Lipid and cholesterol levels. These may be checked every 5 years starting at age 45.  Hepatitis C test.  Hepatitis  B test. Screening  Diabetes screening. This is done by checking your blood sugar (glucose) after you have not eaten for a while (fasting).  Sexually transmitted disease (STD) testing.  BRCA-related cancer screening. This may be done if you have a family history of breast, ovarian, tubal, or peritoneal cancers.  Pelvic exam and Pap test. This may be done every 3 years starting at age 56. Starting at age 18, this  may be done every 5 years if you have a Pap test in combination with an HPV test. Talk with your health care provider about your test results, treatment options, and if necessary, the need for more tests. Follow these instructions at home: Eating and drinking   Eat a diet that includes fresh fruits and vegetables, whole grains, lean protein, and low-fat dairy.  Take vitamin and mineral supplements as recommended by your health care provider.  Do not drink alcohol if: ? Your health care provider tells you not to drink. ? You are pregnant, may be pregnant, or are planning to become pregnant.  If you drink alcohol: ? Limit how much you have to 0-1 drink a day. ? Be aware of how much alcohol is in your drink. In the [place] one drink equals one 12 oz bottle of beer (355 mL), one 5 oz glass of wine (148 mL), or one 1 oz glass of hard liquor (44 mL). Lifestyle  Take daily care of your teeth and gums.  Stay active. Exercise for at least 30 minutes on 5 or more days each week.  Do not use any products that contain nicotine or tobacco, such as cigarettes, e-cigarettes, and chewing tobacco. If you need help quitting, ask your health care provider.  If you are sexually active, practice safe sex. Use a condom or other form of birth control (contraception) in order to prevent pregnancy and STIs (sexually transmitted infections). If you plan to become pregnant, see your health care provider for a preconception visit. What's next?  Visit your health care provider once a year for a well check visit.  Ask your health care provider how often you should have your eyes and teeth checked.  Stay up to date on all vaccines. This information is not intended to replace advice given to you by your health care provider. Make sure you discuss any questions you have with your health care provider. Document Revised: 06/10/2018 Document Reviewed: 06/10/2018 Elsevier Patient Education  2020 [employer].

## 2020-06-20 NOTE — Progress Notes (Signed)
PPD placed @ 10:53 am on left forearm. Wheal present. Pt coming back on 06/22/2020 @2 :30pm to be read.Katelyn Mcfarland, CMA

## 2020-06-20 NOTE — Progress Notes (Signed)
SUBJECTIVE:   Chief compliant/HPI: annual examination  Katelyn Mcfarland is a 29 y.o. who presents today for an annual exam.   Review of Systems  Constitutional: Negative for chills, fever and weight loss.  HENT: Negative for congestion and sore throat.   Eyes: Negative for blurred vision.  Respiratory: Negative for cough and shortness of breath.   Cardiovascular: Negative for chest pain and leg swelling.  Gastrointestinal: Negative for abdominal pain, constipation, diarrhea, nausea and vomiting.  Genitourinary: Negative for dysuria.  Musculoskeletal: Positive for back pain.  Skin: Negative for rash.  Neurological: Positive for headaches. Negative for dizziness and weakness.  Psychiatric/Behavioral: Negative for depression.    OBJECTIVE:   BP 116/82   Pulse 68   Ht 4\' 11"  (1.499 m)   Wt 219 lb 6.4 oz (99.5 kg)   SpO2 98%   BMI 44.31 kg/m   Physical Exam Vitals and nursing note reviewed.  Constitutional:      General: She is not in acute distress.    Appearance: Normal appearance. She is not ill-appearing.  HENT:     Head: Normocephalic and atraumatic.     Right Ear: Tympanic membrane normal.     Left Ear: Tympanic membrane normal.     Nose: Nose normal. No congestion or rhinorrhea.     Mouth/Throat:     Mouth: Mucous membranes are moist.  Eyes:     Extraocular Movements: Extraocular movements intact.     Conjunctiva/sclera: Conjunctivae normal.     Pupils: Pupils are equal, round, and reactive to light.  Cardiovascular:     Rate and Rhythm: Normal rate and regular rhythm.     Pulses: Normal pulses.     Heart sounds: Normal heart sounds.  Pulmonary:     Effort: Pulmonary effort is normal.     Breath sounds: Normal breath sounds.  Abdominal:     General: Bowel sounds are normal.     Palpations: Abdomen is soft.     Tenderness: There is no abdominal tenderness.  Musculoskeletal:        General: Tenderness (in left lower back) present. Normal range of motion.      Cervical back: Normal range of motion and neck supple. No rigidity or tenderness.  Skin:    General: Skin is warm and dry.     Capillary Refill: Capillary refill takes less than 2 seconds.     Findings: No rash.  Neurological:     General: No focal deficit present.     Mental Status: She is alert.  Psychiatric:        Mood and Affect: Mood normal.        Behavior: Behavior normal.      ASSESSMENT/PLAN:   No problem-specific Assessment & Plan notes found for this encounter.    Annual Examination  See AVS for age appropriate recommendations.   PHQ score 6, reviewed and discussed. Blood pressure reviewed and at goal .  The patient currently not using anything for contraception but reports she is abstinent.  Reports that she has tried other birth controls in the past and has not liked them.  Discussed the importance of birth control or prenatal vitamins if she is on birth control.   Patient requested TB test today for work   Cervical cancer screening: prior Pap reviewed, repeat due in 05/12/2022 Immunizations up to date.  Patient received first dose of Pfizer COVID-19 vaccine today and will receive the second dose in 21 days.  Patient declined flu  vaccine at this time but will get it at a later date.  Follow up in 1  year or sooner if indicated.    Derrel Nip, MD [facility]

## 2020-06-22 ENCOUNTER — Other Ambulatory Visit: Payer: Self-pay

## 2020-06-22 ENCOUNTER — Ambulatory Visit (INDEPENDENT_AMBULATORY_CARE_PROVIDER_SITE_OTHER): Payer: Medicaid Other

## 2020-06-22 DIAGNOSIS — Z111 Encounter for screening for respiratory tuberculosis: Secondary | ICD-10-CM

## 2020-06-22 LAB — TB SKIN TEST
Induration: 0 mm
TB Skin Test: NEGATIVE

## 2020-06-22 NOTE — Progress Notes (Signed)
Patient is here for a PPD read.  It was placed on 06/20/2020 in the left forearm @ 1053 am.    PPD RESULTS:  Result: negative Induration: 0 mm  Letter created and given to patient for documentation purposes. Veronda Prude, RN

## 2020-06-27 ENCOUNTER — Ambulatory Visit: Payer: Medicaid Other | Admitting: Physical Therapy

## 2020-06-29 ENCOUNTER — Telehealth: Payer: Self-pay | Admitting: Physical Therapy

## 2020-06-29 NOTE — Telephone Encounter (Signed)
Putting together exercises since being pregnant. Now I am still in pain, can barely hold car seat. I start work on Monday 8:30-5:30. Needs late appointments- would like to schedule today  Katelyn Mcfarland C. [name] PT, DPT 06/29/20 11:32 AM

## 2020-07-02 ENCOUNTER — Ambulatory Visit: Payer: Medicaid Other | Admitting: Physical Therapy

## 2020-07-05 ENCOUNTER — Telehealth: Payer: Self-pay

## 2020-07-05 ENCOUNTER — Ambulatory Visit: Payer: Medicaid Other

## 2020-07-05 NOTE — Telephone Encounter (Signed)
Pt called asking about results from Hysterogram back in August. She claims that no one ever told her the results or next steps. Please advise.

## 2020-07-11 ENCOUNTER — Other Ambulatory Visit: Payer: Self-pay

## 2020-07-11 ENCOUNTER — Ambulatory Visit (HOSPITAL_COMMUNITY)
Admission: EM | Admit: 2020-07-11 | Discharge: 2020-07-11 | Disposition: A | Discharge status: Home / Self Care | Payer: Medicaid Other | Attending: Emergency Medicine | Admitting: Emergency Medicine

## 2020-07-11 ENCOUNTER — Encounter (HOSPITAL_COMMUNITY): Payer: Self-pay | Admitting: Emergency Medicine

## 2020-07-11 ENCOUNTER — Ambulatory Visit (INDEPENDENT_AMBULATORY_CARE_PROVIDER_SITE_OTHER): Payer: Medicaid Other

## 2020-07-11 DIAGNOSIS — Z3202 Encounter for pregnancy test, result negative: Secondary | ICD-10-CM

## 2020-07-11 DIAGNOSIS — R350 Frequency of micturition: Secondary | ICD-10-CM | POA: Insufficient documentation

## 2020-07-11 DIAGNOSIS — R112 Nausea with vomiting, unspecified: Secondary | ICD-10-CM | POA: Diagnosis present

## 2020-07-11 DIAGNOSIS — N898 Other specified noninflammatory disorders of vagina: Secondary | ICD-10-CM | POA: Insufficient documentation

## 2020-07-11 DIAGNOSIS — Z23 Encounter for immunization: Secondary | ICD-10-CM | POA: Diagnosis not present

## 2020-07-11 DIAGNOSIS — R110 Nausea: Secondary | ICD-10-CM | POA: Diagnosis not present

## 2020-07-11 LAB — POCT URINALYSIS DIPSTICK, ED / UC
Bilirubin Urine: NEGATIVE
Glucose, UA: NEGATIVE mg/dL
Hgb urine dipstick: NEGATIVE
Ketones, ur: NEGATIVE mg/dL
Leukocytes,Ua: NEGATIVE
Nitrite: NEGATIVE
Protein, ur: NEGATIVE mg/dL
Specific Gravity, Urine: 1.02 (ref 1.005–1.030)
Urobilinogen, UA: 0.2 mg/dL (ref 0.0–1.0)
pH: 6 (ref 5.0–8.0)

## 2020-07-11 LAB — CBG MONITORING, ED: Glucose-Capillary: 113 mg/dL — ABNORMAL HIGH (ref 70–99)

## 2020-07-11 LAB — POC URINE PREG, ED: Preg Test, Ur: NEGATIVE

## 2020-07-11 MED ORDER — ONDANSETRON 4 MG PO TBDP: 4.0000 mg | ORAL_TABLET | Freq: Three times a day (TID) | ORAL | 0 refills | Status: DC | PRN

## 2020-07-11 NOTE — ED Provider Notes (Signed)
[facility]    CSN: [id number] Arrival date & time: 07/11/20  1143      History   Chief Complaint Chief Complaint  Patient presents with  . Urinary Frequency  . Nausea    HPI Katelyn Mcfarland is a 29 y.o. female.   Katelyn Mcfarland presents with complaints of urinary frequency. Nausea. Vomited last night. Also has noted vaginal odor. Some pelvic discomfort, worse after eamptying bladder. Started 3 weeks ago. Worsening. Normal BM's, a few times a day. No fevers. No vaginal discharge. Not sexually active. History of UTI's which have felt similar. History of BV as well. Has been drinking water and cranberry juice which hasn't helped. No polydipsia.    ROS per HPI, negative if not otherwise mentioned.      Past Medical History:  Diagnosis Date  . Allergy   . Chlamydia    age 67yo; hx/o trichomonas age 6yo  . Chronic headache   . Depression    doing ok, declined meds  . High cholesterol   . Infection    UTI  . Pregnancy induced hypertension   . Trichomonas vaginalis infection   . Vaginal Pap smear, abnormal    bx, cryo    Patient Active Problem List   Diagnosis Date Noted  . PPD screening test 06/20/2020  . Annual physical exam 06/20/2020  . 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 History of gestational hypertension 10/10/2019  . Failed Tubal ligation 08/28/2019  . Chronic vomiting 06/07/2019  . Depression 06/07/2019  . Sickle cell trait (HCC) 05/01/2018  . BMI 35-39 05/01/2018  . Current smoker 01/11/2014    Past Surgical History:  Procedure Laterality Date  . INDUCED ABORTION    . TUBAL LIGATION Bilateral 11/15/2018   Procedure: POST PARTUM TUBAL LIGATION;  Surgeon: Hermina Staggers, MD;  Location: [facility];  Service: Gynecology;  Laterality: Bilateral;  . WISDOM TOOTH EXTRACTION      OB History    Gravida  7   Para  4   Term  4   Preterm  0   AB  3    Living  4     SAB  2   TAB  1   Ectopic  0   Multiple  0   Live Births  4            Home Medications    Prior to Admission medications   Medication Sig Start Date End Date Taking? Authorizing Provider  acetaminophen (TYLENOL) 500 MG tablet Take 1,000 mg by mouth every 6 (six) hours as needed for mild pain.     [provider]  amoxicillin-clavulanate (AUGMENTIN) 875-125 MG tablet Take 1 tablet by mouth every 12 (twelve) hours. 04/16/20   Gerrit Heck, CNM  cyclobenzaprine (FLEXERIL) 5 MG tablet Take 5 mg by mouth 3 (three) times daily as needed for muscle spasms.     [provider]  ibuprofen (ADVIL) 600 MG tablet Take 1 tablet (600 mg total) by mouth every 6 (six) hours. 04/13/20   Cresenzo-Dishmon, Scarlette Calico, CNM  NIFEdipine (PROCARDIA XL) 30 MG 24 hr tablet Take 1 tablet (30 mg total) by mouth daily. Patient not taking: Reported on 05/09/2020 04/16/20   Gerrit Heck, CNM  ondansetron (ZOFRAN-ODT) 4 MG disintegrating tablet Take 1 tablet (4 mg total) by mouth every 8 (eight) hours as needed for nausea or vomiting. 07/11/20   Georgetta Haber, NP  Family History Family History  Problem Relation Age of Onset  . Hypertension Mother   . Diabetes Mother   . Asthma Sister   . Cancer Paternal Grandmother   . Cancer Paternal Grandfather   . Cancer Maternal Grandmother   . Kidney disease Maternal Grandmother   . Diabetes Maternal Grandmother   . Heart disease Neg Hx   . Stroke Neg Hx     Social History Social History   Tobacco Use  . Smoking status: Former Smoker    Packs/day: 1.00    Years: 16.00    Pack years: 16.00    Types: Cigarettes    Start date: 10/13/2000  . Smokeless tobacco: Never Used  . Tobacco comment: stopped August 2020  Vaping Use  . Vaping Use: Never used  Substance Use Topics  . Alcohol use: Not Currently    Comment: not currently, socially  . Drug use: Not Currently    Types: Marijuana    Comment: last was october 2020      Allergies   Patient has no known allergies.   Review of Systems Review of Systems   Physical Exam Triage Vital Signs ED Triage Vitals  Enc Vitals Group     BP 07/11/20 1340 114/63     Pulse Rate 07/11/20 1340 90     Resp 07/11/20 1340 18     Temp 07/11/20 1340 98.9 F (37.2 C)     Temp Source 07/11/20 1340 Oral     SpO2 07/11/20 1340 99 %     Weight --      Height --      Head Circumference --      Peak Flow --      Pain Score 07/11/20 1339 6     Pain Loc --      Pain Edu? --      Excl. in GC? --    No data found.  Updated Vital Signs BP 114/63 (BP Location: Left Arm)   Pulse 90   Temp 98.9 F (37.2 C) (Oral)   Resp 18   SpO2 99%   Visual Acuity Right Eye Distance:   Left Eye Distance:   Bilateral Distance:    Right Eye Near:   Left Eye Near:    Bilateral Near:     Physical Exam Constitutional:      General: She is not in acute distress.    Appearance: She is well-developed.  Cardiovascular:     Rate and Rhythm: Normal rate.  Pulmonary:     Effort: Pulmonary effort is normal.  Abdominal:     Palpations: Abdomen is not rigid.     Tenderness: There is no abdominal tenderness. There is no guarding or rebound.  Genitourinary:    Comments: Denies sores, lesions, vaginal bleeding; no pelvic pain; gu exam deferred at this time, vaginal self swab collected.   Skin:    General: Skin is warm and dry.  Neurological:     Mental Status: She is alert and oriented to person, place, and time.      UC Treatments / Results  Labs (all labs ordered are listed, but only abnormal results are displayed) Labs Reviewed  URINE CULTURE  POCT URINALYSIS DIPSTICK, ED / UC  POC URINE PREG, ED  CBG MONITORING, ED  CERVICOVAGINAL ANCILLARY ONLY    EKG   Radiology No results found.  Procedures Procedures (including critical care time)  Medications Ordered in UC Medications - No data to display  Initial Impression / Assessment  and Plan / UC Course  I  have reviewed the triage vital signs and the nursing notes.  Pertinent labs & imaging results that were available during my care of the patient were reviewed by me and considered in my medical decision making (see chart for details).     UA without acute findings here today. Vaginal cytology pending. Blood sugar in clinic is normal as well. Pelvic floor dysfunction contributing? Patient in rush to depart so limited conversation at time of discharge, encouraged follow up with symptoms persist. Patient verbalized understanding and agreeable to plan.  Ambulatory out of clinic without difficulty.    Final Clinical Impressions(s) / UC Diagnoses   Final diagnoses:  Urinary frequency  Non-intractable vomiting with nausea, unspecified vomiting type  Vaginal odor     Discharge Instructions     Your urine is normal today, no indication of UTI. I have sent it to be cultured to confirm.  We will test your vagina as well to see if there are any sources of infection with this. We will notify of you any positive findings or if any changes to treatment are needed. If normal or otherwise without concern to your results, we will not call you. Please log on to your MyChart to review your results if interested in so.   Zofran every 8 hours as needed for nausea or vomiting.   If symptoms worsen or do not improve in the next week to return to be seen or to follow up with your PCP.      ED Prescriptions    Medication Sig Dispense Auth. Provider   ondansetron (ZOFRAN-ODT) 4 MG disintegrating tablet Take 1 tablet (4 mg total) by mouth every 8 (eight) hours as needed for nausea or vomiting. 12 tablet Georgetta Haber, NP     PDMP not reviewed this encounter.   Georgetta Haber, NP 07/11/20 1414

## 2020-07-11 NOTE — ED Triage Notes (Signed)
Pt presents with urinary frequency, "bladder Pain", lower abdominal pain xs 2-3 weeks.

## 2020-07-11 NOTE — Discharge Instructions (Signed)
Your urine is normal today, no indication of UTI. I have sent it to be cultured to confirm.  We will test your vagina as well to see if there are any sources of infection with this. We will notify of you any positive findings or if any changes to treatment are needed. If normal or otherwise without concern to your results, we will not call you. Please log on to your MyChart to review your results if interested in so.   Zofran every 8 hours as needed for nausea or vomiting.   If symptoms worsen or do not improve in the next week to return to be seen or to follow up with your PCP.

## 2020-07-11 NOTE — ED Notes (Signed)
113-glucose reading

## 2020-07-11 NOTE — Progress Notes (Signed)
   Covid-19 Vaccination Clinic  Name:  EASTER KENNEBREW    MRN: [id number] DOB: 1991-03-02  07/11/2020  Ms. Brisbin was observed post Covid-19 immunization for 15 minutes without incident. She was provided with Vaccine Information Sheet and instruction to access the V-Safe system.   Ms. Karp was instructed to call 911 with any severe reactions post vaccine: Marland Kitchen Difficulty breathing  . Swelling of face and throat  . A fast heartbeat  . A bad rash all over body  . Dizziness and weakness   #2 Covid Vaccine administered LD without complication.

## 2020-07-12 LAB — CERVICOVAGINAL ANCILLARY ONLY
Bacterial Vaginitis (gardnerella): POSITIVE — AB
Candida Glabrata: NEGATIVE
Candida Vaginitis: NEGATIVE
Chlamydia: NEGATIVE
Comment: NEGATIVE
Comment: NEGATIVE
Comment: NEGATIVE
Comment: NEGATIVE
Comment: NEGATIVE
Comment: NORMAL
Neisseria Gonorrhea: NEGATIVE
Trichomonas: NEGATIVE

## 2020-07-12 LAB — URINE CULTURE

## 2020-07-13 ENCOUNTER — Telehealth (HOSPITAL_COMMUNITY): Payer: Self-pay | Admitting: Emergency Medicine

## 2020-07-13 MED ORDER — METRONIDAZOLE 500 MG PO TABS: 500.0000 mg | ORAL_TABLET | Freq: Two times a day (BID) | ORAL | 0 refills | Status: DC

## 2020-07-25 ENCOUNTER — Ambulatory Visit: Payer: Medicaid Other

## 2020-07-26 ENCOUNTER — Other Ambulatory Visit: Payer: Self-pay

## 2020-07-26 ENCOUNTER — Ambulatory Visit: Payer: Medicaid Other | Attending: Obstetrics & Gynecology

## 2020-07-26 DIAGNOSIS — M6281 Muscle weakness (generalized): Secondary | ICD-10-CM | POA: Insufficient documentation

## 2020-07-26 DIAGNOSIS — M542 Cervicalgia: Secondary | ICD-10-CM | POA: Insufficient documentation

## 2020-07-26 DIAGNOSIS — M5442 Lumbago with sciatica, left side: Secondary | ICD-10-CM | POA: Diagnosis present

## 2020-07-26 DIAGNOSIS — G8929 Other chronic pain: Secondary | ICD-10-CM | POA: Insufficient documentation

## 2020-07-26 DIAGNOSIS — R262 Difficulty in walking, not elsewhere classified: Secondary | ICD-10-CM | POA: Diagnosis present

## 2020-07-26 NOTE — Therapy (Signed)
[facility] [address] [place], [place], [address] Phone: [phone]   Fax:  [phone]  Physical Therapy Treatment  Patient Details  Name: Katelyn Mcfarland MRN: [id number] Date of Birth: 1991-09-07 Referring Provider (PT): Marylou Flesher, MD   Encounter Date: 07/26/2020   PT End of Session - 07/26/20 1942    Visit Number 2    Number of Visits 12    Date for PT Re-Evaluation 08/17/20    Authorization Type MCD    PT Start Time 1833    PT Stop Time 1920    PT Time Calculation (min) 47 min    Activity Tolerance Patient limited by pain    Behavior During Therapy [facility] for tasks assessed/performed           Past Medical History:  Diagnosis Date  . Allergy   . Chlamydia    age 21yo; hx/o trichomonas age 42yo  . Chronic headache   . Depression    doing ok, declined meds  . High cholesterol   . Infection    UTI  . Pregnancy induced hypertension   . Trichomonas vaginalis infection   . Vaginal Pap smear, abnormal    bx, cryo    Past Surgical History:  Procedure Laterality Date  . INDUCED ABORTION    . TUBAL LIGATION Bilateral 11/15/2018   Procedure: POST PARTUM TUBAL LIGATION;  Surgeon: Hermina Staggers, MD;  Location: [facility];  Service: Gynecology;  Laterality: Bilateral;  . WISDOM TOOTH EXTRACTION      There were no vitals filed for this visit.   Subjective Assessment - 07/26/20 1936    Subjective Pt reports she is not aving low back pain today. Pt states she has pain levels from 0-10. Pt reports the other day after going to the grocery store her low back pain was a 10/10 and hse needed to lie down and rest and take tylenol. 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 Pt remarks to take care of her kids she works through the pain.Pt states she stopped the initial exs because they bothered her neck and shoulder. Pt would like PT for her neck and shoulder as well.    Patient  Stated Goals to have less pain    Currently in Pain? No/denies    Pain Score 10-Worst pain ever    Pain Location Back    Pain Orientation Lower    Pain Descriptors / Indicators Aching;Throbbing;Sharp    Pain Type Chronic pain    Pain Onset More than a month ago    Pain Frequency Intermittent                             [facility] Adult PT Treatment/Exercise - 07/26/20 0001      Exercises   Exercises Lumbar      Lumbar Exercises: Aerobic   Nustep L2; 5 mins; UEs/LEs      Lumbar Exercises: Standing   Wall Slides 10 reps    Wall Slides Limitations 1/4 slide      Lumbar Exercises: Seated   Long Arc Quad on Chair AROM;5 reps    LAQ on Chair Limitations 20 sec stretch    Other Seated Lumbar Exercises Forward flexion to low back c minimal use of arms for assist 5x, 10 sec      Lumbar Exercises: Supine   Other Supine Lumbar Exercises Abd  mini-crunch c hands reacing to ankles 10x                  PT Education - 07/26/20 1942    Education Details HEP    Person(s) Educated Patient    Methods Explanation;Demonstration;Tactile cues;Verbal cues;Handout    Comprehension Verbalized understanding;Returned demonstration;Verbal cues required;Tactile cues required;Need further instruction            PT Short Term Goals - 06/19/20 1252      PT SHORT TERM GOAL #1   Title She will be indpendent with intial HEP    Baseline no program for LBP    Time 3    Period Weeks    Status New      PT SHORT TERM GOAL #2   Title She will report 10-20% decreased in overall pain  and LT  LE symptoms    Baseline Varies from moderate to severe with LT leg symptoms    Time 3    Period Weeks    Status New      PT SHORT TERM GOAL #3   Title She will demo understanding of good posture sitting and standing to decrease lower back pain.    Baseline increased lordosis with decr hip extensor flexibility    Time 3    Period Weeks    Status New             PT Long Term Goals -  06/19/20 1301      PT LONG TERM GOAL #1   Title She will be independent with all HEP issued    Baseline independent with initial HEP    Time 6    Period Weeks    Status New      PT LONG TERM GOAL #2   Title She will report her back pain as intermittant    Baseline constant back pain    Time 6    Period Weeks    Status New      PT LONG TERM GOAL #3   Title she will report no LT leg symptoms    Baseline internmittant leg symptoms    Time 6    Period Weeks    Status New      PT LONG TERM GOAL #4   Title she will report 75% improvement in tolerance of chid care and home tasks.    Baseline mod to severe pain causes  need for rest periods due to pain.    Time 6    Period Weeks    Status New      PT LONG TERM GOAL #5   Title She will be able to rise from sitting with no pain.    Baseline pain upon riseing from chair    Time 6    Period Weeks    Status New                 Plan - 07/26/20 1945    Clinical Impression Statement Pt was advised she would need another referral for her neck and shoulders. PT was completed to address low back flexibility and core strengthening s aggrevating her neck and shoulders. Following the PT session pt stated her neck and shoulders were not hurting wrose and she was not experiencing low back pain.    Personal Factors and Comorbidities Comorbidity 1;Comorbidity 2    Comorbidities Post partum, obesity    Examination-Activity Limitations Bed Mobility;Caring for Others;Carry;Lift;Stand;Stairs;Squat;Sit;Reach Overhead;Transfers    Examination-Participation Restrictions Cleaning;Community Activity;Shop;Other  Stability/Clinical Decision Making Evolving/Moderate complexity    Clinical Decision Making Moderate    Rehab Potential Good    PT Frequency 2x / week    PT Duration 6 weeks    PT Treatment/Interventions Therapeutic exercise    PT Next Visit Plan Assess reponse to HEP    PT Home Exercise Plan [id number]    Consulted and Agree with  Plan of Care Patient           Patient will benefit from skilled therapeutic intervention in order to improve the following deficits and impairments:  Decreased activity tolerance, Decreased mobility, Pain, Obesity, Difficulty walking, Decreased range of motion, Postural dysfunction, Increased muscle spasms  Visit Diagnosis: Muscle weakness (generalized)  Chronic left-sided low back pain with left-sided sciatica  Difficulty in walking, not elsewhere classified     Problem List Patient Active Problem List   Diagnosis Date Noted  . PPD screening test 06/20/2020  . Annual physical exam 06/20/2020  . 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 History of gestational hypertension 10/10/2019  . Failed Tubal ligation 08/28/2019  . Chronic vomiting 06/07/2019  . Depression 06/07/2019  . Sickle cell trait (HCC) 05/01/2018  . BMI 35-39 05/01/2018  . Current smoker 01/11/2014    Joellyn Rued MS, PT 07/26/20 8:03 PM  [facility] [address] [place], [place], [address] Phone: [phone]   Fax:  [phone]  Name: Katelyn Mcfarland MRN: [id number] Date of Birth: Jul 14, 1991

## 2020-07-31 ENCOUNTER — Other Ambulatory Visit: Payer: Self-pay

## 2020-07-31 ENCOUNTER — Ambulatory Visit: Payer: Medicaid Other | Admitting: Physical Therapy

## 2020-07-31 DIAGNOSIS — M6281 Muscle weakness (generalized): Secondary | ICD-10-CM

## 2020-07-31 DIAGNOSIS — M5442 Lumbago with sciatica, left side: Secondary | ICD-10-CM

## 2020-07-31 DIAGNOSIS — R262 Difficulty in walking, not elsewhere classified: Secondary | ICD-10-CM

## 2020-07-31 DIAGNOSIS — G8929 Other chronic pain: Secondary | ICD-10-CM

## 2020-07-31 NOTE — Therapy (Signed)
[facility] Outpatient Rehabilitation [facility] [address] [place], [place], [address] Phone: [phone]   Fax:  [phone]  Physical Therapy Treatment  Patient Details  Name: Katelyn Mcfarland MRN: [id number] Date of Birth: 10/03/1991 Referring Provider (PT): Marylou Flesher, MD   Encounter Date: 07/31/2020   PT End of Session - 07/31/20 1917    Visit Number 3    Number of Visits 12    Date for PT Re-Evaluation 09/23/20    Authorization Type MCD    Authorization Time Period 10/13-12/12    Authorization - Visit Number 2    Authorization - Number of Visits 12    PT Start Time 1850    PT Stop Time 1917    PT Time Calculation (min) 27 min    Activity Tolerance Patient tolerated treatment well;Patient limited by pain    Behavior During Therapy Anxious           Past Medical History:  Diagnosis Date  . Allergy   . Chlamydia    age 56yo; hx/o trichomonas age 55yo  . Chronic headache   . Depression    doing ok, declined meds  . High cholesterol   . Infection    UTI  . Pregnancy induced hypertension   . Trichomonas vaginalis infection   . Vaginal Pap smear, abnormal    bx, cryo    Past Surgical History:  Procedure Laterality Date  . INDUCED ABORTION    . TUBAL LIGATION Bilateral 11/15/2018   Procedure: POST PARTUM TUBAL LIGATION;  Surgeon: Hermina Staggers, MD;  Location: [facility] BIRTHING SUITES;  Service: Gynecology;  Laterality: Bilateral;  . WISDOM TOOTH EXTRACTION      There were no vitals filed for this visit.       [facility] PT Assessment - 07/31/20 0001      Assessment   Medical Diagnosis Sciatica of left side    Referring Provider (PT) Marylou Flesher, MD    Onset Date/Surgical Date 02/28/20                         [facility] Adult PT Treatment/Exercise - 07/31/20 0001      Lumbar Exercises: Stretches   Other Lumbar Stretch Exercise self correction for Rt post illium      Manual Therapy   Manual Therapy Taping    Manual  therapy comments assistance required to be placed in self-correction position and hold    Kinesiotex Facilitate Muscle      Kinesiotix   Facilitate Muscle  SIJ star                  PT Education - 07/31/20 1923    Education Details anatomy of condition, habits to support neutral rotation    Person(s) Educated Patient    Methods Explanation    Comprehension Verbalized understanding;Need further instruction            PT Short Term Goals - 06/19/20 1252      PT SHORT TERM GOAL #1   Title She will be indpendent with intial HEP    Baseline no program for LBP    Time 3    Period Weeks    Status New      PT SHORT TERM GOAL #2   Title She will report 10-20% decreased in overall pain  and LT  LE symptoms    Baseline Varies from moderate to severe with LT leg symptoms    Time 3  Period Weeks    Status New      PT SHORT TERM GOAL #3   Title She will demo understanding of good posture sitting and standing to decrease lower back pain.    Baseline increased lordosis with decr hip extensor flexibility    Time 3    Period Weeks    Status New             PT Long Term Goals - 06/19/20 1301      PT LONG TERM GOAL #1   Title She will be independent with all HEP issued    Baseline independent with initial HEP    Time 6    Period Weeks    Status New      PT LONG TERM GOAL #2   Title She will report her back pain as intermittant    Baseline constant back pain    Time 6    Period Weeks    Status New      PT LONG TERM GOAL #3   Title she will report no LT leg symptoms    Baseline internmittant leg symptoms    Time 6    Period Weeks    Status New      PT LONG TERM GOAL #4   Title she will report 75% improvement in tolerance of chid care and home tasks.    Baseline mod to severe pain causes  need for rest periods due to pain.    Time 6    Period Weeks    Status New      PT LONG TERM GOAL #5   Title She will be able to rise from sitting with no pain.     Baseline pain upon riseing from chair    Time 6    Period Weeks    Status New                 Plan - 07/31/20 1918    Clinical Impression Statement Pt arrived wanting to RS hwer appointment due to severe pain, she was at the front desk in tears. I have not treated her before so I asked that she check in and come back for re-evaluation so I can see what I could do for her. Ultimately noted that she had a post rotation of Rt innominate with Rt QL spasm. Pt required assistance to obtain position for self correction but did report a decrease in symptoms. Ktape applied in star at SIJs for support following. Began feeling a spasm of Rt QL again after being seated and was instructed in seated forward reach for stretch. Asked her to do this stretch again as needed but to expect to be very sore- different from the pain. Pt did report a decrease in symptoms following treatment. Pt has 4 young children and is working this week so I asked her to call us each morning this week to see if there are any appointment openings that she would be able to attend.    PT Treatment/Interventions Therapeutic exercise;Cryotherapy;Manual techniques;Dry needling;Taping;Passive range of motion;Patient/family education;Therapeutic activities;Functional mobility training;Neuromuscular re-education;Moist Heat    PT Next Visit Plan Assess reponse to HEP    PT Home Exercise Plan [id number]    Consulted and Agree with Plan of Care Patient           Patient will benefit from skilled therapeutic intervention in order to improve the following deficits and impairments:  Decreased activity tolerance, Decreased mobility, Pain, Obesity, Difficulty walking, Decreased range  of motion, Postural dysfunction, Increased muscle spasms  Visit Diagnosis: Muscle weakness (generalized) - Plan: PT plan of care cert/re-cert  Chronic left-sided low back pain with left-sided sciatica - Plan: PT plan of care cert/re-cert  Difficulty in walking,  not elsewhere classified - Plan: PT plan of care cert/re-cert     Problem List Patient Active Problem List   Diagnosis Date Noted  . PPD screening test 06/20/2020  . Annual physical exam 06/20/2020  . 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 History of gestational hypertension 10/10/2019  . Failed Tubal ligation 08/28/2019  . Chronic vomiting 06/07/2019  . Depression 06/07/2019  . Sickle cell trait (HCC) 05/01/2018  . BMI 35-39 05/01/2018  . Current smoker 01/11/2014   Aysen Shieh C. Rhya Shan PT, DPT 07/31/20 7:27 PM   [facility] Outpatient Rehabilitation [facility] [address] [place], [place], [address] Phone: [phone]   Fax:  [phone]  Name: NEYTIRI ASCHE MRN: [id number] Date of Birth: 1991-05-01   Check all possible CPT codes:      []  97110 (Therapeutic Exercise)  []  92507 (SLP Treatment)  []  97112 (Neuro Re-ed)   []  92526 (Swallowing Treatment)   []  97116 (Gait Training)   []  [phone] (Cognitive Training, 1st 15 minutes) []  97140 (Manual Therapy)   []  97130 (Cognitive Training, each add'l 15 minutes)  []  97530 (Therapeutic Activities)  []  Other, List CPT Code ____________    []  97535 (Self Care)       [x]  All codes above (97110 - 97535)  []  97012 (Mechanical Traction)  []  97014 (E-stim Unattended)  []  97032 (E-stim manual)  []  97033 (Ionto)  []  97035 (Ultrasound)  []  97016 (Vaso)  []  97760 (Orthotic Fit) []  (Prosthetic Training) []  (Physical Performance Training) []  (Aquatic Therapy) []  (Canalith Repositioning) []  (Contrast Bath) []  12458 (Paraffin) []  97597 (Wound Care 1st 20 sq cm) []  97598 (Wound Care each add'l 20 sq cm)

## 2020-08-02 ENCOUNTER — Ambulatory Visit: Payer: Medicaid Other | Admitting: Physical Therapy

## 2020-08-02 ENCOUNTER — Encounter: Payer: Self-pay | Admitting: Physical Therapy

## 2020-08-02 ENCOUNTER — Ambulatory Visit: Payer: Medicaid Other

## 2020-08-02 ENCOUNTER — Other Ambulatory Visit: Payer: Self-pay

## 2020-08-02 DIAGNOSIS — M6281 Muscle weakness (generalized): Secondary | ICD-10-CM

## 2020-08-02 DIAGNOSIS — M5442 Lumbago with sciatica, left side: Secondary | ICD-10-CM

## 2020-08-02 DIAGNOSIS — G8929 Other chronic pain: Secondary | ICD-10-CM

## 2020-08-02 DIAGNOSIS — R262 Difficulty in walking, not elsewhere classified: Secondary | ICD-10-CM

## 2020-08-02 NOTE — Therapy (Signed)
[facility] Outpatient Rehabilitation [facility] [address] [place], [place], [address] Phone: [phone]   Fax:  [phone]  Physical Therapy Treatment  Patient Details  Name: Katelyn Mcfarland MRN: [id number] Date of Birth: 1991-06-03 Referring Provider (PT): Marylou Flesher, MD   Encounter Date: 08/02/2020   PT End of Session - 08/02/20 1641    Visit Number 4    Number of Visits 12    Date for PT Re-Evaluation 09/23/20    Authorization Type MCD    Authorization Time Period 10/13-12/12    Authorization - Visit Number 3    Authorization - Number of Visits 12    PT Start Time 1640   pt arrived late   PT Stop Time 1710   5 min unbilled for bathroom break   PT Time Calculation (min) 30 min    Activity Tolerance Patient tolerated treatment well    Behavior During Therapy [facility] for tasks assessed/performed           Past Medical History:  Diagnosis Date  . Allergy   . Chlamydia    age 29yo; hx/o trichomonas age 29yo  . Chronic headache   . Depression    doing ok, declined meds  . High cholesterol   . Infection    UTI  . Pregnancy induced hypertension   . Trichomonas vaginalis infection   . Vaginal Pap smear, abnormal    bx, cryo    Past Surgical History:  Procedure Laterality Date  . INDUCED ABORTION    . TUBAL LIGATION Bilateral 11/15/2018   Procedure: POST PARTUM TUBAL LIGATION;  Surgeon: Hermina Staggers, MD;  Location: [facility]IRTH[facility] SU[facility];  Service: Gynecology;  Laterality: Bilateral;  . WISDOM TOOTH EXTRACTION      There were no vitals filed for this visit.   Subjective Assessment - 08/02/20 1641    Subjective It was a little sore at night. I just feel a little tight all over. Top of my left foot hurts.    Patient Stated Goals to have less pain    Currently in Pain? Yes    Pain Score 4     Pain Location Back    Pain Orientation Lower    Pain Descriptors / Indicators Tightness                             [facility] Adult PT  Treatment/Exercise - 08/02/20 0001      Lumbar Exercises: Stretches   Other Lumbar Stretch Exercise cat/camel, child pose      Kinesiotix   Facilitate Muscle  SIJ star                  PT Education - 08/02/20 1652    Education Details pelvic floor rehab, shoes/inserts    Person(s) Educated Patient    Methods Explanation;Handout    Comprehension Verbalized understanding;Need further instruction            PT Short Term Goals - 06/19/20 1252      PT SHORT TERM GOAL #1   Title She will be indpendent with intial HEP    Baseline no program for LBP    Time 3    Period Weeks    Status New      PT SHORT TERM GOAL #2   Title She will report 10-20% decreased in overall pain  and LT  LE symptoms    Baseline Varies from moderate to severe with LT leg  symptoms    Time 3    Period Weeks    Status New      PT SHORT TERM GOAL #3   Title She will demo understanding of good posture sitting and standing to decrease lower back pain.    Baseline increased lordosis with decr hip extensor flexibility    Time 3    Period Weeks    Status New             PT Long Term Goals - 06/19/20 1301      PT LONG TERM GOAL #1   Title She will be independent with all HEP issued    Baseline independent with initial HEP    Time 6    Period Weeks    Status New      PT LONG TERM GOAL #2   Title She will report her back pain as intermittant    Baseline constant back pain    Time 6    Period Weeks    Status New      PT LONG TERM GOAL #3   Title she will report no LT leg symptoms    Baseline internmittant leg symptoms    Time 6    Period Weeks    Status New      PT LONG TERM GOAL #4   Title she will report 75% improvement in tolerance of chid care and home tasks.    Baseline mod to severe pain causes  need for rest periods due to pain.    Time 6    Period Weeks    Status New      PT LONG TERM GOAL #5   Title She will be able to rise from sitting with no pain.    Baseline  pain upon riseing from chair    Time 6    Period Weeks    Status New                 Plan - 08/02/20 1652    Clinical Impression Statement Pt was feeling much better today. Noted significant arch collapse in Lt foot, not present on Rt, that is creating concordant pain. Discussed obtaining shoes or inserts that provide support as her symptoms are not consistent with radicular pain. reports incontinence since giving birth and she was provided with referral to transfer to pelvic floor rehab and we will continue with her POC here until she is able to transfer.    PT Treatment/Interventions Therapeutic exercise;Cryotherapy;Manual techniques;Dry needling;Taping;Passive range of motion;Patient/family education;Therapeutic activities;Functional mobility training;Neuromuscular re-education;Moist Heat    PT Next Visit Plan continue core activation & gross lumbopelvic strengthening    PT Home Exercise Plan [id number]    Recommended Other Services pelvic floor rehab    Consulted and Agree with Plan of Care Patient           Patient will benefit from skilled therapeutic intervention in order to improve the following deficits and impairments:  Decreased activity tolerance, Decreased mobility, Pain, Obesity, Difficulty walking, Decreased range of motion, Postural dysfunction, Increased muscle spasms  Visit Diagnosis: Muscle weakness (generalized)  Difficulty in walking, not elsewhere classified  Chronic left-sided low back pain with left-sided sciatica     Problem List Patient Active Problem List   Diagnosis Date Noted  . PPD screening test 06/20/2020  . Annual physical exam 06/20/2020  . 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 History of gestational hypertension 10/10/2019  .  Failed Tubal ligation 08/28/2019  . Chronic vomiting 06/07/2019  . Depression 06/07/2019  . Sickle cell trait (HCC) 05/01/2018  . BMI  35-39 05/01/2018  . Current smoker 01/11/2014   Zenya Hickam C. Coral Soler PT, DPT 08/02/20 7:26 PM   [facility] Health Outpatient Rehabilitation [facility] [address] [place], [place], [address] Phone: [phone]   Fax:  [phone]  Name: Katelyn Mcfarland MRN: [id number] Date of Birth: January 07, 1991

## 2020-08-07 ENCOUNTER — Other Ambulatory Visit: Payer: Self-pay

## 2020-08-07 ENCOUNTER — Encounter: Payer: Self-pay | Admitting: Family Medicine

## 2020-08-07 ENCOUNTER — Ambulatory Visit: Payer: Medicaid Other

## 2020-08-07 ENCOUNTER — Ambulatory Visit (INDEPENDENT_AMBULATORY_CARE_PROVIDER_SITE_OTHER): Payer: Medicaid Other | Admitting: Family Medicine

## 2020-08-07 VITALS — BP 124/78 | HR 87 | Wt 224.2 lb

## 2020-08-07 DIAGNOSIS — R399 Unspecified symptoms and signs involving the genitourinary system: Secondary | ICD-10-CM

## 2020-08-07 DIAGNOSIS — M6281 Muscle weakness (generalized): Secondary | ICD-10-CM | POA: Diagnosis not present

## 2020-08-07 DIAGNOSIS — N898 Other specified noninflammatory disorders of vagina: Secondary | ICD-10-CM

## 2020-08-07 DIAGNOSIS — G8929 Other chronic pain: Secondary | ICD-10-CM

## 2020-08-07 DIAGNOSIS — J029 Acute pharyngitis, unspecified: Secondary | ICD-10-CM

## 2020-08-07 DIAGNOSIS — R350 Frequency of micturition: Secondary | ICD-10-CM | POA: Diagnosis not present

## 2020-08-07 DIAGNOSIS — M542 Cervicalgia: Secondary | ICD-10-CM

## 2020-08-07 DIAGNOSIS — R262 Difficulty in walking, not elsewhere classified: Secondary | ICD-10-CM

## 2020-08-07 LAB — POCT WET PREP (WET MOUNT)
Clue Cells Wet Prep Whiff POC: NEGATIVE
Trichomonas Wet Prep HPF POC: ABSENT

## 2020-08-07 LAB — POCT UA - MICROSCOPIC ONLY

## 2020-08-07 LAB — POCT URINALYSIS DIP (MANUAL ENTRY)
Bilirubin, UA: NEGATIVE
Glucose, UA: NEGATIVE mg/dL
Ketones, POC UA: NEGATIVE mg/dL
Leukocytes, UA: NEGATIVE
Nitrite, UA: NEGATIVE
Protein Ur, POC: NEGATIVE mg/dL
Spec Grav, UA: 1.02 (ref 1.010–1.025)
Urobilinogen, UA: 0.2 E.U./dL
pH, UA: 5.5 (ref 5.0–8.0)

## 2020-08-07 MED ORDER — METRONIDAZOLE 500 MG PO TABS: 500.0000 mg | ORAL_TABLET | Freq: Two times a day (BID) | ORAL | 0 refills | Status: DC

## 2020-08-07 NOTE — Therapy (Signed)
[facility] Outpatient Rehabilitation [facility] [address] [place], [place], [address] Phone: [phone]   Fax:  [phone]  Physical Therapy Treatment  Patient Details  Name: Katelyn Mcfarland MRN: [id number] Date of Birth: 1991/04/22 Referring Provider (PT): Marylou Flesher, MD   Encounter Date: 08/07/2020   PT End of Session - 08/07/20 1929    Visit Number 5    Number of Visits 12    Date for PT Re-Evaluation 09/23/20    Authorization Type MCD    Authorization Time Period 10/13-12/12    Authorization - Visit Number 4    Authorization - Number of Visits 12    PT Start Time 1840    PT Stop Time 1921    PT Time Calculation (min) 41 min    Activity Tolerance Patient tolerated treatment well    Behavior During Therapy [facility] for tasks assessed/performed           Past Medical History:  Diagnosis Date  . Allergy   . Chlamydia    age 28yo; hx/o trichomonas age 61yo  . Chronic headache   . Depression    doing ok, declined meds  . High cholesterol   . Infection    UTI  . Pregnancy induced hypertension   . Trichomonas vaginalis infection   . Vaginal Pap smear, abnormal    bx, cryo    Past Surgical History:  Procedure Laterality Date  . INDUCED ABORTION    . TUBAL LIGATION Bilateral 11/15/2018   Procedure: POST PARTUM TUBAL LIGATION;  Surgeon: Hermina Staggers, MD;  Location: [facility] BIRTHING SUITES;  Service: Gynecology;  Laterality: Bilateral;  . WISDOM TOOTH EXTRACTION      There were no vitals filed for this visit.   Subjective Assessment - 08/07/20 1926    Subjective Prioer to therapy, pt reported 4/10 low back pain. Following attempt at a PPT, pt experienced low back spasms    Currently in Pain? Yes    Pain Score 4     Pain Location Back    Pain Descriptors / Indicators Tightness    Pain Onset More than a month ago    Pain Frequency Intermittent                             [facility] Adult PT Treatment/Exercise - 08/07/20 0001       Lumbar Exercises: Stretches   Other Lumbar Stretch Exercise cat/camel, child pose      Lumbar Exercises: Aerobic   Nustep L2; 3 mins; pt reported l ant hip pain and the ex was Dced      Lumbar Exercises: Supine   Pelvic Tilt 3 reps    Pelvic Tilt Limitations After 3 reps , pt reports low back muscle spasms and the ex was DCed      Manual Therapy   Manual Therapy Taping;Soft tissue mobilization    Soft tissue mobilization TW to the bilateral low back paraspinals; Increased muscle tightness was noted bilat. with R>L    Kinesiotex Facilitate Muscle      Kinesiotix   Facilitate Muscle  SIJ star                  PT Education - 08/07/20 1928    Education Details Use of a tennis ball massage to the low back paraspinals    Person(s) Educated Patient    Methods Explanation;Demonstration;Tactile cues;Verbal cues    Comprehension Verbalized understanding;Returned demonstration;Verbal cues required;Tactile cues required  PT Short Term Goals - 06/19/20 1252      PT SHORT TERM GOAL #1   Title She will be indpendent with intial HEP    Baseline no program for LBP    Time 3    Period Weeks    Status New      PT SHORT TERM GOAL #2   Title She will report 10-20% decreased in overall pain  and LT  LE symptoms    Baseline Varies from moderate to severe with LT leg symptoms    Time 3    Period Weeks    Status New      PT SHORT TERM GOAL #3   Title She will demo understanding of good posture sitting and standing to decrease lower back pain.    Baseline increased lordosis with decr hip extensor flexibility    Time 3    Period Weeks    Status New             PT Long Term Goals - 06/19/20 1301      PT LONG TERM GOAL #1   Title She will be independent with all HEP issued    Baseline independent with initial HEP    Time 6    Period Weeks    Status New      PT LONG TERM GOAL #2   Title She will report her back pain as intermittant    Baseline constant  back pain    Time 6    Period Weeks    Status New      PT LONG TERM GOAL #3   Title she will report no LT leg symptoms    Baseline internmittant leg symptoms    Time 6    Period Weeks    Status New      PT LONG TERM GOAL #4   Title she will report 75% improvement in tolerance of chid care and home tasks.    Baseline mod to severe pain causes  need for rest periods due to pain.    Time 6    Period Weeks    Status New      PT LONG TERM GOAL #5   Title She will be able to rise from sitting with no pain.    Baseline pain upon riseing from chair    Time 6    Period Weeks    Status New                 Plan - 08/07/20 1930    Clinical Impression Statement With pt experiencing low back spasms with completion of a PPT ex, pt  focused on decreasing the muscle tightness of the low back per stretching and STW. Pt was provided a tennis balll for her to use a home to address the muscle tightness in her low back.A Ktape star was applied for SI jt support.    Personal Factors and Comorbidities Comorbidity 1;Comorbidity 2    Comorbidities Post partum, obesity    Examination-Activity Limitations Bed Mobility;Caring for Others;Carry;Lift;Stand;Stairs;Squat;Sit;Reach Overhead;Transfers    Examination-Participation Restrictions Cleaning;Community Activity;Shop;Other    Stability/Clinical Decision Making Evolving/Moderate complexity    Clinical Decision Making Moderate    Rehab Potential Good    PT Frequency 2x / week    PT Duration 6 weeks    PT Treatment/Interventions Therapeutic exercise;Cryotherapy;Manual techniques;Dry needling;Taping;Passive range of motion;Patient/family education;Therapeutic activities;Functional mobility training;Neuromuscular re-education;Moist Heat    PT Next Visit Plan continue core activation & gross lumbopelvic strengthening, and  STW as indicated    PT Home Exercise Plan [email or web address]    Consulted and Agree with Plan of Care Patient           Patient will  benefit from skilled therapeutic intervention in order to improve the following deficits and impairments:  Decreased activity tolerance, Decreased mobility, Pain, Obesity, Difficulty walking, Decreased range of motion, Postural dysfunction, Increased muscle spasms  Visit Diagnosis: Muscle weakness (generalized)  Difficulty in walking, not elsewhere classified  Chronic left-sided low back pain with left-sided sciatica  Neck pain     Problem List Patient Active Problem List   Diagnosis Date Noted  . PPD screening test 06/20/2020  . Annual physical exam 06/20/2020  . 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 History of gestational hypertension 10/10/2019  . Failed Tubal ligation 08/28/2019  . Chronic vomiting 06/07/2019  . Depression 06/07/2019  . Sickle cell trait (HCC) 05/01/2018  . BMI 35-39 05/01/2018  . Current smoker 01/11/2014    Joellyn Rued MS, PT 08/07/20 7:45 PM  [facility] Health Outpatient Rehabilitation [facility] [address] [place], [place], [address] Phone: [phone]   Fax:  [phone]  Name: Katelyn Mcfarland MRN: [id number] Date of Birth: 06/21/1991

## 2020-08-07 NOTE — Progress Notes (Signed)
    SUBJECTIVE:   CHIEF COMPLAINT / HPI:   Vaginal odor Patient reports that she had bacterial vaginosis which was diagnosed approximately 1 month ago.  She received full treatment and reports that she is not having any more vaginal discharge but she still has a vaginal odor.  Is unsure of why but feels like she may not have been completely treated.  Patient reports she has not been sexually active since having her child.  Urinary frequency Ports increased urinary frequency since having her child in June.  Denies any burning with urination.  Reports that she has been going to physical therapy for her back and ankle and they recommend pelvic floor rehab for her.  Sore throat Patient with sore throat.  Reports everyone in her family has had a little cough and congestion over the last few days.  Her daughter did have strep throat 2 weeks ago and has been treated and recovered.  She is concerned that she may have strep throat.  She is also had rhinorrhea.   OBJECTIVE:   BP 124/78   Pulse 87   Wt 224 lb 4 oz (101.7 kg)   SpO2 97%   Breastfeeding Yes   BMI 45.29 kg/m   Well-appearing 29 year old female, no acute distress HEENT: Rhinorrhea noted, mild posterior oropharynx erythema with no patches Cardiac: Regular rate and rhythm, no murmurs appreciated Respiratory: Normal work of breathing, lungs clear to auscultation bilaterally GU: Chaperone was present for this exam normal external female genitalia, normal vagina with no erythema, no cervical motion tenderness, mild amount of vaginal discharge, wet prep collected  ASSESSMENT/PLAN:   Increased urinary frequency Patient with increased urinary frequency since having her child.  Has discussed this with physical therapy and they recommended pelvic floor rehab.  Denies any burning with urination, fever, abdominal pain. -Physical therapy referral placed  Sore throat Patient reports sore throat for 2 days.  Has had mild cough and rhinorrhea  as well.  Reports other members in her house have also had mild cough and rhinorrhea.  Everyone is vaccinated for COVID-19 that can be.  Her daughter had strep throat 2 weeks ago when she is concerned that she has strep throat now.  Physical exam reassuring for viral upper respiratory infection. -Discussed symptomatic management -Given viral symptoms rapid strep not recommended at this time  Vaginal odor Patient with recent history of bacterial vaginosis which was treated with metronidazole 5 mg for 7 days.  Denies any vaginal discharge reports she still has an odor which has not resolved since having her BV infection.  Wet prep today showed many bacteria but no clue cells. -Retreating bacterial vaginosis with metronidazole 500 mg twice daily -Strict return precautions given     Derrel Nip, MD [facility]

## 2020-08-07 NOTE — Patient Instructions (Signed)
It was great seeing you today.  I did not see a clear diagnosis of bacterial vaginosis but I am going to prescribe you medications to treat it because there were bacteria present.  I sent a prescription for metronidazole 500 mg twice daily to your pharmacy and you should take this for 7 days.  Regarding your increased urination and request for pelvic floor rehab I have sent the referral in.  It may take up to 2 weeks to process insulin will call you to schedule an appointment.  If you have any questions, concerns please feel free to call our clinic.  I hope you have a wonderful afternoon!   Bacterial Vaginosis  Bacterial vaginosis is an infection of the vagina. It happens when too many normal germs (healthy bacteria) grow in the vagina. This infection puts you at risk for infections from sex (STIs). Treating this infection can lower your risk for some STIs. You should also treat this if you are pregnant. It can cause your baby to be born early. Follow these instructions at home: Medicines  Take over-the-counter and prescription medicines only as told by your doctor.  Take or use your antibiotic medicine as told by your doctor. Do not stop taking or using it even if you start to feel better. General instructions  If you your sexual partner is a woman, tell her that you have this infection. She needs to get treatment if she has symptoms. If you have a female partner, he does not need to be treated.  During treatment: ? Avoid sex. ? Do not douche. ? Avoid alcohol as told. ? Avoid breastfeeding as told.  Drink enough fluid to keep your pee (urine) clear or pale yellow.  Keep your vagina and butt (rectum) clean. ? Wash the area with warm water every day. ? Wipe from front to back after you use the toilet.  Keep all follow-up visits as told by your doctor. This is important. Preventing this condition  Do not douche.  Use only warm water to wash around your vagina.  Use protection when you  have sex. This includes: ? Latex condoms. ? Dental dams.  Limit how many people you have sex with. It is best to only have sex with the same person (be monogamous).  Get tested for STIs. Have your partner get tested.  Wear underwear that is cotton or lined with cotton.  Avoid tight pants and pantyhose. This is most important in summer.  Do not use any products that have nicotine or tobacco in them. These include cigarettes and e-cigarettes. If you need help quitting, ask your doctor.  Do not use illegal drugs.  Limit how much alcohol you drink. Contact a doctor if:  Your symptoms do not get better, even after you are treated.  You have more discharge or pain when you pee (urinate).  You have a fever.  You have pain in your belly (abdomen).  You have pain with sex.  Your bleed from your vagina between periods. Summary  This infection happens when too many germs (bacteria) grow in the vagina.  Treating this condition can lower your risk for some infections from sex (STIs).  You should also treat this if you are pregnant. It can cause early (premature) birth.  Do not stop taking or using your antibiotic medicine even if you start to feel better. This information is not intended to replace advice given to you by your health care provider. Make sure you discuss any questions you have  with your health care provider. Document Revised: 09/11/2017 Document Reviewed: 06/14/2016 Elsevier Patient Education  2020 [employer].

## 2020-08-08 DIAGNOSIS — R350 Frequency of micturition: Secondary | ICD-10-CM | POA: Insufficient documentation

## 2020-08-08 DIAGNOSIS — N898 Other specified noninflammatory disorders of vagina: Secondary | ICD-10-CM | POA: Insufficient documentation

## 2020-08-08 DIAGNOSIS — R070 Pain in throat: Secondary | ICD-10-CM | POA: Insufficient documentation

## 2020-08-08 DIAGNOSIS — J029 Acute pharyngitis, unspecified: Secondary | ICD-10-CM | POA: Insufficient documentation

## 2020-08-08 NOTE — Assessment & Plan Note (Signed)
Patient with increased urinary frequency since having her child.  Has discussed this with physical therapy and they recommended pelvic floor rehab.  Denies any burning with urination, fever, abdominal pain. -Physical therapy referral placed

## 2020-08-08 NOTE — Assessment & Plan Note (Signed)
Patient with recent history of bacterial vaginosis which was treated with metronidazole 5 mg for 7 days.  Denies any vaginal discharge reports she still has an odor which has not resolved since having her BV infection.  Wet prep today showed many bacteria but no clue cells. -Retreating bacterial vaginosis with metronidazole 500 mg twice daily -Strict return precautions given

## 2020-08-08 NOTE — Assessment & Plan Note (Signed)
Patient reports sore throat for 2 days.  Has had mild cough and rhinorrhea as well.  Reports other members in her house have also had mild cough and rhinorrhea.  Everyone is vaccinated for COVID-19 that can be.  Her daughter had strep throat 2 weeks ago when she is concerned that she has strep throat now.  Physical exam reassuring for viral upper respiratory infection. -Discussed symptomatic management -Given viral symptoms rapid strep not recommended at this time

## 2020-08-13 ENCOUNTER — Ambulatory Visit (INDEPENDENT_AMBULATORY_CARE_PROVIDER_SITE_OTHER): Payer: Medicaid Other | Admitting: Obstetrics & Gynecology

## 2020-08-13 ENCOUNTER — Other Ambulatory Visit: Payer: Self-pay

## 2020-08-13 VITALS — BP 132/88 | HR 88 | Wt 221.3 lb

## 2020-08-13 DIAGNOSIS — Z9851 Tubal ligation status: Secondary | ICD-10-CM

## 2020-08-13 NOTE — Patient Instructions (Signed)
Surgery to Prevent Pregnancy Female sterilization is surgery to prevent pregnancy. In this surgery, the fallopian tubes are either blocked or closed off. When the fallopian tubes are closed, the eggs that the ovaries release cannot enter the uterus, sperm cannot reach the eggs, and you cannot get pregnant. Sterilization is permanent. It should only be done if you are sure that you do not want to be able to have children. What are the sterilization surgery options? There are several kinds of female sterilization surgeries. They include:  Laparoscopic tubal ligation. In this surgery, the fallopian tubes are tied off, sealed with heat, or blocked with a clip, ring, or clamp. A small portion of each fallopian tube may also be removed. This surgery is done through several small cuts (incisions) with special instruments that are inserted into your abdomen.  Postpartum tubal ligation. This is also called a mini-laparotomy. This surgery is done right after childbirth or 1 or 2 days after childbirth. In this surgery, the fallopian tubes are tied off, sealed with heat, or blocked with a clip, ring, or clamp. A small portion of each fallopian tube may also be removed. The surgery is done through a single incision in the abdomen.  Tubal ligation during a C-section. In this surgery, the fallopian tubes are tied off, sealed with heat, or blocked with a clip, ring, or clamp. A small portion of each fallopian tube may also be removed. The surgery is done at the same time as a C-section delivery. Is sterilization safe? Generally, sterilization is safe. Complications are rare. However, there are risks. They include:  Bleeding.  Infection.  Reaction to medicine used during the procedure.  Injury to surrounding organs.  Failure of the procedure. How effective is sterilization? Sterilization is nearly 100% effective, but it can fail. In rare cases, the fallopian tubes can grow back together over time. If this  happens, pregnancy may be possible and you will be able to get pregnant again. Women who have had this procedure have a higher chance of having an ectopic pregnancy. An ectopic pregnancy is a pregnancy that happens outside of the uterus. This kind of pregnancy can lead to serious bleeding if it is not treated. What are the benefits?  It is usually effective for a lifetime.  It is usually safe.  It does not have the drawbacks of other types of birth control in that your hormones are not affected. Because of this, your menstrual periods, sexual desire, and sexual performance will not be affected. What are the drawbacks?  You will need to recover and may have complications after surgery.  If you change your mind and decide that you want to have children, you may not be able to. Sterilization may be reversed, but a reversal is not always successful.  It does not provide protection against STDs (sexually transmitted diseases).  It increases the chance of having an ectopic pregnancy. Follow these instructions at home:  Keep all follow-up visits as told by your health care provider. This is important. Summary  Female sterilization is surgery to prevent pregnancy.  There are different types of female sterilization surgeries.  Sterilization may be reversed, but a reversal is not always successful.  Sterilization does not protect against STDs. This information is not intended to replace advice given to you by your health care provider. Make sure you discuss any questions you have with your health care provider. Document Revised: 03/16/2019 Document Reviewed: 06/11/2018 Elsevier Patient Education  2020 Elsevier Inc.  
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
## 2020-08-14 ENCOUNTER — Encounter: Payer: Self-pay | Admitting: Physical Therapy

## 2020-08-14 ENCOUNTER — Ambulatory Visit: Payer: Medicaid Other | Attending: Obstetrics & Gynecology | Admitting: Physical Therapy

## 2020-08-14 DIAGNOSIS — R262 Difficulty in walking, not elsewhere classified: Secondary | ICD-10-CM | POA: Insufficient documentation

## 2020-08-14 DIAGNOSIS — G8929 Other chronic pain: Secondary | ICD-10-CM | POA: Diagnosis present

## 2020-08-14 DIAGNOSIS — M6281 Muscle weakness (generalized): Secondary | ICD-10-CM | POA: Insufficient documentation

## 2020-08-14 DIAGNOSIS — M5442 Lumbago with sciatica, left side: Secondary | ICD-10-CM | POA: Insufficient documentation

## 2020-08-14 NOTE — Therapy (Addendum)
[facility] [address] [place], [place], [address] Phone: [phone]   Fax:  [phone]  Physical Therapy Treatment/Discharge  Patient Details  Name: Katelyn Mcfarland MRN: [id number] Date of Birth: 07/09/1991 Referring Provider (PT): Lawerence Bass, MD   Encounter Date: 08/14/2020   PT End of Session - 08/14/20 1927    Visit Number 6    Number of Visits 12    Date for PT Re-Evaluation 09/23/20    Authorization Type MCD    Authorization Time Period 10/13-12/12    Authorization - Visit Number 5    Authorization - Number of Visits 12    PT Start Time 1847    PT Stop Time 1928    PT Time Calculation (min) 41 min    Activity Tolerance Patient tolerated treatment well    Behavior During Therapy WFL for tasks assessed/performed           Past Medical History:  Diagnosis Date  . Allergy   . Chlamydia    age 29yo; hx/o trichomonas age 29yo  . Chronic headache   . Depression    doing ok, declined meds  . High cholesterol   . Infection    UTI  . Pregnancy induced hypertension   . Trichomonas vaginalis infection   . Vaginal Pap smear, abnormal    bx, cryo    Past Surgical History:  Procedure Laterality Date  . INDUCED ABORTION    . TUBAL LIGATION Bilateral 11/15/2018   Procedure: POST PARTUM TUBAL LIGATION;  Surgeon: Ervin, Michael L, MD;  Location: [facility];  Service: Gynecology;  Laterality: Bilateral;  . WISDOM TOOTH EXTRACTION      There were no vitals filed for this visit.   Subjective Assessment - 08/14/20 1847    Subjective Left side is killing me. I have been trying to stay equal on my feet. I feel most pain on lateral thigh from knee to hip- started Sunday night. My left arm feels numb. I have been taking a lot of tylenol lately.    Patient Stated Goals to have less pain    Currently in Pain? Yes    Pain Score 7     Pain Location Leg    Pain Orientation Left;Upper;Lateral    Pain Descriptors /  Indicators Aching;Stabbing    Aggravating Factors  walking    Pain Relieving Factors rest                             [facility] Adult PT Treatment/Exercise - 08/14/20 0001      Lumbar Exercises: Stretches   Other Lumbar Stretch Exercise upper trap & levator stretches    Other Lumbar Stretch Exercise sink pull off      Lumbar Exercises: Aerobic   Stationary Bike 5 min L1      Lumbar Exercises: Standing   Other Standing Lumbar Exercises hip hinge with dowel      Lumbar Exercises: Supine   Other Supine Lumbar Exercises hip hinge with dowel      Manual Therapy   Soft tissue mobilization roller Lt VL & ITB; IASTM Lt upper trap & levator                    PT Short Term Goals - 06/19/20 1252      PT SHORT TERM GOAL #1   Title She will be indpendent with intial HEP    Baseline no program   for LBP    Time 3    Period Weeks    Status New      PT SHORT TERM GOAL #2   Title She will report 10-20% decreased in overall pain  and LT  LE symptoms    Baseline Varies from moderate to severe with LT leg symptoms    Time 3    Period Weeks    Status New      PT SHORT TERM GOAL #3   Title She will demo understanding of good posture sitting and standing to decrease lower back pain.    Baseline increased lordosis with decr hip extensor flexibility    Time 3    Period Weeks    Status New             PT Long Term Goals - 06/19/20 1301      PT LONG TERM GOAL #1   Title She will be independent with all HEP issued    Baseline independent with initial HEP    Time 6    Period Weeks    Status New      PT LONG TERM GOAL #2   Title She will report her back pain as intermittant    Baseline constant back pain    Time 6    Period Weeks    Status New      PT LONG TERM GOAL #3   Title she will report no LT leg symptoms    Baseline internmittant leg symptoms    Time 6    Period Weeks    Status New      PT LONG TERM GOAL #4   Title she will report 75%  improvement in tolerance of chid care and home tasks.    Baseline mod to severe pain causes  need for rest periods due to pain.    Time 6    Period Weeks    Status New      PT LONG TERM GOAL #5   Title She will be able to rise from sitting with no pain.    Baseline pain upon riseing from chair    Time 6    Period Weeks    Status New                 Plan - 08/14/20 1928    Clinical Impression Statement pt reported decreased concordant pain following treatment today. thigh pain consistent with muscular tightness in quads and UE numbness consistent with trigger points in upper trap & levator. Practiced hip hinge to reduce compensation at lumbar spine which was difficult but she was able to do with dowel for tactile cuing.    PT Treatment/Interventions Therapeutic exercise;Cryotherapy;Manual techniques;Dry needling;Taping;Passive range of motion;Patient/family education;Therapeutic activities;Functional mobility training;Neuromuscular re-education;Moist Heat    PT Next Visit Plan continue core activation & gross lumbopelvic strengthening, and STW as indicated    PT Home Exercise Plan [id number]    Consulted and Agree with Plan of Care Patient           Patient will benefit from skilled therapeutic intervention in order to improve the following deficits and impairments:  Decreased activity tolerance, Decreased mobility, Pain, Obesity, Difficulty walking, Decreased range of motion, Postural dysfunction, Increased muscle spasms  Visit Diagnosis: Muscle weakness (generalized)  Difficulty in walking, not elsewhere classified  Chronic left-sided low back pain with left-sided sciatica     Problem List Patient Active Problem List   Diagnosis Date Noted  . Increased urinary frequency   08/08/2020  . Sore throat 08/08/2020  . Vaginal odor 08/08/2020  . PPD screening test 06/20/2020  . Annual physical exam 06/20/2020  . 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 History of gestational hypertension 10/10/2019  . Failed Tubal ligation 08/28/2019  . Chronic vomiting 06/07/2019  . Depression 06/07/2019  . Sickle cell trait ([place]) 05/01/2018  . BMI 35-39 05/01/2018  . Current smoker 01/11/2014  Vendela Troung C. Takuya Lariccia PT, DPT 08/14/20 7:35 PM   [facility] [address] [place], [place], [address] Phone: [phone]   Fax:  [phone]  Name: NAIJA TROOST MRN: [id number] Date of Birth: 1990-12-31  PHYSICAL THERAPY DISCHARGE SUMMARY  Visits from Start of Care: 6  Current functional level related to goals / functional outcomes: See above   Remaining deficits: See above   Education / Equipment: Anatomy of condition, POC, HEP, exercise form/rationale  Plan: Patient agrees to discharge.  Patient goals were not met. Patient is being discharged due to not returning since the last visit.  ?????     Jonavan Vanhorn C. Dayne Chait PT, DPT 09/21/20 12:28 PM

## 2020-08-16 ENCOUNTER — Ambulatory Visit: Payer: Medicaid Other

## 2020-08-20 ENCOUNTER — Encounter: Payer: Self-pay | Admitting: Family Medicine

## 2020-08-20 ENCOUNTER — Ambulatory Visit (INDEPENDENT_AMBULATORY_CARE_PROVIDER_SITE_OTHER): Payer: Medicaid Other | Admitting: Family Medicine

## 2020-08-20 ENCOUNTER — Other Ambulatory Visit: Payer: Self-pay

## 2020-08-20 VITALS — BP 124/70 | HR 99 | Ht 59.0 in | Wt 224.4 lb

## 2020-08-20 DIAGNOSIS — M549 Dorsalgia, unspecified: Secondary | ICD-10-CM | POA: Diagnosis not present

## 2020-08-20 DIAGNOSIS — G8929 Other chronic pain: Secondary | ICD-10-CM

## 2020-08-20 DIAGNOSIS — F319 Bipolar disorder, unspecified: Secondary | ICD-10-CM | POA: Insufficient documentation

## 2020-08-20 MED ORDER — PREDNISONE 10 MG PO TABS: ORAL_TABLET | ORAL | 0 refills | Status: AC
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
## 2020-08-20 NOTE — Patient Instructions (Signed)
It was great meeting you today!  Please check-out at the front desk before leaving the clinic. I'd like to see you back in 1 week but if you need to be seen earlier than that for any new issues we're happy to fit you in, just give [name] a call!  Taking the medicine as directed and not missing any doses is one of the best things you can do to treat your depression.  Here are some things to keep in mind:  1) Side effects (stomach upset, some increased anxiety) may happen before you notice a benefit.  These side effects typically go away over time. 2) Changes to your dose of medicine or a change in medication all together is sometimes necessary 3) Most people need to be on medication at least 6-12 months 4) Many people will notice an improvement within two weeks but the full effect of the medication can take up to 4-6 weeks 5) Stopping the medication when you start feeling better often results in a return of symptoms 6) If you start having thoughts of hurting yourself or others after starting this medicine, please call me at [phone] immediately.    Feel free to call with any questions or concerns at any time, at [phone].    If you are feeling suicidal or depression symptoms worsen please immediately go to:   24 Hour Availability [facility]  [address] [place], [place] [phone] Crisis [phone]    . If you are thinking about harming yourself or having thoughts of suicide, or if you know someone who is, seek help right away. . Call your doctor or mental health care provider. . Call 911 or go to a hospital emergency room to get immediate help, or ask a friend or family member to help you do these things. . Call the [place] National Suicide Prevention Lifeline's toll-free, 24-hour hotline at 1-800-273-TALK 416-322-4028) or TTY: 1-800-799-4 TTY 215 850 9651) to talk to a trained counselor. . If you are in crisis, make sure you are not left  alone.  . If someone else is in crisis, make sure he or she is not left alone   [facility] [employer] (Domestic Violence, Rape & Victim Assistance [phone]  [facility] [facility]    (ONLY from 8am-4pm)    [phone]  [facility] Mobile Crisis Unit (24/7)   [phone]  [place] National Suicide Hotline   639-302-1976 Len Childs)    Take care,  Dr. Katherina Right [facility]

## 2020-08-22 DIAGNOSIS — M549 Dorsalgia, unspecified: Secondary | ICD-10-CM | POA: Insufficient documentation

## 2020-08-22 DIAGNOSIS — G8929 Other chronic pain: Secondary | ICD-10-CM | POA: Insufficient documentation
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
## 2020-08-30 ENCOUNTER — Telehealth: Payer: Self-pay | Admitting: Family Medicine

## 2020-08-30 NOTE — Telephone Encounter (Signed)
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 Please is wanting to know if doctor can do a virtual to f/u on mood. I told her I would ask doctor and someone would contact her to let her know. Thanks

## 2020-08-30 NOTE — Telephone Encounter (Signed)
Will forward to MD to advise on this. Keaston Pile,CMA  

## 2020-08-31 ENCOUNTER — Ambulatory Visit: Payer: Medicaid Other | Admitting: Family Medicine

## 2020-08-31 ENCOUNTER — Telehealth (INDEPENDENT_AMBULATORY_CARE_PROVIDER_SITE_OTHER): Payer: Medicaid Other | Admitting: Family Medicine

## 2020-08-31 ENCOUNTER — Telehealth: Payer: Self-pay | Admitting: Family Medicine

## 2020-08-31 DIAGNOSIS — Z91199 Patient's noncompliance with other medical treatment and regimen due to unspecified reason: Secondary | ICD-10-CM

## 2020-08-31 DIAGNOSIS — Z5329 Procedure and treatment not carried out because of patient's decision for other reasons: Secondary | ICD-10-CM

## 2020-08-31 NOTE — Telephone Encounter (Signed)
Called patient for wellness check. A small child answered the phone and then someone hung up the phone. Will call patient again on Monday.   Katha Cabal, DO PGY-2, [facility] 08/31/2020 4:58 PM

## 2020-08-31 NOTE — Telephone Encounter (Signed)
Patient scheduled for this afternoon at 2:45p for her video visit.  Klare Criss,CMA

## 2020-08-31 NOTE — Progress Notes (Signed)
Patient called and text messages sent via MyChart.  Will perform wellness check.   Katha Cabal, DO PGY-2, [facility] 09/02/2020 10:23 AM

## 2020-09-03 ENCOUNTER — Telehealth: Payer: Self-pay | Admitting: Family Medicine

## 2020-09-03 NOTE — Telephone Encounter (Signed)
Called patient again. HIPAA compliant voicemail left for patient to return my call.   Contacted [employer] who will perform wellness check.    Katha Cabal, DO PGY-2, [facility] 09/03/2020 1:45 PM

## 2020-09-03 NOTE — Telephone Encounter (Signed)
Late entry** Officer Walker called nurse line about 1 hour ago reporting the patient is fine. Officer Dan Humphreys reports she is feeling ok and missed her apts due to children activities. Office Dan Humphreys reports the patient stated she has tried calling us multiple times but no one ever answers the phone.

## 2020-09-03 NOTE — Telephone Encounter (Signed)
Patient returns call to nurse line. Patient stated you may leave a detailed message on her VM, however she will try to answer. Will forward back to PCP.

## 2020-09-04 NOTE — Telephone Encounter (Signed)
Patient Katelyn Mcfarland on nurse line to give PCP a few updates. Patient reports she will call back tomorrow if she does not hear anything. Please advise.

## 2020-09-24 NOTE — Progress Notes (Signed)
   SUBJECTIVE:   CHIEF COMPLAINT / HPI:   Chief Complaint  Patient presents with  . Back Pain     Katelyn Mcfarland is a 29 y.o. female here for back pain.   Back Pain Patient states she takes 1000  Mg BID.  Since Thursday after PT, she has been hurting really bad at work. Mostly the left side pain and right hand radiates up to her elbow. She had MRI's at [facility] yesterday. Patient stopped taking her steroid taper but "something happened". Patient sees Estate agent.     PERTINENT  PMH / PSH: reviewed and updated as appropriate   OBJECTIVE:   BP 102/64   Pulse 91   Ht 4\' 11"  (1.499 m)   Wt 224 lb (101.6 kg)   SpO2 97%   BMI 45.24 kg/m    GEN: well developed female, in no acute distress CV: regular rate and rhythm RESP: no increased work of breathing, clear to ascultation bilaterally  MSK: slow antalgic gait  SKIN: warm, dry NEURO: alert and oriented, moves all extremities appropriately PSYCH: depressedl affect, appropriate speech and behavior    ASSESSMENT/PLAN:   Bipolar 1 disorder (HCC) Uncontrolled. Pt with hx of bipolar disease though she denies being told this at her recent [facility] visit.  She is currently not on any medication.  Per recent [facility] notes patient was diagnosed with MDD with psychotic features, PTSD, anxiety, bipolar disease.  Patient was adamant that they told her she was not bipolar.  As patient is currently depressed I am apprehensive about starting SSRI/SNRI today.  She continues to have positive PHQ-9 with question #9 remaining 1. As patient did not like her [facility] experience advised her to go seek help at [facility] on [address].  She is agreeable to walking-in today. Contact information provided.  Suicide hotline provided.  See AVS for further details.  Discussed with Dr. [facility], clinical psychologist who agrees with plan  Depression Depression screen [facility] 2/9 09/25/2020 08/20/2020 06/20/2020 09/01/2019 05/13/2019   Decreased Interest 1 2 0 3 0  Down, Depressed, Hopeless 1 2 0 3 0  PHQ - 2 Score 2 4 0 6 0  Altered sleeping 2 2 3 3  -  Tired, decreased energy 3 3 3 3  -  Change in appetite 3 3 0 3 -  Feeling bad or failure about yourself  2 3 0 3 -  Trouble concentrating 3 3 0 3 -  Moving slowly or fidgety/restless 0 0 0 2 -  Suicidal thoughts 1 1 0 0 -  PHQ-9 Score 16 19 6 23  -  Difficult doing work/chores Somewhat difficult Very difficult Somewhat difficult Extremely dIfficult -  Some recent data might be hidden   Patient to follow up with [facility].  Defer medication at this time.    Chronic back pain Uncontrolled. Recent MRI in [facility] not available for review. Is concurrently being followed by [facility] for same.  Discussed with patient for safety reasons we will need to have one person treating her back pain if possible.  Patient agreeable.  Defer care to specialists.        , DO PGY-2, [facility] 09/25/2020

## 2020-09-25 ENCOUNTER — Encounter: Payer: Self-pay | Admitting: Family Medicine

## 2020-09-25 ENCOUNTER — Other Ambulatory Visit: Payer: Self-pay

## 2020-09-25 ENCOUNTER — Ambulatory Visit (INDEPENDENT_AMBULATORY_CARE_PROVIDER_SITE_OTHER): Payer: Medicaid Other | Admitting: Family Medicine

## 2020-09-25 DIAGNOSIS — F319 Bipolar disorder, unspecified: Secondary | ICD-10-CM | POA: Diagnosis not present

## 2020-09-25 DIAGNOSIS — F333 Major depressive disorder, recurrent, severe with psychotic symptoms: Secondary | ICD-10-CM

## 2020-09-25 DIAGNOSIS — G8929 Other chronic pain: Secondary | ICD-10-CM | POA: Diagnosis not present

## 2020-09-25 DIAGNOSIS — M549 Dorsalgia, unspecified: Secondary | ICD-10-CM | POA: Diagnosis not present

## 2020-09-25 NOTE — Patient Instructions (Signed)
It was great seeing you today.    Visit Remembers: - Immediately go to:   24 Hour Availability [facility]  [address] [place], [place] [phone] Crisis [phone]  . If you are thinking about harming yourself or having thoughts of suicide, or if you know someone who is, seek help right away. . Call your doctor or mental health care provider. . Call 911 or go to a hospital emergency room to get immediate help, or ask a friend or family member to help you do these things. . Call the [place] National Suicide Prevention Lifeline's toll-free, 24-hour hotline at 1-800-273-TALK 713-086-0141) or TTY: 1-800-799-4 TTY 314-581-1638) to talk to a trained counselor. . If you are in crisis, make sure you are not left alone.  . If someone else is in crisis, make sure he or she is not left alone   [facility] the [employer] (Domestic Violence, Rape & Victim Assistance [phone]  [facility] [facility]    (ONLY from 8am-4pm)    [phone]  [facility] Mobile Crisis Unit (24/7)   [phone]  [place] National Suicide Hotline   [phone] Len Childs)    Take care,  Dr. Katherina [facility]

## 2020-09-27 NOTE — Assessment & Plan Note (Signed)
Uncontrolled. Pt with hx of bipolar disease though she denies being told this at her recent [facility] visit.  She is currently not on any medication.  Per recent [facility] notes patient was diagnosed with MDD with psychotic features, PTSD, anxiety, bipolar disease.  Patient was adamant that they told her she was not bipolar.  As patient is currently depressed I am apprehensive about starting SSRI/SNRI today.  She continues to have positive PHQ-9 with question #9 remaining 1. As patient did not like her [facility] experience advised her to go seek help at [facility] [address].  She is agreeable to walking-in today. Contact information provided.  Suicide hotline provided.  See AVS for further details.  Discussed with Dr. Shawnee Knapp, clinical psychologist who agrees with plan

## 2020-09-27 NOTE — Assessment & Plan Note (Signed)
Depression screen [facility] 2/9 09/25/2020 08/20/2020 06/20/2020 09/01/2019 05/13/2019  Decreased Interest 1 2 0 3 0  Down, Depressed, Hopeless 1 2 0 3 0  PHQ - 2 Score 2 4 0 6 0  Altered sleeping 2 2 3 3  -  Tired, decreased energy 3 3 3 3  -  Change in appetite 3 3 0 3 -  Feeling bad or failure about yourself  2 3 0 3 -  Trouble concentrating 3 3 0 3 -  Moving slowly or fidgety/restless 0 0 0 2 -  Suicidal thoughts 1 1 0 0 -  PHQ-9 Score 16 19 6 23  -  Difficult doing work/chores Somewhat difficult Very difficult Somewhat difficult Extremely dIfficult -  Some recent data might be hidden   Patient to follow up with [facility].  Defer medication at this time.

## 2020-09-27 NOTE — Assessment & Plan Note (Signed)
Uncontrolled. Recent MRI in [facility] not available for review. Is concurrently being followed by [facility] for same.  Discussed with patient for safety reasons we will need to have one person treating her back pain if possible.  Patient agreeable.  Defer care to specialists.

## 2020-10-04 ENCOUNTER — Telehealth: Payer: Self-pay

## 2020-10-04 ENCOUNTER — Other Ambulatory Visit: Payer: Self-pay | Admitting: Family Medicine

## 2020-10-04 MED ORDER — ONDANSETRON 4 MG PO TBDP: 4.0000 mg | ORAL_TABLET | Freq: Three times a day (TID) | ORAL | 0 refills | Status: DC | PRN

## 2020-10-04 NOTE — Telephone Encounter (Signed)
Patient calls nurse line requesting refill on zofran. Patient reports that she has had intermittent nausea, pressure on bladder.   Denies painful urination, current fever (thought she may have had a fever a few days ago at work), back pain.   Patient also has concerns for possible UTI. Advised patient to be evaluated in UC for further evaluation and management of concern.   Veronda Prude, RN

## 2020-10-16 ENCOUNTER — Encounter: Payer: Self-pay | Admitting: Physical Therapy

## 2020-10-16 ENCOUNTER — Ambulatory Visit: Payer: Medicaid Other | Attending: Family Medicine | Admitting: Physical Therapy

## 2020-10-16 ENCOUNTER — Other Ambulatory Visit: Payer: Self-pay

## 2020-10-16 DIAGNOSIS — G8929 Other chronic pain: Secondary | ICD-10-CM | POA: Insufficient documentation

## 2020-10-16 DIAGNOSIS — M545 Low back pain, unspecified: Secondary | ICD-10-CM | POA: Diagnosis present

## 2020-10-16 DIAGNOSIS — R279 Unspecified lack of coordination: Secondary | ICD-10-CM | POA: Diagnosis present

## 2020-10-16 DIAGNOSIS — M6281 Muscle weakness (generalized): Secondary | ICD-10-CM | POA: Insufficient documentation

## 2020-10-16 DIAGNOSIS — M6289 Other specified disorders of muscle: Secondary | ICD-10-CM | POA: Diagnosis not present
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
## 2020-10-22 ENCOUNTER — Ambulatory Visit (INDEPENDENT_AMBULATORY_CARE_PROVIDER_SITE_OTHER): Payer: Medicaid Other | Admitting: Family Medicine

## 2020-10-22 ENCOUNTER — Encounter: Payer: Self-pay | Admitting: Family Medicine

## 2020-10-22 ENCOUNTER — Other Ambulatory Visit: Payer: Self-pay

## 2020-10-22 VITALS — BP 104/70 | HR 97 | Ht 59.0 in | Wt 226.2 lb

## 2020-10-22 DIAGNOSIS — Z23 Encounter for immunization: Secondary | ICD-10-CM

## 2020-10-22 DIAGNOSIS — R300 Dysuria: Secondary | ICD-10-CM | POA: Diagnosis not present

## 2020-10-22 DIAGNOSIS — R350 Frequency of micturition: Secondary | ICD-10-CM

## 2020-10-22 LAB — POCT URINALYSIS DIP (MANUAL ENTRY)
Bilirubin, UA: NEGATIVE
Glucose, UA: NEGATIVE mg/dL
Ketones, POC UA: NEGATIVE mg/dL
Leukocytes, UA: NEGATIVE
Nitrite, UA: NEGATIVE
Protein Ur, POC: NEGATIVE mg/dL
Spec Grav, UA: 1.02 (ref 1.010–1.025)
Urobilinogen, UA: 0.2 E.U./dL
pH, UA: 5.5 (ref 5.0–8.0)

## 2020-10-22 LAB — POCT UA - MICROSCOPIC ONLY

## 2020-10-22 NOTE — Progress Notes (Signed)
    SUBJECTIVE:   CHIEF COMPLAINT / HPI:   Urinary Frequency: Patient presents today with continued concerns for urinary frequency  She reports that she was at an urgent care clinic in September 2021, had urinalysis performed as well as wet mount from cervical vaginal self swab.  Wet prep was positive for bacteria but negative for whiff test and clue cells, UA showed trace blood.  Patient reports that at the time she was treated for bacterial vaginosis.  At that time she was also having some nausea, occasional vomiting, urinary frequency, urgency.  She reports today that she continues to have sensations of urinary frequency, nausea, bladder discomfort, occasional vomiting, and urinary urgency with occasional incontinence.  She reports that she just started pelvic floor rehab, has only had 1 visit so far.  She reports occasionally having a weak stream with urination which she describes as "I know that I am peeing but do not really feel it".  Also reports some incontinence with coughing, sneezing, laughing.  No fevers, body aches, chills.  Ms. Prajapati is a hard working, busy single mom of 4 children and it is occasionally difficult for her to find time to take care of herself, which is why she is just now following up for this issue that occurred about 3 months ago.  PERTINENT  PMH / PSH:  Bipolar 1 disorder, gHTN, chronic nausea/vomiting, BMI 45, tobacco use disorder  OBJECTIVE:   BP 104/70   Pulse 97   Ht 4\' 11"  (1.499 m)   Wt 226 lb 4 oz (102.6 kg)   SpO2 98%   BMI 45.70 kg/m    Physical exam: General: Very pleasant, well-appearing, no apparent distress Respiratory: comfortable work of breathing, speaking complete sentences   ASSESSMENT/PLAN:   Urinary frequency Patient presenting with urinary frequency, she is concerned for UTI.  Urinalysis negative for acute UTI.  Trace lysed blood appreciated on test is insignificant. -No antibiotics at this time -Patient given list of bladder  irritants that could potentially be causing her sensations of urinary frequency  Need for immunization against influenza -Patient received flu vaccine today     , DO [facility]

## 2020-10-22 NOTE — Patient Instructions (Addendum)
List of Common Bladder Irritants*  Alcoholic beverages  Apples and apple juice  Cantaloupe  Carbonated beverages  Chili and spicy foods  Chocolate  Citrus fruit  Coffee (including decaffeinated)  Cranberries and cranberry juice  Grapes  Guava  Milk Products: milk, cheese, cottage cheese, yogurt, ice cream   Peaches  Pineapple  Plums  Strawberries  Sugar especially artificial sweeteners, saccharin, aspartame, corn sweeteners, honey, fructose, sucrose, lactose  Tea  Tomatoes and tomato juice  Vitamin B complex  Vinegar  *Most people are not sensitive to ALL of these products; your goal is to find the foods that make YOUR symptoms better  If there is blood on your microscopic eval I will place a referral to Urology.    Peggyann Shoals, DO [facility], PGY-3 10/22/2020 12:07 PM

## 2020-10-25 DIAGNOSIS — Z23 Encounter for immunization: Secondary | ICD-10-CM | POA: Insufficient documentation

## 2020-10-25 DIAGNOSIS — R350 Frequency of micturition: Secondary | ICD-10-CM | POA: Insufficient documentation

## 2020-10-25 DIAGNOSIS — R300 Dysuria: Secondary | ICD-10-CM | POA: Insufficient documentation

## 2020-10-25 NOTE — Assessment & Plan Note (Signed)
Patient presenting with urinary frequency, she is concerned for UTI.  Urinalysis negative for acute UTI.  Trace lysed blood appreciated on test is insignificant. -No antibiotics at this time -Patient given list of bladder irritants that could potentially be causing her sensations of urinary frequency

## 2020-10-25 NOTE — Assessment & Plan Note (Signed)
Patient received flu vaccine today. 

## 2020-10-29 ENCOUNTER — Ambulatory Visit: Payer: Medicaid Other | Admitting: Physical Therapy

## 2020-11-05 ENCOUNTER — Ambulatory Visit: Payer: Medicaid Other | Admitting: Physical Therapy

## 2020-11-12 ENCOUNTER — Other Ambulatory Visit: Payer: Self-pay

## 2020-11-12 ENCOUNTER — Ambulatory Visit: Payer: Medicaid Other | Admitting: Physical Therapy

## 2020-11-12 DIAGNOSIS — M6289 Other specified disorders of muscle: Secondary | ICD-10-CM

## 2020-11-12 DIAGNOSIS — G8929 Other chronic pain: Secondary | ICD-10-CM

## 2020-11-12 DIAGNOSIS — R279 Unspecified lack of coordination: Secondary | ICD-10-CM

## 2020-11-12 DIAGNOSIS — M6281 Muscle weakness (generalized): Secondary | ICD-10-CM
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
## 2020-11-12 NOTE — Patient Instructions (Signed)
Access Code: [email or web address] URL: [email or web address] Date: 11/12/2020 Prepared by: Dwana Curd  Exercises Supine Pelvic Floor Contraction - 3 x daily - 7 x weekly - 1 sets - 10 reps - 1 sec hold

## 2020-12-06 ENCOUNTER — Encounter: Payer: Self-pay | Admitting: Physical Therapy

## 2020-12-06 ENCOUNTER — Ambulatory Visit: Payer: Medicaid Other | Attending: Family Medicine | Admitting: Physical Therapy

## 2020-12-06 ENCOUNTER — Other Ambulatory Visit: Payer: Self-pay

## 2020-12-06 DIAGNOSIS — R279 Unspecified lack of coordination: Secondary | ICD-10-CM | POA: Diagnosis present

## 2020-12-06 DIAGNOSIS — M545 Low back pain, unspecified: Secondary | ICD-10-CM | POA: Insufficient documentation

## 2020-12-06 DIAGNOSIS — M6281 Muscle weakness (generalized): Secondary | ICD-10-CM | POA: Diagnosis present

## 2020-12-06 DIAGNOSIS — G8929 Other chronic pain: Secondary | ICD-10-CM | POA: Insufficient documentation

## 2020-12-06 DIAGNOSIS — M6289 Other specified disorders of muscle: Secondary | ICD-10-CM | POA: Diagnosis present

## 2020-12-06 NOTE — Patient Instructions (Signed)
Access Code: [email or web address] URL: [email or web address] Date: 12/06/2020 Prepared by: Dwana Curd  Exercises Supine Pelvic Floor Contraction - 3 x daily - 7 x weekly - 1 sets - 10 reps - 1 sec hold Sidelying Thoracic Rotation with Open Book - 1 x daily - 7 x weekly - 5 reps - 1 sets - 10 sec hold Doorway Pec Stretch at 90 Degrees Abduction - 1 x daily - 7 x weekly - 1 sets - 3 reps - 30 sec hold Supine Figure 4 Piriformis Stretch - 1 x daily - 7 x weekly - 3 reps - 1 sets - 30 sec hold Supine Lower Trunk Rotation - 1 x daily - 7 x weekly - 10 reps - 1 sets - 5 sec hold
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
## 2020-12-10 ENCOUNTER — Encounter: Payer: Medicaid Other | Admitting: Physical Therapy

## 2020-12-17 ENCOUNTER — Ambulatory Visit: Payer: Medicaid Other | Attending: Family Medicine | Admitting: Physical Therapy

## 2020-12-17 ENCOUNTER — Encounter: Payer: Self-pay | Admitting: Physical Therapy

## 2020-12-17 ENCOUNTER — Other Ambulatory Visit: Payer: Self-pay

## 2020-12-17 DIAGNOSIS — M545 Low back pain, unspecified: Secondary | ICD-10-CM | POA: Insufficient documentation

## 2020-12-17 DIAGNOSIS — R279 Unspecified lack of coordination: Secondary | ICD-10-CM | POA: Insufficient documentation

## 2020-12-17 DIAGNOSIS — R262 Difficulty in walking, not elsewhere classified: Secondary | ICD-10-CM | POA: Insufficient documentation

## 2020-12-17 DIAGNOSIS — M6281 Muscle weakness (generalized): Secondary | ICD-10-CM | POA: Insufficient documentation

## 2020-12-17 DIAGNOSIS — G8929 Other chronic pain: Secondary | ICD-10-CM | POA: Diagnosis present

## 2020-12-17 DIAGNOSIS — M6289 Other specified disorders of muscle: Secondary | ICD-10-CM | POA: Insufficient documentation
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
## 2020-12-17 NOTE — Patient Instructions (Signed)
Access Code: [email or web address] URL: [email or web address] Date: 12/17/2020 Prepared by: Dwana Curd  Exercises Supine Pelvic Floor Contraction - 3 x daily - 7 x weekly - 1 sets - 10 reps - 1 sec hold Sidelying Thoracic Rotation with Open Book - 1 x daily - 7 x weekly - 5 reps - 1 sets - 10 sec hold Doorway Pec Stretch at 90 Degrees Abduction - 1 x daily - 7 x weekly - 1 sets - 3 reps - 30 sec hold Supine Figure 4 Piriformis Stretch - 1 x daily - 7 x weekly - 3 reps - 1 sets - 30 sec hold Supine Lower Trunk Rotation - 1 x daily - 7 x weekly - 10 reps - 1 sets - 5 sec hold Supine Heel Slide - 1 x daily - 7 x weekly - 3 sets - 10 reps

## 2020-12-24 ENCOUNTER — Ambulatory Visit: Payer: Medicaid Other | Admitting: Physical Therapy

## 2020-12-24 IMAGING — US US RENAL
1 series · 15 of 25 positions shown · non-contrast
Comparison: CT 04/02/2012

CLINICAL DATA: CVA tenderness.  Pregnancy.

EXAM:
RENAL / URINARY TRACT ULTRASOUND COMPLETE

[Series 1: us renal · 15 of 33 slices shown]
[im 1/33]
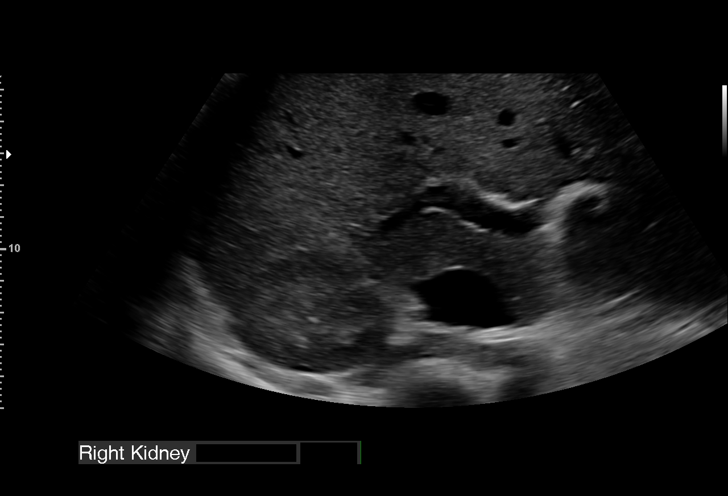
[im 3/33]
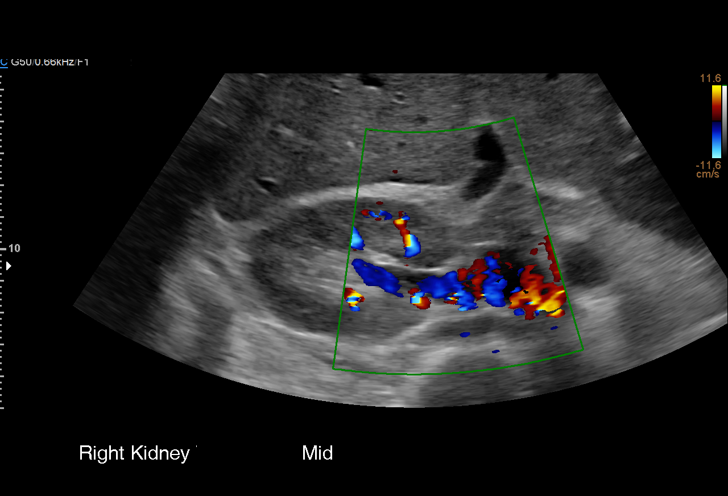
[im 6/33]
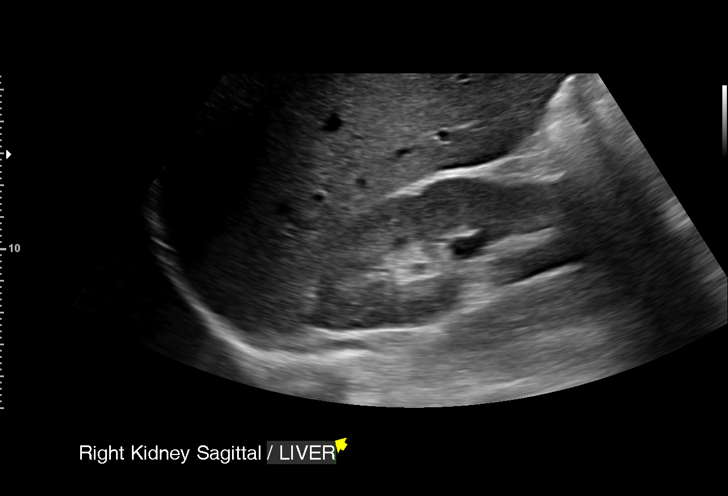
[im 7/33]
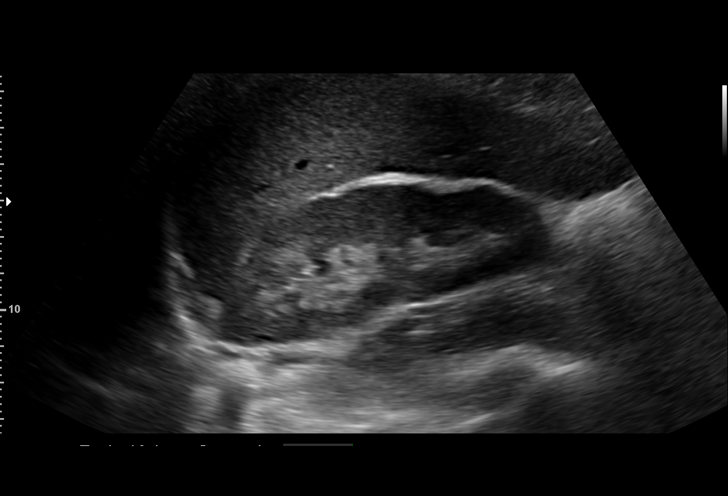
[im 10/33]
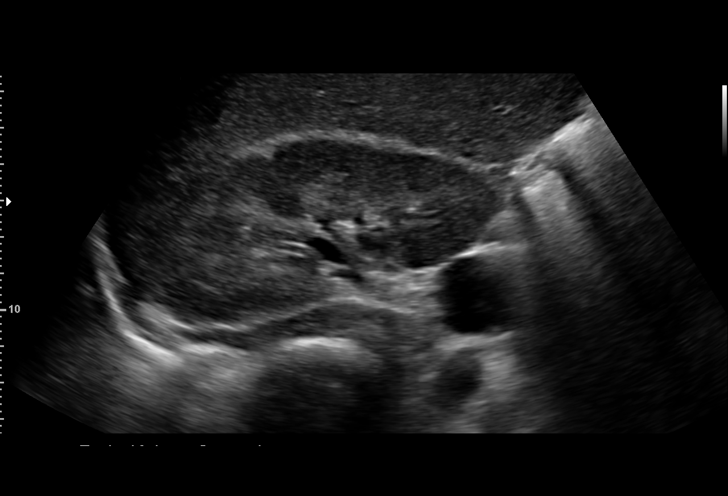
[im 13/33]
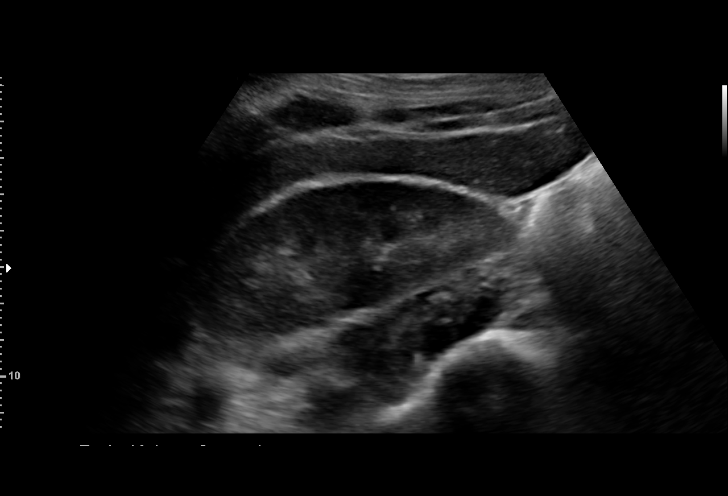
[im 14/33]
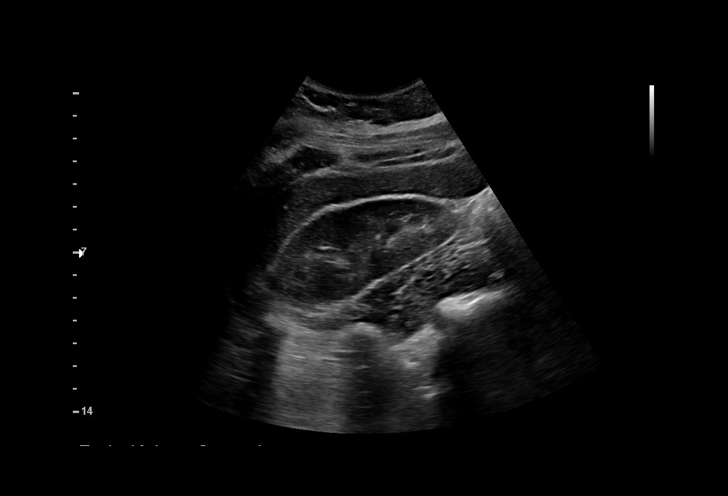
[im 17/33]
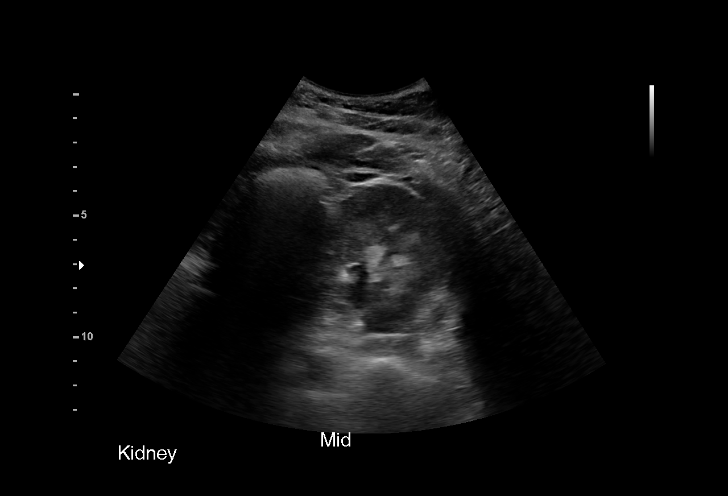
[im 19/33]
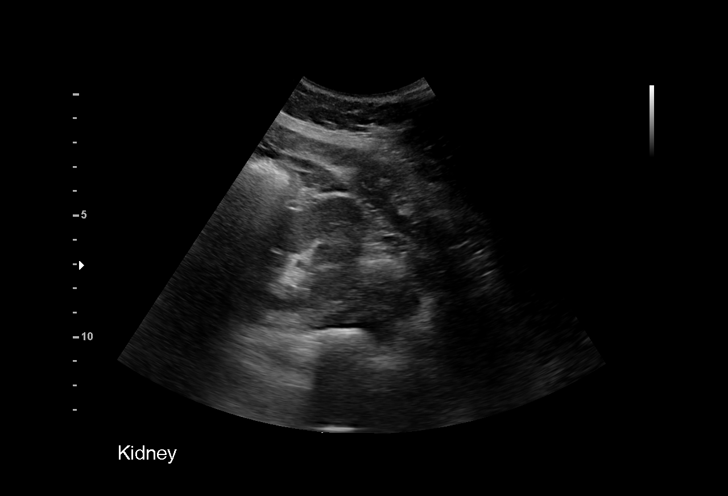
[im 21/33]
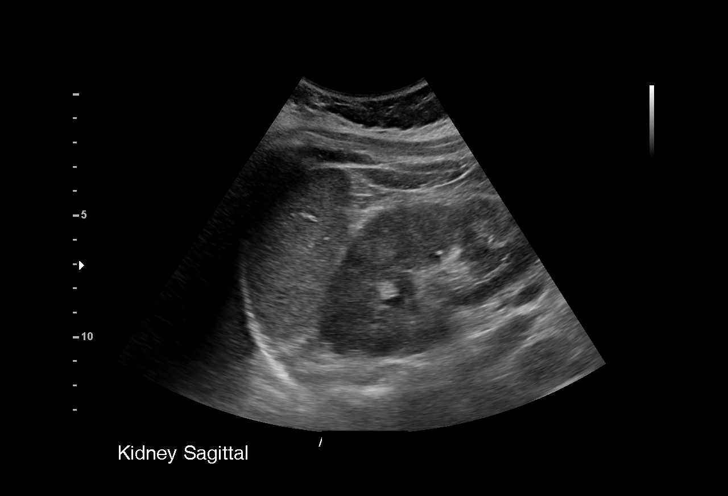
[im 23/33]
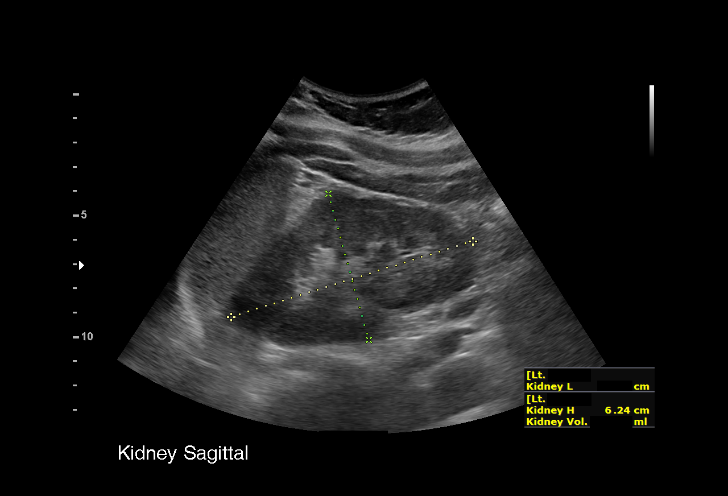
[im 26/33]
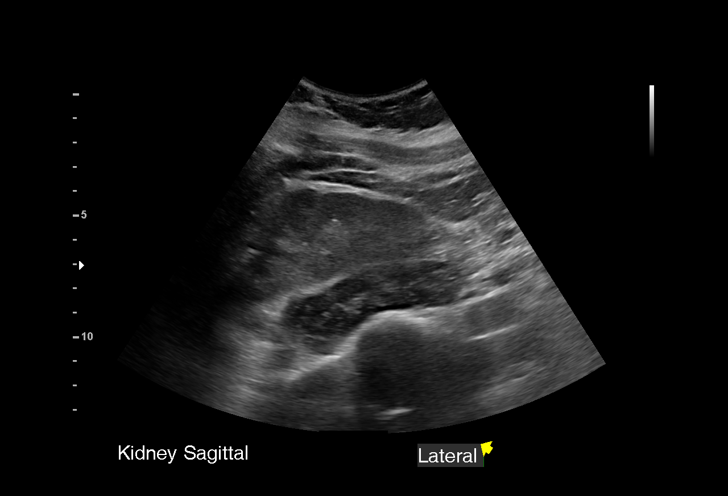
[im 27/33]
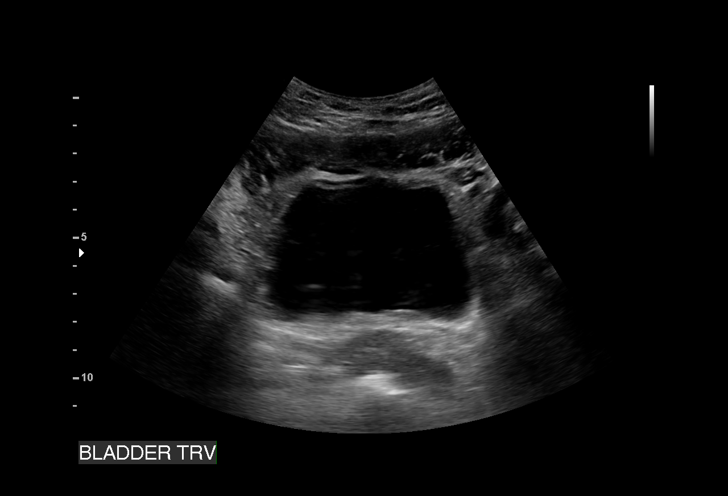
[im 30/33]
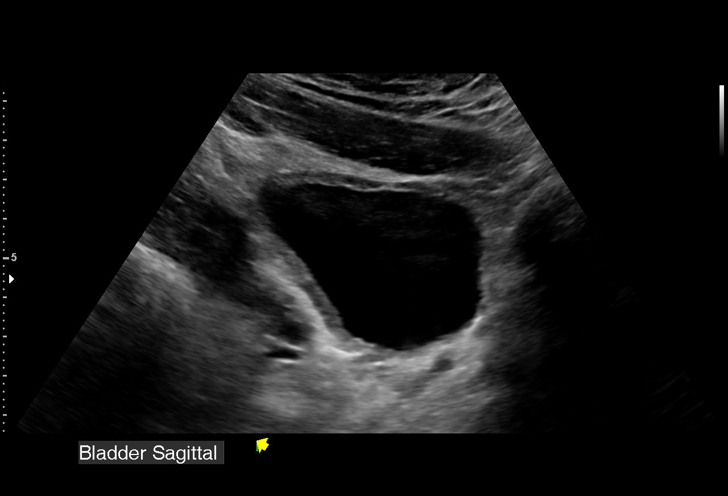
[im 33/33]
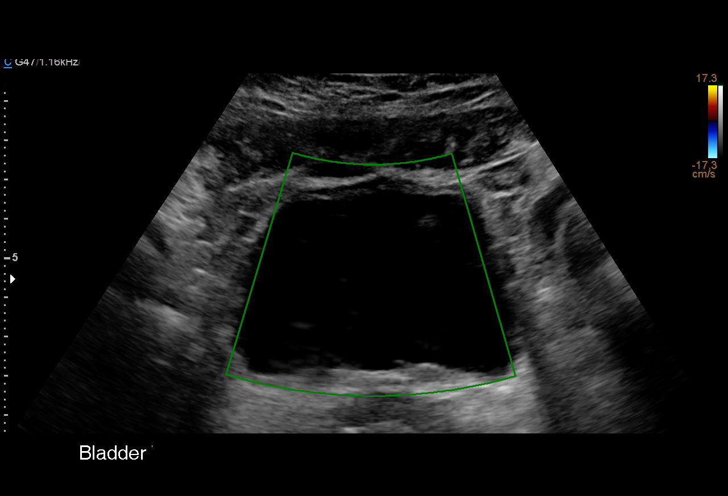

[15 of 25 positions shown; findings below may reference images not displayed]

FINDINGS: Right Kidney:

Renal measurements: 9.5 x 3.4 x 5.8 cm = volume: 98 mL .
Echogenicity within normal limits. No mass or hydronephrosis
visualized.

Left Kidney:

Renal measurements: 10.4 x 6.2 x 5.5 cm = volume: 186 mL.
Echogenicity within normal limits. No mass or hydronephrosis
visualized.

Bladder:

Appears normal for degree of bladder distention.

Other:

None.
IMPRESSION: Negative for obstructive uropathy.

## 2020-12-26 ENCOUNTER — Telehealth: Payer: Self-pay

## 2020-12-26 NOTE — Telephone Encounter (Signed)
Katelyn Mcfarland calls nurse line requesting to schedule appointment for intermittent chest pain. Katelyn Mcfarland reports this has been going on for ~2 weeks. Katelyn Mcfarland reports that she has also been having migraine headaches and neck pain. Katelyn Mcfarland is not currently having pain and has not experienced any SHOB.   Katelyn Mcfarland scheduled for next available appointment with Dr. Nobie Putnam on Friday morning.   Strict ED precautions given.   Veronda Prude, RN

## 2020-12-28 ENCOUNTER — Other Ambulatory Visit: Payer: Self-pay

## 2020-12-28 ENCOUNTER — Ambulatory Visit (INDEPENDENT_AMBULATORY_CARE_PROVIDER_SITE_OTHER): Payer: Medicaid Other | Admitting: Family Medicine

## 2020-12-28 ENCOUNTER — Encounter: Payer: Self-pay | Admitting: Family Medicine

## 2020-12-28 DIAGNOSIS — R0789 Other chest pain: Secondary | ICD-10-CM | POA: Diagnosis not present

## 2020-12-28 MED ORDER — FAMOTIDINE 20 MG PO TABS: 20.0000 mg | ORAL_TABLET | Freq: Two times a day (BID) | ORAL | 3 refills | Status: DC

## 2020-12-28 MED ORDER — ONDANSETRON 4 MG PO TBDP: 4.0000 mg | ORAL_TABLET | Freq: Three times a day (TID) | ORAL | 0 refills | Status: DC | PRN

## 2020-12-28 NOTE — Progress Notes (Signed)
    SUBJECTIVE:   CHIEF COMPLAINT / HPI:   Chest discomfort Patient reports that she has been having intermittent chest discomfort over the past 2 weeks.  Is especially on days when she wakes up and drinks a cup of coffee followed by a red bull due to fatigue and poor sleep.  The discomfort spontaneously appears and then resolves on its own approximately 1 minute later.  It does not radiate anywhere.  She acknowledges some shortness of breath at the same time that this is occurring.  She reports she has a history of reflux/GERD but was prescribed Pepcid and does not have any more so she has not been taking this.  She reports that she has not had any pain over the last day and a half.  She is incredibly fatigued taking care of her 3 children and reports she works from 3-11 and then has to wake up at 5 AM to get her oldest child ready for school.  She is seeing a therapist which she believes is helping with her anxiety and depression.  She did not fill out a PHQ-9 today because she reports she is seeing a therapist and does not want to fill it out.  Denies any SI or HI.   OBJECTIVE:   BP 114/72   Pulse 96   Ht 4\' 11"  (1.499 m)   Wt 227 lb 9.6 oz (103.2 kg)   SpO2 98%   BMI 45.97 kg/m   General: Tired appearing 30 year old female in no acute distress Cardiac: Regular rate and rhythm, no murmurs appreciated, heart rate in the 80s on auscultation Respiratory: Normal work of breathing, lungs clear to auscultation bilaterally Abdomen: Soft, nontender, positive bowel sounds MSK: No tenderness to palpation on the chest, no edema in lower extremities, no gross abnormalities Psych: Intermittently anxious regarding life stressors.  Discussed options for treatment and patient reports she is seeing a therapist and will continue to follow with her.  Denies SI or HI  ASSESSMENT/PLAN:   Chest discomfort From history and physical exam do not feel that the chest discomfort is ischemic in origin.  Patient  is concerned and I agree that anxiety may be playing a component in it.  I also feel that the large amount of caffeine that the patient is consuming is also contributing to her chest discomfort.  We discussed healthy sleep hygiene as well as decreasing the amount of caffeine she is consuming and limiting/eliminating the red bulls. -Prescription sent for Pepcid to patient's pharmacy -Patient will try to decrease/eliminate energy drinks from her daily diet -Strict return and ED precautions were given and patient is agreeable to these -If chest discomfort continues to occur consider Holter monitor or continuous cardiac monitoring.     30, MD [facility]

## 2020-12-28 NOTE — Patient Instructions (Signed)
It was great seeing you today.  I think your chest pain is related to the amount of caffeine you are consuming each day as well as reflux may be playing a role.  I am prescribing some Pepcid to help with the reflux.  I want you to decrease your caffeine intake, especially red bull.  If the symptoms persist I want you to let your PCP know and you may need long-term heart monitoring but I do not think that is necessary at this time.  If you have worsening chest pain or shortness of breath please seek medical attention immediately.  I hope you have a great day and please try to get some rest!

## 2020-12-29 DIAGNOSIS — R0789 Other chest pain: Secondary | ICD-10-CM | POA: Insufficient documentation

## 2020-12-29 NOTE — Assessment & Plan Note (Signed)
From history and physical exam do not feel that the chest discomfort is ischemic in origin.  Patient is concerned and I agree that anxiety may be playing a component in it.  I also feel that the large amount of caffeine that the patient is consuming is also contributing to her chest discomfort.  We discussed healthy sleep hygiene as well as decreasing the amount of caffeine she is consuming and limiting/eliminating the red bulls. -Prescription sent for Pepcid to patient's pharmacy -Patient will try to decrease/eliminate energy drinks from her daily diet -Strict return and ED precautions were given and patient is agreeable to these -If chest discomfort continues to occur consider Holter monitor or continuous cardiac monitoring.

## 2020-12-31 ENCOUNTER — Encounter: Payer: Self-pay | Admitting: Physical Therapy

## 2020-12-31 ENCOUNTER — Ambulatory Visit: Payer: Medicaid Other | Admitting: Physical Therapy

## 2020-12-31 ENCOUNTER — Other Ambulatory Visit: Payer: Self-pay

## 2020-12-31 DIAGNOSIS — M6289 Other specified disorders of muscle: Secondary | ICD-10-CM | POA: Diagnosis not present

## 2020-12-31 DIAGNOSIS — M6281 Muscle weakness (generalized): Secondary | ICD-10-CM

## 2020-12-31 DIAGNOSIS — R262 Difficulty in walking, not elsewhere classified: Secondary | ICD-10-CM

## 2020-12-31 DIAGNOSIS — R279 Unspecified lack of coordination: Secondary | ICD-10-CM

## 2020-12-31 DIAGNOSIS — M545 Low back pain, unspecified: Secondary | ICD-10-CM
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
## 2021-01-04 ENCOUNTER — Telehealth: Payer: Self-pay | Admitting: Pharmacist

## 2021-01-04 DIAGNOSIS — Z87891 Personal history of nicotine dependence: Secondary | ICD-10-CM

## 2021-01-04 NOTE — Telephone Encounter (Signed)
Phone follow-up of tobacco cessation - reports quit since last August (greater than 6 months).   Denies use of any medication assistance to remain tobacco free.    Patient states high level commitment to long-term abstinence.   Follow-up of chest discomfort evaluated last week by Dr. Nobie Putnam.  Patient reports taking only 1 day of famotidine due to having headache.  We discussed that the headache was more likely due to her caffeine withdrawal. Encouraged use of famotidine (QD-BID) as she continues to have some chest/gi discomfort.  Plan follow-up for long-term tobacco cessation at 1 year quit date in 05/2021 .

## 2021-01-04 NOTE — Telephone Encounter (Signed)
Noted and agree. 

## 2021-01-04 NOTE — Assessment & Plan Note (Signed)
Phone follow-up of tobacco cessation - reports quit since last August (greater than 6 months).   Denies use of any medication assistance to remain tobacco free.  Patient states high level commitment to long-term abstinence.   Plan follow-up for long-term tobacco cessation at 1 year quit date in 05/2021 .

## 2021-01-07 ENCOUNTER — Ambulatory Visit: Payer: Medicaid Other | Admitting: Physical Therapy

## 2021-01-07 ENCOUNTER — Encounter: Payer: Self-pay | Admitting: Physical Therapy

## 2021-01-07 ENCOUNTER — Other Ambulatory Visit: Payer: Self-pay

## 2021-01-07 DIAGNOSIS — M6289 Other specified disorders of muscle: Secondary | ICD-10-CM | POA: Diagnosis not present

## 2021-01-07 DIAGNOSIS — M6281 Muscle weakness (generalized): Secondary | ICD-10-CM

## 2021-01-07 DIAGNOSIS — G8929 Other chronic pain: Secondary | ICD-10-CM

## 2021-01-07 DIAGNOSIS — R279 Unspecified lack of coordination: Secondary | ICD-10-CM

## 2021-01-07 DIAGNOSIS — M545 Low back pain, unspecified: Secondary | ICD-10-CM
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
## 2021-01-14 ENCOUNTER — Ambulatory Visit: Payer: Medicaid Other | Admitting: Physical Therapy

## 2021-01-21 ENCOUNTER — Other Ambulatory Visit: Payer: Self-pay

## 2021-01-21 ENCOUNTER — Ambulatory Visit: Payer: Medicaid Other | Attending: Family Medicine | Admitting: Physical Therapy

## 2021-01-21 ENCOUNTER — Encounter: Payer: Self-pay | Admitting: Physical Therapy

## 2021-01-21 DIAGNOSIS — M545 Low back pain, unspecified: Secondary | ICD-10-CM | POA: Insufficient documentation

## 2021-01-21 DIAGNOSIS — M6281 Muscle weakness (generalized): Secondary | ICD-10-CM | POA: Diagnosis present

## 2021-01-21 DIAGNOSIS — G8929 Other chronic pain: Secondary | ICD-10-CM | POA: Diagnosis present

## 2021-01-21 DIAGNOSIS — R279 Unspecified lack of coordination: Secondary | ICD-10-CM | POA: Diagnosis present

## 2021-01-21 DIAGNOSIS — M6289 Other specified disorders of muscle: Secondary | ICD-10-CM | POA: Insufficient documentation
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
## 2021-01-28 ENCOUNTER — Encounter: Payer: Medicaid Other | Admitting: Physical Therapy

## 2021-02-04 ENCOUNTER — Ambulatory Visit: Payer: Medicaid Other | Admitting: Physical Therapy

## 2021-02-11 ENCOUNTER — Encounter: Payer: Medicaid Other | Admitting: Physical Therapy

## 2021-03-12 ENCOUNTER — Other Ambulatory Visit: Payer: Self-pay | Admitting: Family Medicine

## 2021-03-20 ENCOUNTER — Other Ambulatory Visit: Payer: Self-pay

## 2021-03-28 NOTE — Telephone Encounter (Signed)
Patient returns call to nurse line regarding rx for zofran. Advised patient that per PCP she needs to schedule an appointment. Patient states that she cannot schedule appointment at this time as she does not have any additional days off of work. Patient would like returned phone call from provider to discuss why appointment is needed. Patient states that she has history of issues with chronic vomiting.   Please advise and return call to patient at [phone].  Veronda Prude, RN

## 2021-04-16 ENCOUNTER — Other Ambulatory Visit: Payer: Self-pay

## 2021-04-16 ENCOUNTER — Ambulatory Visit (INDEPENDENT_AMBULATORY_CARE_PROVIDER_SITE_OTHER): Payer: Medicaid Other | Admitting: Family Medicine

## 2021-04-16 VITALS — BP 106/74 | HR 86 | Ht 59.0 in | Wt 233.0 lb

## 2021-04-16 DIAGNOSIS — R109 Unspecified abdominal pain: Secondary | ICD-10-CM | POA: Diagnosis present

## 2021-04-16 DIAGNOSIS — K219 Gastro-esophageal reflux disease without esophagitis: Secondary | ICD-10-CM | POA: Diagnosis not present

## 2021-04-16 DIAGNOSIS — D649 Anemia, unspecified: Secondary | ICD-10-CM

## 2021-04-16 DIAGNOSIS — R110 Nausea: Secondary | ICD-10-CM

## 2021-04-16 DIAGNOSIS — G43109 Migraine with aura, not intractable, without status migrainosus: Secondary | ICD-10-CM | POA: Insufficient documentation

## 2021-04-16 LAB — POCT UA - MICROSCOPIC ONLY

## 2021-04-16 LAB — POCT URINALYSIS DIP (MANUAL ENTRY)
Bilirubin, UA: NEGATIVE
Glucose, UA: NEGATIVE mg/dL
Ketones, POC UA: NEGATIVE mg/dL
Leukocytes, UA: NEGATIVE
Nitrite, UA: NEGATIVE
Protein Ur, POC: NEGATIVE mg/dL
Spec Grav, UA: 1.025 (ref 1.010–1.025)
Urobilinogen, UA: 0.2 E.U./dL
pH, UA: 6 (ref 5.0–8.0)

## 2021-04-16 LAB — POCT URINE PREGNANCY: Preg Test, Ur: NEGATIVE

## 2021-04-16 MED ORDER — TOPIRAMATE 25 MG PO TABS: 25.0000 mg | ORAL_TABLET | Freq: Every day | ORAL | 0 refills | Status: DC

## 2021-04-16 MED ORDER — ONDANSETRON 4 MG PO TBDP: 4.0000 mg | ORAL_TABLET | Freq: Three times a day (TID) | ORAL | 0 refills | Status: DC | PRN

## 2021-04-16 NOTE — Patient Instructions (Addendum)
It was nice seeing you today!  Referral placed for the GI doctor, they will call you to schedule an appointment.  Zofran refilled. Pepcid may also help with nausea.  I am prescribing you topiramate for your migraines.  See your eye doctor regarding your blurred vision.  I will update you with the results of your blood work.  Follow-up in 1 month.  Please arrive at least 15 minutes prior to your scheduled appointments.  Stay well, Littie Deeds, MD [facility] [phone]

## 2021-04-16 NOTE — Assessment & Plan Note (Signed)
Possibly playing a role in chronic nausea. Continue famotidine as prescribed. Assess further at follow-up and consider PPI if H2RA not effective.

## 2021-04-16 NOTE — Progress Notes (Signed)
    SUBJECTIVE:   CHIEF COMPLAINT / HPI:   Nausea Reports abdominal discomfort and nausea for years, worse in the past month. Has vomited twice in the past month. Reports urinary frequency for the past 2 weeks, concerned symptoms may be due to UTI. Denies dysuria. Has not had any sexual intercourse since last pregnancy. Denies fever but feels she is burning up (has measured temp when she feels this way). Was prescribed famotidine but lost the prescription but recently found it. Has not tried taking famotidine consistently yet. Requesting GI referral which she states was previously discussed with her PCP  Migraines Reports long history of migraines, now having 3 migraines a week. Typically lasts 2-3 days. Associated nausea. Pain is bilateral and feels it behind her eyes.  Takes Tylenol or ibuprofen for headaches, not much relief. Reports dizziness when changing positions, sometimes slows down her work.  Works as a Lawyer.  PERTINENT  PMH / PSH: bipolar disorder, obesity, migraines, GERD  OBJECTIVE:   BP 106/74   Pulse 86   Ht 4\' 11"  (1.499 m)   Wt 233 lb (105.7 kg)   LMP 04/07/2021 (Approximate)   SpO2 97%   Breastfeeding No   BMI 47.06 kg/m   General: obese, NAD CV: RRR, no murmurs Pulm: CTAB, no wheezes or rales Abd: soft, non-tender, +BS Neuro: alert, CN II-XII intact, full strength in all extremities  ASSESSMENT/PLAN:   Chronic nausea Likely multifactorial with migraine and GERD contributing. No evidence of UTI or pregnancy. Given chronicity, will go ahead and place referral to GI per patient request. - labs: CMP, lipase - Zofran refilled - amb ref GI - migraine and GERD treatment per below  Migraine with aura and without status migrainosus, not intractable Appears to be contributing to nausea. Frequency 3 times a week, would likely benefit from migraine preventive treatment. - start topiramate 25 mg daily, up-titrate as needed - consider neurology referral if not  improving - f/u 1 month  Gastroesophageal reflux disease Possibly playing a role in chronic nausea. Continue famotidine as prescribed. Assess further at follow-up and consider PPI if H2RA not effective.  Normocytic anemia Noted on labs 1 year ago. Possibly contributing to symptoms of dizziness. - labs: CBC, ferritin  04/09/2021, MD [facility]

## 2021-04-16 NOTE — Assessment & Plan Note (Signed)
Appears to be contributing to nausea. Frequency 3 times a week, would likely benefit from migraine preventive treatment. - start topiramate 25 mg daily, up-titrate as needed - consider neurology referral if not improving - f/u 1 month

## 2021-04-17 LAB — CBC
Hematocrit: 38.2 % (ref 34.0–46.6)
Hemoglobin: 12.4 g/dL (ref 11.1–15.9)
MCH: 27.7 pg (ref 26.6–33.0)
MCHC: 32.5 g/dL (ref 31.5–35.7)
MCV: 86 fL (ref 79–97)
Platelets: 272 10*3/uL (ref 150–450)
RBC: 4.47 x10E6/uL (ref 3.77–5.28)
RDW: 13.6 % (ref 11.7–15.4)
WBC: 7.3 10*3/uL (ref 3.4–10.8)

## 2021-04-17 LAB — LIPASE: Lipase: 25 U/L (ref 14–72)

## 2021-04-17 LAB — FERRITIN: Ferritin: 28 ng/mL (ref 15–150)

## 2021-04-17 LAB — COMPREHENSIVE METABOLIC PANEL
ALT: 13 IU/L (ref 0–32)
AST: 11 IU/L (ref 0–40)
Albumin/Globulin Ratio: 1.5 (ref 1.2–2.2)
Albumin: 4.1 g/dL (ref 3.8–4.8)
Alkaline Phosphatase: 71 IU/L (ref 44–121)
BUN/Creatinine Ratio: 11 (ref 9–23)
BUN: 10 mg/dL (ref 6–20)
Bilirubin Total: <0.2 mg/dL (ref 0.0–1.2)
CO2: 26 mmol/L (ref 20–29)
Calcium: 9.2 mg/dL (ref 8.7–10.2)
Chloride: 101 mmol/L (ref 96–106)
Creatinine, Ser: 0.87 mg/dL (ref 0.57–1.00)
Globulin, Total: 2.7 g/dL (ref 1.5–4.5)
Glucose: 89 mg/dL (ref 65–99)
Potassium: 4.1 mmol/L (ref 3.5–5.2)
Sodium: 139 mmol/L (ref 134–144)
Total Protein: 6.8 g/dL (ref 6.0–8.5)
eGFR: 91 mL/min/{1.73_m2} (ref >59)

## 2021-04-25 ENCOUNTER — Telehealth: Payer: Self-pay | Admitting: Family Medicine

## 2021-04-25 NOTE — Telephone Encounter (Signed)
HIPAA compliant callback message left.   Note: When she calls, please advise her that her request to switch to Dr. Nobie Putnam has been granted.  Advise this is a one time switch request. Future request may not be allowed due to clinic policy.

## 2021-05-17 ENCOUNTER — Ambulatory Visit (INDEPENDENT_AMBULATORY_CARE_PROVIDER_SITE_OTHER): Payer: Medicaid Other | Admitting: Family Medicine

## 2021-05-17 ENCOUNTER — Other Ambulatory Visit: Payer: Self-pay

## 2021-05-17 ENCOUNTER — Other Ambulatory Visit: Payer: Self-pay | Admitting: Family Medicine

## 2021-05-17 ENCOUNTER — Encounter: Payer: Self-pay | Admitting: Family Medicine

## 2021-05-17 VITALS — BP 107/61 | HR 86 | Ht 59.0 in | Wt 232.2 lb

## 2021-05-17 DIAGNOSIS — G43109 Migraine with aura, not intractable, without status migrainosus: Secondary | ICD-10-CM

## 2021-05-17 MED ORDER — ONDANSETRON 4 MG PO TBDP: 4.0000 mg | ORAL_TABLET | Freq: Three times a day (TID) | ORAL | 0 refills | Status: DC | PRN

## 2021-05-17 MED ORDER — TOPIRAMATE 50 MG PO TABS: 50.0000 mg | ORAL_TABLET | Freq: Every day | ORAL | 0 refills | Status: DC

## 2021-05-17 NOTE — Progress Notes (Signed)
    SUBJECTIVE:   CHIEF COMPLAINT / HPI:   Migraines Patient was recently started on topiramate and states that she has had minimal improvement she is still having 2 migraines per week (though does note that that is decreased from previous where she was having anywhere from 3/week to daily migraines).  Reports the intensity overall is still the same though she is now able to work through the migraines about better; she works as a Lawyer.  She has had a lot of nausea and was previously vomiting with her migraines, this has been better controlled with her Zofran.  Patient reports that she is not sexually active and has no plans to be as her last pregnancy was in the setting of a failed tubal ligation.  PERTINENT  PMH / PSH: Reviewed  OBJECTIVE:   BP 107/61   Pulse 86   Ht 4\' 11"  (1.499 m)   Wt 232 lb 3.2 oz (105.3 kg)   LMP 04/21/2021   SpO2 98%   BMI 46.90 kg/m   General: NAD, well-appearing, well-nourished Respiratory: No respiratory distress, breathing comfortably, able to speak in full sentences Skin: warm and dry, no rashes noted on exposed skin Psych: Appropriate affect and mood Neuro: CN II-XII grossly intact, no focal deficits noted  ASSESSMENT/PLAN:   Migraine with aura and without status migrainosus, not intractable Migraines have had some improvement in frequency to 2 migraines per week. Has been on topiramate 25mg .  Patient has not noticed any side effects.  Due to patient frequency of migraines would not want to pursue using an abortive medication such as Imitrex at this time, may consider in the future if appropriate. - Titrate up to 50 mg topiramate daily - Follow-up in 1 month - Recommend OTC magnesium supplementation to assist with migraine prophylaxis - Can consider neurology referral in the future if patient requests -Refilled Zofran     06/22/2021, DO  [facility]

## 2021-05-17 NOTE — Patient Instructions (Addendum)
I recommend going ahead and increasing her dose of topiramate to 50 mg daily (you are currently on 25 mg).  We will try this out for the next month and see if that helps decrease the frequency of your headaches even more.  I do want you to follow back up in the clinic in 1 month to check in and see if we need to do a different medication or how you are tolerating it in general.  You can consider getting over-the-counter magnesium supplement and see if that would also help decrease your migraines.

## 2021-05-17 NOTE — Assessment & Plan Note (Signed)
Migraines have had some improvement in frequency to 2 migraines per week. Has been on topiramate 25mg .  Patient has not noticed any side effects.  Due to patient frequency of migraines would not want to pursue using an abortive medication such as Imitrex at this time, may consider in the future if appropriate. - Titrate up to 50 mg topiramate daily - Follow-up in 1 month - Recommend OTC magnesium supplementation to assist with migraine prophylaxis - Can consider neurology referral in the future if patient requests

## 2021-05-20 ENCOUNTER — Telehealth: Payer: Self-pay

## 2021-05-20 NOTE — Telephone Encounter (Signed)
Patient calls nurse line requesting letter stating that she received her annual flu vaccination.   Letter drafted and sent through mychart, per patient request.   Veronda Prude, RN

## 2021-05-21 ENCOUNTER — Telehealth: Payer: Self-pay

## 2021-05-21 NOTE — Telephone Encounter (Signed)
Patient calls nurse line regarding worsening of migraines and blurry vision. Patient reports onset of blurry vision yesterday.   Spoke with Dr. McDiarmid.   Advised further evaluation in UC or ED.   Veronda Prude, RN

## 2021-05-22 ENCOUNTER — Other Ambulatory Visit: Payer: Self-pay

## 2021-05-22 ENCOUNTER — Emergency Department (HOSPITAL_COMMUNITY)
Admission: EM | Admit: 2021-05-22 | Discharge: 2021-05-23 | Disposition: A | Discharge status: Home / Self Care | Payer: Medicaid Other | Attending: Emergency Medicine | Admitting: Emergency Medicine

## 2021-05-22 ENCOUNTER — Encounter (HOSPITAL_COMMUNITY): Payer: Self-pay | Admitting: *Deleted

## 2021-05-22 ENCOUNTER — Emergency Department (HOSPITAL_COMMUNITY): Payer: Medicaid Other

## 2021-05-22 DIAGNOSIS — R519 Headache, unspecified: Secondary | ICD-10-CM | POA: Diagnosis not present

## 2021-05-22 DIAGNOSIS — H538 Other visual disturbances: Secondary | ICD-10-CM | POA: Diagnosis not present

## 2021-05-22 DIAGNOSIS — Z87891 Personal history of nicotine dependence: Secondary | ICD-10-CM | POA: Insufficient documentation

## 2021-05-22 DIAGNOSIS — H5713 Ocular pain, bilateral: Secondary | ICD-10-CM | POA: Diagnosis present

## 2021-05-22 DIAGNOSIS — R42 Dizziness and giddiness: Secondary | ICD-10-CM | POA: Diagnosis not present

## 2021-05-22 LAB — CBC WITH DIFFERENTIAL/PLATELET
Abs Immature Granulocytes: 0.01 10*3/uL (ref 0.00–0.07)
Basophils Absolute: 0 10*3/uL (ref 0.0–0.1)
Basophils Relative: 1 %
Eosinophils Absolute: 0.2 10*3/uL (ref 0.0–0.5)
Eosinophils Relative: 3 %
HCT: 41.4 % (ref 36.0–46.0)
Hemoglobin: 13.2 g/dL (ref 12.0–15.0)
Immature Granulocytes: 0 %
Lymphocytes Relative: 39 %
Lymphs Abs: 2.4 10*3/uL (ref 0.7–4.0)
MCH: 27.9 pg (ref 26.0–34.0)
MCHC: 31.9 g/dL (ref 30.0–36.0)
MCV: 87.5 fL (ref 80.0–100.0)
Monocytes Absolute: 0.5 10*3/uL (ref 0.1–1.0)
Monocytes Relative: 9 %
Neutro Abs: 3 10*3/uL (ref 1.7–7.7)
Neutrophils Relative %: 48 %
Platelets: 247 10*3/uL (ref 150–400)
RBC: 4.73 MIL/uL (ref 3.87–5.11)
RDW: 14.3 % (ref 11.5–15.5)
WBC: 6.1 10*3/uL (ref 4.0–10.5)
nRBC: 0 % (ref 0.0–0.2)

## 2021-05-22 LAB — I-STAT BETA HCG BLOOD, ED (MC, WL, AP ONLY): I-stat hCG, quantitative: <5 m[IU]/mL (ref <5)

## 2021-05-22 LAB — BASIC METABOLIC PANEL
Anion gap: 6 (ref 5–15)
BUN: 11 mg/dL (ref 6–20)
CO2: 27 mmol/L (ref 22–32)
Calcium: 8.9 mg/dL (ref 8.9–10.3)
Chloride: 104 mmol/L (ref 98–111)
Creatinine, Ser: 0.78 mg/dL (ref 0.44–1.00)
GFR, Estimated: >60 mL/min (ref >60)
Glucose, Bld: 92 mg/dL (ref 70–99)
Potassium: 3.8 mmol/L (ref 3.5–5.1)
Sodium: 137 mmol/L (ref 135–145)

## 2021-05-22 MED ORDER — PROCHLORPERAZINE EDISYLATE 10 MG/2ML IJ SOLN
10.0000 mg | Freq: Once | INTRAMUSCULAR | Status: AC
  Administered 2021-05-22: 10 mg via INTRAVENOUS
  Filled 2021-05-22: qty 2

## 2021-05-22 MED ORDER — KETOROLAC TROMETHAMINE 15 MG/ML IJ SOLN
15.0000 mg | Freq: Once | INTRAMUSCULAR | Status: DC
  Filled 2021-05-22: qty 1

## 2021-05-22 MED ORDER — SODIUM CHLORIDE 0.9 % IV SOLN: INTRAVENOUS | Status: DC

## 2021-05-22 MED ORDER — SODIUM CHLORIDE 0.9 % IV BOLUS
1000.0000 mL | Freq: Once | INTRAVENOUS | Status: AC
Start: 2021-05-22 — End: 2021-05-22
  Administered 2021-05-22: 1000 mL via INTRAVENOUS

## 2021-05-22 MED ORDER — TETRACAINE HCL 0.5 % OP SOLN
2.0000 [drp] | Freq: Once | OPHTHALMIC | Status: AC
  Administered 2021-05-22: 2 [drp] via OPHTHALMIC
  Filled 2021-05-22: qty 4

## 2021-05-22 MED ORDER — DIPHENHYDRAMINE HCL 50 MG/ML IJ SOLN
12.5000 mg | Freq: Once | INTRAMUSCULAR | Status: AC
Start: 2021-05-22 — End: 2021-05-22
  Administered 2021-05-22: 12.5 mg via INTRAVENOUS
  Filled 2021-05-22: qty 1

## 2021-05-22 NOTE — ED Provider Notes (Signed)
Emergency Medicine Provider Triage Evaluation Note  Katelyn Mcfarland , a 30 y.o. female  was evaluated in triage.  Pt complains of migraine x3 days.  She is also having blurry vision and dizziness which is not typical of her migraines.  She endorses photophobia, no nausea or vomiting.  She has tried a preventative migraine medicine but without relief..  Review of Systems  Positive: Migraine, dizziness Negative: Nausea, fever  Physical Exam  BP 135/78 (BP Location: Left Arm)   Pulse 80   Temp 98.7 F (37.1 C) (Oral)   Resp 18   LMP 05/01/2021   SpO2 100%  Gen:   Awake, no distress   Resp:  Normal effort  MSK:   Moves extremities without difficulty  Other:  Cranial nerves II through XII grossly intact.  Medical Decision Making  Medically screening exam initiated at 2:54 PM.  Appropriate orders placed.  Katelyn Mcfarland was informed that the remainder of the evaluation will be completed by another provider, this initial triage assessment does not replace that evaluation, and the importance of remaining in the ED until their evaluation is complete.     Theron Arista, PA-C 05/22/21 1455    Terald Sleeper, MD 05/22/21 [phone]

## 2021-05-22 NOTE — ED Provider Notes (Signed)
[facility]-EMERGENCY DEPT Provider Note   CSN: [id number] Arrival date & time: 05/22/21  1413     History Chief Complaint  Patient presents with   Headache   Blurred Vision    Katelyn Mcfarland is a 30 y.o. female with a past medical history significant for depression, hyperlipidemia, and history of migraines who presents to the ED due to a migraine x3 days.  Patient states migraine is located behind her bilateral eyes associated with blurry vision.  She also endorses dizziness.  Patient was recently started on topiramate and titrated up 3 days ago to 50 mg. She is not followed by neurology.  Denies speech changes and unilateral weakness.  No recent head injury.  Patient states this is not typical for her migraines. She rates her pain a 6/10.  No aggravating or alleviating symptoms.  No treatment prior to arrival.  History obtained from patient and past medical records. No interpreter used during encounter.       Past Medical History:  Diagnosis Date   Allergy    Chlamydia    age 73yo; hx/o trichomonas age 87yo   Chronic headache    Depression    doing ok, declined meds   High cholesterol    Infection    UTI   Pregnancy induced hypertension    Trichomonas vaginalis infection    Vaginal Pap smear, abnormal    bx, cryo    Patient Active Problem List   Diagnosis Date Noted   Migraine with aura and without status migrainosus, not intractable 04/16/2021   Gastroesophageal reflux disease 04/16/2021   Chest discomfort 12/29/2020   Dysuria 10/25/2020   Need for immunization against influenza 10/25/2020   Urinary frequency 10/25/2020   Chronic back pain 08/22/2020   Bipolar 1 disorder (HCC) 08/20/2020   PPD screening test 06/20/2020   Annual physical exam 06/20/2020   Genetic carrier status 10/25/2019   History of gestational hypertension 10/10/2019   Failed Tubal ligation 08/28/2019   Chronic vomiting 06/07/2019   Depression 06/07/2019   Sickle cell  trait (HCC) 05/01/2018   BMI 35-39 05/01/2018   History of tobacco abuse 01/11/2014    Past Surgical History:  Procedure Laterality Date   INDUCED ABORTION     TUBAL LIGATION Bilateral 11/15/2018   Procedure: POST PARTUM TUBAL LIGATION;  Surgeon: Hermina Staggers, MD;  Location: [facility];  Service: Gynecology;  Laterality: Bilateral;   WISDOM TOOTH EXTRACTION       OB History     Gravida  7   Para  4   Term  4   Preterm  0   AB  3   Living  4      SAB  2   IAB  1   Ectopic  0   Multiple  0   Live Births  4           Family History  Problem Relation Age of Onset   Hypertension Mother    Diabetes Mother    Asthma Sister    Cancer Paternal Grandmother    Cancer Paternal Grandfather    Cancer Maternal Grandmother    Kidney disease Maternal Grandmother    Diabetes Maternal Grandmother    Heart disease Neg Hx    Stroke Neg Hx     Social History   Tobacco Use   Smoking status: Former    Packs/day: 1.00    Years: 16.00    Pack years: 16.00    Types:  Cigarettes    Start date: 10/13/2000   Smokeless tobacco: Never   Tobacco comments:    stopped August 2020  Vaping Use   Vaping Use: Never used  Substance Use Topics   Alcohol use: Not Currently    Comment: not currently, socially   Drug use: Not Currently    Types: Marijuana    Comment: last was october 2020    Home Medications Prior to Admission medications   Medication Sig Start Date End Date Taking? Authorizing Provider  acetaminophen (TYLENOL) 500 MG tablet Take 1,000 mg by mouth every 6 (six) hours as needed for mild pain (or headaches).   Yes [provider]  APPLE CIDER VINEGAR PO Take 2 capsules by mouth daily.   Yes [provider]  cyclobenzaprine (FLEXERIL) 5 MG tablet Take 5 mg by mouth 3 (three) times daily as needed for muscle spasms.   Yes [provider]  famotidine (PEPCID) 20 MG tablet Take 1 tablet (20 mg total) by mouth 2 (two) times  daily. Patient taking differently: Take 20 mg by mouth 2 (two) times daily as needed for heartburn or indigestion. 12/28/20  Yes Derrel Nip, MD  ibuprofen (ADVIL) 200 MG tablet Take 800 mg by mouth every 6 (six) hours as needed for mild pain (or headaches).   Yes [provider]  naproxen sodium (ALEVE) 220 MG tablet Take 220 mg by mouth 2 (two) times daily as needed (for headaches or mild pain).   Yes [provider]  ondansetron (ZOFRAN-ODT) 4 MG disintegrating tablet Take 1 tablet (4 mg total) by mouth every 8 (eight) hours as needed for nausea or vomiting. Patient taking differently: Take 4 mg by mouth every 8 (eight) hours as needed for nausea or vomiting (dissolve orally). 05/17/21  Yes Lilland, Alana, DO  topiramate (TOPAMAX) 50 MG tablet TAKE 1 TABLET(50 MG) BY MOUTH DAILY Patient taking differently: Take 50 mg by mouth daily. 05/17/21  Yes Derrel Nip, MD  ibuprofen (ADVIL) 600 MG tablet Take 1 tablet (600 mg total) by mouth every 6 (six) hours. Patient not taking: No sig reported 04/13/20   Jacklyn Shell, CNM    Allergies    Patient has no known allergies.  Review of Systems   Review of Systems  Constitutional:  Negative for chills and fever.  Eyes:  Positive for photophobia, discharge and visual disturbance.  Musculoskeletal:  Negative for neck pain and neck stiffness.  Neurological:  Positive for dizziness and headaches. Negative for facial asymmetry and speech difficulty.  All other systems reviewed and are negative.  Physical Exam Updated Vital Signs BP (!) 143/83 (BP Location: Left Arm)   Pulse 84   Temp 98.7 F (37.1 C) (Oral)   Resp 14   LMP 05/01/2021   SpO2 100%   Physical Exam Vitals and nursing note reviewed.  Constitutional:      General: She is not in acute distress.    Appearance: She is not ill-appearing.  HENT:     Head: Normocephalic.  Eyes:     Extraocular Movements: Extraocular movements intact.     Pupils:  Pupils are equal, round, and reactive to light.     Comments: EOMs intact. No pain with EOMs.   Cardiovascular:     Rate and Rhythm: Normal rate and regular rhythm.     Pulses: Normal pulses.     Heart sounds: Normal heart sounds. No murmur heard.   No friction rub. No gallop.  Pulmonary:     Effort: Pulmonary  effort is normal.     Breath sounds: Normal breath sounds.  Abdominal:     General: Abdomen is flat. There is no distension.     Palpations: Abdomen is soft.     Tenderness: There is no abdominal tenderness. There is no guarding or rebound.  Musculoskeletal:        General: Normal range of motion.     Cervical back: Neck supple.  Skin:    General: Skin is warm and dry.  Neurological:     General: No focal deficit present.     Mental Status: She is alert.     Comments: Speech is clear, able to follow commands CN III-XII intact Normal strength in upper and lower extremities bilaterally including dorsiflexion and plantar flexion, strong and equal grip strength Sensation grossly intact throughout Moves extremities without ataxia, coordination intact No pronator drift Ambulates without difficulty  Psychiatric:        Mood and Affect: Mood normal.        Behavior: Behavior normal.    ED Results / Procedures / Treatments   Labs (all labs ordered are listed, but only abnormal results are displayed) Labs Reviewed  BASIC METABOLIC PANEL  CBC WITH DIFFERENTIAL/PLATELET  I-STAT BETA HCG BLOOD, ED ([facility], [facility], [facility] ONLY)    EKG None  Radiology CT HEAD WO CONTRAST ( )  Result Date: 05/22/2021 CLINICAL DATA:  Headache and blurred vision x3 days history of migraines. EXAM: CT HEAD WITHOUT CONTRAST TECHNIQUE: Contiguous axial images were obtained from the base of the skull through the vertex without intravenous contrast. COMPARISON:  None. FINDINGS: Brain: No evidence of acute infarction, hemorrhage, hydrocephalus, extra-axial collection or mass lesion/mass effect. Vascular: No  hyperdense vessel or unexpected calcification. Skull: Normal. Negative for fracture or focal lesion. Sinuses/Orbits: Visualized portions of the paranasal sinuses and mastoid air cells are predominantly clear. Orbits are grossly unremarkable. Other: None IMPRESSION: No acute intracranial findings. Electronically Signed   By: Maudry Mayhew MD   On: 05/22/2021 19:06    Procedures Procedures   Medications Ordered in ED Medications  sodium chloride 0.9 % bolus 1,000 mL (0 mLs Intravenous Stopped 05/22/21 2109)    And  0.9 %  sodium chloride infusion ( Intravenous New Bag/Given 05/22/21 1842)  ketorolac (TORADOL) 15 MG/ML injection 15 mg (0 mg Intravenous Hold 05/22/21 1948)  diphenhydrAMINE (BENADRYL) injection 12.5 mg (12.5 mg Intravenous Given 05/22/21 1831)  prochlorperazine (COMPAZINE) injection 10 mg (10 mg Intravenous Given 05/22/21 1828)  tetracaine (PONTOCAINE) 0.5 % ophthalmic solution 2 drop (2 drops Both Eyes Given 05/22/21 1833)    ED Course  I have reviewed the triage vital signs and the nursing notes.  Pertinent labs & imaging results that were available during my care of the patient were reviewed by me and considered in my medical decision making (see chart for details).    MDM Rules/Calculators/A&P                          30 year old female presents to the ED due to migraine associated with blurry vision and dizziness x3 days.  Patient has a history of migraines and is currently on topiramate 50 mg.  Patient notes this is atypical for her migraines given the blurry vision.  No fever or chills.  No neck stiffness.  Upon arrival, stable vitals.  Patient is afebrile, not tachycardic or hypoxic.  Patient nontoxic-appearing.  Physical exam reassuring.  Normal neurological exam. Low suspicion for CVA. No meningismus  to suggest meningitis.  Labs ordered at triage.  Given new neurological symptoms, will obtain CT head.  IV Compazine and Benadryl given we will hold Toradol until CT head  results are available.  CT personally reviewed which is negative for any acute abnormalities. IOP normal 20 right, 18 left. Dr. Karene FryLawsing performed bedside ultrasound and measured her bilateral optic nerves which were enlarged. Concern for possible idiopathic intracranial hypertension.    Visual Acuity - Bilateral Near: 20/30  Bilateral Distance: 20/30  R Near: 20/30  R Distance: 20/30  L Near: 20/40  L Distance: 20/40  Will discuss with neurology due to visual changes.   Unfortunately, patient notes she needs to go home to take care of her children. She has not been evaluated by neurology yet. I had a long discussion with patient and her mother about my concerns about leaving prior to neurology consult; however, patient notes she needs to  leave. Headache is completely resolved. Ambulatory referral placed for neurology.  Ophthalmology referral given to patient.  Advised patient to call ophthalmology tomorrow to schedule appoint for further evaluation due to suspected papilledema. Strict ED precautions discussed with patient. Patient states understanding and agrees to plan. Patient discharged home in no acute distress and stable vitals  Discussed with Dr. Karene FryLawsing who evaluated patient at bedside and agrees with assessment and plan.  Final Clinical Impression(s) / ED Diagnoses Final diagnoses:  Blurred vision, bilateral  Acute nonintractable headache, unspecified headache type    Rx / DC Orders ED Discharge Orders          Ordered    Ambulatory referral to Neurology       Comments: An appointment is requested in approximately: 1 week   05/22/21 2318             Mannie Stabileberman, Marielys Trinidad C, PA-C 05/22/21 2320    Ernie AvenaLawsing, James, MD 05/23/21 1553

## 2021-05-22 NOTE — ED Notes (Signed)
Neuro robot is ready. Patient was given more refreshments.

## 2021-05-22 NOTE — ED Triage Notes (Signed)
Pt complains of headache, blurred vision x 3 days. She tried taking new migraine medication and didn't get relief.

## 2021-05-22 NOTE — Discharge Instructions (Addendum)
It was a pleasure taking care of you today.  As discussed, your CT head was negative for any acute abnormalities.  The ultrasound we performed at bedside showed some concerned about inflamed nerve in your eye.  I have placed a referral for neurology.  Expect a phone call within the next week.  If you do not hear from them the next week please call with the number provided.  I have also included the number of the ophthalmologist.  Please call tomorrow to schedule an appointment for further evaluation.  You may take over-the-counter ibuprofen or Tylenol as needed for headache.  Return to the ER for new or worsening symptoms.

## 2021-05-23 ENCOUNTER — Ambulatory Visit (INDEPENDENT_AMBULATORY_CARE_PROVIDER_SITE_OTHER): Payer: Medicaid Other | Admitting: Family Medicine

## 2021-05-23 ENCOUNTER — Other Ambulatory Visit: Payer: Self-pay

## 2021-05-23 ENCOUNTER — Encounter: Payer: Self-pay | Admitting: Family Medicine

## 2021-05-23 VITALS — BP 126/80 | HR 72 | Ht 59.0 in | Wt 231.6 lb

## 2021-05-23 DIAGNOSIS — R200 Anesthesia of skin: Secondary | ICD-10-CM

## 2021-05-23 DIAGNOSIS — Z114 Encounter for screening for human immunodeficiency virus [HIV]: Secondary | ICD-10-CM

## 2021-05-23 DIAGNOSIS — H539 Unspecified visual disturbance: Secondary | ICD-10-CM | POA: Insufficient documentation

## 2021-05-23 DIAGNOSIS — G43109 Migraine with aura, not intractable, without status migrainosus: Secondary | ICD-10-CM | POA: Diagnosis not present

## 2021-05-23 NOTE — Patient Instructions (Addendum)
I do think it is really important for you to call and get a hold of the neurology and ophthalmology to try and get appointments.  I placed referrals again for both of those just in case you need them.  I do want to go ahead and get labs on you today to make sure there is not anything we need to follow-up on.  I do want to see you within the next 2 weeks especially if you are not able to get a neurology appointment in that time.  We may consider getting further imaging if they are not able to see you by then.  If you have worsening of these migraines, changes in visual status, unable to walk or feel like your gait is getting worse then come back into the ED.  I do want close follow-up with you.

## 2021-05-23 NOTE — Assessment & Plan Note (Signed)
Concern from ED for papilledema. Ophthalmology referral placed in clinic today and patient instructed to call the office to see if she would be able to get an appointment in the next few days.  - Placed referral to ophthalmology.

## 2021-05-23 NOTE — Progress Notes (Signed)
    SUBJECTIVE:   CHIEF COMPLAINT / HPI:   Patient reports that yesterday she went to the ER because she started to have blurry vision with her migraines and started having abnormal numb like sensations on her left side.  While she is in the ED, they got a CT scan which was negative for acute abnormalities, her physical exam was within normal limits.  Patient was given IV Compazine and Benadryl.  An ultrasound of her bilateral optic nerves was completed which showed enlargement.  They gave her the phone number for ophthalmology as well as a neurology referral at that time.  Patient reports that her symptoms have somewhat improved.  Her vision is getting better and she can now see more objects, although she is still recovering from it.  She is not having any nausea or vomiting at this time.  She does report some difficulty with her gait and that she feels little bit unsteady.  She also thinks that she is walking towards the left instead of walking straight.  She has felt more clumsy as of late but has not had any falls.   PERTINENT  PMH / PSH: Reviewed  OBJECTIVE:   BP 126/80   Pulse 72   Ht 4\' 11"  (1.499 m)   Wt 231 lb 9.6 oz (105.1 kg)   LMP 05/01/2021   SpO2 98%   BMI 46.78 kg/m   General: NAD, well-appearing, well-nourished Respiratory: No respiratory distress, breathing comfortably, able to speak in full sentences Skin: warm and dry, no rashes noted on exposed skin Psych: Appropriate affect and mood Neuro: CN II: PERRL CN III, IV,VI: EOMI CV V: Normal sensation in V1, V2, V3 CVII: Symmetric smile and brow raise CN VIII: Normal hearing CN IX,X: Symmetric palate raise  CN XI: 5/5 shoulder shrug CN XII: Symmetric tongue protrusion  UE and LE strength 5/5 2+ UE and LE reflexes  Normal sensation in UE and LE bilaterally  No ataxia with finger to nose, normal heel to shin  Negative Rhomberg    ASSESSMENT/PLAN:   Migraine with aura and without status migrainosus, not  intractable Patient had change in migraine status with new neurologic symptoms including left sided numbness and altered gait and vision. Neuro exam was overall reassuringly normal in clinic, vision is improving. Consideration for idiopathic intracranial hypertension but must also consider other diagnoses such as MS given patient's age and symptoms. Will check labs for other possible causes of neurologic changes. - F/u sed rate, HIV, B12, TSH - Neurology referral placed again - Consider MRI brain with and without for further evaluation if not able to be seen by neurology before 2 week f/u  Visual changes Concern from ED for papilledema. Ophthalmology referral placed in clinic today and patient instructed to call the office to see if she would be able to get an appointment in the next few days.  - Placed referral to ophthalmology.       05/03/2021, DO [facility]

## 2021-05-23 NOTE — Assessment & Plan Note (Addendum)
Patient had change in migraine status with new neurologic symptoms including left sided numbness and altered gait and vision. Neuro exam was overall reassuringly normal in clinic, vision is improving. Consideration for idiopathic intracranial hypertension but must also consider other diagnoses such as MS given patient's age and symptoms. Will check labs for other possible causes of neurologic changes. - F/u sed rate, HIV, B12, TSH - Neurology referral placed again - Consider MRI brain with and without for further evaluation if not able to be seen by neurology before 2 week f/u

## 2021-05-24 LAB — SEDIMENTATION RATE: Sed Rate: 4 mm/hr (ref 0–32)

## 2021-05-24 LAB — TSH: TSH: 2.17 u[IU]/mL (ref 0.450–4.500)

## 2021-05-24 LAB — VITAMIN B12: Vitamin B-12: 617 pg/mL (ref 232–1245)

## 2021-05-24 LAB — HIV ANTIBODY (ROUTINE TESTING W REFLEX): HIV Screen 4th Generation wRfx: NONREACTIVE

## 2021-06-04 ENCOUNTER — Ambulatory Visit: Payer: Medicaid Other | Admitting: Diagnostic Neuroimaging

## 2021-06-04 ENCOUNTER — Encounter: Payer: Self-pay | Admitting: Diagnostic Neuroimaging

## 2021-06-04 ENCOUNTER — Other Ambulatory Visit: Payer: Self-pay

## 2021-06-04 VITALS — BP 136/93 | HR 79 | Ht 59.0 in | Wt 228.0 lb

## 2021-06-04 DIAGNOSIS — G4489 Other headache syndrome: Secondary | ICD-10-CM

## 2021-06-04 DIAGNOSIS — H53123 Transient visual loss, bilateral: Secondary | ICD-10-CM

## 2021-06-04 DIAGNOSIS — R519 Headache, unspecified: Secondary | ICD-10-CM | POA: Diagnosis not present

## 2021-06-04 DIAGNOSIS — G43109 Migraine with aura, not intractable, without status migrainosus: Secondary | ICD-10-CM | POA: Diagnosis not present

## 2021-06-04 MED ORDER — RIZATRIPTAN BENZOATE 10 MG PO TBDP: 10.0000 mg | ORAL_TABLET | ORAL | 11 refills | Status: DC | PRN

## 2021-06-04 MED ORDER — PROPRANOLOL HCL 40 MG PO TABS: 40.0000 mg | ORAL_TABLET | Freq: Two times a day (BID) | ORAL | 6 refills | Status: DC

## 2021-06-04 NOTE — Progress Notes (Signed)
[facility]  PATIENT: Katelyn Mcfarland DOB: [birth year]  REFERRING CLINICIAN: Westley ChandlerBrown, Carina M, MD HISTORY FROM: patient  REASON FOR VISIT: new consult    HISTORICAL  CHIEF COMPLAINT:  Chief Complaint  Patient presents with   Migraine    RM 7 alone Pt is well, has been having a migraines for 2 weeks none stop now. Previously was having them about 3-4 times a week. Sometimes wakes up with them and can last all day.     HISTORY OF PRESENT ILLNESS:   30 year old female here for evaluation of headaches.  Patient started having headaches in middle school with global throbbing sensation, neck pain, eye pain, nausea and photophobia.  Sometimes she will see black spots.  Headaches can last hours at a time.  She was diagnosed with migraine headaches and treated with ibuprofen.  She has had headaches throughout the years almost on daily basis.  Headaches seem to improve slightly for past 2 to 3 years when she was pregnant and breast-feeding.  In last 3 weeks patient has had increasing headaches.  She is also had some episodes of bilateral visual loss with severe headaches.  She is having almost 20 to 25 days of headache per month.  Headaches can be unilateral or global.  She went to the emergency room a couple weeks ago for evaluation.  Possibility of optic nerve edema was raised.  CT head was normal.  She followed up with ophthalmology who saw some mild scar tissue on the left eye but no acute or active findings.  Patient is having some chronic fatigue, sleep issues and stress issues.  REVIEW OF SYSTEMS: Full 14 system review of systems performed and negative with exception of: As per HPI.  ALLERGIES: No Known Allergies  HOME MEDICATIONS: Outpatient Medications Prior to Visit  Medication Sig Dispense Refill   acetaminophen (TYLENOL) 500 MG tablet Take 1,000 mg by mouth every 6 (six) hours as needed for mild pain (or headaches).     cyclobenzaprine (FLEXERIL) 5 MG tablet  Take 5 mg by mouth 3 (three) times daily as needed for muscle spasms.     ibuprofen (ADVIL) 200 MG tablet Take 200 mg by mouth every 6 (six) hours as needed.     ibuprofen (ADVIL) 800 MG tablet Take 800 mg by mouth every 6 (six) hours as needed for mild pain (or headaches).     naproxen sodium (ALEVE) 220 MG tablet Take 220 mg by mouth.     ondansetron (ZOFRAN-ODT) 4 MG disintegrating tablet Take 1 tablet (4 mg total) by mouth every 8 (eight) hours as needed for nausea or vomiting. (Patient taking differently: Take 4 mg by mouth every 8 (eight) hours as needed for nausea or vomiting (dissolve orally).) 21 tablet 0   naproxen sodium (ALEVE) 220 MG tablet Take 220 mg by mouth 2 (two) times daily as needed (for headaches or mild pain).     APPLE CIDER VINEGAR PO Take 2 capsules by mouth daily.     famotidine (PEPCID) 20 MG tablet Take 1 tablet (20 mg total) by mouth 2 (two) times daily. (Patient taking differently: Take 20 mg by mouth 2 (two) times daily as needed for heartburn or indigestion.) 60 tablet 3   ibuprofen (ADVIL) 600 MG tablet Take 1 tablet (600 mg total) by mouth every 6 (six) hours. (Patient not taking: No sig reported) 30 tablet 0   topiramate (TOPAMAX) 50 MG tablet TAKE 1 TABLET(50 MG) BY MOUTH DAILY (Patient not taking: Reported  on 06/04/2021) 90 tablet 3   No facility-administered medications prior to visit.    PAST MEDICAL HISTORY: Past Medical History:  Diagnosis Date   Allergy    Chlamydia    age 21yo; hx/o trichomonas age 63yo   Chronic headache    Depression    doing ok, declined meds   High cholesterol    Infection    UTI   Pregnancy induced hypertension    Trichomonas vaginalis infection    Vaginal Pap smear, abnormal    bx, cryo    PAST SURGICAL HISTORY: Past Surgical History:  Procedure Laterality Date   INDUCED ABORTION     TUBAL LIGATION Bilateral 11/15/2018   Procedure: POST PARTUM TUBAL LIGATION;  Surgeon: Hermina Staggers, MD;  Location: [facility];  Service: Gynecology;  Laterality: Bilateral;   WISDOM TOOTH EXTRACTION      FAMILY HISTORY: Family History  Problem Relation Age of Onset   Hypertension Mother    Diabetes Mother    Asthma Sister    Cancer Paternal Grandmother    Cancer Paternal Grandfather    Cancer Maternal Grandmother    Kidney disease Maternal Grandmother    Diabetes Maternal Grandmother    Heart disease Neg Hx    Stroke Neg Hx     SOCIAL HISTORY: Social History   Socioeconomic History   Marital status: Single    Spouse name: Not on file   Number of children: Not on file   Years of education: Not on file   Highest education level: Not on file  Occupational History   Occupation: student [employer]     Employer: [employer]   Occupation: unemployed  Tobacco Use   Smoking status: Former    Packs/day: 1.00    Years: 16.00    Pack years: 16.00    Types: Cigarettes    Start date: 10/13/2000   Smokeless tobacco: Never   Tobacco comments:    stopped August 2020  Vaping Use   Vaping Use: Never used  Substance and Sexual Activity   Alcohol use: Not Currently    Comment: not currently, socially   Drug use: Not Currently    Types: Marijuana    Comment: last was october 2020   Sexual activity: Yes    Birth control/protection: Surgical  Other Topics Concern   Not on file  Social History Narrative   Single, 2 children, exercise - none.  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 Wt Readings from Last 3  Encounters:  06/04/21 228 lb (103.4 kg)  05/23/21 231 lb 9.6 oz (105.1 kg)  05/17/21 232 lb 3.2 oz (105.3 kg)   GENERAL MALAISE APPEARANCE; well developed, nourished and groomed; neck is supple  CARDIOVASCULAR: Examination of carotid arteries is normal; no carotid bruits Regular rate and rhythm, no murmurs Examination of peripheral vascular system by observation and palpation is normal  EYES: Ophthalmoscopic exam of optic discs and posterior segments is  normal; no DEFINITE papilledema or hemorrhages No results found.  MUSCULOSKELETAL: Gait, strength, tone, movements noted in Neurologic exam below  NEUROLOGIC: MENTAL STATUS:  No flowsheet data found. awake, alert, oriented to person, place and time recent and remote memory intact normal attention and concentration language fluent, comprehension intact, naming intact fund of knowledge appropriate  CRANIAL NERVE:  2nd - no DEFINITE papilledema on fundoscopic exam; POSITIVE PHOTOPHOBIA 2nd, 3rd, 4th, 6th - pupils equal and reactive to light, visual fields full to confrontation, extraocular muscles intact, no nystagmus 5th - facial sensation symmetric 7th - facial strength symmetric 8th - hearing intact 9th - palate elevates symmetrically, uvula midline 11th - shoulder shrug symmetric 12th - tongue protrusion midline  MOTOR:  normal bulk and tone, full strength in the BUE, BLE  SENSORY:  normal and symmetric to light touch, pinprick, temperature, vibration  COORDINATION:  finger-nose-finger, fine finger movements normal  REFLEXES:  deep tendon reflexes present and symmetric  GAIT/STATION:  narrow based gait     DIAGNOSTIC DATA (LABS, IMAGING, TESTING) - I reviewed patient records, labs, notes, testing and imaging myself where available.  Lab Results  Component Value Date   WBC 6.1 05/22/2021   HGB 13.2 05/22/2021   HCT 41.4 05/22/2021   MCV 87.5 05/22/2021   PLT 247 05/22/2021      Component Value  Date/Time   NA 137 05/22/2021 1543   NA 139 04/16/2021 1024   K 3.8 05/22/2021 1543   CL 104 05/22/2021 1543   CO2 27 05/22/2021 1543   GLUCOSE 92 05/22/2021 1543   BUN 11 05/22/2021 1543   BUN 10 04/16/2021 1024   CREATININE 0.78 05/22/2021 1543   CREATININE 0.77 11/01/2013 1509   CALCIUM 8.9 05/22/2021 1543   PROT 6.8 04/16/2021 1024   ALBUMIN 4.1 04/16/2021 1024   AST 11 04/16/2021 1024   ALT 13 04/16/2021 1024   ALKPHOS 71 04/16/2021 1024   BILITOT <0.2 04/16/2021 1024   GFRNONAA >60 05/22/2021 1543   GFRAA >60 04/16/2020 1931   Lab Results  Component Value Date   CHOL 203 (H) 03/17/2012   HDL 47 03/17/2012   LDLCALC 145 (H) 03/17/2012   TRIG 54 03/17/2012   CHOLHDL 4.3 03/17/2012   Lab Results  Component Value Date   HGBA1C 5.4 10/10/2019   Lab Results  Component Value Date   VITAMINB12 617 05/23/2021   Lab Results  Component Value Date   TSH 2.170 05/23/2021    05/22/21 CT head [I reviewed images myself and agree with interpretation. -VRP]  - No acute intracranial findings.   ASSESSMENT AND PLAN  30 y.o. year old female here with headaches since middle school with migraine features.  Also with increasing headaches recently associated with transient visual loss and some optic nerve abnormalities.  We will proceed with further work-up with MRI of the brain and may consider LP to rule out pseudotumor cerebri.  Also will start migraine treatments.   Meds tried: topiramate (side effects), ibuprofen, tylenol  Dx:  1. Migraine with aura and without status migrainosus, not intractable   2. Transient visual loss of both eyes   3. New onset headache   4. Other headache syndrome       PLAN:  NEW ONSET HEADACHE, VISION LOSS - possible mild papilledema; eval and rule out IIH - check MRI brain; consider LP    MIGRAINE PREVENTION  LIFESTYLE CHANGES -Stop or avoid smoking -Decrease or avoid caffeine / alcohol -Eat and sleep on a regular  schedule -Exercise several times per week - start propranolol 40mg  daily; after 1-2 weeks increase to 40mg  twice a day  Consider 2nd line - erenumab (Aimovig) 70mg  monthly (may increase to 140mg  monthly) - fremanezumab (Ajovy) 225mg  monthly (or 675mg  every 3 months) - galazanezumab (Emgality) 240mg  loading dose; then 120mg  monthly   MIGRAINE RESCUE   - ibuprofen, tylenol as needed - rizatriptan (Maxalt) 10mg  as needed for breakthrough headache; may repeat x 1 after 2 hours; max 2 tabs per day or 8 per month - consider rimegepant (Nurtec) 75mg  as needed for breakthrough headache; max 8 per month  Orders Placed This Encounter  Procedures   MR BRAIN W WO CONTRAST   Meds ordered this encounter  Medications   propranolol (INDERAL) 40 MG tablet    Sig: Take 1 tablet (40 mg total) by mouth 2 (two) times daily.    Dispense:  60 tablet    Refill:  6   rizatriptan (MAXALT-MLT) 10 MG disintegrating tablet    Sig: Take 1 tablet (10 mg total) by mouth as needed for migraine. May repeat in 2 hours if needed    Dispense:  9 tablet    Refill:  11   Return in about 6 months (around 12/05/2021).    , MD 06/04/2021, 10:43 AM Certified in Neurology, Neurophysiology and Neuroimaging  [facility] [address] [place],  [phone]

## 2021-06-04 NOTE — Patient Instructions (Addendum)
  NEW ONSET HEADACHE, VISION LOSS - check MRI brain; consider LP    MIGRAINE PREVENTION  LIFESTYLE CHANGES -Stop or avoid smoking -Decrease or avoid caffeine / alcohol -Eat and sleep on a regular schedule -Exercise several times per week - start propranolol 40mg  daily; after 1-2 weeks increase to 40mg  twice a day   MIGRAINE RESCUE  - ibuprofen, tylenol as needed - rizatriptan (Maxalt) 10mg  as needed for breakthrough headache; may repeat x 1 after 2 hours; max 2 tabs per day or 8 per month

## 2021-06-05 ENCOUNTER — Encounter: Payer: Self-pay | Admitting: Family Medicine

## 2021-06-05 ENCOUNTER — Ambulatory Visit (INDEPENDENT_AMBULATORY_CARE_PROVIDER_SITE_OTHER): Payer: Medicaid Other | Admitting: Family Medicine

## 2021-06-05 ENCOUNTER — Other Ambulatory Visit: Payer: Self-pay

## 2021-06-05 VITALS — BP 114/74 | HR 85 | Ht 59.0 in | Wt 230.0 lb

## 2021-06-05 DIAGNOSIS — G43109 Migraine with aura, not intractable, without status migrainosus: Secondary | ICD-10-CM | POA: Diagnosis present

## 2021-06-05 NOTE — Patient Instructions (Signed)
It was great seeing you today!  I am glad that you did not have a migraine yesterday.  Please start taking the medication prescribed by your neurologist and be sure to follow-up with them for further care regarding your migraines.  Follow-up as you see necessary.  If you have any questions or concerns call the clinic.  I hope you have a wonderful day!

## 2021-06-05 NOTE — Assessment & Plan Note (Signed)
Patient continues to have migraines every day to every other day.  Recently saw neurology and they are going to continue to work-up the cause of these migraines.  Medications were prescribed at this visit but she has not yet started them.  They are planning on imaging and possible LP.  Patient will continue to follow with neurology for migraine management and follow-up with [name] as needed.  Strict return precautions given.  No further questions or concerns.

## 2021-06-05 NOTE — Progress Notes (Signed)
    SUBJECTIVE:   CHIEF COMPLAINT / HPI:   Migraine follow up  Patient reports that she has been doing well since the previous visit.  She saw neurology yesterday and they are doing further investigation into the cause of her migraines including an MRI.  They started her on 2 medications which she picked up from the pharmacy last night and is going to start today.  She has no other concerns at this time.  OBJECTIVE:   BP 114/74   Pulse 85   Ht 4\' 11"  (1.499 m)   Wt 230 lb (104.3 kg)   LMP 05/01/2021 (Approximate)   SpO2 93%   BMI 46.45 kg/m   General: Tired appearing 30 year old female, no acute distress HEENT: Pupils equal and reactive to light, extraocular eye movement intact Cardiac: Regular rate and rhythm, no murmurs appreciated Respiratory: Normal work of breathing, lungs clear to auscultation bilaterally Neuro: No cranial nerve deficit  ASSESSMENT/PLAN:   Migraine with aura and without status migrainosus, not intractable Patient continues to have migraines every day to every other day.  Recently saw neurology and they are going to continue to work-up the cause of these migraines.  Medications were prescribed at this visit but she has not yet started them.  They are planning on imaging and possible LP.  Patient will continue to follow with neurology for migraine management and follow-up with 34 as needed.  Strict return precautions given.  No further questions or concerns.     [name], MD [facility]

## 2021-06-19 ENCOUNTER — Ambulatory Visit: Payer: Medicaid Other | Admitting: Family Medicine

## 2021-07-05 ENCOUNTER — Ambulatory Visit (INDEPENDENT_AMBULATORY_CARE_PROVIDER_SITE_OTHER): Payer: Medicaid Other | Admitting: Family Medicine

## 2021-07-05 ENCOUNTER — Encounter: Payer: Self-pay | Admitting: Family Medicine

## 2021-07-05 ENCOUNTER — Other Ambulatory Visit: Payer: Self-pay

## 2021-07-05 VITALS — BP 125/71 | HR 99 | Ht 59.0 in | Wt 233.0 lb

## 2021-07-05 DIAGNOSIS — G43109 Migraine with aura, not intractable, without status migrainosus: Secondary | ICD-10-CM

## 2021-07-05 DIAGNOSIS — R350 Frequency of micturition: Secondary | ICD-10-CM | POA: Diagnosis present

## 2021-07-05 LAB — POCT UA - MICROSCOPIC ONLY

## 2021-07-05 LAB — POCT URINALYSIS DIP (MANUAL ENTRY)
Bilirubin, UA: NEGATIVE
Glucose, UA: NEGATIVE mg/dL
Ketones, POC UA: NEGATIVE mg/dL
Leukocytes, UA: NEGATIVE
Nitrite, UA: NEGATIVE
Protein Ur, POC: NEGATIVE mg/dL
Spec Grav, UA: 1.015 (ref 1.010–1.025)
Urobilinogen, UA: 0.2 E.U./dL
pH, UA: 5.5 (ref 5.0–8.0)

## 2021-07-05 NOTE — Patient Instructions (Signed)
It was great seeing you today.  Regarding your UTI her symptoms I checked your urine and it does not look like you have an infection but I am going to send her for culture.  I want you to drink plenty of fluids and be sure to use the bathroom regularly and see if your symptoms improve.  If they do not we will need to do a wet prep exam to check for vaginal infections which could be causing the symptoms.  I will call you with the culture results if anything comes back positive.  If your symptoms persist please let me know.  We are going to schedule you for a IUD insertion.  If you have any questions or concerns call the clinic.  I hope you have a wonderful afternoon!

## 2021-07-05 NOTE — Progress Notes (Signed)
    SUBJECTIVE:   CHIEF COMPLAINT / HPI:   Migraine follow-up Patient reports that her migraines did improve from the medications that neurology prescribed.  She reports she felt really tired with the propranolol so she stopped taking it.  Is going to restart.  Has follow-up appointment with neurology in a few months.  Feels like they are managing her migraines well.  Concern for uti  Patient reports that over the last 5 days or so she has had feelings of bladder fullness, itching/burning with urination.  Denies any vaginal symptoms such as vaginal discharge, odor.  Reports that she recently became sexually active but used protection.  She reports that in the past she got multiple UTIs when she became sexually active.  Is concerned that she may have another UTI. OBJECTIVE:   BP 125/71   Pulse 99   Ht 4\' 11"  (1.499 m)   Wt 233 lb (105.7 kg)   LMP 06/07/2021   SpO2 99%   BMI 47.06 kg/m   General: Well-appearing 30 year old female, no acute distress Cardiac: Regular rate and rhythm, no murmurs appreciated Respiratory: Normal work of breathing, lungs clear to auscultation bilaterally Abdomen: Soft, nontender, positive bowel sounds, no costovertebral angle tenderness  ASSESSMENT/PLAN:   Urinary frequency Patient complaining of increased frequency as well as itching/burning with urination.  She is concerned about having UTI and reports multiple UTIs in the past.  Urinalysis negative for acute UTI although trace lysed red blood cells are noted.  Will send for urine culture and not treat with antibiotics at this time.  If symptoms persist patient may need pelvic exam but she declined it at this visit.  Migraine with aura and without status migrainosus, not intractable Patient continues to follow with neurology.  No further management by me at this time.     34, MD [facility]

## 2021-07-06 NOTE — Assessment & Plan Note (Signed)
Patient complaining of increased frequency as well as itching/burning with urination.  She is concerned about having UTI and reports multiple UTIs in the past.  Urinalysis negative for acute UTI although trace lysed red blood cells are noted.  Will send for urine culture and not treat with antibiotics at this time.  If symptoms persist patient may need pelvic exam but she declined it at this visit.

## 2021-07-06 NOTE — Assessment & Plan Note (Signed)
Patient continues to follow with neurology.  No further management by me at this time.

## 2021-07-10 LAB — URINE CULTURE

## 2021-07-11 ENCOUNTER — Other Ambulatory Visit: Payer: Self-pay

## 2021-07-11 MED ORDER — ONDANSETRON 4 MG PO TBDP: 4.0000 mg | ORAL_TABLET | Freq: Three times a day (TID) | ORAL | 0 refills | Status: DC | PRN

## 2021-07-24 ENCOUNTER — Encounter (HOSPITAL_COMMUNITY): Payer: Self-pay | Admitting: Emergency Medicine

## 2021-07-24 ENCOUNTER — Emergency Department (HOSPITAL_COMMUNITY)
Admission: EM | Admit: 2021-07-24 | Discharge: 2021-07-24 | Disposition: A | Discharge status: Home / Self Care | Payer: Medicaid Other | Attending: Family Medicine | Admitting: Family Medicine

## 2021-07-24 ENCOUNTER — Emergency Department (HOSPITAL_COMMUNITY)
Admit: 2021-07-24 | Discharge: 2021-07-24 | Disposition: A | Discharge status: Home / Self Care | Payer: Medicaid Other | Attending: Family Medicine | Admitting: Family Medicine

## 2021-07-24 ENCOUNTER — Other Ambulatory Visit: Payer: Self-pay

## 2021-07-24 ENCOUNTER — Emergency Department (HOSPITAL_COMMUNITY): Payer: Medicaid Other

## 2021-07-24 ENCOUNTER — Ambulatory Visit (INDEPENDENT_AMBULATORY_CARE_PROVIDER_SITE_OTHER): Payer: Medicaid Other | Admitting: Family Medicine

## 2021-07-24 VITALS — BP 136/98 | HR 93 | Temp 98.0°F | Wt 233.8 lb

## 2021-07-24 DIAGNOSIS — R4182 Altered mental status, unspecified: Secondary | ICD-10-CM | POA: Insufficient documentation

## 2021-07-24 DIAGNOSIS — M7918 Myalgia, other site: Secondary | ICD-10-CM | POA: Diagnosis present

## 2021-07-24 DIAGNOSIS — Z87891 Personal history of nicotine dependence: Secondary | ICD-10-CM | POA: Diagnosis not present

## 2021-07-24 DIAGNOSIS — N9489 Other specified conditions associated with female genital organs and menstrual cycle: Secondary | ICD-10-CM | POA: Insufficient documentation

## 2021-07-24 DIAGNOSIS — R55 Syncope and collapse: Secondary | ICD-10-CM | POA: Diagnosis not present

## 2021-07-24 DIAGNOSIS — R251 Tremor, unspecified: Secondary | ICD-10-CM | POA: Diagnosis not present

## 2021-07-24 DIAGNOSIS — R52 Pain, unspecified: Secondary | ICD-10-CM

## 2021-07-24 LAB — I-STAT BETA HCG BLOOD, ED (MC, WL, AP ONLY): I-stat hCG, quantitative: <5 m[IU]/mL (ref <5)

## 2021-07-24 LAB — CBC WITH DIFFERENTIAL/PLATELET
Abs Immature Granulocytes: 0.03 10*3/uL (ref 0.00–0.07)
Basophils Absolute: 0 10*3/uL (ref 0.0–0.1)
Basophils Relative: 0 %
Eosinophils Absolute: 0.1 10*3/uL (ref 0.0–0.5)
Eosinophils Relative: 1 %
HCT: 39.5 % (ref 36.0–46.0)
Hemoglobin: 12.7 g/dL (ref 12.0–15.0)
Immature Granulocytes: 0 %
Lymphocytes Relative: 18 %
Lymphs Abs: 1.7 10*3/uL (ref 0.7–4.0)
MCH: 27.5 pg (ref 26.0–34.0)
MCHC: 32.2 g/dL (ref 30.0–36.0)
MCV: 85.7 fL (ref 80.0–100.0)
Monocytes Absolute: 0.6 10*3/uL (ref 0.1–1.0)
Monocytes Relative: 6 %
Neutro Abs: 6.8 10*3/uL (ref 1.7–7.7)
Neutrophils Relative %: 75 %
Platelets: 249 10*3/uL (ref 150–400)
RBC: 4.61 MIL/uL (ref 3.87–5.11)
RDW: 13.7 % (ref 11.5–15.5)
WBC: 9.3 10*3/uL (ref 4.0–10.5)
nRBC: 0 % (ref 0.0–0.2)

## 2021-07-24 LAB — URINALYSIS, ROUTINE W REFLEX MICROSCOPIC
Bilirubin Urine: NEGATIVE
Glucose, UA: NEGATIVE mg/dL
Ketones, ur: NEGATIVE mg/dL
Leukocytes,Ua: NEGATIVE
Nitrite: NEGATIVE
Protein, ur: NEGATIVE mg/dL
Specific Gravity, Urine: 1.008 (ref 1.005–1.030)
pH: 5 (ref 5.0–8.0)

## 2021-07-24 LAB — COMPREHENSIVE METABOLIC PANEL
ALT: 18 U/L (ref 0–44)
AST: 19 U/L (ref 15–41)
Albumin: 4.1 g/dL (ref 3.5–5.0)
Alkaline Phosphatase: 58 U/L (ref 38–126)
Anion gap: 10 (ref 5–15)
BUN: 8 mg/dL (ref 6–20)
CO2: 25 mmol/L (ref 22–32)
Calcium: 9.1 mg/dL (ref 8.9–10.3)
Chloride: 102 mmol/L (ref 98–111)
Creatinine, Ser: 0.86 mg/dL (ref 0.44–1.00)
GFR, Estimated: >60 mL/min (ref >60)
Glucose, Bld: 103 mg/dL — ABNORMAL HIGH (ref 70–99)
Potassium: 3.8 mmol/L (ref 3.5–5.1)
Sodium: 137 mmol/L (ref 135–145)
Total Bilirubin: 0.6 mg/dL (ref 0.3–1.2)
Total Protein: 7.5 g/dL (ref 6.5–8.1)

## 2021-07-24 LAB — GLUCOSE, POCT (MANUAL RESULT ENTRY): POC Glucose: 107 mg/dl — AB (ref 70–99)

## 2021-07-24 NOTE — Discharge Instructions (Signed)
Your work-up is reassuring today.  Your head CT was without abnormalities.  Your blood work did not show any electrolyte abnormality that would be causing your symptoms.  As we discussed it is likely that your children gave you an illness.  You may treat this with over-the-counter remedies.  Please return to the emergency department if you have trouble breathing, chest pain, inability to walk or any worsening of your symptoms.  It was a pleasure to meet you and I hope that you feel better.

## 2021-07-24 NOTE — Assessment & Plan Note (Signed)
Patient presents with near syncope in the waiting room.  She reports she has numbness and tingling in her hands and is having left-sided body spasms.  Hemodynamically stable.  EKG was within normal limits in the clinic.  Unclear etiology of the spasms.  Patient is also lethargic on exam.  Due to unclear etiology of symptoms and inability to do rapid lab work recommended patient be evaluated in the emergency department.  She was rolled over by our nursing staff.  We will follow-up after she is evaluated.

## 2021-07-24 NOTE — Progress Notes (Signed)
    SUBJECTIVE:   CHIEF COMPLAINT / HPI:   Near syncope  Patient presented to our office today for a IUD placement.  She was in the waiting room when she had sudden onset right-sided back pain as well as numbness and tingling in her left hand and spasms along the right side of her body.  CBG was checked and was normal.  EKG was obtained and was sinus rhythm without any abnormalities.  Patient continued to have left-sided body spasms and was extremely lethargic throughout the entire encounter.  Patient reports that she was at work all night and that she last ate around 3 PM yesterday.  Denies any shortness of breath but reports heart palpitations and back pain, runny nose that started this morning.  Denies any nausea or vomiting, diarrhea or constipation, fever.  She took a break around 3 AM and had a couple coffee, energy drink, and some water.  Has been taking her medications as prescribed.  OBJECTIVE:   BP (!) 136/98   Pulse 93   Temp 98 F (36.7 C)   Wt 233 lb 12.8 oz (106.1 kg)   SpO2 99%   BMI 47.22 kg/m   General: Lethargic, eyes barely open, able to respond appropriately to questions. Respiratory: Normal work of breathing, lungs clear to auscultation bilaterally Cardio: Regular rate and rhythm, no murmurs appreciated, EKG normal sinus rhythm Abdomen: Soft, nontender, positive bowel sounds MSK: No gross abnormalities Neuro: Patient is intermittently lethargic throughout the evaluation, while laying flat she will be rubbing her left hand and then have left-sided arm and leg spasms.  She remains conscious throughout ASSESSMENT/PLAN:   Near syncope Patient presents with near syncope in the waiting room.  She reports she has numbness and tingling in her hands and is having left-sided body spasms.  Hemodynamically stable.  EKG was within normal limits in the clinic.  Unclear etiology of the spasms.  Patient is also lethargic on exam.  Due to unclear etiology of symptoms and inability to  do rapid lab work recommended patient be evaluated in the emergency department.  She was rolled over by our nursing staff.  We will follow-up after she is evaluated.     Derrel Nip, MD [facility]

## 2021-07-24 NOTE — Progress Notes (Signed)
RN made aware of patient in waiting room that was experiencing weakness and shakiness. Patient placed in wheelchair and brought back to room. Patient reports that onset of symptoms have been sudden and she also has sore throat and nasal congestion.   Vitals obtained. See vital sign flow sheet.   Provider made aware and gave verbal order for CBG.   Veronda Prude, RN

## 2021-07-24 NOTE — ED Provider Notes (Signed)
Emergency Medicine Provider Triage Evaluation Note  Katelyn Mcfarland , a 30 y.o. female  was evaluated in triage.  Pt complains of "shakiness all over".  Started this morning, seen by her primary care office who sent her to the ED for additional evaluation.  States she started having heart palpitations, denies any chest pain or unilateral weakness.    Review of Systems  Positive: Palpitations, shakiness all over Negative: Chest pain, shortness of breath, unilateral weakness, headache, nausea, vomiting  Physical Exam  BP (!) 144/94   Pulse 89   Temp 98.8 F (37.1 C)   Resp 16   SpO2 100%  Gen:   Awake, no distress.  Patient tired on exam. Resp:  Normal effort  MSK:   Moves extremities without difficulty  Other:  Cranial nerves III through XII grossly intact.  Grip strength is equal bilaterally.  Able to raise both legs.  Medical Decision Making  Medically screening exam initiated at 12:19 PM.  Appropriate orders placed.  Katelyn Mcfarland was informed that the remainder of the evaluation will be completed by another provider, this initial triage assessment does not replace that evaluation, and the importance of remaining in the ED until their evaluation is complete.  Will check labs, repeat EKG.  CT head given altered onset of symptoms and generalized weakness.   Theron Arista, PA-C 07/24/21 1221    Jacalyn Lefevre, MD 07/24/21 1335

## 2021-07-24 NOTE — Patient Instructions (Signed)
I am so sorry this happened today.  We checked your blood sugar as well as got an EKG of your heart and those both came back normal.  I have no clear reason why you are having these muscle spasms, numbness in your hand.  We are going to take you over to the emergency department for further evaluation.  I have called them to let them know you are coming.  We will keep an eye on your chart if you are discharged today we will call you and may be able to schedule the IUD placement for Friday morning.  I hope you get to feeling better.

## 2021-07-24 NOTE — ED Triage Notes (Signed)
Pt endorses shaking and spasms all over since this morning. Pt from PCP with normal EKG and sent for labs.

## 2021-07-24 NOTE — ED Provider Notes (Signed)
[facility] EMERGENCY DEPARTMENT Provider Note   CSN: [id number] Arrival date & time: 07/24/21  1109     History Chief Complaint  Patient presents with   Spasms    Katelyn Mcfarland is a 30 y.o. female with a past medical history of GERD, chronic back pain and recurrent chest discomfort presenting today with a complaint of body aches.  Patient reports that this morning she woke up to go to her primary care appointment and felt as though she had cramps all over her body.  Went to see her primary care to get an IUD put in it and they told her she did not look well and sent her to the emergency department.  Both of her children are sick with adenovirus at this time.  She denies fever, chills, chest pain or shortness of breath.  Took an at home COVID test that was negative last night.  Endorsing a sore throat.  Feels as though her children got her sick but wanted to come get checked out.  HPI     Past Medical History:  Diagnosis Date   Allergy    Chlamydia    age 89yo; hx/o trichomonas age 69yo   Chronic headache    Depression    doing ok, declined meds   High cholesterol    Infection    UTI   Pregnancy induced hypertension    Trichomonas vaginalis infection    Vaginal Pap smear, abnormal    bx, cryo    Patient Active Problem List   Diagnosis Date Noted   Visual changes 05/23/2021   Migraine with aura and without status migrainosus, not intractable 04/16/2021   Gastroesophageal reflux disease 04/16/2021   Chest discomfort 12/29/2020   Dysuria 10/25/2020   Need for immunization against influenza 10/25/2020   Urinary frequency 10/25/2020   Chronic back pain 08/22/2020   Bipolar 1 disorder (HCC) 08/20/2020   PPD screening test 06/20/2020   Annual physical exam 06/20/2020   Genetic carrier status 10/25/2019   History of gestational hypertension 10/10/2019   Failed Tubal ligation 08/28/2019   Chronic vomiting 06/07/2019   Depression 06/07/2019   Sickle  cell trait (HCC) 05/01/2018   BMI 35-39 05/01/2018   History of tobacco abuse 01/11/2014    Past Surgical History:  Procedure Laterality Date   INDUCED ABORTION     TUBAL LIGATION Bilateral 11/15/2018   Procedure: POST PARTUM TUBAL LIGATION;  Surgeon: Hermina Staggers, MD;  [facility]: [facility];  Service: Gynecology;  Laterality: Bilateral;   WISDOM TOOTH EXTRACTION       OB History     Gravida  7   Para  4   Term  4   Preterm  0   AB  3   Living  4      SAB  2   IAB  1   Ectopic  0   Multiple  0   Live Births  4           Family History  Problem Relation Age of Onset   Hypertension Mother    Diabetes Mother    Asthma Sister    Cancer Paternal Grandmother    Cancer Paternal Grandfather    Cancer Maternal Grandmother    Kidney disease Maternal Grandmother    Diabetes Maternal Grandmother    Heart disease Neg Hx    Stroke Neg Hx     Social History   Tobacco Use   Smoking status: Former  Packs/day: 1.00    Years: 16.00    Pack years: 16.00    Types: Cigarettes    Start date: 10/13/2000   Smokeless tobacco: Never   Tobacco comments:    stopped August 2020  Vaping Use   Vaping Use: Never used  Substance Use Topics   Alcohol use: Not Currently    Comment: not currently, socially   Drug use: Not Currently    Types: Marijuana    Comment: last was october 2020    Home Medications Prior to Admission medications   Medication Sig Start Date End Date Taking? Authorizing Provider  acetaminophen (TYLENOL) 500 MG tablet Take 1,000 mg by mouth every 6 (six) hours as needed for mild pain (or headaches).   Yes [provider]  ibuprofen (ADVIL) 800 MG tablet Take 800 mg by mouth every 6 (six) hours as needed for mild pain (or headaches).   Yes [provider]  ondansetron (ZOFRAN-ODT) 4 MG disintegrating tablet Take 1 tablet (4 mg total) by mouth every 8 (eight) hours as needed for nausea or vomiting. 07/11/21  Yes Derrel Nip, MD  rizatriptan (MAXALT-MLT) 10 MG disintegrating tablet Take 1 tablet (10 mg total) by mouth as needed for migraine. May repeat in 2 hours if needed 06/04/21  Yes Penumalli, Glenford Bayley, MD  ibuprofen (ADVIL) 200 MG tablet Take 200 mg by mouth every 6 (six) hours as needed.    [provider]  propranolol (INDERAL) 40 MG tablet Take 1 tablet (40 mg total) by mouth 2 (two) times daily. Patient not taking: No sig reported 06/04/21   Penumalli, Glenford Bayley, MD    Allergies    Patient has no known allergies.  Review of Systems   Review of Systems  Constitutional:  Negative for chills and fever.  HENT:  Positive for sore throat.   Respiratory:  Negative for cough and shortness of breath.   Cardiovascular:  Negative for chest pain and palpitations.  Gastrointestinal:  Negative for nausea and vomiting.  Musculoskeletal:  Positive for myalgias. Negative for gait problem.  Neurological:  Positive for dizziness and weakness. Negative for syncope, numbness and headaches.  All other systems reviewed and are negative.  Physical Exam Updated Vital Signs BP (!) 144/94   Pulse 89   Temp 98.8 F (37.1 C)   Resp 16   SpO2 100%   Physical Exam Vitals and nursing note reviewed.  Constitutional:      General: She is not in acute distress.    Appearance: Normal appearance.  HENT:     Head: Normocephalic and atraumatic.     Mouth/Throat:     Mouth: Mucous membranes are moist.     Pharynx: Oropharynx is clear.  Eyes:     General: No scleral icterus.    Conjunctiva/sclera: Conjunctivae normal.  Cardiovascular:     Rate and Rhythm: Normal rate and regular rhythm.  Pulmonary:     Effort: Pulmonary effort is normal. No respiratory distress.     Breath sounds: Normal breath sounds. No wheezing or rales.  Abdominal:     General: Abdomen is flat.     Palpations: Abdomen is soft.     Tenderness: There is no abdominal tenderness.  Musculoskeletal:        General: No deformity.      Right lower leg: No edema.     Left lower leg: No edema.  Skin:    General: Skin is warm and dry.     Findings: No rash.  Neurological:     Mental Status: She is alert.  Psychiatric:        Mood and Affect: Mood normal.        Behavior: Behavior normal.    ED Results / Procedures / Treatments   Labs (all labs ordered are listed, but only abnormal results are displayed) Labs Reviewed  COMPREHENSIVE METABOLIC PANEL - Abnormal; Notable for the following components:      Result Value   Glucose, Bld 103 (*)    All other components within normal limits  URINALYSIS, ROUTINE W REFLEX MICROSCOPIC - Abnormal; Notable for the following components:   Color, Urine STRAW (*)    Hgb urine dipstick SMALL (*)    Bacteria, UA FEW (*)    All other components within normal limits  CBC WITH DIFFERENTIAL/PLATELET  I-STAT BETA HCG BLOOD, ED ([facility], [facility], [facility] ONLY)    EKG None  Radiology CT Head Wo Contrast  Result Date: 07/24/2021 CLINICAL DATA:  Altered mental status EXAM: CT HEAD WITHOUT CONTRAST TECHNIQUE: Contiguous axial images were obtained from the base of the skull through the vertex without intravenous contrast. COMPARISON:  CT head 05/22/2021 FINDINGS: Brain: There is no acute intracranial hemorrhage, extra-axial fluid collection, or acute infarct. Parenchymal volume is normal. The ventricles are normal in size. There is no mass lesion. There is no midline shift. Vascular: No hyperdense vessel or unexpected calcification. Skull: Normal. Negative for fracture or focal lesion. Sinuses/Orbits: The imaged paranasal sinuses are clear. The globes and orbits are unremarkable. Other: None. IMPRESSION: No acute intracranial pathology. Electronically Signed   By: Lesia Hausen M.D.   On: 07/24/2021 14:29    Procedures Procedures   Medications Ordered in ED Medications - No data to display  ED Course  I have reviewed the triage vital signs and the nursing notes.  Pertinent labs & imaging results that  were available during my care of the patient were reviewed by me and considered in my medical decision making (see chart for details).    MDM Rules/Calculators/A&P  I evaluated the patient at bedside in the hallway.  She was in no acute distress and told me she felt as though her children got her sick.  We discussed her results she felt as though she wanted to go home.  Stating that she has not eaten all day and we will treat her illness with over-the-counter remedies.  I believe she is stable to do this.  CT head within normal limits.  She denied the need for COVID and flu testing today.  Reports she will treat herself at home.  Final Clinical Impression(s) / ED Diagnoses Final diagnoses:  Generalized body aches    Rx / DC Orders Results and diagnoses were explained to the patient. Return precautions discussed in full. Patient had no additional questions and expressed complete understanding.     Woodroe Chen 07/24/21 1607    Maia Plan, MD 07/31/21 [phone]

## 2021-07-25 ENCOUNTER — Ambulatory Visit: Payer: Medicaid Other | Admitting: Family Medicine

## 2021-07-26 ENCOUNTER — Ambulatory Visit: Payer: Medicaid Other | Admitting: Family Medicine

## 2021-07-31 ENCOUNTER — Encounter: Payer: Medicaid Other | Admitting: Family Medicine

## 2021-08-30 ENCOUNTER — Encounter: Payer: Medicaid Other | Admitting: Family Medicine

## 2021-09-30 ENCOUNTER — Encounter: Payer: Medicaid Other | Admitting: Family Medicine

## 2021-10-11 ENCOUNTER — Telehealth: Payer: Self-pay | Admitting: Family Medicine

## 2021-10-11 ENCOUNTER — Other Ambulatory Visit: Payer: Self-pay | Admitting: Family Medicine

## 2021-10-11 NOTE — Telephone Encounter (Signed)
Patient states pharmacy advised her to see her Dr to get a refill on her nausea meds. Out of meds completely.

## 2021-10-11 NOTE — Telephone Encounter (Signed)
Clinical info completed on Pre-Employment Physical  form.  Place form in [employer] box for completion.  Mckenzey Parcell Zimmerman Rumple, CMA

## 2021-10-11 NOTE — Telephone Encounter (Signed)
Patient dropped off pre-employment physical form to be completed. Last DOS was 07/24/21. Placed in [employer].

## 2021-10-14 MED ORDER — ONDANSETRON 4 MG PO TBDP: 4.0000 mg | ORAL_TABLET | Freq: Three times a day (TID) | ORAL | 0 refills | Status: DC | PRN

## 2021-10-15 ENCOUNTER — Ambulatory Visit: Payer: Self-pay | Admitting: Gastroenterology

## 2021-10-15 NOTE — Progress Notes (Deleted)
[facility] Consult Note:  History: Katelyn Mcfarland 10/15/2021  Referring provider: Derrel Nip, MD  Reason for consult/chief complaint: No chief complaint on file.   Subjective  HPI: Seen October 2020 for about 2 years of intermittent generalized abdominal pain, nausea and vomiting, alternating constipation diarrhea and rectal bleeding.  EGD normal except grade a esophagitis, biopsies negative for H. pylori.  Colonoscopy to terminal ileum normal, no hemorrhoids seen. ***   ROS:  Review of Systems   Past Medical History: Past Medical History:  Diagnosis Date   Allergy    Chlamydia    age 31yo; hx/o trichomonas age 2yo   Chronic headache    Depression    doing ok, declined meds   High cholesterol    Infection    UTI   Pregnancy induced hypertension    Trichomonas vaginalis infection    Vaginal Pap smear, abnormal    bx, cryo     Past Surgical History: Past Surgical History:  Procedure Laterality Date   INDUCED ABORTION     TUBAL LIGATION Bilateral 11/15/2018   Procedure: POST PARTUM TUBAL LIGATION;  Surgeon: Hermina Staggers, MD;  Location: [facility];  Service: Gynecology;  Laterality: Bilateral;   WISDOM TOOTH EXTRACTION       Family History: Family History  Problem Relation Age of Onset   Hypertension Mother    Diabetes Mother    Asthma Sister    Cancer Paternal Grandmother    Cancer Paternal Grandfather    Cancer Maternal Grandmother    Kidney disease Maternal Grandmother    Diabetes Maternal Grandmother    Heart disease Neg Hx    Stroke Neg Hx     Social History: Social History   Socioeconomic History   Marital status: Single    Spouse name: Not on file   Number of children: Not on file   Years of education: Not on file   Highest education level: Not on file  Occupational History   Occupation: student [employer]     Employer: [employer]   Occupation: unemployed  Tobacco Use   Smoking status: Former     Packs/day: 1.00    Years: 16.00    Pack years: 16.00    Types: Cigarettes    Start date: 10/13/2000   Smokeless tobacco: Never   Tobacco comments:    stopped August 2020  Vaping Use   Vaping Use: Never used  Substance and Sexual Activity   Alcohol use: Not Currently    Comment: not currently, socially   Drug use: Not Currently    Types: Marijuana    Comment: last was october 2020   Sexual activity: Yes    Birth control/protection: Surgical  Other Topics Concern   Not on file  Social History Narrative   Single, 2 children, exercise - none.  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     ibuprofen (ADVIL) 200 MG tablet Take 200 mg by mouth every 6 (six) hours as  needed.     ibuprofen (ADVIL) 800 MG tablet Take 800 mg by mouth every 6 (six) hours as needed for mild pain (or headaches).     ondansetron (ZOFRAN-ODT) 4 MG disintegrating tablet Take 1 tablet (4 mg total) by mouth every 8 (eight) hours as needed for nausea or vomiting. 21 tablet 0   propranolol (INDERAL) 40 MG tablet Take 1 tablet (40 mg total) by mouth 2 (two) times daily. (Patient not taking: No sig reported) 60 tablet 6   rizatriptan (MAXALT-MLT) 10 MG disintegrating tablet Take 1 tablet (10 mg total) by mouth as needed for migraine. May repeat in 2 hours if needed 9 tablet 11   No current facility-administered medications for this visit.      ___________________________________________________________________ Objective    Exam:  There were no vitals taken for this visit. Wt Readings from Last 3 Encounters:  07/24/21 233 lb 12.8 oz (106.1 kg)  07/05/21 233 lb (105.7 kg)  06/05/21 230 lb (104.3 kg)    General: ***  Eyes: sclera anicteric, no redness ENT: oral mucosa moist without lesions, no cervical or supraclavicular lymphadenopathy CV: RRR without murmur, S1/S2, no JVD, no peripheral edema Resp: clear to auscultation bilaterally, normal RR and effort noted GI: soft, *** tenderness, with active bowel sounds. No guarding or palpable organomegaly noted. Skin; warm and dry, no rash or jaundice noted Neuro: awake, alert and oriented x 3. Normal gross motor function and fluent speech  Labs:  ***  Radiologic Studies:  ***  Assessment: No diagnosis found.  ***  Plan:  ***  Thank you for the courtesy of this consult.  Please call me with any questions or concerns.  Nelida Meuse III  CC: Referring provider noted above

## 2021-10-16 ENCOUNTER — Ambulatory Visit: Payer: Medicaid Other

## 2021-10-17 ENCOUNTER — Other Ambulatory Visit: Payer: Self-pay

## 2021-10-17 ENCOUNTER — Ambulatory Visit (INDEPENDENT_AMBULATORY_CARE_PROVIDER_SITE_OTHER): Payer: Medicaid Other | Admitting: Family Medicine

## 2021-10-17 DIAGNOSIS — Z23 Encounter for immunization: Secondary | ICD-10-CM | POA: Diagnosis not present

## 2021-10-17 LAB — POCT GLYCOSYLATED HEMOGLOBIN (HGB A1C): Hemoglobin A1C: 5.6 % (ref 4.0–5.6)

## 2021-10-17 NOTE — Patient Instructions (Signed)
It was great seeing you today!  I have no major concerns.  I filled out your work physical.  I do want to check your cholesterol as well as check your hemoglobin A1c which is a test for diabetes.  If you have any questions or concerns please call the clinic.  We are scheduling you for an appointment for an IUD placement and can check for STDs at that time.  I hope you have a wonderful afternoon!
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
## 2021-10-18 LAB — LIPID PANEL
Chol/HDL Ratio: 4.6 ratio — ABNORMAL HIGH (ref 0.0–4.4)
Cholesterol, Total: 204 mg/dL — ABNORMAL HIGH (ref 100–199)
HDL: 44 mg/dL (ref >39)
LDL Chol Calc (NIH): 141 mg/dL — ABNORMAL HIGH (ref 0–99)
Triglycerides: 107 mg/dL (ref 0–149)
VLDL Cholesterol Cal: 19 mg/dL (ref 5–40)

## 2021-10-20 ENCOUNTER — Encounter: Payer: Self-pay | Admitting: Family Medicine

## 2021-10-21 ENCOUNTER — Telehealth: Payer: Self-pay

## 2021-10-21 NOTE — Telephone Encounter (Signed)
Called LVM. Pt dropped off a from to be signed by Dr Nobie Putnam. Form that was left hs no place for a signature.Pt needs an appt for medical evaluation for respirator. Please schedule pt an appt. Sunday Spillers, CMA

## 2021-11-15 ENCOUNTER — Ambulatory Visit: Payer: Medicaid Other | Admitting: Family Medicine

## 2021-11-25 ENCOUNTER — Ambulatory Visit: Payer: Medicaid Other | Admitting: Family Medicine

## 2021-12-04 ENCOUNTER — Other Ambulatory Visit: Payer: Self-pay | Admitting: Student

## 2021-12-06 ENCOUNTER — Encounter: Payer: Self-pay | Admitting: Family Medicine

## 2021-12-06 NOTE — Progress Notes (Signed)
Patient has no-showed to multiple appointments in a 6-month period. Per our no-show policy, a letter has been routed to [facility] Admin to be mailed to patient regarding likely dismissal for repeat no show. Will CC to PCP.  ° °

## 2021-12-10 ENCOUNTER — Ambulatory Visit: Payer: Medicaid Other | Admitting: Diagnostic Neuroimaging

## 2021-12-24 ENCOUNTER — Other Ambulatory Visit: Payer: Self-pay

## 2021-12-24 ENCOUNTER — Ambulatory Visit (INDEPENDENT_AMBULATORY_CARE_PROVIDER_SITE_OTHER): Payer: Medicaid Other | Admitting: Family Medicine

## 2021-12-24 ENCOUNTER — Encounter: Payer: Self-pay | Admitting: Family Medicine

## 2021-12-24 VITALS — BP 120/60 | HR 99 | Ht 59.0 in | Wt 229.2 lb

## 2021-12-24 DIAGNOSIS — Z30017 Encounter for initial prescription of implantable subdermal contraceptive: Secondary | ICD-10-CM

## 2021-12-24 DIAGNOSIS — M549 Dorsalgia, unspecified: Secondary | ICD-10-CM

## 2021-12-24 DIAGNOSIS — Z309 Encounter for contraceptive management, unspecified: Secondary | ICD-10-CM

## 2021-12-24 DIAGNOSIS — F319 Bipolar disorder, unspecified: Secondary | ICD-10-CM

## 2021-12-24 DIAGNOSIS — Z3046 Encounter for surveillance of implantable subdermal contraceptive: Secondary | ICD-10-CM | POA: Diagnosis not present

## 2021-12-24 DIAGNOSIS — R300 Dysuria: Secondary | ICD-10-CM

## 2021-12-24 LAB — POCT URINE PREGNANCY: Preg Test, Ur: NEGATIVE

## 2021-12-24 LAB — POCT URINALYSIS DIP (MANUAL ENTRY)
Bilirubin, UA: NEGATIVE
Glucose, UA: NEGATIVE mg/dL
Ketones, POC UA: NEGATIVE mg/dL
Leukocytes, UA: NEGATIVE
Nitrite, UA: NEGATIVE
Protein Ur, POC: NEGATIVE mg/dL
Spec Grav, UA: 1.015 (ref 1.010–1.025)
Urobilinogen, UA: 0.2 E.U./dL
pH, UA: 5.5 (ref 5.0–8.0)

## 2021-12-24 LAB — POCT UA - MICROSCOPIC ONLY

## 2021-12-24 NOTE — Progress Notes (Signed)
? ? ?SUBJECTIVE:  ? ?CHIEF COMPLAINT / HPI:  ? ?Contraceptive counseling ?Patient presents for discussions regarding contraception.  She is interested in Nexplanon today.  We have had discussions in the past on long-term receptive management including IUDs and Nexplanon's.  Patient has had Nexplanon twice in the past.  LMP was 11/27/2021.  Has been sexually active recently most recent sexual encounter being 4 days ago and was unprotected.  She reports that she went for STD screening as well as pregnancy test at an urgent care but has not gotten the results back from that. ? ?Anxiety/depression ?Patient reports that she is not doing well with her anxiety and depression.  She has been on a number of medications in the past but is not currently on any medications.  She is recommended to see psychiatry in the past but has not gone to see them yet.  She plans on going to see them tomorrow.  She reports destructive behavior which is what is concerning her. ? ?OBJECTIVE:  ? ?BP 120/60   Pulse 99   Ht 4\' 11"  (1.499 m)   Wt 229 lb 3.2 oz (104 kg)   LMP 11/27/2021   SpO2 97%   BMI 46.29 kg/m?   ?General: Pleasant, well-appearing, intermittently tearful throughout the conversation ?Cardiac: Regular rate and rhythm ?Respiratory: Normal work of breathing, speaking full sentences ?Psych: Initially happy, begins discussing decisions that she is made recently and becomes sad and tearful.  Denies any SI or HI ? ?Nexplanon Insertion Procedure ?Patient identified, informed consent performed, consent signed.   Patient does understand that irregular bleeding is a very common side effect of this medication. She was advised to have backup contraception for one week after placement. Pregnancy test in clinic today was negative.  Appropriate time out taken.  Patient's left arm was prepped and draped in the usual sterile fashion. The ruler used to measure and mark insertion area.  Patient was prepped with alcohol swab and then injected  with 3 ml of 1% lidocaine.  She was prepped with betadine, Nexplanon removed from packaging,  Device confirmed in needle, then inserted full length of needle and withdrawn per handbook instructions. Nexplanon was able to palpated in the patient's arm; patient palpated the insert herself. There was minimal blood loss.  Patient insertion site covered with guaze and a pressure bandage to reduce any bruising.  The patient tolerated the procedure well and was given post procedure instructions.  ? ? ? ?ASSESSMENT/PLAN:  ? ?Encounter for contraceptive management ?Patient presents for Nexplanon placement today.  We previously discussed IUD but patient has opted for Nexplanon.  She has had this birth control twice in the past.  She has been sexually active in the last week without protection but after lengthy discussion regarding ?Patient have opted to place Nexplanon today.  Patient instructed to take pregnancy test in 2 weeks.  Instructed to use protection for the next week.  Please see the procedure note above.  Patient had no further questions or concerns. ? ?Bipolar 1 disorder (HCC) ?Uncontrolled at this time.  Has been tried on multiple medications in the past.  Has seen [facility] in the past.  Discussed treatment management at this time but patient has elected to be evaluated at behavioral health.  She is planning on going tomorrow.  Denies any SI or HI at this time.  We will continue to monitor and plan for follow-up in the next 1 to 2 months. ?  ? ? ?11/29/2021, MD ?Derrel Nip  [facility]  ? ?

## 2021-12-24 NOTE — Patient Instructions (Addendum)
It was great seeing you today.  We completed a pregnancy test today and it was negative.  We placed an Nexplanon.  There is a small chance that you may be pregnant given you recently sexually active without protection.  I recommend you having a urine pregnancy test done in 2 weeks which can be done either in our clinic or you can get an at-home pregnancy test.  Regarding your urine tests I am sending them off for culture but will let you know if we need to treat any type of infection.  Regarding care after your Nexplanon insertion you can remove that bandage after 24 hours but please keep a Band-Aid on it for 3 days.  If you have any questions or concerns please call the clinic.  I hope you have a wonderful day! ? ? ?

## 2021-12-25 ENCOUNTER — Telehealth: Payer: Self-pay

## 2021-12-25 DIAGNOSIS — Z0000 Encounter for general adult medical examination without abnormal findings: Secondary | ICD-10-CM | POA: Insufficient documentation

## 2021-12-25 DIAGNOSIS — Z309 Encounter for contraceptive management, unspecified: Secondary | ICD-10-CM | POA: Insufficient documentation

## 2021-12-25 NOTE — Telephone Encounter (Signed)
Patient calls nurse line regarding results of urine culture from [facility]. Patient reports that culture results came back negative for infection.  ? ?Will forward to PCP.  ? ?Veronda Prude, RN ? ?

## 2021-12-25 NOTE — Assessment & Plan Note (Signed)
Uncontrolled at this time.  Has been tried on multiple medications in the past.  Has seen [facility] in the past.  Discussed treatment management at this time but patient has elected to be evaluated at behavioral health.  She is planning on going tomorrow.  Denies any SI or HI at this time.  We will continue to monitor and plan for follow-up in the next 1 to 2 months. ?

## 2021-12-25 NOTE — Assessment & Plan Note (Signed)
Patient presents for Nexplanon placement today.  We previously discussed IUD but patient has opted for Nexplanon.  She has had this birth control twice in the past.  She has been sexually active in the last week without protection but after lengthy discussion regarding ?Patient have opted to place Nexplanon today.  Patient instructed to take pregnancy test in 2 weeks.  Instructed to use protection for the next week.  Please see the procedure note above.  Patient had no further questions or concerns. ?

## 2021-12-26 ENCOUNTER — Encounter: Payer: Self-pay | Admitting: Family Medicine

## 2021-12-26 ENCOUNTER — Telehealth: Payer: Self-pay

## 2021-12-26 LAB — URINE CULTURE

## 2021-12-26 NOTE — Telephone Encounter (Signed)
Patient calls nurse line reporting pain and swelling post nexplanon insertion on 3/14. ? ?Patient reports the insertion site is very red and swollen. Patient reports, "I do not remember this much pain or swelling the last time." Patient denies any purulent drainage, fever or chills.  ? ?Patient reports she is having to keep the wrap on to use her arm. Patient denies seeing the nexplanon, however feels it is "bent."  ? ?Patient scheduled for tomorrow for evaluation.  ? ?Red flags discussed in the meantime.  ?

## 2021-12-26 NOTE — Progress Notes (Signed)
? ? ?  SUBJECTIVE:  ? ?CHIEF COMPLAINT: arm swelling  ?HPI:  ? ?Katelyn Mcfarland is a 31 y.o.  with history notable for BTL (failed, had pregnancy 1 year later) presenting for Nexplanon follow up.  ? ?Nexplanon was placed 3/14.  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  She then had the onset of some pain in which that was swelling over the area.  She worries that it is broken.  No numbness or tingling.  No ongoing pain she reports the swelling has resolved.  No fevers or other symptoms. ? ?The patient is sexually active with 2 partners.  She recently started using mango [employer] around 10 March.  She has 2 to 3 days of nonpainful nonpruritic bumps on the labia.  She has taken a razor to the top of these.  She is not usually shave and tries to avoid shaving as she would then get bumps.  These are not painful.  No discharge.  No other symptoms. ? ?PERTINENT  PMH / PSH/Family/Social History : Noted  ? ?OBJECTIVE:  ? ?BP 122/75   Pulse 93   Ht 4\' 11"  (1.499 m)   Wt 230 lb (104.3 kg)   LMP 11/27/2021   SpO2 99%   BMI 46.45 kg/m?   ?Today's weight:  ?Last Weight  Most recent update: 12/27/2021  9:34 AM  ? ? Weight  ?104.3 kg (230 lb)  ?      ? ?  ? ?Review of prior weights: ?Filed Weights  ? 12/27/21 0934  ?Weight: 230 lb (104.3 kg)  ?Regular rate and rhythm lungs clear bilaterally.  Upper extremity closely examined.  2+ radial pulses.  Normal strength and sensation.  Nexplanon easily palpable intact no overlying hematoma redness or warmth.3 ?GU Exam:  ?  External exam: On labia majora there are multiple small follicular lesions that appear to be somewhat improved.  Modest amount of erythema.  There are no ulcers or vesicles..  Vaginal exam notable for normal-appearing discharge.  ?Chaperoned examine, CMA [place].  ? ? ?ASSESSMENT/PLAN:  ? ?Nexplanon in place reassurance provided.  No overlying infection or hematoma.  It is in appropriate position and appears to be intact on my  examination.  Discussed not using her left arm as much at work.  This was wrapped by my CMA to reduce any further irritation to the area. ? ?Vulvar irritation, no signs of HSV, no ulcers and non-painful). Likely irritant dermatitis with small amount of folliculitis, recommend no shaving, gentle unscented cleanser, Hibiclens for follicular lesions. ? ?STI Testing, declined GC/CT. HIV/RPR today.  ? ?Follow up with PCP for migraines--medications refilled today. ? ? ? ?Dorris Singh, MD  ?Family Medicine Teaching Service  ?[place]  ? ? ?

## 2021-12-27 ENCOUNTER — Encounter: Payer: Self-pay | Admitting: Family Medicine

## 2021-12-27 ENCOUNTER — Other Ambulatory Visit: Payer: Self-pay

## 2021-12-27 ENCOUNTER — Ambulatory Visit (INDEPENDENT_AMBULATORY_CARE_PROVIDER_SITE_OTHER): Payer: Medicaid Other | Admitting: Family Medicine

## 2021-12-27 ENCOUNTER — Other Ambulatory Visit: Payer: Self-pay | Admitting: Family Medicine

## 2021-12-27 VITALS — BP 122/75 | HR 93 | Ht 59.0 in | Wt 230.0 lb

## 2021-12-27 DIAGNOSIS — N9089 Other specified noninflammatory disorders of vulva and perineum: Secondary | ICD-10-CM

## 2021-12-27 DIAGNOSIS — Z113 Encounter for screening for infections with a predominantly sexual mode of transmission: Secondary | ICD-10-CM

## 2021-12-27 DIAGNOSIS — N898 Other specified noninflammatory disorders of vagina: Secondary | ICD-10-CM

## 2021-12-27 MED ORDER — CHLORHEXIDINE GLUCONATE 4 % EX LIQD: Freq: Every day | CUTANEOUS | 0 refills | Status: DC | PRN

## 2021-12-27 MED ORDER — RIZATRIPTAN BENZOATE 10 MG PO TBDP: 10.0000 mg | ORAL_TABLET | ORAL | 11 refills | Status: DC | PRN

## 2021-12-27 MED ORDER — ETONOGESTREL 68 MG ~~LOC~~ IMPL
68.0000 mg | DRUG_IMPLANT | Freq: Once | SUBCUTANEOUS | Status: AC
Start: 2021-12-27 — End: 2021-12-24
  Administered 2021-12-24: 68 mg via SUBCUTANEOUS

## 2021-12-27 MED ORDER — PROPRANOLOL HCL 40 MG PO TABS: 40.0000 mg | ORAL_TABLET | Freq: Two times a day (BID) | ORAL | 6 refills | Status: DC

## 2021-12-27 NOTE — Patient Instructions (Addendum)
It was wonderful to see you today. ? ?Please bring ALL of your medications with you to every visit.  ? ?Today we talked about: ? ?--Ways to protect yourself from infection ? ?-- DO NOT OVERUSE  YOUR LEFT ARM AT WORK ? ?--For your pelvic area  ?- No scented soaps ?- Use an unscented cleanser ?- Twice a week you can use Hibiclens ? ?I sent in your migraine pills--please follow up with Dr. Nobie Putnam to review your headaches  ? ?Thank you for choosing [facility].  ? ?Please call [phone] with any questions about today's appointment. ? ?Please be sure to schedule follow up at the front  desk before you leave today.  ? ?Terisa Starr, MD  ?Family Medicine  ? ?

## 2021-12-27 NOTE — Addendum Note (Signed)
Addended by: Veronda Prude on: 12/27/2021 02:01 PM ? ? Modules accepted: Orders ? ?

## 2022-01-31 ENCOUNTER — Other Ambulatory Visit: Payer: Self-pay | Admitting: Family Medicine

## 2022-02-03 ENCOUNTER — Ambulatory Visit (INDEPENDENT_AMBULATORY_CARE_PROVIDER_SITE_OTHER): Payer: Medicaid Other | Admitting: Family Medicine

## 2022-02-03 ENCOUNTER — Other Ambulatory Visit (HOSPITAL_COMMUNITY)
Admission: RE | Admit: 2022-02-03 | Discharge: 2022-02-03 | Disposition: A | Discharge status: Home / Self Care | Payer: Medicaid Other | Source: Ambulatory Visit | Attending: Family Medicine | Admitting: Family Medicine

## 2022-02-03 ENCOUNTER — Other Ambulatory Visit: Payer: Self-pay

## 2022-02-03 VITALS — BP 124/83 | HR 83 | Wt 229.2 lb

## 2022-02-03 DIAGNOSIS — R45851 Suicidal ideations: Secondary | ICD-10-CM | POA: Diagnosis not present

## 2022-02-03 DIAGNOSIS — N368 Other specified disorders of urethra: Secondary | ICD-10-CM | POA: Diagnosis present

## 2022-02-03 DIAGNOSIS — R109 Unspecified abdominal pain: Secondary | ICD-10-CM

## 2022-02-03 LAB — POCT WET PREP (WET MOUNT)
Clue Cells Wet Prep Whiff POC: POSITIVE
Trichomonas Wet Prep HPF POC: ABSENT
WBC, Wet Prep HPF POC: NONE SEEN

## 2022-02-03 LAB — POCT URINALYSIS DIP (MANUAL ENTRY)
Bilirubin, UA: NEGATIVE
Glucose, UA: NEGATIVE mg/dL
Ketones, POC UA: NEGATIVE mg/dL
Leukocytes, UA: NEGATIVE
Nitrite, UA: NEGATIVE
Protein Ur, POC: NEGATIVE mg/dL
Spec Grav, UA: 1.02 (ref 1.010–1.025)
Urobilinogen, UA: 0.2 E.U./dL
pH, UA: 6 (ref 5.0–8.0)

## 2022-02-03 LAB — POCT URINE PREGNANCY: Preg Test, Ur: NEGATIVE

## 2022-02-03 LAB — POCT UA - MICROSCOPIC ONLY: WBC, Ur, HPF, POC: NONE SEEN (ref 0–5)

## 2022-02-03 MED ORDER — METRONIDAZOLE 500 MG PO TABS: 500.0000 mg | ORAL_TABLET | Freq: Three times a day (TID) | ORAL | 0 refills | Status: DC

## 2022-02-03 NOTE — Progress Notes (Signed)
? ? ?  SUBJECTIVE:  ? ?CHIEF COMPLAINT / HPI: Urinary tract infection symptoms ? ?UTI symptoms  ?Patient reports she is having symptoms similar to previous UTI  ?She feels like her bladder is "irritated"  ?She denies urinary frequency  ?She reports some vaginal odor, denies vaginal discharge  ?She states that she has had some chronic neck and upper back pain that was present before this episode, she denies lower back pain today  ?She denies fevers and chills ?She reports "shaking sometimes" (she attributes this to drinking more energy drinks) ? ?Passive SI  ?Patient marked "several days" on PHQ9  ?When discussed, she states that she usually discusses this issue with her PCP  ?She reports chronic thoughts of SI  ?She denies intent or plan today  ?She accepts offer for resources for suicide prevention as well as therapists  ?She states that she normally talks with her PCP or her family member when her depression symptoms worsen  ? ? ?PERTINENT  PMH / PSH:  ?Migraine with Aura  ?GERD  ?Tobacco Abuse  ?Sickle Cell trait  ? ? ?OBJECTIVE:  ? ?BP 124/83   Pulse 83   Wt 229 lb 3.2 oz (104 kg)   SpO2 100%   BMI 46.29 kg/m?   ?Physical Exam ?Exam conducted with a chaperone present.  ?Abdominal:  ?   General: Bowel sounds are normal. There is no distension.  ?   Palpations: Abdomen is soft.  ?   Tenderness: There is abdominal tenderness. There is no right CVA tenderness or left CVA tenderness.  ?Genitourinary: ?   General: Normal vulva.  ?   Pubic Area: No rash or pubic lice.   ?   Tanner stage (genital): 5.  ?   Labia:     ?   Right: No rash, tenderness or lesion.     ?   Left: No rash, tenderness or lesion.   ?   Urethra: No urethral swelling.  ?   Vagina: Vaginal discharge present. No tenderness.  ?   Cervix: Cervical bleeding present. No cervical motion tenderness, discharge, friability, lesion or erythema.  ?   Adnexa:     ?   Left: Tenderness present.   ? ? ?ASSESSMENT/PLAN:  ? ?Urethral irritation ?We will collect  wet prep ?We will test for gonorrhea and chlamydia ?Urine analysis collected today we will also send for urine culture ?Urine pregnancy test ? ?Suicidal ideation ?Discussed positive response to PHQ9 #9 question  ?Patient denies plan as well as intention today  ?Agreeable to resources for suicide prevention and counseling  ?Patient given information for Behavioral health urgent care as well as suicide crisis line  ?  ? ? ?Ronnald Ramp, MD ?[facility]  ?

## 2022-02-03 NOTE — Patient Instructions (Addendum)
?  If you are feeling suicidal or depression symptoms worsen please immediately go to:  ? ?If you are thinking about harming yourself or having thoughts of suicide, or if you know someone who is, seek help right away. ?If you are in crisis, make sure you are not left alone.  ?If someone else is in crisis, make sure he/she/they is not left alone ? ?Call 988 OR 1-800-273-TALK ? ?24 Hour Availability for Walk-IN services  ?[facility]  ?[address] [place], [place] ?Front Line [phone] ?Crisis [phone] ? ? ? ?Other crisis resources: ? ?[facility] [employer] ?(Domestic Violence, Rape & Victim Assistance ?[phone] ? ?[facility] [employer]    ?(ONLY from 8am-4pm)    ?[phone] ? ?[facility] Mobile Crisis Unit (24/7)   ?[phone] ? ?[place] National Suicide Hotline   ?951-498-6537 Len Childs) ? ?Psychiatry Resource List (Adults and Children) ?Most of these providers will take Medicaid. please consult your insurance for a complete and updated list of available providers. When calling to make an appointment have your insurance information available to confirm you are covered. ? ? ?[facility] ?[address] [place], [place] [address] ?[phone] ? ?[facility]  ?[address] [place], [place] ?Front Line [phone] ?Crisis [phone] ? ? ?[facility]:   ?[place]: [address]     [phone]   ?[place]: [id number],        [phone] ?[place]: [address] Suite [phone],    [phone] ?5 ?[place] Sharper: [address],                   [phone] ?Children: [facility] [address] [phone] ?

## 2022-02-03 NOTE — Assessment & Plan Note (Addendum)
We will collect wet prep ?We will test for gonorrhea and chlamydia ?Urine analysis collected today we will also send for urine culture ?Urine pregnancy test ?

## 2022-02-04 DIAGNOSIS — R45851 Suicidal ideations: Secondary | ICD-10-CM | POA: Insufficient documentation

## 2022-02-04 LAB — CERVICOVAGINAL ANCILLARY ONLY
Chlamydia: NEGATIVE
Comment: NEGATIVE
Comment: NEGATIVE
Comment: NORMAL
Neisseria Gonorrhea: NEGATIVE
Trichomonas: NEGATIVE

## 2022-02-04 NOTE — Assessment & Plan Note (Signed)
Discussed positive response to PHQ9 #9 question  ?Patient denies plan as well as intention today  ?Agreeable to resources for suicide prevention and counseling  ?Patient given information for Behavioral health urgent care as well as suicide crisis line  ?

## 2022-03-18 ENCOUNTER — Encounter: Payer: Self-pay | Admitting: *Deleted

## 2022-03-31 ENCOUNTER — Telehealth: Payer: Self-pay | Admitting: *Deleted

## 2022-03-31 DIAGNOSIS — M549 Dorsalgia, unspecified: Secondary | ICD-10-CM

## 2022-03-31 NOTE — Telephone Encounter (Signed)
Patient called and states that she is needing a referral to see ortho here in [place] for her back pain.  She was seen by an office in [place] over the past 3 months (not sure of name) and they were wanting to perform surgery.  She is asking for a second opinion for this complaint.  Will forward to MD.  Burnard Hawthorne

## 2022-04-09 ENCOUNTER — Encounter: Payer: Self-pay | Admitting: Family Medicine

## 2022-04-09 ENCOUNTER — Other Ambulatory Visit (HOSPITAL_COMMUNITY)
Admission: RE | Admit: 2022-04-09 | Discharge: 2022-04-09 | Disposition: A | Discharge status: Home / Self Care | Payer: Medicaid Other | Source: Ambulatory Visit | Attending: Family Medicine | Admitting: Family Medicine

## 2022-04-09 ENCOUNTER — Ambulatory Visit (INDEPENDENT_AMBULATORY_CARE_PROVIDER_SITE_OTHER): Payer: Medicaid Other | Admitting: Family Medicine

## 2022-04-09 ENCOUNTER — Other Ambulatory Visit: Payer: Self-pay

## 2022-04-09 VITALS — BP 135/78 | HR 90 | Wt 240.6 lb

## 2022-04-09 DIAGNOSIS — Z113 Encounter for screening for infections with a predominantly sexual mode of transmission: Secondary | ICD-10-CM

## 2022-04-09 DIAGNOSIS — Z114 Encounter for screening for human immunodeficiency virus [HIV]: Secondary | ICD-10-CM

## 2022-04-09 DIAGNOSIS — N898 Other specified noninflammatory disorders of vagina: Secondary | ICD-10-CM

## 2022-04-09 DIAGNOSIS — R350 Frequency of micturition: Secondary | ICD-10-CM | POA: Diagnosis not present

## 2022-04-09 LAB — POCT URINALYSIS DIP (MANUAL ENTRY)
Bilirubin, UA: NEGATIVE
Glucose, UA: NEGATIVE mg/dL
Ketones, POC UA: NEGATIVE mg/dL
Leukocytes, UA: NEGATIVE
Nitrite, UA: NEGATIVE
Protein Ur, POC: NEGATIVE mg/dL
Spec Grav, UA: 1.015 (ref 1.010–1.025)
Urobilinogen, UA: 0.2 E.U./dL
pH, UA: 6.5 (ref 5.0–8.0)

## 2022-04-09 LAB — POCT UA - MICROSCOPIC ONLY: WBC, Ur, HPF, POC: NONE SEEN (ref 0–5)

## 2022-04-09 LAB — POCT WET PREP (WET MOUNT)
Clue Cells Wet Prep Whiff POC: NEGATIVE
Trichomonas Wet Prep HPF POC: ABSENT

## 2022-04-09 MED ORDER — FLUCONAZOLE 150 MG PO TABS: 150.0000 mg | ORAL_TABLET | Freq: Once | ORAL | 0 refills | Status: AC

## 2022-04-09 NOTE — Assessment & Plan Note (Addendum)
Patient with vaginal irritation as well as odor.  Pelvic pain.  Discomfort present for approximately 3 weeks but odor more recent.  Has history of bacterial vaginosis.  Has been sexually active without protection.  Urinalysis not indicating infection.  Wet prep collected today showing positive for yeast cells.  Have sent for STI testing.  Prescribed Diflucan for yeast infection.  Will monitor for symptom recurrence and follow-up on the results from STI testing.  We will treat as needed.

## 2022-04-09 NOTE — Progress Notes (Signed)
    SUBJECTIVE:   CHIEF COMPLAINT / HPI:   Lower abdominal pain Patient presenting for evaluation of lower abdominal pain which has been present for approximately 3 weeks.  She reports that she has been sexually active twice in the past 2 months without protection.  Reports that she is on birth control with Nexplanon still in place.  Reports increased frequency of urination but denies any burning with urination.  She also denies any abnormal discharge but reports that over the last few days she has noticed an odor.  She also has vaginal discomfort.  No known contacts with STIs.    OBJECTIVE:   BP 135/78   Pulse 90   Wt 240 lb 9.6 oz (109.1 kg)   SpO2 99%   BMI 48.60 kg/m   General: Pleasant, 31 year old female in no acute distress Cardiac: Regular rate and rhythm Respiratory: No work of breathing, speaking full sentences Abdomen: Soft, nontender, positive bowel sounds GU: Normal external female genitalia, normal internal vaginal tissue with no irritation appreciated, moderate amount of thick white discharge, no cervical friability, no cervical motion tenderness MSK: No gross abnormalities Psych: Happy, alert, denies any SI or HI   ASSESSMENT/PLAN:   Vaginal irritation Patient with vaginal irritation as well as odor.  Pelvic pain.  Discomfort present for approximately 3 weeks but odor more recent.  Has history of bacterial vaginosis.  Has been sexually active without protection.  Urinalysis not indicating infection.  Wet prep collected today showing positive for yeast cells.  Have sent for STI testing.  Prescribed Diflucan for yeast infection.  Will monitor for symptom recurrence and follow-up on the results from STI testing.  We will treat as needed.     Celedonio Savage, MD [facility]

## 2022-04-09 NOTE — Patient Instructions (Signed)
It was wonderful seeing you today!  I am sorry you are having this irritation.  We have collected STD screening and were able to look at something on the microscope.  The microscope did show you have a yeast infection.  I sent a prescription for a medication called Diflucan to your pharmacy.  I will call you with the results if there are any abnormalities to the test that we sent off or send you a MyChart message if everything looks normal.  If you have any questions or concerns please call our clinic.  If you have any issues please let me know.  I hope you have a wonderful day and it was a pleasure taking care of you!

## 2022-04-10 ENCOUNTER — Other Ambulatory Visit: Payer: Self-pay | Admitting: Family Medicine

## 2022-04-10 LAB — CERVICOVAGINAL ANCILLARY ONLY
Chlamydia: NEGATIVE
Comment: NEGATIVE
Comment: NEGATIVE
Comment: NORMAL
Neisseria Gonorrhea: NEGATIVE
Trichomonas: NEGATIVE

## 2022-04-10 LAB — RPR: RPR Ser Ql: NONREACTIVE

## 2022-04-10 LAB — HIV ANTIBODY (ROUTINE TESTING W REFLEX): HIV Screen 4th Generation wRfx: NONREACTIVE

## 2022-04-29 ENCOUNTER — Telehealth: Payer: Self-pay | Admitting: Student

## 2022-04-29 NOTE — Telephone Encounter (Signed)
Clinical info completed on work form.  Placed form in Dr. Delaney Meigs box for completion.    When form is completed, please route note to "RN Team" and place in wall pocket in front office.   Eldine Rencher, CMA

## 2022-04-29 NOTE — Telephone Encounter (Signed)
Patient dropped off form to be completed last seen by doctor her was on 04/09/2022

## 2022-04-30 ENCOUNTER — Telehealth: Payer: Self-pay | Admitting: Student

## 2022-04-30 NOTE — Telephone Encounter (Signed)
Called patient regarding return to work form she requested be filled out.  Left message stating I am unsure as to what she exactly needs the form filled out for as I do not see documentation regarding reasons that she was out of work prior or would need to be out of work.  Recommended she message me via MyChart or call during office hours to discuss what exactly that she needed regarding this form.

## 2022-04-30 NOTE — Telephone Encounter (Signed)
Patient calls nurse line reporting form needs to be completed by 11am this morning 7/19.  Form was dropped yesterday on 7/18.  Patient reminded of clinic policy form turn around time.

## 2022-05-05 NOTE — Telephone Encounter (Signed)
I have called patient asking for clarification regarding this form. Will hold onto form until receiving response. If she calls, please ask her for more information.

## 2022-05-08 ENCOUNTER — Encounter: Payer: Self-pay | Admitting: Student

## 2022-05-13 ENCOUNTER — Ambulatory Visit: Payer: Medicaid Other | Admitting: Orthopaedic Surgery

## 2022-05-13 NOTE — Telephone Encounter (Signed)
Emailed form to patient per request. Copy made and placed in batch scanning.   Veronda Prude, RN

## 2022-05-20 ENCOUNTER — Ambulatory Visit: Payer: Medicaid Other | Admitting: Orthopaedic Surgery

## 2022-05-30 ENCOUNTER — Other Ambulatory Visit: Payer: Self-pay | Admitting: Family Medicine

## 2022-05-30 ENCOUNTER — Encounter: Payer: Self-pay | Admitting: Family Medicine

## 2022-05-30 ENCOUNTER — Ambulatory Visit (INDEPENDENT_AMBULATORY_CARE_PROVIDER_SITE_OTHER): Payer: Self-pay | Admitting: Family Medicine

## 2022-05-30 VITALS — BP 122/71 | HR 104 | Wt 240.2 lb

## 2022-05-30 DIAGNOSIS — J029 Acute pharyngitis, unspecified: Secondary | ICD-10-CM

## 2022-05-30 LAB — POCT RAPID STREP A (OFFICE): Rapid Strep A Screen: NEGATIVE

## 2022-05-30 MED ORDER — FLUTICASONE PROPIONATE 50 MCG/ACT NA SUSP: 2.0000 | Freq: Every day | NASAL | 1 refills | Status: DC

## 2022-05-30 NOTE — Assessment & Plan Note (Signed)
Patient presents with 2 days of throat pain.  Strep test negative.  Physical exam reassuring, oropharynx without exudates or lymphadenopathy.  No tenderness on palpation.  Discussed symptomatic management, with Tylenol as needed for pain, warm tea with honey, throat lozenges.  Also prescribed Flonase to use daily to help with her chronic cough and nasal symptoms.  Strict return precautions discussed.

## 2022-05-30 NOTE — Patient Instructions (Addendum)
It was great seeing you today!  You came in for throat pain. Your strep test was negative. This is most likely a viral infection. This will take time to get over. The treatment for this is supportive care. You can use Tylenol for pain relief. You can use  a teaspoon of honey by itself or mixed with water to help with cough and throat pain.  Sent in Flonase to help with the constant nasal symptoms and cough.   Go to the ED if you are not unable to drink any liquids, worsening throat pain, new or concerning symptoms.  I recommended seeing your PCP for follow-up in the next couple of weeks to check on your pain, and also to get your Pap smear done.  Visit Reminders: - Stop by the pharmacy to pick up your prescriptions  - Continue to work on your healthy eating habits and incorporating exercise into your daily life.   Feel free to call with any questions or concerns at any time, at [phone].   Take care,  Dr. Cora Collum [facility]

## 2022-05-30 NOTE — Progress Notes (Signed)
    SUBJECTIVE:   CHIEF COMPLAINT / HPI:   Patient presents for strep testing. States she had a itch in her throat last night. States she is always stuffy and fatigued cant tell when she is sick. This morning throat started hurting really bad, couldn't swallow. Felt throat was closing. Vomiting mucous and liquid. Was able to drink some tea with lemon today, is currently able to swallow. Denies coughing today, has had a chronic cough for the past couple of months. Denies taking flonase states she ran out. Denies fevers. Has chronic body aches, nothing new over the past couple of days. No sick contacts in the house. 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   PERTINENT  PMH / PSH: Reviewed   OBJECTIVE:   BP 122/71   Pulse (!) 104   Wt 240 lb 3.2 oz (109 kg)   SpO2 99%   BMI 48.51 kg/m    Physical exam General: well appearing, NAD HEENT: MMM. Oropharynx only partially visualized, without exudates. No LAD. No tenderness of neck on palpation. Normal ROM.  Cardiovascular: Tachycardic though HR 96 on repeat, no murmurs Lungs: CTAB. Normal WOB Abdomen: soft, non-distended Skin: warm, dry. No edema  ASSESSMENT/PLAN:   Throat pain Patient presents with 2 days of throat pain.  Strep test negative.  Physical exam reassuring, oropharynx without exudates or lymphadenopathy.  No tenderness on palpation.  Discussed symptomatic management, with Tylenol as needed for pain, warm tea with honey, throat lozenges.  Also prescribed Flonase to use daily to help with her chronic cough and nasal symptoms.  Strict return precautions discussed.   Health maintenance Patient states she is scheduling appointment with PCP for pap smear   Cora Collum, DO [facility]

## 2022-06-04 ENCOUNTER — Ambulatory Visit (INDEPENDENT_AMBULATORY_CARE_PROVIDER_SITE_OTHER): Payer: Self-pay | Admitting: Student

## 2022-06-04 VITALS — BP 118/74 | HR 87 | Ht 59.0 in | Wt 238.0 lb

## 2022-06-04 DIAGNOSIS — M79641 Pain in right hand: Secondary | ICD-10-CM
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
## 2022-06-04 NOTE — Patient Instructions (Signed)
It was wonderful to meet you today. Thank you for allowing me to be a part of your care. Below is a short summary of what we discussed at your visit today:  Left and pain is likely contusion and being that you are able to move the hand appropriately with good strength, I believe you can return to work safely.  I have also provided you a letter to return to work.  Please bring all of your medications to every appointment!  If you have any questions or concerns, please do not hesitate to contact us via phone or MyChart message.   Jerre Simon, MD [facility]

## 2022-06-30 ENCOUNTER — Ambulatory Visit (INDEPENDENT_AMBULATORY_CARE_PROVIDER_SITE_OTHER): Payer: No Typology Code available for payment source | Admitting: Pharmacist

## 2022-06-30 ENCOUNTER — Encounter: Payer: Self-pay | Admitting: Pharmacist

## 2022-06-30 VITALS — BP 131/79 | HR 91 | Wt 235.4 lb

## 2022-06-30 DIAGNOSIS — Z87891 Personal history of nicotine dependence: Secondary | ICD-10-CM | POA: Diagnosis not present

## 2022-06-30 MED ORDER — NORTRIPTYLINE HCL 25 MG PO CAPS: 25.0000 mg | ORAL_CAPSULE | Freq: Every day | ORAL | 3 refills | Status: DC

## 2022-06-30 MED ORDER — ONDANSETRON 4 MG PO TBDP: 4.0000 mg | ORAL_TABLET | Freq: Three times a day (TID) | ORAL | 1 refills | Status: DC | PRN

## 2022-06-30 MED ORDER — NICOTINE 14 MG/24HR TD PT24: 14.0000 mg | MEDICATED_PATCH | Freq: Every day | TRANSDERMAL | 3 refills | Status: DC

## 2022-06-30 NOTE — Patient Instructions (Addendum)
It was good to see you today! Plan Started Nortriptyline 25 mg once daily Started Nicotine 14 mg patches Follow up by phone in 2 weeks

## 2022-06-30 NOTE — Progress Notes (Signed)
S:   Chief Complaint  Patient presents with   Medication Management    Tobacco Cessation   Katelyn Mcfarland is a 31 y.o. female who presents for evaluation/assistance with tobacco dependence.  PMH is significant for HTN, GERD, migraine.  Patient was referred and last seen by Dr. Adah Salvage, on 06/04/2022.   Nausea almost daily - moderate to severe at times - interested in ondansetron refill and additional workup.   Age when started using tobacco on a daily basis 10. Brand smoked [place]. Number of cigarettes/day 10.  Estimated nicotine content per cigarette (mg) 1.2  Estimated nicotine intake per day 12 mg.   Denies waking to smoke.  Fagerstrom Score Question Scoring Patient Score  How soon after waking do you smoke your first cigarette? 5-30 mins (2) 2  Do you find it difficult NOT to smoke in places where you shouldn't? No (0) 0  Which cigarette would you most hate to give up?  Any other one (0) 0   How many cigarettes do you smoke/day?  11-20 (1)   1  Do you smoke more during the first few hours after waking? No (0) 0  Do you smoke if you are so ill you cannot get out of bed? No (0) 0    Total Score  3   Score interpretation: low 1-2, low-to-moderate 3-4, moderate 5-7, high >7  Longest time ever been tobacco free; quit for 3 years from August 2020 to August 2023. Life has been stressful, only been sleeping about 2 hours per day. Many days she never sleeps. Feels overwhelmed and exhausted from work. Medications used in past cessation efforts include: nicotine patches, nortriptyline  Rates IMPORTANCE of quitting tobacco on 1-10 scale of 10. Rates CONFIDENCE of quitting tobacco on 1-10 scale of 8.  Most common triggers to use tobacco include; stress   Motivation to quit: Knows it is bad for her and her oldest daughter has been adamant that she wants her to quit smoking.  O:   Review of Systems  Respiratory:  Negative for shortness of breath and wheezing.    Gastrointestinal:  Positive for nausea.  All other systems reviewed and are negative.   Physical Exam Vitals reviewed.  Constitutional:      Appearance: Normal appearance. She is obese.  Pulmonary:     Effort: Pulmonary effort is normal.     Breath sounds: Normal breath sounds.  Neurological:     Mental Status: She is alert.  Psychiatric:        Mood and Affect: Mood normal.        Thought Content: Thought content normal.      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    -Initiated nicotine replacement tx with nicotine patches 14 mg. Patient counseled on purpose, proper use, and potential adverse effects, including nicotine withdrawal symptoms. Patient educated to remove patch at night to avoid vivid dreams. - Initiated nortriptyline 25 mg one daily to help with sleep.  Chronic nausea, using ondanestron most days and multiple times per day when symptoms are severe.  Discussed sample request for more ondansetron with PCP, Dr. Madison Hickman.   Refill provided - patient scheduled back with Dr. Madison Hickman in 2 weeks    Written patient instructions provided. Patient verbalized understanding of treatment plan.  Total time in face to face counseling 30 minutes.  Follow-up:  Pharmacist over the phone in 2 weeks. PCP clinic visit with Dr. Madison Hickman on October 2nd.  Patient seen with Martina Sinner, PharmD Candidate.

## 2022-06-30 NOTE — Assessment & Plan Note (Signed)
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    -Initiated nicotine replacement tx with nicotine patches 14 mg. Patient counseled on purpose, proper use, and potential adverse effects, including nicotine withdrawal symptoms. Patient educated to remove patch at night to avoid vivid dreams. - Initiated nortriptyline 25 mg one daily to help with sleep.

## 2022-06-30 NOTE — Progress Notes (Signed)
Reviewed: I agree with Dr. Koval's documentation and management. 

## 2022-07-04 ENCOUNTER — Other Ambulatory Visit (HOSPITAL_COMMUNITY)
Admission: RE | Admit: 2022-07-04 | Discharge: 2022-07-04 | Disposition: A | Discharge status: Home / Self Care | Payer: No Typology Code available for payment source | Source: Ambulatory Visit | Attending: Family Medicine | Admitting: Family Medicine

## 2022-07-04 ENCOUNTER — Encounter: Payer: Self-pay | Admitting: Family Medicine

## 2022-07-04 ENCOUNTER — Ambulatory Visit (INDEPENDENT_AMBULATORY_CARE_PROVIDER_SITE_OTHER): Payer: No Typology Code available for payment source | Admitting: Family Medicine

## 2022-07-04 VITALS — BP 127/90 | HR 96 | Ht 59.0 in | Wt 233.2 lb

## 2022-07-04 DIAGNOSIS — Z113 Encounter for screening for infections with a predominantly sexual mode of transmission: Secondary | ICD-10-CM

## 2022-07-05 LAB — HIV ANTIBODY (ROUTINE TESTING W REFLEX): HIV Screen 4th Generation wRfx: NONREACTIVE

## 2022-07-05 LAB — RPR: RPR Ser Ql: NONREACTIVE

## 2022-07-05 NOTE — Progress Notes (Signed)
     Katelyn Mcfarland is a 31 year old woman who presents today for an STI check.  Unfortunately, she left prior to being seen by myself as I was running late with appointments.  The CMA did have her complete a blood draw for HIV and RPR along with urine cytology for gonorrhea and chlamydia screening.  We will follow-up on these results as they come in.  BP (!) 127/90   Pulse 96   Ht 4\' 11"  (1.499 m)   Wt 233 lb 3.2 oz (105.8 kg)   LMP 05/19/2022   SpO2 100%   BMI 47.10 kg/m     Ezequiel Essex, MD [place]

## 2022-07-07 LAB — URINE CYTOLOGY ANCILLARY ONLY
Candida Urine: NEGATIVE
Chlamydia: NEGATIVE
Comment: NEGATIVE
Comment: NEGATIVE
Comment: NORMAL
Neisseria Gonorrhea: NEGATIVE
Trichomonas: NEGATIVE

## 2022-07-08 ENCOUNTER — Ambulatory Visit: Payer: No Typology Code available for payment source | Admitting: Student

## 2022-07-14 ENCOUNTER — Ambulatory Visit (INDEPENDENT_AMBULATORY_CARE_PROVIDER_SITE_OTHER): Payer: No Typology Code available for payment source | Admitting: Student

## 2022-07-14 VITALS — BP 120/76 | HR 92 | Ht 59.0 in | Wt 236.2 lb

## 2022-07-14 DIAGNOSIS — Z23 Encounter for immunization: Secondary | ICD-10-CM | POA: Diagnosis not present

## 2022-07-14 DIAGNOSIS — R110 Nausea: Secondary | ICD-10-CM | POA: Diagnosis not present

## 2022-07-14 DIAGNOSIS — Z975 Presence of (intrauterine) contraceptive device: Secondary | ICD-10-CM | POA: Insufficient documentation

## 2022-07-14 NOTE — Progress Notes (Signed)
  SUBJECTIVE:   CHIEF COMPLAINT / HPI:   Chronic nausea: since 2021 since she had her last child, comes out of nowhere, zofran sometimes helps, sometimes presents with vomiting as well. Episodes every 2-3x/week with vomiting less frequently. Lasts up to a couple hours, less if she gets Zofran. Zofran makes her constipated. Denies abdominal pain. Normally gets sick on an empty stomach. Unable identify specific triggers. Urinating normally. Denies regular fevers.  She has irregular periods due to her Nexplanon being in place.  PERTINENT  PMH / PSH: Tubal ligation, Place, GERD, sickle cell trait  OBJECTIVE:  BP 120/76   Pulse 92   Ht 4\' 11"  (1.499 m)   Wt 236 lb 3.2 oz (107.1 kg)   LMP 05/19/2022   SpO2 98%   BMI 47.71 kg/m   General: NAD, pleasant, able to participate in exam Cardiac: RRR, no murmurs auscultated Respiratory: CTAB, normal WOB Abdomen: soft, non-tender, non-distended, normoactive bowel sounds Extremities: warm and well perfused, no edema or cyanosis Psych: Normal affect and mood  ASSESSMENT/PLAN:  Chronic nausea Assessment & Plan: Chronic for greater than 3 years.  Given timeline, unlikely that there is concern for acute abdominal process.  Lack of accompanying symptoms with the exception of infrequent vomiting.  Physical exam unremarkable and not helpful in aiding diagnosis.  Obtain some baseline labs to investigate hepatic pathology, electrolyte dysfunction, kidney disease, thyroid.  Unlikely to be diabetes although with BMI will screen.  Cancerous process unlikely given chronicity and stable weight and without accompanying symptoms.  Given history, likely unrelated to substance or alcohol use.  Was referred to GI in the past but did not go and have placed another referral today upon patient agreement.  Advised diary recording frequency of events and accompanying symptoms.  May continue Zofran for symptomatic assistance.  Orders: -     Comprehensive metabolic panel -      Hemoglobin A1c -     TSH -     Ambulatory referral to Gastroenterology  Need for immunization against influenza -     Flu Vaccine QUAD 98mo+IM (Fluarix, Fluzone & Alfiuria Quad PF)   Return if symptoms worsen or fail to improve. [name], DO 07/14/2022, 1:20 PM PGY-2, [place]

## 2022-07-14 NOTE — Assessment & Plan Note (Addendum)
Chronic for greater than 3 years.  Given timeline, unlikely that there is concern for acute abdominal process.  Lack of accompanying symptoms with the exception of infrequent vomiting.  Physical exam unremarkable and not helpful in aiding diagnosis.  Obtain some baseline labs to investigate hepatic pathology, electrolyte dysfunction, kidney disease, thyroid.  Unlikely to be diabetes although with BMI will screen.  Cancerous process unlikely given chronicity and stable weight and without accompanying symptoms.  Given history, likely unrelated to substance or alcohol use.  Was referred to GI in the past but did not go and have placed another referral today upon patient agreement.  Advised diary recording frequency of events and accompanying symptoms.  May continue Zofran for symptomatic assistance.

## 2022-07-14 NOTE — Patient Instructions (Signed)
It was great to see you today! Thank you for choosing [facility] for your primary care. Katelyn Mcfarland was seen for nausea/vomiting.  Today we addressed: I placed referral to GI upon your request.  We are getting some labs and I would recommend making a diary of when these episodes happen so that you have more information to go off of when you see GI in the future.  If you need more Zofran refills at any point please let me know.  If you haven't already, sign up for My Chart to have easy access to your labs results, and communication with your primary care physician.  We are checking some labs today. If they are abnormal, I will call you. If they are normal, I will send you a MyChart message (if it is active) or a letter in the mail. If you do not hear about your labs in the next 2 weeks, please call the office. I recommend that you always bring your medications to each appointment as this makes it easy to ensure you are on the correct medications and helps [name] not miss refills when you need them. Call the clinic at [phone] if your symptoms worsen or you have any concerns.  You should return to our clinic Return if symptoms worsen or fail to improve. Please arrive 15 minutes before your appointment to ensure smooth check in process.  We appreciate your efforts in making this happen.  Thank you for allowing me to participate in your care, [name], DO 07/14/2022, 10:14 AM PGY-2, [place]

## 2022-07-15 ENCOUNTER — Encounter: Payer: Self-pay | Admitting: Student

## 2022-07-15 DIAGNOSIS — R7303 Prediabetes: Secondary | ICD-10-CM | POA: Insufficient documentation

## 2022-07-15 LAB — COMPREHENSIVE METABOLIC PANEL
ALT: 15 IU/L (ref 0–32)
AST: 22 IU/L (ref 0–40)
Albumin/Globulin Ratio: 1.5 (ref 1.2–2.2)
Albumin: 4.3 g/dL (ref 3.9–4.9)
Alkaline Phosphatase: 69 IU/L (ref 44–121)
BUN/Creatinine Ratio: 10 (ref 9–23)
BUN: 10 mg/dL (ref 6–20)
Bilirubin Total: <0.2 mg/dL (ref 0.0–1.2)
CO2: 20 mmol/L (ref 20–29)
Calcium: 9.3 mg/dL (ref 8.7–10.2)
Chloride: 100 mmol/L (ref 96–106)
Creatinine, Ser: 0.99 mg/dL (ref 0.57–1.00)
Globulin, Total: 2.8 g/dL (ref 1.5–4.5)
Glucose: 77 mg/dL (ref 70–99)
Potassium: 3.7 mmol/L (ref 3.5–5.2)
Sodium: 140 mmol/L (ref 134–144)
Total Protein: 7.1 g/dL (ref 6.0–8.5)
eGFR: 77 mL/min/{1.73_m2} (ref >59)

## 2022-07-15 LAB — HEMOGLOBIN A1C
Est. average glucose Bld gHb Est-mCnc: 126 mg/dL
Hgb A1c MFr Bld: 6 % — ABNORMAL HIGH (ref 4.8–5.6)

## 2022-07-15 LAB — TSH: TSH: 3.19 u[IU]/mL (ref 0.450–4.500)

## 2022-07-30 ENCOUNTER — Telehealth: Payer: Self-pay | Admitting: Pharmacist

## 2022-07-30 DIAGNOSIS — Z87891 Personal history of nicotine dependence: Secondary | ICD-10-CM

## 2022-07-30 NOTE — Telephone Encounter (Signed)
Patient contacted for follow/up of tobacco intake reduction / cessation attempt.   Since last contact patient reports smoking ~ 1/2 ppd  Medications currently being used;  Nicotine patch - 14mg  applied daily (taking every other day currently) Not currently taking Nortriptyline - 25mg  QHS  Patient denies any significant side effects from tobacco cessation therapy.   Continues to rates IMPORTANCE of quitting tobacco as high.  Rate CONFIDENCE of quitting tobacco as moderate.   Most common triggers to use tobacco include; busy and life stress.   Short-term goal is to reduce to 1 pack of cigs per week.    Total time with patient call and documentation of interaction: 11 minutes. Follow-up phone call planned: 1 month

## 2022-07-30 NOTE — Telephone Encounter (Signed)
-----   Message from Leavy Cella, [place] sent at 06/30/2022 11:34 AM EDT ----- Regarding: Tobacco Cessation F/U

## 2022-07-30 NOTE — Telephone Encounter (Signed)
Noted and agree. 

## 2022-07-30 NOTE — Assessment & Plan Note (Signed)
Patient contacted for follow/up of tobacco intake reduction / cessation attempt.   Since last contact patient reports smoking ~ 1/2 ppd  Medications currently being used;  Nicotine patch - 14mg  applied daily (taking every other day currently) Not currently taking Nortriptyline - 25mg  QHS  Patient denies any significant side effects from tobacco cessation therapy.   Continues to rates IMPORTANCE of quitting tobacco as high.  Rate CONFIDENCE of quitting tobacco as moderate.   Most common triggers to use tobacco include; busy and life stress.   Short-term goal is to reduce to 1 pack of cigs per week.

## 2022-08-01 ENCOUNTER — Ambulatory Visit: Payer: Medicaid Other | Admitting: Obstetrics and Gynecology

## 2022-08-11 ENCOUNTER — Other Ambulatory Visit (HOSPITAL_COMMUNITY)
Admission: RE | Admit: 2022-08-11 | Discharge: 2022-08-11 | Disposition: A | Discharge status: Home / Self Care | Payer: No Typology Code available for payment source | Source: Ambulatory Visit | Attending: Family Medicine | Admitting: Family Medicine

## 2022-08-11 ENCOUNTER — Ambulatory Visit (INDEPENDENT_AMBULATORY_CARE_PROVIDER_SITE_OTHER): Payer: No Typology Code available for payment source | Admitting: Family Medicine

## 2022-08-11 ENCOUNTER — Encounter: Payer: Self-pay | Admitting: Family Medicine

## 2022-08-11 VITALS — BP 113/96 | HR 98 | Ht 59.0 in | Wt 230.5 lb

## 2022-08-11 DIAGNOSIS — Z124 Encounter for screening for malignant neoplasm of cervix: Secondary | ICD-10-CM | POA: Insufficient documentation

## 2022-08-11 DIAGNOSIS — M79642 Pain in left hand: Secondary | ICD-10-CM

## 2022-08-11 DIAGNOSIS — Z111 Encounter for screening for respiratory tuberculosis: Secondary | ICD-10-CM

## 2022-08-11 MED ORDER — DICLOFENAC SODIUM 1 % EX GEL: 2.0000 g | Freq: Four times a day (QID) | CUTANEOUS | 0 refills | Status: DC

## 2022-08-11 NOTE — Patient Instructions (Addendum)
Your pap smear will take a few days to come back. I will let you know of the results and when we will need to follow it up.   Finger pain - I would recommend getting over the counter Voltaren gel (diclofenac) and use up to 4 times daily to see if that helps - If you decide to go ahead with doing occupational therapy, just let me know and we can put in the referral
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
## 2022-08-13 LAB — QUANTIFERON-TB GOLD PLUS
QuantiFERON Mitogen Value: >10 IU/mL
QuantiFERON Nil Value: 0.03 IU/mL
QuantiFERON TB1 Ag Value: 0.03 IU/mL
QuantiFERON TB2 Ag Value: 0.04 IU/mL
QuantiFERON-TB Gold Plus: NEGATIVE

## 2022-08-14 LAB — CYTOLOGY - PAP
Comment: NEGATIVE
Diagnosis: NEGATIVE
High risk HPV: NEGATIVE

## 2022-09-08 ENCOUNTER — Telehealth: Payer: Self-pay | Admitting: Pharmacist

## 2022-09-08 NOTE — Telephone Encounter (Signed)
Patient contacted for follow/up of tobacco intake reduction / cessation attempt.   Since last contact patient reports high levels of stress and anxiety.  States she feels like she "simply can't catch a break"  Medications currently being used;  No longer taking any NRT.    Nortriptyline - 25mg  QHS  Patient denies any significant side effects from tobacco cessation therapy.   Continues to rates IMPORTANCE of quitting tobacco as high.  Rates CONFIDENCE of quitting tobacco as low currently due to stress, anxiety and short-temper/anger.   Most common triggers to use tobacco include; none currently   Motivation to quit: health/breathing  Agreed to a follow-up Rx clinic visit to discuss options including varenicline.  Total time with patient call and documentation of interaction: 12 minutes. Follow-up in-person on 09/17/2022

## 2022-09-08 NOTE — Telephone Encounter (Signed)
Noted and agree. 

## 2022-09-08 NOTE — Telephone Encounter (Signed)
-----   Message from Kathrin Ruddy, RPH-CPP sent at 07/30/2022 12:14 PM EDT ----- Regarding: Tobacco intake reduction Smoking ~ 1/2 ppd with call on 11/18 Goal short term was to decrase to 1 pack per week with use of nicotine patch 14mg

## 2022-09-17 ENCOUNTER — Ambulatory Visit: Payer: No Typology Code available for payment source | Admitting: Pharmacist

## 2022-10-30 ENCOUNTER — Encounter: Payer: Self-pay | Admitting: Gastroenterology

## 2022-10-30 ENCOUNTER — Ambulatory Visit (INDEPENDENT_AMBULATORY_CARE_PROVIDER_SITE_OTHER): Payer: Medicaid Other | Admitting: Gastroenterology

## 2022-10-30 VITALS — BP 110/68 | HR 95 | Ht 59.0 in | Wt 230.8 lb

## 2022-10-30 DIAGNOSIS — K5909 Other constipation: Secondary | ICD-10-CM | POA: Diagnosis not present

## 2022-10-30 DIAGNOSIS — R112 Nausea with vomiting, unspecified: Secondary | ICD-10-CM | POA: Diagnosis not present

## 2022-10-30 DIAGNOSIS — R1013 Epigastric pain: Secondary | ICD-10-CM

## 2022-10-30 MED ORDER — PROCHLORPERAZINE MALEATE 10 MG PO TABS: 10.0000 mg | ORAL_TABLET | Freq: Three times a day (TID) | ORAL | 2 refills | Status: DC | PRN

## 2022-10-30 NOTE — Progress Notes (Signed)
[facility] Consult Note:  History: Katelyn Mcfarland 10/30/2022  Referring provider: [name], DO  Reason for consult/chief complaint: Nausea (Nausea once a week for years, Vomiting once a week, eating solid foods, 5 lbs weight loss, zofran 1 tablet PRN helps some, no aggravating/relieving factors)   Subjective  HPI: Intermittent nausea last few years, discussed with primary care October 2023. [name] was referred back to see [name] afterward for chronic nausea and vomiting. EGD/Colon Oct 2020 - seen for gen abd pain alternating BMs, nausea/vomiting Problems worse over time.  All seemed to begin after most recent pregnancy and delivery (several yrs)  She has frequent nausea and early satiety, vomits about once a week and sometimes sees residual food in that.  She has lost perhaps 5 pounds in the last several months that she attributes to the symptoms.  Zofran often helps relieve the symptoms but causes constipation. She also describes frequent headaches several days a week and unfortunately returned to smoking about 6 months ago.  Headaches definitely worsen her digestive symptoms.  ROS:  Review of Systems  Constitutional:  Negative for appetite change and unexpected weight change.  HENT:  Negative for mouth sores and voice change.   Eyes:  Negative for pain and redness.  Respiratory:  Negative for cough and shortness of breath.   Cardiovascular:  Negative for chest pain and palpitations.  Genitourinary:  Negative for dysuria and hematuria.  Musculoskeletal:  Negative for arthralgias and myalgias.  Skin:  Negative for pallor and rash.  Neurological:  Positive for headaches. Negative for weakness.  Hematological:  Negative for adenopathy.    Frequent migraines, saw neurology Auf 2022  Past Medical History: Past Medical History:  Diagnosis Date   Allergy    Chlamydia    age 47yo; hx/o trichomonas age 38yo   Chronic headache    Depression    doing ok,  declined meds   High cholesterol    Infection    UTI   Pregnancy induced hypertension    Trichomonas vaginalis infection    Vaginal Pap smear, abnormal    bx, cryo     Past Surgical History: Past Surgical History:  Procedure Laterality Date   INDUCED ABORTION     TUBAL LIGATION Bilateral 11/15/2018   FAILED Procedure: POST PARTUM TUBAL LIGATION;  Surgeon: Chancy Milroy, MD;  Location: [facility];  Service: Gynecology;  Laterality: Bilateral;   WISDOM TOOTH EXTRACTION       Family History: Family History  Problem Relation Age of Onset   Hypertension Mother    Diabetes Mother    Asthma Sister    Cancer Paternal Grandmother    Cancer Paternal Grandfather    Cancer Maternal Grandmother    Kidney disease Maternal Grandmother    Diabetes Maternal Grandmother    Heart disease Neg Hx    Stroke Neg Hx     Social History: Social History   Socioeconomic History   Marital status: Single    Spouse name: Not on file   Number of children: 4   Years of education: Not on file   Highest education level: Not on file  Occupational History   Occupation: [employer]   Occupation: Contract CNA  Tobacco Use   Smoking status: Some Days    Packs/day: 1.00    Years: 16.00    Total pack years: 16.00    Types: Cigarettes    Start date: 10/13/2000   Smokeless tobacco: Never   Tobacco comments:  stopped August 2020, restarted smoking 05/2022 1/2ppd  Vaping Use   Vaping Use: Never used  Substance and Sexual Activity   Alcohol use: Not Currently    Comment: not currently, socially   Drug use: Not Currently    Types: Marijuana    Comment: last was october 2020   Sexual activity: Not Currently    Birth control/protection: Surgical    Comment: tubal ligation  Other Topics Concern   Not on file  Social History Narrative   Single,  children, exercise - none.  Work - dietary aid    1 cup of coffee daily, 1 red bull daily   Limited soda - day 6 of no soda    Social Determinants of Corporate investment banker Strain: Not on file  Food Insecurity: Not on file  Transportation Needs: Not on file  Physical Activity: Not on file  Stress: Not on file  Social Connections: Not on file    Allergies: No Known Allergies  Outpatient Meds: Current Outpatient Medications  Medication Sig Dispense Refill   acetaminophen (TYLENOL) 500 MG tablet Take 1,000 mg by mouth every 6 (six) hours as needed for mild pain (or headaches).     diclofenac Sodium (VOLTAREN) 1 % GEL Apply 2 g topically 4 (four) times daily. 350 g 0   Multiple Vitamin (MULTI VITAMIN PO) Take 1 capsule by mouth daily. Seamoss multivitamin daily     nicotine (NICODERM CQ - DOSED IN MG/24 HOURS) 14 mg/24hr patch Place 1 patch (14 mg total) onto the skin daily. 30 patch 3   ondansetron (ZOFRAN-ODT) 4 MG disintegrating tablet Take 1 tablet (4 mg total) by mouth every 8 (eight) hours as needed for nausea or vomiting. 30 tablet 1   prochlorperazine (COMPAZINE) 10 MG tablet Take 1 tablet (10 mg total) by mouth every 8 (eight) hours as needed for nausea or vomiting. 30 tablet 2   chlorhexidine (HIBICLENS) 4 % external liquid Apply topically daily as needed. (Patient not taking: Reported on 06/30/2022) 120 mL 0   fluticasone (FLONASE) 50 MCG/ACT nasal spray SHAKE LIQUID AND USE 2 SPRAYS IN EACH NOSTRIL DAILY (Patient not taking: Reported on 06/30/2022) 16 g 2   nortriptyline (PAMELOR) 25 MG capsule Take 1 capsule (25 mg total) by mouth at bedtime. (Patient not taking: Reported on 07/30/2022) 30 capsule 3   propranolol (INDERAL) 40 MG tablet Take 1 tablet (40 mg total) by mouth 2 (two) times daily. (Patient not taking: Reported on 06/30/2022) 60 tablet 6   rizatriptan (MAXALT-MLT) 10 MG disintegrating tablet Take 1 tablet (10 mg total) by mouth as needed for migraine. May repeat in 2 hours if needed (Patient not taking: Reported on 10/30/2022) 9 tablet 11   No current facility-administered medications for  this visit.      ___________________________________________________________________ Objective   Exam:  BP 110/68 (BP Location: Left Arm, Patient Position: Sitting)   Pulse 95   Ht 4\' 11"  (1.499 m)   Wt 230 lb 12.8 oz (104.7 kg)   LMP  (LMP Unknown) Comment: Nexplanon 2023  SpO2 99%   BMI 46.62 kg/m  Wt Readings from Last 3 Encounters:  10/30/22 230 lb 12.8 oz (104.7 kg)  08/11/22 230 lb 8 oz (104.6 kg)  07/14/22 236 lb 3.2 oz (107.1 kg)    General: Very pleasant, well-appearing. Eyes: sclera anicteric, no redness ENT: oral mucosa moist without lesions, no cervical or supraclavicular lymphadenopathy CV: Regular without appreciable murmur, no JVD, no peripheral edema Resp: clear to auscultation  bilaterally, normal RR and effort noted GI: soft, no tenderness, with active bowel sounds. No guarding or palpable organomegaly noted.  No distention or bruit Skin; warm and dry, no rash or jaundice noted Neuro: awake, alert and oriented x 3. Normal gross motor function and fluent speech  Labs:     Latest Ref Rng & Units 07/24/2021   12:34 PM 05/22/2021    3:43 PM 04/16/2021   10:24 AM  CBC  WBC 4.0 - 10.5 K/uL 9.3  6.1  7.3   Hemoglobin 12.0 - 15.0 g/dL 12.7  13.2  12.4   Hematocrit 36.0 - 46.0 % 39.5  41.4  38.2   Platelets 150 - 400 K/uL 249  247  272       Latest Ref Rng & Units 07/14/2022   12:18 PM 07/24/2021   12:34 PM 05/22/2021    3:43 PM  CMP  Glucose 70 - 99 mg/dL 77  103  92   BUN 6 - 20 mg/dL 10  8  11    Creatinine 0.57 - 1.00 mg/dL 0.99  0.86  0.78   Sodium 134 - 144 mmol/L 140  137  137   Potassium 3.5 - 5.2 mmol/L 3.7  3.8  3.8   Chloride 96 - 106 mmol/L 100  102  104   CO2 20 - 29 mmol/L 20  25  27    Calcium 8.7 - 10.2 mg/dL 9.3  9.1  8.9   Total Protein 6.0 - 8.5 g/dL 7.1  7.5    Total Bilirubin 0.0 - 1.2 mg/dL <0.2  0.6    Alkaline Phos 44 - 121 IU/L 69  58    AST 0 - 40 IU/L 22  19    ALT 0 - 32 IU/L 15  18       Radiologic Studies:  No recent  abdominal imaging  Assessment: Encounter Diagnoses  Name Primary?   Nausea and vomiting in adult Yes   Chronic constipation    Epigastric pain     Somewhat difficult to characterize symptoms, there is upper abdominal pain with nausea and vomiting, though the latter is the most prominent thing bothering her.  Chronic constipation seems likely due to ondansetron side effect.  Labs a few months back without anemia or elevated LFTs.  Similar symptoms in 2020 with normal upper endoscopy and negative H. pylori biopsy.   Plan:  Right upper quadrant ultrasound to evaluate for gallstones  Gastric emptying study  Change Zofran to Compazine to relieve constipation side effect.  Clinic visit follow-up 4 to 6 weeks  Thank you for the courtesy of this consult.  Please call me with any questions or concerns.  Nelida Meuse III  CC: Referring provider noted above

## 2022-10-30 NOTE — Patient Instructions (Signed)
_______________________________________________________  If your blood pressure at your visit was 140/90 or greater, please contact your primary care physician to follow up on this.  _______________________________________________________  If you are age 34 or older, your body mass index should be between 23-30. Your Body mass index is 46.62 kg/m. If this is out of the aforementioned range listed, please consider follow up with your Primary Care Provider.  If you are age 90 or younger, your body mass index should be between 19-25. Your Body mass index is 46.62 kg/m. If this is out of the aformentioned range listed, please consider follow up with your Primary Care Provider.   ________________________________________________________  The [facility] providers would like to encourage you to use [facility] to communicate with providers for non-urgent requests or questions.  Due to long hold times on the telephone, sending your provider a message by [facility] may be a faster and more efficient way to get a response.  Please allow 48 business hours for a response.  Please remember that this is for non-urgent requests.  _______________________________________________________  Dennis Bast have been scheduled for an abdominal ultrasound at [facility] (1st floor of hospital) on 11-04-2022 at 8:30am. Please arrive 30 minutes prior to your appointment for registration. Make certain not to have anything to eat or drink 6 hours prior to your appointment. Should you need to reschedule your appointment, please contact radiology at [phone]. This test typically takes about 30 minutes to perform.   You have been scheduled for a gastric emptying scan at [facility] on 11-17-2022 at 8am. Please arrive at least 30 minutes prior to your appointment for registration. Please make certain not to have anything to eat or drink after midnight the night before your test. Hold all stomach medications (ex: Zofran,  phenergan, Reglan) 24 hours prior to your test. If you need to reschedule your appointment, please contact radiology scheduling at [phone]. _____________________________________________________________________ A gastric-emptying study measures how long it takes for food to move through your stomach. There are several ways to measure stomach emptying. In the most common test, you eat food that contains a small amount of radioactive material. A scanner that detects the movement of the radioactive material is placed over your abdomen to monitor the rate at which food leaves your stomach. This test normally takes about 4 hours to complete. _____________________________________________________________________   Due to recent changes in healthcare laws, you may see the results of your imaging and laboratory studies on MyChart before your provider has had a chance to review them.  We understand that in some cases there may be results that are confusing or concerning to you. Not all laboratory results come back in the same time frame and the provider may be waiting for multiple results in order to interpret others.  Please give [name] 48 hours in order for your provider to thoroughly review all the results before contacting the office for clarification of your results.   It was a pleasure to see you today!  Thank you for trusting me with your gastrointestinal care!

## 2022-11-04 ENCOUNTER — Ambulatory Visit (HOSPITAL_COMMUNITY): Payer: Medicaid Other

## 2022-11-06 ENCOUNTER — Telehealth: Payer: Self-pay | Admitting: Diagnostic Neuroimaging

## 2022-11-06 NOTE — Telephone Encounter (Signed)
Called pt. Left a vm message to please schedule appointment.

## 2022-11-06 NOTE — Progress Notes (Signed)
Per Dr Mamie Nick request, can we contact patient to get her seen in office for headaches? Thanks

## 2022-11-06 NOTE — Telephone Encounter (Signed)
Noted  

## 2022-11-10 ENCOUNTER — Ambulatory Visit (HOSPITAL_COMMUNITY)
Admission: RE | Admit: 2022-11-10 | Discharge: 2022-11-10 | Disposition: A | Discharge status: Home / Self Care | Payer: Medicaid Other | Source: Ambulatory Visit | Attending: Gastroenterology | Admitting: Gastroenterology

## 2022-11-10 DIAGNOSIS — R112 Nausea with vomiting, unspecified: Secondary | ICD-10-CM | POA: Insufficient documentation

## 2022-11-10 DIAGNOSIS — K5909 Other constipation: Secondary | ICD-10-CM

## 2022-11-10 DIAGNOSIS — R1013 Epigastric pain: Secondary | ICD-10-CM | POA: Diagnosis present

## 2022-11-17 ENCOUNTER — Other Ambulatory Visit (HOSPITAL_COMMUNITY): Payer: Medicaid Other

## 2022-11-17 ENCOUNTER — Ambulatory Visit (INDEPENDENT_AMBULATORY_CARE_PROVIDER_SITE_OTHER): Payer: Medicaid Other | Admitting: Diagnostic Neuroimaging

## 2022-11-17 ENCOUNTER — Telehealth: Payer: Self-pay | Admitting: Diagnostic Neuroimaging

## 2022-11-17 ENCOUNTER — Telehealth: Payer: Self-pay | Admitting: *Deleted

## 2022-11-17 ENCOUNTER — Encounter: Payer: Self-pay | Admitting: Diagnostic Neuroimaging

## 2022-11-17 VITALS — BP 123/80 | HR 86 | Ht 59.0 in | Wt 230.0 lb

## 2022-11-17 DIAGNOSIS — R519 Headache, unspecified: Secondary | ICD-10-CM | POA: Diagnosis not present

## 2022-11-17 MED ORDER — RIZATRIPTAN BENZOATE 10 MG PO TBDP: 10.0000 mg | ORAL_TABLET | ORAL | 11 refills | Status: DC | PRN

## 2022-11-17 MED ORDER — AIMOVIG 70 MG/ML ~~LOC~~ SOAJ: 70.0000 mg | SUBCUTANEOUS | 4 refills | Status: DC

## 2022-11-17 NOTE — Progress Notes (Signed)
[facility]  PATIENT: Katelyn Mcfarland DOB: [birth year]  REFERRING CLINICIAN: [name], DO HISTORY FROM: patient  REASON FOR VISIT: follow up   HISTORICAL  CHIEF COMPLAINT:  Chief Complaint  Patient presents with   Follow-up    RM 7 alone Pt is well, reports she has about 2 migraines a week.     HISTORY OF PRESENT ILLNESS:   UPDATE (11/17/22, VRP): Since last visit, doing better (now 2 migraine per week). Symptoms are improving but not resolved. No alleviating or aggravating factors. Tolerating meds, but ran out of refills. Did not get MRI brain.   PRIOR HPI: 32 year old female here for evaluation of headaches.  Patient started having headaches in middle school with global throbbing sensation, neck pain, eye pain, nausea and photophobia.  Sometimes she will see black spots.  Headaches can last hours at a time.  She was diagnosed with migraine headaches and treated with ibuprofen.  She has had headaches throughout the years almost on daily basis.  Headaches seem to improve slightly for past 2 to 3 years when she was pregnant and breast-feeding.  In last 3 weeks patient has had increasing headaches.  She is also had some episodes of bilateral visual loss with severe headaches.  She is having almost 20 to 25 days of headache per month.  Headaches can be unilateral or global.  She went to the emergency room a couple weeks ago for evaluation.  Possibility of optic nerve edema was raised.  CT head was normal.  She followed up with ophthalmology who saw some mild scar tissue on the left eye but no acute or active findings.  Patient is having some chronic fatigue, sleep issues and stress issues.  REVIEW OF SYSTEMS: Full 14 system review of systems performed and negative with exception of: As per HPI.  ALLERGIES: No Known Allergies  HOME MEDICATIONS: Outpatient Medications Prior to Visit  Medication Sig Dispense Refill   acetaminophen (TYLENOL) 500 MG tablet Take  1,000 mg by mouth every 6 (six) hours as needed for mild pain (or headaches).     diclofenac Sodium (VOLTAREN) 1 % GEL Apply 2 g topically 4 (four) times daily. 350 g 0   Multiple Vitamin (MULTI VITAMIN PO) Take 1 capsule by mouth daily. Seamoss multivitamin daily     chlorhexidine (HIBICLENS) 4 % external liquid Apply topically daily as needed. (Patient not taking: Reported on 06/30/2022) 120 mL 0   fluticasone (FLONASE) 50 MCG/ACT nasal spray SHAKE LIQUID AND USE 2 SPRAYS IN EACH NOSTRIL DAILY (Patient not taking: Reported on 06/30/2022) 16 g 2   nicotine (NICODERM CQ - DOSED IN MG/24 HOURS) 14 mg/24hr patch Place 1 patch (14 mg total) onto the skin daily. (Patient not taking: Reported on 11/17/2022) 30 patch 3   nortriptyline (PAMELOR) 25 MG capsule Take 1 capsule (25 mg total) by mouth at bedtime. (Patient not taking: Reported on 07/30/2022) 30 capsule 3   ondansetron (ZOFRAN-ODT) 4 MG disintegrating tablet Take 1 tablet (4 mg total) by mouth every 8 (eight) hours as needed for nausea or vomiting. (Patient not taking: Reported on 11/17/2022) 30 tablet 1   prochlorperazine (COMPAZINE) 10 MG tablet Take 1 tablet (10 mg total) by mouth every 8 (eight) hours as needed for nausea or vomiting. (Patient not taking: Reported on 11/17/2022) 30 tablet 2   propranolol (INDERAL) 40 MG tablet Take 1 tablet (40 mg total) by mouth 2 (two) times daily. (Patient not taking: Reported on 06/30/2022) 60 tablet 6  rizatriptan (MAXALT-MLT) 10 MG disintegrating tablet Take 1 tablet (10 mg total) by mouth as needed for migraine. May repeat in 2 hours if needed (Patient not taking: Reported on 10/30/2022) 9 tablet 11   No facility-administered medications prior to visit.    PAST MEDICAL HISTORY: Past Medical History:  Diagnosis Date   Allergy    Chlamydia    age 44yo; hx/o trichomonas age 74yo   Chronic headache    Depression    doing ok, declined meds   High cholesterol    Infection    UTI   Pregnancy induced  hypertension    Trichomonas vaginalis infection    Vaginal Pap smear, abnormal    bx, cryo    PAST SURGICAL HISTORY: Past Surgical History:  Procedure Laterality Date   INDUCED ABORTION     TUBAL LIGATION Bilateral 11/15/2018   FAILED Procedure: POST PARTUM TUBAL LIGATION;  Surgeon: Chancy Milroy, MD;  Location: [place];  Service: Gynecology;  Laterality: Bilateral;   WISDOM TOOTH EXTRACTION      FAMILY HISTORY: Family History  Problem Relation Age of Onset   Hypertension Mother    Diabetes Mother    Asthma Sister    Cancer Paternal Grandmother    Cancer Paternal Grandfather    Cancer Maternal Grandmother    Kidney disease Maternal Grandmother    Diabetes Maternal Grandmother    Heart disease Neg Hx    Stroke Neg Hx     SOCIAL HISTORY: Social History   Socioeconomic History   Marital status: Single    Spouse name: Not on file   Number of children: 4   Years of education: Not on file   Highest education level: Not on file  Occupational History   Occupation: [employer]   Occupation: Contract CNA  Tobacco Use   Smoking status: Some Days    Packs/day: 1.00    Years: 16.00    Total pack years: 16.00    Types: Cigarettes    Start date: 10/13/2000   Smokeless tobacco: Never   Tobacco comments:    stopped August 2020, restarted smoking 05/2022 1/2ppd  Vaping Use   Vaping Use: Never used  Substance and Sexual Activity   Alcohol use: Not Currently    Comment: not currently, socially   Drug use: Not Currently    Types: Marijuana    Comment: last was october 2020   Sexual activity: Not Currently    Birth control/protection: Surgical    Comment: tubal ligation  Other Topics Concern   Not on file  Social History Narrative   Single,  children, exercise - none.  Work - dietary aid    1 cup of coffee daily, 1 red bull daily   Limited soda - day 6 of no soda   Social Determinants of Radio broadcast assistant Strain: Not on file  Food  Insecurity: Not on file  Transportation Needs: Not on file  Physical Activity: Not on file  Stress: Not on file  Social Connections: Not on file  Intimate Partner Violence: Not At Risk (06/25/2018)   Humiliation, Afraid, Rape, and Kick questionnaire    Fear of Current or Ex-Partner: No    Emotionally Abused: No    Physically Abused: No    Sexually Abused: No     PHYSICAL EXAM  GENERAL EXAM/CONSTITUTIONAL: Vitals:  Vitals:   11/17/22 1608  BP: 123/80  Pulse: 86  Weight: 230 lb (104.3 kg)  Height: 4\' 11"  (1.499 m)  Body mass index is 46.45 kg/m. Wt Readings from Last 3 Encounters:  11/17/22 230 lb (104.3 kg)  10/30/22 230 lb 12.8 oz (104.7 kg)  08/11/22 230 lb 8 oz (104.6 kg)   GENERAL MALAISE APPEARANCE; well developed, nourished and groomed; neck is supple  CARDIOVASCULAR: Examination of carotid arteries is normal; no carotid bruits Regular rate and rhythm, no murmurs Examination of peripheral vascular system by observation and palpation is normal  EYES: Ophthalmoscopic exam of optic discs and posterior segments is normal; no DEFINITE papilledema or hemorrhages No results found.  MUSCULOSKELETAL: Gait, strength, tone, movements noted in Neurologic exam below  NEUROLOGIC: MENTAL STATUS:      No data to display         awake, alert, oriented to person, place and time recent and remote memory intact normal attention and concentration language fluent, comprehension intact, naming intact fund of knowledge appropriate  CRANIAL NERVE:  2nd - no DEFINITE papilledema on fundoscopic exam; MILD PHOTOPHOBIA 2nd, 3rd, 4th, 6th - pupils equal and reactive to light, visual fields full to confrontation, extraocular muscles intact, no nystagmus 5th - facial sensation symmetric 7th - facial strength symmetric 8th - hearing intact 9th - palate elevates symmetrically, uvula midline 11th - shoulder shrug symmetric 12th - tongue protrusion midline  MOTOR:  normal bulk  and tone, full strength in the BUE, BLE  SENSORY:  normal and symmetric to light touch  COORDINATION:  finger-nose-finger, fine finger movements normal  REFLEXES:  deep tendon reflexes present and symmetric  GAIT/STATION:  narrow based gait     DIAGNOSTIC DATA (LABS, IMAGING, TESTING) - I reviewed patient records, labs, notes, testing and imaging myself where available.  Lab Results  Component Value Date   WBC 9.3 07/24/2021   HGB 12.7 07/24/2021   HCT 39.5 07/24/2021   MCV 85.7 07/24/2021   PLT 249 07/24/2021      Component Value Date/Time   NA 140 07/14/2022 1218   K 3.7 07/14/2022 1218   CL 100 07/14/2022 1218   CO2 20 07/14/2022 1218   GLUCOSE 77 07/14/2022 1218   GLUCOSE 103 (H) 07/24/2021 1234   BUN 10 07/14/2022 1218   CREATININE 0.99 07/14/2022 1218   CREATININE 0.77 11/01/2013 1509   CALCIUM 9.3 07/14/2022 1218   PROT 7.1 07/14/2022 1218   ALBUMIN 4.3 07/14/2022 1218   AST 22 07/14/2022 1218   ALT 15 07/14/2022 1218   ALKPHOS 69 07/14/2022 1218   BILITOT <0.2 07/14/2022 1218   GFRNONAA >60 07/24/2021 1234   GFRAA >60 04/16/2020 1931   Lab Results  Component Value Date   CHOL 204 (H) 10/17/2021   HDL 44 10/17/2021   LDLCALC 141 (H) 10/17/2021   TRIG 107 10/17/2021   CHOLHDL 4.6 (H) 10/17/2021   Lab Results  Component Value Date   HGBA1C 6.0 (H) 07/14/2022   Lab Results  Component Value Date   JQBHALPF79 024 05/23/2021   Lab Results  Component Value Date   TSH 3.190 07/14/2022    05/22/21 CT head [I reviewed images myself and agree with interpretation. -VRP]  - No acute intracranial findings.   ASSESSMENT AND PLAN  32 y.o. year old female here with headaches since middle school with migraine features.  Also with increasing headaches recently associated with transient visual loss and some optic nerve abnormalities.  We will proceed with further work-up with MRI of the brain and may consider LP to rule out pseudotumor cerebri.  Also will  start migraine treatments.  Meds tried: topiramate (side effects), propranolol, ibuprofen, tylenol  Dx:  1. New onset headache     PLAN:  NEW ONSET HEADACHE, VISION LOSS - possible mild papilledema; eval and rule out IIH; follow up with ophthalmology - check MRI brain; then consider LP  MIGRAINE PREVENTION  LIFESTYLE CHANGES -Stop or avoid smoking -Decrease or avoid caffeine / alcohol -Eat and sleep on a regular schedule -Exercise several times per week - erenumab (Aimovig) 70mg  monthly (may increase to 140mg  monthly)  MIGRAINE RESCUE  - ibuprofen, tylenol as needed - rizatriptan (Maxalt) 10mg  as needed for breakthrough headache; may repeat x 1 after 2 hours; max 2 tabs per day or 8 per month - consider rimegepant (Nurtec) 75mg  as needed for breakthrough headache; max 8 per month  Orders Placed This Encounter  Procedures   MR BRAIN W WO CONTRAST   Meds ordered this encounter  Medications   Erenumab-aooe (AIMOVIG) 70 MG/ML SOAJ    Sig: Inject 70 mg into the skin every 30 (thirty) days.    Dispense:  3 mL    Refill:  4   rizatriptan (MAXALT-MLT) 10 MG disintegrating tablet    Sig: Take 1 tablet (10 mg total) by mouth as needed for migraine. May repeat in 2 hours if needed    Dispense:  9 tablet    Refill:  11   Return in about 6 months (around 05/18/2023) for with NP.    , MD 11/17/2022, 4:30 PM Certified in Neurology, Neurophysiology and Neuroimaging  [facility] [address] [place], 01/16/2023 [facility] [phone]

## 2022-11-17 NOTE — Telephone Encounter (Signed)
Pt needs a copy of her results of quantiferon and her flu shot on letterhead.  Letter created so that she may print from [place].

## 2022-11-17 NOTE — Telephone Encounter (Signed)
[place] med[place] NPR sent to [facility] [phone] 

## 2022-11-17 NOTE — Patient Instructions (Addendum)
NEW ONSET HEADACHE, VISION LOSS - possible mild papilledema; follow up with ophthalmology - check MRI brain; then consider LP  MIGRAINE PREVENTION  LIFESTYLE CHANGES -Stop or avoid smoking -Decrease or avoid caffeine / alcohol -Eat and sleep on a regular schedule -Exercise several times per week - erenumab (Aimovig) 70mg  monthly (may increase to 140mg  monthly)   MIGRAINE RESCUE  - ibuprofen, tylenol as needed - rizatriptan (Maxalt) 10mg  as needed for breakthrough headache; may repeat x 1 after 2 hours; max 2 tabs per day or 8 per month

## 2022-11-24 ENCOUNTER — Encounter (HOSPITAL_COMMUNITY)
Admission: RE | Admit: 2022-11-24 | Discharge: 2022-11-24 | Disposition: A | Discharge status: Home / Self Care | Payer: Medicaid Other | Source: Ambulatory Visit | Attending: Gastroenterology | Admitting: Gastroenterology

## 2022-11-24 DIAGNOSIS — R1013 Epigastric pain: Secondary | ICD-10-CM | POA: Insufficient documentation

## 2022-11-24 DIAGNOSIS — K5909 Other constipation: Secondary | ICD-10-CM | POA: Diagnosis present

## 2022-11-24 DIAGNOSIS — R112 Nausea with vomiting, unspecified: Secondary | ICD-10-CM | POA: Insufficient documentation

## 2022-11-24 MED ORDER — TECHNETIUM TC 99M SULFUR COLLOID
2.2000 | Freq: Once | INTRAVENOUS | Status: AC
  Administered 2022-11-24: 2.2 via ORAL

## 2022-12-01 ENCOUNTER — Ambulatory Visit (INDEPENDENT_AMBULATORY_CARE_PROVIDER_SITE_OTHER): Payer: Medicaid Other | Admitting: Family Medicine

## 2022-12-01 VITALS — HR 100 | Ht 59.0 in | Wt 232.4 lb

## 2022-12-01 DIAGNOSIS — R0981 Nasal congestion: Secondary | ICD-10-CM | POA: Diagnosis present

## 2022-12-01 DIAGNOSIS — R45851 Suicidal ideations: Secondary | ICD-10-CM

## 2022-12-01 DIAGNOSIS — M940 Chondrocostal junction syndrome [Tietze]: Secondary | ICD-10-CM | POA: Diagnosis not present

## 2022-12-01 MED ORDER — FLUTICASONE PROPIONATE 50 MCG/ACT NA SUSP: 2.0000 | Freq: Every day | NASAL | 6 refills | Status: DC

## 2022-12-01 NOTE — Assessment & Plan Note (Signed)
-  PHQ-9: 1 for question 9 reviewed and discussed. Patient has had depression since childhood, has had thoughts of harming herself in the past. Used to have them daily but now has then once a week which is significantly improved from prior. Denies SI now and denies plan, last had a plan more than a few months ago.  -988 resource provided

## 2022-12-01 NOTE — Progress Notes (Signed)
    SUBJECTIVE:   CHIEF COMPLAINT / HPI:   Patient presents with chest congestion since 4 days ago. Has productive cough, recently started smoking again which has worsened her cough. Feels very congested along with rhinorrhea. Denies chest pain or dyspnea. Sick contacts include her 34 year old daughter who had COVID but took a home COVID test which was negative. Daughter is now better and back to school for the last few days. Denies fever but feeling warm. Endorses chills as well. History of seasonal allergies.   OBJECTIVE:   Pulse 100   Ht 4' 11"$  (1.499 m)   Wt 232 lb 6 oz (105.4 kg)   SpO2 100%   BMI 46.93 kg/m   General: Patient tired appearing, in no acute distress. CV: RRR, no murmurs or gallops auscultated Resp: CTAB, no wheezing, rales or rhonchi noted MSK: chest tenderness noted along sternum upon deep palpation  ASSESSMENT/PLAN:   Costochondritis -chest soreness likely secondary to costochondritis from viral illness, no suspicion for cardiac etiology in the setting of cough and congestion -flonase prescribed -honey encouraged and supportive care discussed -strict ED and return precautions discussed   Suicidal ideation -PHQ-9: 1 for question 9 reviewed and discussed. Patient has had depression since childhood, has had thoughts of harming herself in the past. Used to have them daily but now has then once a week which is significantly improved from prior. Denies SI now and denies plan, last had a plan more than a few months ago.  -988 resource provided    Donney Dice, [place]

## 2022-12-01 NOTE — Patient Instructions (Addendum)
It was great seeing you today!  Today we discussed your congestion, it seems that you have chest soreness from costochondritis. It is from the congestion and cough. I have prescribed flonase, please use 1 spray a day. Honey will also help with the cough. Your chest soreness will improve after your viral symptoms improve.   If you have chest pain or shortness of breath then please go to the emergency department.   If you have thoughts of harming yourself, please call [phone].   Please follow up at your next scheduled appointment, if anything arises between now and then, please don't hesitate to contact our office.   Thank you for allowing [name] to be a part of your medical care!  Thank you, Dr. Larae Grooms  Also a reminder of our clinic's no-show policy. Please make sure to arrive at least 15 minutes prior to your scheduled appointment time. Please try to cancel before 24 hours if you are not able to make it. If you no-show for 2 appointments then you will be receiving a warning letter. If you no-show after 3 visits, then you may be at risk of being dismissed from our clinic. This is to ensure that everyone is able to be seen in a timely manner. Thank you, we appreciate your assistance with this!

## 2022-12-01 NOTE — Assessment & Plan Note (Addendum)
-  chest soreness likely secondary to costochondritis from viral illness, no suspicion for cardiac etiology in the setting of cough and congestion -flonase prescribed -honey encouraged and supportive care discussed -strict ED and return precautions discussed

## 2022-12-02 ENCOUNTER — Other Ambulatory Visit: Payer: Medicaid Other

## 2022-12-03 ENCOUNTER — Ambulatory Visit (INDEPENDENT_AMBULATORY_CARE_PROVIDER_SITE_OTHER): Payer: Medicaid Other | Admitting: Family Medicine

## 2022-12-03 VITALS — BP 118/85 | HR 80 | Ht 59.0 in | Wt 229.4 lb

## 2022-12-03 DIAGNOSIS — H9202 Otalgia, left ear: Secondary | ICD-10-CM

## 2022-12-03 DIAGNOSIS — H6123 Impacted cerumen, bilateral: Secondary | ICD-10-CM

## 2022-12-03 DIAGNOSIS — H9209 Otalgia, unspecified ear: Secondary | ICD-10-CM | POA: Insufficient documentation

## 2022-12-03 MED ORDER — DEBROX 6.5 % OT SOLN: 5.0000 [drp] | Freq: Two times a day (BID) | OTIC | 0 refills | Status: DC

## 2022-12-03 NOTE — Assessment & Plan Note (Signed)
-  symptoms likely secondary to congestion from viral illness -no signs of bacteria acute otitis media, no indication of antibiotics at this time which was discussed with patient -debrox prescribed for ear wax, advised against the use of Q-tips -consider ENT referral if patient continues to have ear infections in the future -strict return precautions discussed

## 2022-12-03 NOTE — Patient Instructions (Addendum)
It was great seeing you today!  Today we discussed your ear pain, your ears look good. They just have a lot of wax, I have prescribed debrox. Continue with flonase. I recommend using nasal saline as well to clear some of the congestion. Honey will help with the cough. If your symptoms get worse or you have not improved by Monday (2/26) then please schedule an appointment to be seen.   Please follow up at your next scheduled appointment, if anything arises between now and then, please don't hesitate to contact our office.   Thank you for allowing [name] to be a part of your medical care!  Thank you, Dr. Larae Grooms  Also a reminder of our clinic's no-show policy. Please make sure to arrive at least 15 minutes prior to your scheduled appointment time. Please try to cancel before 24 hours if you are not able to make it. If you no-show for 2 appointments then you will be receiving a warning letter. If you no-show after 3 visits, then you may be at risk of being dismissed from our clinic. This is to ensure that everyone is able to be seen in a timely manner. Thank you, we appreciate your assistance with this!

## 2022-12-03 NOTE — Progress Notes (Signed)
    SUBJECTIVE:   CHIEF COMPLAINT / HPI:   Patient presents for worsening congestion and left ear pain, she was seen 2 days ago for likely viral illness. Otherwise she denies any changes. She still is having congestion and cough. Denies shortness of breath. She has a history of recurrent ear infections and wanted to get checked today.  OBJECTIVE:   BP 118/85   Pulse 80   Ht 4' 11"$  (1.499 m)   Wt 229 lb 6 oz (104 kg)   SpO2 100%   BMI 46.33 kg/m   General: Patient tired appearing, in no acute distress. HEENT: PERRLA, moist mucous membranes, no tonsillar exudates or erythema noted, nonbulging TM bilaterally without drainage but presence of wax noted bilaterally with left >right CV: RRR, no murmurs or gallops auscultated Resp: CTAB, no wheezing, rales or rhonchi noted, no focal findings noted, breathing comfortably on room air without signs of respiratory distress  ASSESSMENT/PLAN:   Ear pain -symptoms likely secondary to congestion from viral illness -no signs of bacteria acute otitis media, no indication of antibiotics at this time which was discussed with patient -debrox prescribed for ear wax, advised against the use of Q-tips -consider ENT referral if patient continues to have ear infections in the future -strict return precautions discussed      Donney Dice, [place]

## 2022-12-16 ENCOUNTER — Ambulatory Visit: Payer: Medicaid Other | Admitting: Diagnostic Neuroimaging

## 2022-12-23 NOTE — Progress Notes (Unsigned)
  SUBJECTIVE:   CHIEF COMPLAINT / HPI:   Chest pain: started yesterday morning, feels dizzy and unable to catch her breath. Feels as if her heart is beating really fast and hard. On/off through the day, 7/10 at its worst. Pain feels like a tightening sensation. Nothing makes it worse nor better. States these episodes last a couple minutes at a time.The episodes this morning are about an hour apart. The difficulty breathing is only when the episodes. She has not taken anything for this chest pain. Pain is in the center of her chest, does not radiate. Notes she had this sensation years ago but no concerns were elicited after workup. She has had a cough for several months. Endorses difficulty in her relationship this past month.  PHQ-9 score of 12, negative question 9.  PERTINENT  PMH / PSH:   Patient Care Team: [name], DO as PCP - General (Family Medicine) OBJECTIVE:  BP 123/84   Pulse 82   Ht 4\' 11"  (1.499 m)   Wt 222 lb 6 oz (100.9 kg)   SpO2 100%   BMI 44.91 kg/m  General: Well-appearing, NAD CV: RRR, no murmurs auscultated Respiratory: CTAB, normal WOB, no wheezing or rhonchi MSK: Chest tenderness noted along top part of sternum and ribs 2-3 anteriorly  ASSESSMENT/PLAN:  Intercostal pain Assessment & Plan: EKG shows NSR, physical exam unremarkable.  Highly unlikely to be cardiac etiology, suspect continuation of costochondritis from prior visit or newly developed.  May also represent anxiety given recent relational troubles.  Recommend oral ibuprofen or Voltaren gel as needed.  Anxiety appears to be poorly managed given she is not on medication currently and I am unaware of therapy.  She does have bipolar 1 diagnosis as well but per patient, this was cleared at her last psychiatric appointment last year stating she did not have bipolar. Advised her to sign records release and I would be more than glad to follow up with her regarding anxiety.   Orders: -     EKG  12-Lead  Return in about 1 month (around 01/24/2023) for Anxiety. [name], DO 12/24/2022, 1:46 PM PGY-2, [place]

## 2022-12-24 ENCOUNTER — Ambulatory Visit (INDEPENDENT_AMBULATORY_CARE_PROVIDER_SITE_OTHER): Payer: Medicaid Other | Admitting: Student

## 2022-12-24 ENCOUNTER — Encounter: Payer: Self-pay | Admitting: Student

## 2022-12-24 ENCOUNTER — Ambulatory Visit (HOSPITAL_COMMUNITY)
Admission: RE | Admit: 2022-12-24 | Discharge: 2022-12-24 | Disposition: A | Discharge status: Home / Self Care | Payer: Medicaid Other | Source: Ambulatory Visit | Attending: Family Medicine | Admitting: Family Medicine

## 2022-12-24 ENCOUNTER — Ambulatory Visit
Admission: RE | Admit: 2022-12-24 | Discharge: 2022-12-24 | Disposition: A | Discharge status: Home / Self Care | Payer: Medicaid Other | Source: Ambulatory Visit | Attending: Diagnostic Neuroimaging | Admitting: Diagnostic Neuroimaging

## 2022-12-24 VITALS — BP 123/84 | HR 82 | Ht 59.0 in | Wt 222.4 lb

## 2022-12-24 DIAGNOSIS — R079 Chest pain, unspecified: Secondary | ICD-10-CM | POA: Insufficient documentation

## 2022-12-24 DIAGNOSIS — R0782 Intercostal pain: Secondary | ICD-10-CM | POA: Diagnosis present

## 2022-12-24 DIAGNOSIS — R519 Headache, unspecified: Secondary | ICD-10-CM

## 2022-12-24 MED ORDER — GADOPICLENOL 0.5 MMOL/ML IV SOLN
10.0000 mL | Freq: Once | INTRAVENOUS | Status: AC | PRN
  Administered 2022-12-24: 10 mL via INTRAVENOUS

## 2022-12-24 NOTE — Patient Instructions (Signed)
It was great to see you today! Thank you for choosing [facility] for your primary care. Katelyn Mcfarland was seen for chest pain.  Today we addressed: Your EKG and physical exam (including vitals) are unremarkable.  I am not concerned this is your heart or lungs.  Given the episodic nature, anxiety is the likely culprit here.  I would recommend returning for discussion about relational troubles and how we can best assist with this or other anxiety spectrum causes.  If you haven't already, sign up for My Chart to have easy access to your labs results, and communication with your primary care physician.  Call the clinic at [phone] if your symptoms worsen or you have any concerns.  You should return to our clinic Return if symptoms worsen or fail to improve. Please arrive 15 minutes before your appointment to ensure smooth check in process.  We appreciate your efforts in making this happen.  Thank you for allowing me to participate in your care, Katelyn Guiles, DO 12/24/2022, 11:17 AM PGY-2, [place]

## 2022-12-24 NOTE — Assessment & Plan Note (Addendum)
EKG shows NSR, physical exam unremarkable.  Highly unlikely to be cardiac etiology, suspect continuation of costochondritis from prior visit or newly developed.  May also represent anxiety given recent relational troubles.  Recommend oral ibuprofen or Voltaren gel as needed.  Anxiety appears to be poorly managed given she is not on medication currently and I am unaware of therapy.  She does have bipolar 1 diagnosis as well but per patient, this was cleared at her last psychiatric appointment last year stating she did not have bipolar. Advised her to sign records release and I would be more than glad to follow up with her regarding anxiety.

## 2023-01-06 ENCOUNTER — Encounter: Payer: Self-pay | Admitting: Neurology

## 2023-01-06 DIAGNOSIS — H4710 Unspecified papilledema: Secondary | ICD-10-CM

## 2023-01-06 DIAGNOSIS — G43109 Migraine with aura, not intractable, without status migrainosus: Secondary | ICD-10-CM

## 2023-01-06 NOTE — Addendum Note (Signed)
Addended by: Darleen Crocker on: 01/06/2023 04:19 PM   Modules accepted: Orders

## 2023-01-08 ENCOUNTER — Telehealth: Payer: Self-pay | Admitting: Diagnostic Neuroimaging

## 2023-01-08 NOTE — Telephone Encounter (Signed)
Referral sent to  [facility].  [address] [place], [place] [address] Main: [phone]  Fax: [phone]

## 2023-01-17 NOTE — Progress Notes (Unsigned)
    SUBJECTIVE:   Chief compliant/HPI: annual examination  Katelyn Mcfarland is a 32 y.o. who presents today for an annual exam.   STI check - recently became sexually active with a new partner, wants to be checked for STIs. - preferred gender of partner: male - Sexually active with 1 partner(s) - Last sexual encounter: 01/05/23 - Contraception: Nexplanon, tubal ligation Symptoms include: Abnormal vaginal discharge and Foul odor   Updated history tabs and problem list.   OBJECTIVE:  BP 108/64   Pulse 88   Wt 222 lb 3.2 oz (100.8 kg)   SpO2 98%   BMI 44.88 kg/m   General: Well-appearing, NAD HEENT: No thyromegaly or cervical lymphadenopathy CV: RRR, no murmurs auscultated Pulm: CTAB, normal WOB Abdomen: Soft, obese abdomen, normoactive bowel sounds Pelvic exam: VULVA: normal appearing vulva with no masses, tenderness or lesions, VAGINA: vaginal discharge - white and creamy, CERVIX: normal appearing cervix without discharge or lesions, WET MOUNT done - results: trichomonads.  Chaperone present: Clemencia Course  ASSESSMENT/PLAN:  Encounter for annual physical exam Assessment & Plan: Annual Examination  See AVS for age appropriate recommendations.   PHQ score 5, reviewed and discussed. Blood pressure reviewed and at goal.  Asked about intimate partner violence and patient reports no concerns.  The patient currently uses tubal ligation and nexplanon for contraception. Folate recommended as appropriate, minimum of 400 mcg per day.  Advanced directives not discussed  Considered the following items based upon USPSTF recommendations: HIV testing: ordered Hepatitis C: ordered Hepatitis B: ordered Syphilis if at high risk: ordered GC/CT at high risk, ordered.  Lipid panel (nonfasting or fasting) discussed based upon AHA recommendations and not ordered.  Checked in 2023. Consider repeat every 4-6 years.  Reviewed risk factors for latent tuberculosis and not indicated  Discussed  family history, BRCA testing not discussed. Father's side had breast cancer. Father's mom also potentially had ovarian cancer.  Cervical cancer screening: prior Pap reviewed, repeat due in 2028 Immunizations up to date.    Trichomoniasis Assessment & Plan: Positive on wet prep.  Rx for Flagyl.  Patient was able to provide partner information for expedited partner therapy.  Will test patient for other STDs.  Orders: -     metroNIDAZOLE; Take 1 tablet (500 mg total) by mouth 2 (two) times daily for 7 days.  Dispense: 14 tablet; Refill: 0  Screening for STD (sexually transmitted disease) -     POCT Wet Prep [facility]) -     Cervicovaginal ancillary only -     HIV Antibody (routine testing w rflx) -     RPR -     Hepatitis C antibody -     Hepatitis B surface antigen  Return in about 5 weeks (around 02/23/2023).   Shelby Mattocks, DO [facility]

## 2023-01-19 ENCOUNTER — Encounter: Payer: Self-pay | Admitting: Student

## 2023-01-19 ENCOUNTER — Telehealth: Payer: Self-pay | Admitting: Diagnostic Neuroimaging

## 2023-01-19 ENCOUNTER — Encounter: Payer: Medicaid Other | Admitting: Student

## 2023-01-19 ENCOUNTER — Other Ambulatory Visit (HOSPITAL_COMMUNITY)
Admission: RE | Admit: 2023-01-19 | Discharge: 2023-01-19 | Disposition: A | Discharge status: Home / Self Care | Payer: Medicaid Other | Source: Ambulatory Visit | Attending: Family Medicine | Admitting: Family Medicine

## 2023-01-19 ENCOUNTER — Ambulatory Visit (INDEPENDENT_AMBULATORY_CARE_PROVIDER_SITE_OTHER): Payer: Medicaid Other | Admitting: Student

## 2023-01-19 VITALS — BP 108/64 | HR 88 | Wt 222.2 lb

## 2023-01-19 DIAGNOSIS — Z113 Encounter for screening for infections with a predominantly sexual mode of transmission: Secondary | ICD-10-CM

## 2023-01-19 DIAGNOSIS — A599 Trichomoniasis, unspecified: Secondary | ICD-10-CM | POA: Insufficient documentation

## 2023-01-19 DIAGNOSIS — Z0000 Encounter for general adult medical examination without abnormal findings: Secondary | ICD-10-CM | POA: Diagnosis present

## 2023-01-19 DIAGNOSIS — G932 Benign intracranial hypertension: Secondary | ICD-10-CM

## 2023-01-19 LAB — POCT WET PREP (WET MOUNT): Clue Cells Wet Prep Whiff POC: NEGATIVE

## 2023-01-19 MED ORDER — METRONIDAZOLE 500 MG PO TABS
500.0000 mg | ORAL_TABLET | Freq: Two times a day (BID) | ORAL | 0 refills | Status: AC
Start: 2023-01-19 — End: 2023-01-26

## 2023-01-19 NOTE — Assessment & Plan Note (Signed)
Annual Examination  See AVS for age appropriate recommendations.   PHQ score 5, reviewed and discussed. Blood pressure reviewed and at goal.  Asked about intimate partner violence and patient reports no concerns.  The patient currently uses tubal ligation and nexplanon for contraception. Folate recommended as appropriate, minimum of 400 mcg per day.  Advanced directives not discussed  Considered the following items based upon USPSTF recommendations: HIV testing: ordered Hepatitis C: ordered Hepatitis B: ordered Syphilis if at high risk: ordered GC/CT at high risk, ordered.  Lipid panel (nonfasting or fasting) discussed based upon AHA recommendations and not ordered.  Checked in 2023. Consider repeat every 4-6 years.  Reviewed risk factors for latent tuberculosis and not indicated  Discussed family history, BRCA testing not discussed. Father's side had breast cancer. Father's mom also potentially had ovarian cancer.  Cervical cancer screening: prior Pap reviewed, repeat due in 2028 Immunizations up to date.

## 2023-01-19 NOTE — Telephone Encounter (Signed)
Pt stated she is following-up on LB puncture. Stated her PCP told her to follow-up with Dr. Marjory Lies.

## 2023-01-19 NOTE — Patient Instructions (Signed)
It was great to see you today! Thank you for choosing [facility] for your primary care. Katelyn Mcfarland was seen for annual physical.  Today we addressed: I prescribed you metronidazole 500 mg twice daily x 7 days for trichomonas.  Please contact us with further information of your partner if he would like treatment or he should see his PCP.  You will need a test of cure 4 weeks after completing treatment.  Please do not have any sexual intercourse while taking treatment or 1 week afterwards as the risk of transmitting it to others. Please let us know if you would like further assistance in quitting smoking.  Things to do to Keep yourself Healthy - Exercise at least 30-45 minutes a day, 3-4 days a week. >150 min of moderate intensity per week is advised. - Eat a low-fat diet with lots of fruits and vegetables, up to 7-9 servings per day. - Seatbelts can save your life. Wear them always. - Smoke detectors on every level of your home, check batteries every year. - Eye Doctor - have an eye exam every 1-2 years - Safe sex - if you may be exposed to STDs, use a condom. - Alcohol If you drink, do it moderately, less than 1 drink per day. - Health Care Power of Attorney.  Choose someone to speak for you if you are not able. - Depression is common in our stressful world.If you're feeling down or losing interest in things you normally enjoy, please come in for a visit. - Violence - If anyone is threatening or hurting you, please call immediately.   If you haven't already, sign up for My Chart to have easy access to your labs results, and communication with your primary care physician.  We are checking some labs today. If they are abnormal, I will call you. If they are normal, I will send you a MyChart message (if it is active) or a letter in the mail. If you do not hear about your labs in the next 2 weeks, please call the office. I recommend that you always bring your medications to each  appointment as this makes it easy to ensure you are on the correct medications and helps [name] not miss refills when you need them.  You should return to our clinic Return in about 5 weeks (around 02/23/2023). Please arrive 15 minutes before your appointment to ensure smooth check in process.  We appreciate your efforts in making this happen.  Thank you for allowing me to participate in your care, Shelby Mattocks, DO 01/19/2023, 11:31 AM PGY-2, [facility]

## 2023-01-19 NOTE — Assessment & Plan Note (Signed)
Positive on wet prep.  Rx for Flagyl.  Patient was able to provide partner information for expedited partner therapy.  Will test patient for other STDs.

## 2023-01-20 LAB — CERVICOVAGINAL ANCILLARY ONLY
Chlamydia: NEGATIVE
Comment: NEGATIVE
Comment: NORMAL
Neisseria Gonorrhea: NEGATIVE

## 2023-01-20 LAB — RPR: RPR Ser Ql: NONREACTIVE

## 2023-01-20 LAB — HIV ANTIBODY (ROUTINE TESTING W REFLEX): HIV Screen 4th Generation wRfx: NONREACTIVE

## 2023-01-20 LAB — HEPATITIS C ANTIBODY: Hep C Virus Ab: NONREACTIVE

## 2023-01-20 LAB — HEPATITIS B SURFACE ANTIGEN: Hepatitis B Surface Ag: NEGATIVE

## 2023-01-20 NOTE — Telephone Encounter (Signed)
Please advise pt we did not order nor complete a lumbar puncture on her. We only did a MRI Brain

## 2023-01-21 NOTE — Telephone Encounter (Signed)
Called pt informed her of message [facility] sent. Pt said her eyes are still swelling and her PCP told her to get an order from Neurologist for a LB puncture because of the swelling.

## 2023-01-22 MED ORDER — ACETAZOLAMIDE 250 MG PO TABS: 250.0000 mg | ORAL_TABLET | Freq: Two times a day (BID) | ORAL | 12 refills | Status: DC

## 2023-01-22 NOTE — Telephone Encounter (Signed)
Called the patient and there was no answer. Will send a mychart message advising the next steps.

## 2023-01-22 NOTE — Addendum Note (Signed)
Addended by: Joycelyn Schmid R on: 01/22/2023 10:40 AM   Modules accepted: Orders

## 2023-01-22 NOTE — Telephone Encounter (Signed)
Meds ordered this encounter  Medications   acetaZOLAMIDE (DIAMOX) 250 MG tablet    Sig: Take 1 tablet (250 mg total) by mouth 2 (two) times daily.    Dispense:  60 tablet    Refill:  12    Orders Placed This Encounter  Procedures   DG FL GUIDED THERAPEUTIC LUMBAR PUNCTURE    Suanne Marker, MD 01/22/2023, 10:39 AM Certified in Neurology, Neurophysiology and Neuroimaging  [facility] [address], [address] [place], [place] [address] [phone]

## 2023-02-10 NOTE — Discharge Instructions (Signed)

## 2023-02-11 ENCOUNTER — Ambulatory Visit
Admission: RE | Admit: 2023-02-11 | Discharge: 2023-02-11 | Disposition: A | Discharge status: Home / Self Care | Payer: Medicaid Other | Source: Ambulatory Visit | Attending: Diagnostic Neuroimaging | Admitting: Diagnostic Neuroimaging

## 2023-02-11 ENCOUNTER — Encounter: Payer: Self-pay | Admitting: Diagnostic Neuroimaging

## 2023-02-11 VITALS — BP 146/90 | HR 75

## 2023-02-11 DIAGNOSIS — E669 Obesity, unspecified: Secondary | ICD-10-CM

## 2023-02-11 DIAGNOSIS — G932 Benign intracranial hypertension: Secondary | ICD-10-CM

## 2023-02-11 LAB — PROTEIN, CSF: Total Protein, CSF: 26 mg/dL (ref 15–45)

## 2023-02-11 LAB — CSF CELL COUNT WITH DIFFERENTIAL
RBC Count, CSF: 0 cells/uL
TOTAL NUCLEATED CELL: 0 cells/uL (ref 0–5)

## 2023-02-11 LAB — CSF CULTURE W GRAM STAIN: SPECIMEN QUALITY:: ADEQUATE

## 2023-02-15 LAB — CSF CULTURE W GRAM STAIN
MICRO NUMBER:: 14898612
Result:: NO GROWTH

## 2023-02-15 LAB — CSF CELL COUNT WITH DIFFERENTIAL

## 2023-02-15 LAB — GLUCOSE, CSF: Glucose, CSF: 66 mg/dL (ref 40–80)

## 2023-02-18 ENCOUNTER — Ambulatory Visit (INDEPENDENT_AMBULATORY_CARE_PROVIDER_SITE_OTHER): Payer: Medicaid Other | Admitting: Student

## 2023-02-18 ENCOUNTER — Encounter: Payer: Self-pay | Admitting: Student

## 2023-02-18 VITALS — BP 118/74 | HR 90 | Ht 59.0 in | Wt 220.0 lb

## 2023-02-18 DIAGNOSIS — A599 Trichomoniasis, unspecified: Secondary | ICD-10-CM | POA: Diagnosis not present

## 2023-02-18 DIAGNOSIS — R399 Unspecified symptoms and signs involving the genitourinary system: Secondary | ICD-10-CM | POA: Diagnosis not present

## 2023-02-18 LAB — POCT URINALYSIS DIP (MANUAL ENTRY)
Bilirubin, UA: NEGATIVE
Glucose, UA: NEGATIVE mg/dL
Ketones, POC UA: NEGATIVE mg/dL
Nitrite, UA: NEGATIVE
Protein Ur, POC: NEGATIVE mg/dL
Spec Grav, UA: 1.02 (ref 1.010–1.025)
Urobilinogen, UA: 0.2 E.U./dL
pH, UA: 7.5 (ref 5.0–8.0)

## 2023-02-18 LAB — POCT UA - MICROSCOPIC ONLY
Epithelial cells, urine per micros: >20
Trichomonas, UA: POSITIVE

## 2023-02-18 LAB — POCT WET PREP (WET MOUNT): Clue Cells Wet Prep Whiff POC: NEGATIVE

## 2023-02-18 MED ORDER — TINIDAZOLE 500 MG PO TABS
2.0000 g | ORAL_TABLET | Freq: Every day | ORAL | 0 refills | Status: AC
Start: 2023-02-18 — End: 2023-02-25

## 2023-02-18 NOTE — Progress Notes (Unsigned)
    SUBJECTIVE:   CHIEF COMPLAINT / HPI:   32 year old female presenting today for follow-up post trichomonas treatment.  Patient reports compliance to medication on including treatment of partner as well.  She stated having relieved after treatment however symptoms came back couple of days ago.  She was having intermittent discharge, orders and irritation with voiding.  Patient was treated 5 weeks ago metronidazole 500 mg twice daily for 1 week.   PERTINENT  PMH / PSH: ***  OBJECTIVE:   BP 118/74   Pulse 90   Ht 4\' 11"  (1.499 m)   Wt 220 lb (99.8 kg)   BMI 44.43 kg/m   ***  Physical Exam General: Alert, well appearing, NAD, Oriented x4 Cardiovascular: RRR, No Murmurs, Normal S2/S2 Respiratory: CTAB, No wheezing or Rales Abdomen: No distension or tenderness Extremities: No edema on extremities   Skin: Warm and dry  ASSESSMENT/PLAN:   No problem-specific Assessment & Plan notes found for this encounter.     Jerre Simon, MD [facility]   {    This will disappear when note is signed, click to select method of visit    :1}

## 2023-02-18 NOTE — Patient Instructions (Signed)
It was wonderful to see you today. Thank you for allowing me to be a part of your care. Below is a short summary of what we discussed at your visit today:  Today we obtained lab for UTI and STD testing.  Your urine sample was positive for trichomonas.  I am sending you prescription for resistant/recurrent trichomonas infection.  He will take Tinidazole 2g daily for 7 days.  Please avoid intercourse for at least 2 weeks.  Is very imperative that your partner is treated adequately as well.  Will need to follow-up in 5 weeks to reassess.  If you have any questions or concerns, please do not hesitate to contact us via phone or MyChart message.   Jerre Simon, MD [facility]

## 2023-02-19 ENCOUNTER — Encounter: Payer: Self-pay | Admitting: Student

## 2023-02-19 ENCOUNTER — Telehealth: Payer: Self-pay | Admitting: *Deleted

## 2023-02-19 NOTE — Assessment & Plan Note (Signed)
Wet prep is positive for Trich and patient is currently symptomatic. Unsure if patient was reinfected, inadequately treated or has a resistant strain. Will treat will tinidazole with plan for follow up. -Rx Tinidazole 2g daily for 7 days -Sent prescription for partner -Advised patient to abstain from intercourse during tx -Recommend follow up in 5 weeks for recheck

## 2023-02-19 NOTE — Telephone Encounter (Signed)
Pt reports that the tinidazole is not a covered med by Medicaid.  It will nned to be changed or prior approved.  To Rx'ing MD and Siri Cole. Jone Baseman, CMA

## 2023-02-20 NOTE — Telephone Encounter (Signed)
Pharmacy has been updated.  Copy 4 dollars.   Patient aware.

## 2023-02-20 NOTE — Telephone Encounter (Signed)
Prior Auth for patients medication TINIDAZOLE approved by MEDICAID from 02/20/23 to 03/22/23.

## 2023-02-20 NOTE — Telephone Encounter (Signed)
A Prior Authorization was initiated for this patients TINIDAZOLE through [employer].   Confirmation # [id number]

## 2023-03-14 NOTE — Progress Notes (Unsigned)
  SUBJECTIVE:   CHIEF COMPLAINT / HPI:   Presents for follow-up post trichomonas treatment x 2.  Last seen on 02/18/2023 in which she tested positive again for trichomonas and was prescribed tinidazole 2 g daily x 7 days.  PERTINENT  PMH / PSH: Trichomoniasis  Patient Care Team: Shelby Mattocks, DO as PCP - General (Family Medicine) OBJECTIVE:  There were no vitals taken for this visit. Physical Exam   ASSESSMENT/PLAN:  There are no diagnoses linked to this encounter. No follow-ups on file. Shelby Mattocks, DO 03/14/2023, 4:07 PM PGY-***, [facility] {    This will disappear when note is signed, click to select method of visit    :1}

## 2023-03-17 ENCOUNTER — Other Ambulatory Visit (HOSPITAL_COMMUNITY)
Admission: RE | Admit: 2023-03-17 | Discharge: 2023-03-17 | Disposition: A | Discharge status: Home / Self Care | Payer: Medicaid Other | Source: Ambulatory Visit | Attending: Family Medicine | Admitting: Family Medicine

## 2023-03-17 ENCOUNTER — Encounter: Payer: Self-pay | Admitting: Student

## 2023-03-17 ENCOUNTER — Ambulatory Visit (INDEPENDENT_AMBULATORY_CARE_PROVIDER_SITE_OTHER): Payer: Medicaid Other | Admitting: Student

## 2023-03-17 VITALS — BP 126/86 | HR 82 | Ht 59.0 in | Wt 219.0 lb

## 2023-03-17 DIAGNOSIS — M549 Dorsalgia, unspecified: Secondary | ICD-10-CM

## 2023-03-17 DIAGNOSIS — A599 Trichomoniasis, unspecified: Secondary | ICD-10-CM | POA: Diagnosis present

## 2023-03-17 DIAGNOSIS — G8929 Other chronic pain: Secondary | ICD-10-CM | POA: Diagnosis not present

## 2023-03-17 DIAGNOSIS — Z113 Encounter for screening for infections with a predominantly sexual mode of transmission: Secondary | ICD-10-CM | POA: Insufficient documentation

## 2023-03-17 LAB — POCT WET PREP (WET MOUNT)
Clue Cells Wet Prep Whiff POC: NEGATIVE
Trichomonas Wet Prep HPF POC: ABSENT
WBC, Wet Prep HPF POC: NONE SEEN

## 2023-03-17 NOTE — Assessment & Plan Note (Signed)
Wet prep negative.  Will await results from ancillary study.  Advised against boric acid use.  Continue to abstain from intercourse until confirmed negative.

## 2023-03-17 NOTE — Assessment & Plan Note (Signed)
Chronic, advised against consistent use of meloxicam.  She is not interested in pain management referral at this time although will let me know if she will be interested in the future.

## 2023-03-19 LAB — CERVICOVAGINAL ANCILLARY ONLY
Chlamydia: NEGATIVE
Comment: NEGATIVE
Comment: NEGATIVE
Comment: NORMAL
Neisseria Gonorrhea: NEGATIVE
Trichomonas: NEGATIVE

## 2023-03-28 NOTE — Progress Notes (Signed)
  SUBJECTIVE:   CHIEF COMPLAINT / HPI:   Endorses chest pain this morning, and went to [facility] ED. They gave her some Zofran and fluids. Notes her chest was still hurting when they discharged her. Asking for Cardiology referral today. Endorses 10/10 chest pain there, 7/10 now. Radiates to the center of her chest. Tightness and pressure, taking a deep breath makes it worse. Further endorses some numbness.  EKG was normal there, in addition to bloodwork. She did have an xray done which was normal.  Endorses discomfort when she urinates, color change. Denies vaginal discharge. She did complete her Tinidazole from prior appointment. Has not had any sexual intercourse since prior to last appointment. Fishy odor.   PERTINENT  PMH / PSH: Prediabetes, GERD, migraines, tobacco use  Patient Care Team: Shelby Mattocks, DO as PCP - General (Family Medicine) OBJECTIVE:  BP 120/70   Pulse 72   Ht 4\' 11"  (1.499 m)   Wt 219 lb (99.3 kg)   SpO2 92%   BMI 44.23 kg/m  Gen: NAD CV: RRR, no murmurs auscultated Pulm: CTAB, normal WOB Pelvic exam: VULVA: normal appearing vulva with no masses, tenderness or lesions, VAGINA: vaginal discharge - clear, white, and thin, CERVIX: normal appearing cervix without discharge or lesions, exam chaperoned by Jone Baseman.  ASSESSMENT/PLAN:  Dysuria Assessment & Plan: Unaware of results in the ED at [facility]. Will test again and treated as necessary.   Orders: -     POCT urinalysis dipstick -     POCT UA - Microscopic Only  Vaginal discharge -     Cervicovaginal ancillary only  Chest pain Received what sounds to be thorough workup at [facility] ED. Provided patient with reassurance, vitals remain stable. Requested records release signature to receive this information prior to placing Cardiology referral. Patient was amenable to this plan.   Return if symptoms worsen or fail to improve. Shelby Mattocks, DO 04/02/2023, 12:57 PM PGY-2,  Family  Medicine

## 2023-03-31 ENCOUNTER — Other Ambulatory Visit (HOSPITAL_COMMUNITY)
Admission: RE | Admit: 2023-03-31 | Discharge: 2023-03-31 | Disposition: A | Discharge status: Home / Self Care | Payer: Medicaid Other | Source: Ambulatory Visit | Attending: Family Medicine | Admitting: Family Medicine

## 2023-03-31 ENCOUNTER — Encounter: Payer: Self-pay | Admitting: Student

## 2023-03-31 ENCOUNTER — Ambulatory Visit (INDEPENDENT_AMBULATORY_CARE_PROVIDER_SITE_OTHER): Payer: Medicaid Other | Admitting: Student

## 2023-03-31 VITALS — BP 120/70 | HR 72 | Ht 59.0 in | Wt 219.0 lb

## 2023-03-31 DIAGNOSIS — R079 Chest pain, unspecified: Secondary | ICD-10-CM

## 2023-03-31 DIAGNOSIS — R300 Dysuria: Secondary | ICD-10-CM | POA: Diagnosis present

## 2023-03-31 DIAGNOSIS — N898 Other specified noninflammatory disorders of vagina: Secondary | ICD-10-CM | POA: Diagnosis present

## 2023-03-31 LAB — POCT UA - MICROSCOPIC ONLY: WBC, Ur, HPF, POC: NONE SEEN (ref 0–5)

## 2023-03-31 LAB — POCT URINALYSIS DIP (MANUAL ENTRY)
Bilirubin, UA: NEGATIVE
Glucose, UA: NEGATIVE mg/dL
Ketones, POC UA: NEGATIVE mg/dL
Leukocytes, UA: NEGATIVE
Nitrite, UA: NEGATIVE
Protein Ur, POC: NEGATIVE mg/dL
Spec Grav, UA: 1.02 (ref 1.010–1.025)
Urobilinogen, UA: 0.2 E.U./dL
pH, UA: 6 (ref 5.0–8.0)

## 2023-03-31 NOTE — Patient Instructions (Addendum)
It was great to see you today! Thank you for choosing [facility] for your primary care. Katelyn Mcfarland was seen for chest pressure and urinary concern.  Today we addressed: Before placing a referral to cardiology, I would like to see the workup they did at [facility] ED.  Please sign the records release for [name].  Reach out to me in 1 week if you have not heard from May. For the urinary and [name], her testing her urine and today vaginal swab.  Please wait to hear from me.  If you haven't already, sign up for My Chart to have easy access to your labs results, and communication with your primary care physician.  We are checking some labs today. If they are abnormal, I will call you. If they are normal, I will send you a MyChart message (if it is active) or a letter in the mail. If you do not hear about your labs in the next 2 weeks, please call the office. Call the clinic at [phone] if your symptoms worsen or you have any concerns.  You should return to our clinic Return if symptoms worsen or fail to improve. Please arrive 15 minutes before your appointment to ensure smooth check in process.  We appreciate your efforts in making this happen.  Thank you for allowing me to participate in your care, Shelby Mattocks, DO 03/31/2023, 3:38 PM PGY-2, [facility]

## 2023-04-01 LAB — CERVICOVAGINAL ANCILLARY ONLY
Bacterial Vaginitis (gardnerella): NEGATIVE
Candida Glabrata: NEGATIVE
Candida Vaginitis: NEGATIVE
Chlamydia: NEGATIVE
Comment: NEGATIVE
Comment: NEGATIVE
Comment: NEGATIVE
Comment: NEGATIVE
Comment: NEGATIVE
Comment: NORMAL
Neisseria Gonorrhea: NEGATIVE
Trichomonas: NEGATIVE

## 2023-04-02 NOTE — Assessment & Plan Note (Signed)
Unaware of results in the ED at [facility]. Will test again and treated as necessary.

## 2023-04-03 ENCOUNTER — Encounter: Payer: Self-pay | Admitting: Student

## 2023-04-06 ENCOUNTER — Telehealth: Payer: Self-pay

## 2023-04-06 NOTE — Telephone Encounter (Signed)
Patient calls nurse line requesting recent lab results.   She reports she saw the results on mychart, however wants to make sure she does not have a UTI.   Will forward to PCP.

## 2023-04-28 ENCOUNTER — Ambulatory Visit: Payer: MEDICAID

## 2023-04-28 ENCOUNTER — Emergency Department (HOSPITAL_COMMUNITY): Payer: Worker's Compensation

## 2023-04-28 ENCOUNTER — Other Ambulatory Visit: Payer: Self-pay

## 2023-04-28 ENCOUNTER — Emergency Department (HOSPITAL_COMMUNITY)
Admission: EM | Admit: 2023-04-28 | Discharge: 2023-04-28 | Disposition: A | Discharge status: Home / Self Care | Payer: Worker's Compensation | Attending: Emergency Medicine | Admitting: Emergency Medicine

## 2023-04-28 ENCOUNTER — Other Ambulatory Visit (HOSPITAL_COMMUNITY)
Admission: RE | Admit: 2023-04-28 | Discharge: 2023-04-28 | Disposition: A | Discharge status: Home / Self Care | Payer: MEDICAID | Source: Ambulatory Visit | Attending: Family Medicine | Admitting: Family Medicine

## 2023-04-28 ENCOUNTER — Encounter (HOSPITAL_COMMUNITY): Payer: Self-pay

## 2023-04-28 ENCOUNTER — Ambulatory Visit: Payer: Medicaid Other

## 2023-04-28 VITALS — BP 110/65 | HR 86 | Ht 59.0 in | Wt 218.2 lb

## 2023-04-28 DIAGNOSIS — S43401A Unspecified sprain of right shoulder joint, initial encounter: Secondary | ICD-10-CM | POA: Diagnosis not present

## 2023-04-28 DIAGNOSIS — X58XXXA Exposure to other specified factors, initial encounter: Secondary | ICD-10-CM | POA: Diagnosis not present

## 2023-04-28 DIAGNOSIS — R300 Dysuria: Secondary | ICD-10-CM

## 2023-04-28 DIAGNOSIS — N898 Other specified noninflammatory disorders of vagina: Secondary | ICD-10-CM | POA: Insufficient documentation

## 2023-04-28 DIAGNOSIS — M25511 Pain in right shoulder: Secondary | ICD-10-CM | POA: Diagnosis present

## 2023-04-28 NOTE — Patient Instructions (Addendum)
It was great to see you today! Thank you for choosing [facility] for your primary care. Katelyn Mcfarland was seen for follow up.  Today we addressed: Testing for yeast and BV as well as trichomonas again  We will also test for urinary tract infection    If you haven't already, sign up for My Chart to have easy access to your labs results, and communication with your primary care physician.  I recommend that you always bring your medications to each appointment as this makes it easy to ensure you are on the correct medications and helps [name] not miss refills when you need them. Call the clinic at [phone] if your symptoms worsen or you have any concerns.  You should return to our clinic Return in about 2 weeks (around 05/12/2023), or if symptoms worsen or fail to improve. Please arrive 15 minutes before your appointment to ensure smooth check in process.  We appreciate your efforts in making this happen.  Thank you for allowing me to participate in your care, Alfredo Martinez, MD 04/28/2023, 2:56 PM PGY-3, [facility]

## 2023-04-28 NOTE — Progress Notes (Unsigned)
  SUBJECTIVE:   CHIEF COMPLAINT / HPI:   Concerned about yeast - preferred gender of partner: male - Medications tried: none - Sexually active not since April - Symptoms include: Abnormal vaginal discharge and itching  - Feels itching on the vaginal wall and around the labia, thinks related to yeast.  - Has been present before trich treatment and has been present after treatment, has never gone away.  - Reports pain at the end of voiding   PERTINENT  PMH / PSH: Previously treated for trichomonas x2, tested 4 weeks ago and negative on ancillary testing   Patient Care Team: Shelby Mattocks, DO as PCP - General (Family Medicine) OBJECTIVE:  BP 110/65   Pulse 86   Ht 4\' 11"  (1.499 m)   Wt 218 lb 4 oz (99 kg)   SpO2 100%   BMI 44.08 kg/m  Physical Exam  General: NAD, pleasant, able to participate in exam Respiratory: No respiratory distress Skin: warm and dry, no rashes noted Psych: Normal affect and mood GU: Chaperoned by CMA Normal cervical and vaginal exam  Slight erythema over labia   ASSESSMENT/PLAN:  Dysuria Assessment & Plan: UA with reflex to Ucx if indicated.   Orders: -     Cervicovaginal ancillary only -     Mycoplasma / ureaplasma culture -     Urinalysis -     Candida 6 Species Profile, NAA  Vaginal itching Assessment & Plan: Ordered mycoplasma/ureaplasma, candida 6 species, wet prep with GC/Chlamydia, trichomonas. If all negative, consider premarin.   Orders: -     Cervicovaginal ancillary only -     Mycoplasma / ureaplasma culture -     Urinalysis -     Candida 6 Species Profile, NAA   Return in about 2 weeks (around 05/12/2023), or if symptoms worsen or fail to improve. Alfredo Martinez, MD 04/29/2023, 1:18 PM PGY-3, [facility]

## 2023-04-28 NOTE — Discharge Instructions (Signed)
Take 2 Tylenol and 2 ibuprofen every 6 hours as needed for pain.  Try ice or heat what ever makes it feel better.  Make sure you continue to range her shoulder so it does not become frozen.  Make sure you follow-up with the orthopedist by the end of this week or next week if your shoulder is not feeling any better.  Limit any heavy lifting.

## 2023-04-28 NOTE — ED Triage Notes (Signed)
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   Suspects she pulled a muscle and work is requiring medical evaluation.   Limited ROM.

## 2023-04-29 DIAGNOSIS — N898 Other specified noninflammatory disorders of vagina: Secondary | ICD-10-CM | POA: Insufficient documentation

## 2023-04-29 LAB — CERVICOVAGINAL ANCILLARY ONLY
Bacterial Vaginitis (gardnerella): NEGATIVE
Candida Glabrata: NEGATIVE
Candida Vaginitis: NEGATIVE
Chlamydia: NEGATIVE
Comment: NEGATIVE
Comment: NEGATIVE
Comment: NEGATIVE
Comment: NEGATIVE
Comment: NEGATIVE
Comment: NORMAL
Neisseria Gonorrhea: NEGATIVE
Trichomonas: NEGATIVE

## 2023-04-29 NOTE — ED Provider Notes (Incomplete)
  [place] EMERGENCY DEPARTMENT AT [facility] Provider Note   CSN: [id number] Arrival date & time: 04/28/23  2020     History {Add pertinent medical, surgical, social history, OB history to HPI:1} Chief Complaint  Patient presents with  . Shoulder Injury    Katelyn Mcfarland is a 32 y.o. female.  HPI     Home Medications Prior to Admission medications   Medication Sig Start Date End Date Taking? Authorizing Provider  acetaminophen (TYLENOL) 500 MG tablet Take 500 mg by mouth every 6 (six) hours as needed for mild pain.   Yes [provider]  naproxen sodium (ALEVE) 220 MG tablet Take 220 mg by mouth daily as needed (for pain).   Yes [provider]  diclofenac Sodium (VOLTAREN) 1 % GEL Apply 2 g topically 4 (four) times daily. Patient not taking: Reported on 04/28/2023 08/11/22   Lilland, Alana, DO  fluticasone (FLONASE) 50 MCG/ACT nasal spray Place 2 sprays into both nostrils daily. Patient not taking: Reported on 04/28/2023 12/01/22   Reece Leader, DO  Probiotic Product (CULTURELLE PROBIOTICS PO) Take by mouth.    [provider]      Allergies    Patient has no known allergies.    Review of Systems   Review of Systems  Physical Exam Updated Vital Signs BP 115/75   Pulse 89   Temp 98.6 F (37 C) (Oral)   Resp (!) 23   SpO2 100%  Physical Exam  ED Results / Procedures / Treatments   Labs (all labs ordered are listed, but only abnormal results are displayed) Labs Reviewed - No data to display  EKG None  Radiology DG Shoulder Right  Result Date: 04/28/2023 CLINICAL DATA:  Right shoulder pain EXAM: RIGHT SHOULDER - 2+ VIEW COMPARISON:  None Available. FINDINGS: There is no evidence of fracture or dislocation. There is no evidence of arthropathy or other focal bone abnormality. Soft tissues are unremarkable. IMPRESSION: Negative. Electronically Signed   By: Helyn Numbers M.D.   On: 04/28/2023 22:03    Procedures Procedures   {Document cardiac monitor, telemetry assessment procedure when appropriate:1}  Medications Ordered in ED Medications - No data to display  ED Course/ Medical Decision Making/ A&P   {   Click here for ABCD2, HEART and other calculatorsREFRESH Note before signing :1}                          Medical Decision Making Amount and/or Complexity of Data Reviewed Radiology: ordered.   ***  {Document critical care time when appropriate:1} {Document review of labs and clinical decision tools ie heart score, Chads2Vasc2 etc:1}  {Document your independent review of radiology images, and any outside records:1} {Document your discussion with family members, caretakers, and with consultants:1} {Document social determinants of health affecting pt's care:1} {Document your decision making why or why not admission, treatments were needed:1} Final Clinical Impression(s) / ED Diagnoses Final diagnoses:  Sprain of right shoulder, unspecified shoulder sprain type, initial encounter    Rx / DC Orders ED Discharge Orders     None

## 2023-04-29 NOTE — ED Provider Notes (Signed)
[place] [facility]GENC[facility] DEPARTMENT AT [facility] Provider Note   CSN: [id number] Arrival date & time: 04/28/23  2020     History  Chief Complaint  Patient presents with   Shoulder Injury    Katelyn Mcfarland is a 32 y.o. female.  Patient is a 32 year old healthy female presenting today with persistent right shoulder pain that started yesterday as she was finishing work.  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  She also sometimes has to move patients who are struggling and fighting.  Since yesterday she has had a significant pain in the anterior portion of her shoulder.  It is worse if she lifts her arm or moves it out to the side.  Earlier it was hurting so bad she could not move it at all but now it is little bit better.  With certain movements it makes the pain radiate down her arm.  No numbness or tingling.  She did take some ibuprofen at home for the pain.  The history is provided by the patient.  Shoulder Injury       Home Medications Prior to Admission medications   Medication Sig Start Date End Date Taking? Authorizing Provider  acetaminophen (TYLENOL) 500 MG tablet Take 500 mg by mouth every 6 (six) hours as needed for mild pain.   Yes [provider]  naproxen sodium (ALEVE) 220 MG tablet Take 220 mg by mouth daily as needed (for pain).   Yes [provider]  diclofenac Sodium (VOLTAREN) 1 % GEL Apply 2 g topically 4 (four) times daily. Patient not taking: Reported on 04/28/2023 08/11/22   Lilland, Alana, DO  fluticasone (FLONASE) 50 MCG/ACT nasal spray Place 2 sprays into both nostrils daily. Patient not taking: Reported on 04/28/2023 12/01/22   Reece Leader, DO  Probiotic Product (CULTURELLE PROBIOTICS PO) Take by mouth.    [provider]      Allergies    Patient has no known allergies.    Review of Systems   Review of Systems  Physical Exam Updated Vital Signs BP 115/75   Pulse 89   Temp 98.6 F (37 C)  (Oral)   Resp (!) 23   SpO2 100%  Physical Exam Vitals and nursing note reviewed.  Cardiovascular:     Rate and Rhythm: Normal rate.     Pulses: Normal pulses.  Musculoskeletal:     Comments: Tenderness in the [facility] joint of the right shoulder.  Pain with abduction and extension.  No significant pain with internal or external rotation.  No weakness noted of the arm  Neurological:     Mental Status: She is alert. Mental status is at baseline.     Sensory: No sensory deficit.     Motor: No weakness.  Psychiatric:        Mood and Affect: Mood normal.     ED Results / Procedures / Treatments   Labs (all labs ordered are listed, but only abnormal results are displayed) Labs Reviewed - No data to display  EKG None  Radiology DG Shoulder Right  Result Date: 04/28/2023 CLINICAL DATA:  Right shoulder pain EXAM: RIGHT SHOULDER - 2+ VIEW COMPARISON:  None Available. FINDINGS: There is no evidence of fracture or dislocation. There is no evidence of arthropathy or other focal bone abnormality. Soft tissues are unremarkable. IMPRESSION: Negative. Electronically Signed   By: Helyn Numbers M.D.   On: 04/28/2023 22:03    Procedures Procedures    Medications  Ordered in ED Medications - No data to display  ED Course/ Medical Decision Making/ A&P                             Medical Decision Making Amount and/or Complexity of Data Reviewed Radiology: ordered and independent interpretation performed. Decision-making details documented in ED Course.   Patient presenting today with shoulder pain concerning for possible injury from work.  I have independently visualized and interpreted pt's images today.  Shoulder film today without evidence of dislocation or fracture.  Concern for possible sprain or rotator cuff injury.  Discussed x-ray findings with the patient.  Discussed with her avoiding lifting and following up with orthopedics if pain does not improve.  Also discussed Tylenol and  ibuprofen every 6 hours together for pain control and avoiding immobilization to prevent frozen shoulder.         Final Clinical Impression(s) / ED Diagnoses Final diagnoses:  Sprain of right shoulder, unspecified shoulder sprain type, initial encounter    Rx / DC Orders ED Discharge Orders     None         Gwyneth Sprout, MD 04/29/23 0005

## 2023-04-29 NOTE — Assessment & Plan Note (Signed)
Ordered mycoplasma/ureaplasma, candida 6 species, wet prep with GC/Chlamydia, trichomonas. If all negative, consider premarin.

## 2023-04-29 NOTE — Assessment & Plan Note (Signed)
UA with reflex to Ucx if indicated.

## 2023-04-30 LAB — URINALYSIS
Protein,UA: NEGATIVE
RBC, UA: NEGATIVE
Specific Gravity, UA: 1.018 (ref 1.005–1.030)
pH, UA: 5.5 (ref 5.0–7.5)

## 2023-05-01 ENCOUNTER — Other Ambulatory Visit: Payer: Self-pay

## 2023-05-01 ENCOUNTER — Encounter: Payer: Self-pay | Admitting: Family Medicine

## 2023-05-01 ENCOUNTER — Ambulatory Visit (INDEPENDENT_AMBULATORY_CARE_PROVIDER_SITE_OTHER): Payer: MEDICAID | Admitting: Family Medicine

## 2023-05-01 VITALS — BP 102/63 | HR 86 | Ht 59.0 in | Wt 218.8 lb

## 2023-05-01 DIAGNOSIS — M25511 Pain in right shoulder: Secondary | ICD-10-CM

## 2023-05-01 NOTE — Progress Notes (Signed)
    SUBJECTIVE:   CHIEF COMPLAINT / HPI:   Katelyn Mcfarland is a 32yo F w/ hx of prediabetes, tobacco use, and obesity that p/w R shoulder pain. 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 Pt was recommended ibuprofen and tylenol which helped. Pain is now improving. She has appt with Ortho on Monday. - She is hoping to be able to go back to work soon.   OBJECTIVE:   BP 102/63   Pulse 86   Ht 4\' 11"  (1.499 m)   Wt 218 lb 12.8 oz (99.2 kg)   SpO2 100%   BMI 44.19 kg/m   General: Alert, pleasant woman. NAD. HEENT: NCAT. MMM. CV: RRR, no murmurs.  Resp: CTAB, no wheezing or crackles. Normal WOB on RA.  Abm: Soft, nontender, nondistended.  Skin: Warm, well perfused  Msk: No pain to palpation of R shoulder. Pain is elicited with abduction and ext rotation  ASSESSMENT/PLAN:   Shoulder pain, right Recently seen in ED for R shoulder pain, XR neg, was diagnosed with likely rotator cuff injury and recommended tylenol and ibuprofen which have been helpful. Has appt with ortho this Monday. - While pain is less intense, recommended using Voltaren gel topically 4 times a day for next 7 days - Can still use prn tylenol or ibuprofen - Recommended icing/heat pad and avoiding strenuous activity.    Lincoln Brigham, MD [facility]

## 2023-05-01 NOTE — Assessment & Plan Note (Signed)
Recently seen in ED for R shoulder pain, XR neg, was diagnosed with likely rotator cuff injury and recommended tylenol and ibuprofen which have been helpful. Has appt with ortho this Monday. - While pain is less intense, recommended using Voltaren gel topically 4 times a day for next 7 days - Can still use prn tylenol or ibuprofen - Recommended icing/heat pad and avoiding strenuous activity.

## 2023-05-04 LAB — URINALYSIS
Bilirubin, UA: NEGATIVE
Glucose, UA: NEGATIVE
Ketones, UA: NEGATIVE

## 2023-05-04 LAB — MYCOPLASMA / UREAPLASMA CULTURE

## 2023-05-04 NOTE — Progress Notes (Deleted)
[facility]  PATIENT: Katelyn Mcfarland DOB: 1991/10/01  REFERRING CLINICIAN: Shelby Mattocks, DO HISTORY FROM: patient  REASON FOR VISIT: follow up   HISTORICAL  CHIEF COMPLAINT:  No chief complaint on file.   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  Patient was started on Diamox ***   UPDATE (11/17/22, VRP): Since last visit, doing better (now 2 migraine per week). Symptoms are improving but not resolved. No alleviating or aggravating factors. Tolerating meds, but ran out of refills. Did not get MRI brain.   PRIOR HPI: 32 year old female here for evaluation of headaches.  Patient started having headaches in middle school with global throbbing sensation, neck pain, eye pain, nausea and photophobia.  Sometimes she will see black spots.  Headaches can last hours at a time.  She was diagnosed with migraine headaches and treated with ibuprofen.  She has had headaches throughout the years almost on daily basis.  Headaches seem to improve slightly for past 2 to 3 years when she was pregnant and breast-feeding.  In last 3 weeks patient has had increasing headaches.  She is also had some episodes of bilateral visual loss with severe headaches.  She is having almost 20 to 25 days of headache per month.  Headaches can be unilateral or global.  She went to the emergency room a couple weeks ago for evaluation.  Possibility of optic nerve edema was raised.  CT head was normal.  She followed up with ophthalmology who saw some mild scar tissue on the left eye but no acute or active findings.  Patient is having some chronic fatigue, sleep issues and stress issues.  REVIEW OF SYSTEMS: Full 14 system review of systems performed and negative with exception of: As per HPI.  ALLERGIES: No Known Allergies  HOME MEDICATIONS: Outpatient Medications Prior to Visit  Medication  Sig Dispense Refill   acetaminophen (TYLENOL) 500 MG tablet Take 500 mg by mouth every 6 (six) hours as needed for mild pain.     diclofenac Sodium (VOLTAREN) 1 % GEL Apply 2 g topically 4 (four) times daily. (Patient not taking: Reported on 04/28/2023) 350 g 0   fluticasone (FLONASE) 50 MCG/ACT nasal spray Place 2 sprays into both nostrils daily. (Patient not taking: Reported on 04/28/2023) 16 g 6   naproxen sodium (ALEVE) 220 MG tablet Take 220 mg by mouth daily as needed (for pain).     Probiotic Product (CULTURELLE PROBIOTICS PO) Take by mouth.     No facility-administered medications prior to visit.    PAST MEDICAL HISTORY: Past Medical History:  Diagnosis Date   Allergy    Chlamydia    age 16yo; hx/o trichomonas age 73yo   Chronic headache    Depression    doing ok, declined meds   High cholesterol    Infection    UTI   Pregnancy induced hypertension    Trichomonas vaginalis infection    Vaginal Pap smear, abnormal    bx, cryo    PAST SURGICAL HISTORY: Past Surgical History:  Procedure Laterality Date   INDUCED ABORTION     TUBAL LIGATION Bilateral 11/15/2018   FAILED Procedure: POST PARTUM TUBAL LIGATION;  Surgeon: Hermina Staggers, MD;  Location: [facility];  Service: Gynecology;  Laterality: Bilateral;   WISDOM TOOTH EXTRACTION      FAMILY HISTORY: Family History  Problem Relation Age of Onset  Hypertension Mother    Diabetes Mother    Asthma Sister    Cancer Maternal Grandmother    Kidney disease Maternal Grandmother    Diabetes Maternal Grandmother    Breast cancer Paternal Grandmother    Ovarian cancer Paternal Grandmother    Cancer Paternal Grandfather    Heart disease Neg Hx    Stroke Neg Hx     SOCIAL HISTORY: Social History   Socioeconomic History   Marital status: Single    Spouse name: Not on file   Number of children: 4   Years of education: Not on file   Highest education level: Some college, no degree  Occupational History    Occupation: Land   Occupation: Contract CNA  Tobacco Use   Smoking status: Some Days    Current packs/day: 1.00    Average packs/day: 1 pack/day for 22.6 years (22.6 ttl pk-yrs)    Types: Cigarettes    Start date: 10/13/2000   Smokeless tobacco: Never  Vaping Use   Vaping status: Never Used  Substance and Sexual Activity   Alcohol use: Yes    Alcohol/week: 3.0 standard drinks of alcohol    Types: 3 Standard drinks or equivalent per week   Drug use: Not Currently    Types: Marijuana    Comment: last was october 2020   Sexual activity: Yes    Partners: Male    Birth control/protection: Surgical, Implant    Comment: tubal ligation  Other Topics Concern   Not on file  Social History Narrative   Single,  children, exercise - none.  Work - dietary aid    1 cup of coffee daily, 1 red bull daily   Limited soda - day 6 of no soda   Social Determinants of Health   Financial Resource Strain: Low Risk  (02/14/2023)   Overall Financial Resource Strain (CARDIA)    Difficulty of Paying Living Expenses: Not hard at all  Food Insecurity: No Food Insecurity (02/14/2023)   Hunger Vital Sign    Worried About Running Out of Food in the Last Year: Never true    Ran Out of Food in the Last Year: Never true  Transportation Needs: No Transportation Needs (02/14/2023)   PRAPARE - Administrator, Civil Service (Medical): No    Lack of Transportation (Non-Medical): No  Physical Activity: Unknown (02/14/2023)   Exercise Vital Sign    Days of Exercise per Week: 0 days    Minutes of Exercise per Session: Not on file  Stress: Stress Concern Present (02/14/2023)   [employer] of Occupational Health - Occupational Stress Questionnaire    Feeling of Stress : Very much  Social Connections: Socially Isolated (02/14/2023)   Social Connection and Isolation Panel [NHANES]    Frequency of Communication with Friends and Family: More than three times a week    Frequency of Social  Gatherings with Friends and Family: Twice a week    Attends Religious Services: Never    Database administrator or Organizations: No    Attends Engineer, structural: Not on file    Marital Status: Never married  Intimate Partner Violence: Unknown (01/17/2022)   Received from [employer], [facility]   HITS    Physically Hurt: Not on file    Insult or Talk Down To: Not on file    Threaten Physical Harm: Not on file    Scream or Curse: Not on file     PHYSICAL EXAM  GENERAL EXAM/CONSTITUTIONAL: Vitals:  There were no vitals filed for this visit.  There is no height or weight on file to calculate BMI. Wt Readings from Last 3 Encounters:  05/01/23 218 lb 12.8 oz (99.2 kg)  04/28/23 218 lb 4 oz (99 kg)  03/31/23 219 lb (99.3 kg)   GENERAL MALAISE APPEARANCE; well developed, nourished and groomed; neck is supple  CARDIOVASCULAR: Examination of carotid arteries is normal; no carotid bruits Regular rate and rhythm, no murmurs Examination of peripheral vascular system by observation and palpation is normal  EYES: Ophthalmoscopic exam of optic discs and posterior segments is normal; no DEFINITE papilledema or hemorrhages No results found.  MUSCULOSKELETAL: Gait, strength, tone, movements noted in Neurologic exam below  NEUROLOGIC: MENTAL STATUS:      No data to display         awake, alert, oriented to person, place and time recent and remote memory intact normal attention and concentration language fluent, comprehension intact, naming intact fund of knowledge appropriate  CRANIAL NERVE:  2nd - no DEFINITE papilledema on fundoscopic exam; MILD PHOTOPHOBIA 2nd, 3rd, 4th, 6th - pupils equal and reactive to light, visual fields full to confrontation, extraocular muscles intact, no nystagmus 5th - facial sensation symmetric 7th - facial strength symmetric 8th - hearing intact 9th - palate elevates symmetrically, uvula midline 11th - shoulder shrug  symmetric 12th - tongue protrusion midline  MOTOR:  normal bulk and tone, full strength in the BUE, BLE  SENSORY:  normal and symmetric to light touch  COORDINATION:  finger-nose-finger, fine finger movements normal  REFLEXES:  deep tendon reflexes present and symmetric  GAIT/STATION:  narrow based gait     DIAGNOSTIC DATA (LABS, IMAGING, TESTING) - I reviewed patient records, labs, notes, testing and imaging myself where available.  Lab Results  Component Value Date   WBC 9.3 07/24/2021   HGB 12.7 07/24/2021   HCT 39.5 07/24/2021   MCV 85.7 07/24/2021   PLT 249 07/24/2021      Component Value Date/Time   NA 140 07/14/2022 1218   K 3.7 07/14/2022 1218   CL 100 07/14/2022 1218   CO2 20 07/14/2022 1218   GLUCOSE 77 07/14/2022 1218   GLUCOSE 103 (H) 07/24/2021 1234   BUN 10 07/14/2022 1218   CREATININE 0.99 07/14/2022 1218   CREATININE 0.77 11/01/2013 1509   CALCIUM 9.3 07/14/2022 1218   PROT 7.1 07/14/2022 1218   ALBUMIN 4.3 07/14/2022 1218   AST 22 07/14/2022 1218   ALT 15 07/14/2022 1218   ALKPHOS 69 07/14/2022 1218   BILITOT <0.2 07/14/2022 1218   GFRNONAA >60 07/24/2021 1234   GFRAA >60 04/16/2020 1931   Lab Results  Component Value Date   CHOL 204 (H) 10/17/2021   HDL 44 10/17/2021   LDLCALC 141 (H) 10/17/2021   TRIG 107 10/17/2021   CHOLHDL 4.6 (H) 10/17/2021   Lab Results  Component Value Date   HGBA1C 6.0 (H) 07/14/2022   Lab Results  Component Value Date   VITAMINB12 617 05/23/2021   Lab Results  Component Value Date   TSH 3.190 07/14/2022    05/22/21 CT head [I reviewed images myself and agree with interpretation. -VRP]  - No acute intracranial findings.   ASSESSMENT AND PLAN  32 y.o. year old female here with headaches since middle school with migraine features.  Also with increasing headaches recently associated with transient visual loss and some optic nerve abnormalities.     Meds tried: topiramate (side effects),  propranolol, ibuprofen,  tylenol  Dx:  No diagnosis found.   PLAN:  NEW ONSET HEADACHE, VISION LOSS -Continue Diamox 500 mg twice daily -MRI brain 12/2022 partially empty sella, enlarged optic nerve sheaths -Lumbar puncture 02/2023 elevated opening pressure of 29   possible mild papilledema; eval and rule out IIH; follow up with ophthalmology - check MRI brain; then consider LP  MIGRAINE PREVENTION  LIFESTYLE CHANGES -Stop or avoid smoking -Decrease or avoid caffeine / alcohol -Eat and sleep on a regular schedule -Exercise several times per week - erenumab (Aimovig) 70mg  monthly (may increase to 140mg  monthly)  MIGRAINE RESCUE  - ibuprofen, tylenol as needed - rizatriptan (Maxalt) 10mg  as needed for breakthrough headache; may repeat x 1 after 2 hours; max 2 tabs per day or 8 per month - consider rimegepant (Nurtec) 75mg  as needed for breakthrough headache; max 8 per month  No orders of the defined types were placed in this encounter.  No orders of the defined types were placed in this encounter.  No follow-ups on file.    I spent *** minutes of face-to-face and non-face-to-face time with patient.  This included previsit chart review, lab review, study review, order entry, electronic health record documentation, patient education and discussion regarding above diagnoses and treatment plan and answered all other questions to patient's satisfaction  Ihor Austin, [facility]  [facility] [address] [place], [place] [address]  Phone [phone] Fax [phone] Note: This document was prepared with digital dictation and possible smart phrase technology. Any transcriptional errors that result from this process are unintentional.

## 2023-05-05 ENCOUNTER — Ambulatory Visit: Payer: MEDICAID | Admitting: Adult Health

## 2023-05-06 ENCOUNTER — Telehealth: Payer: Self-pay

## 2023-05-06 ENCOUNTER — Other Ambulatory Visit: Payer: Self-pay | Admitting: Student

## 2023-05-06 DIAGNOSIS — Z2239 Carrier of other specified bacterial diseases: Secondary | ICD-10-CM

## 2023-05-06 LAB — MYCOPLASMA / UREAPLASMA CULTURE: Ureaplasma urealyticum: POSITIVE — AB

## 2023-05-06 LAB — URINALYSIS
Leukocytes,UA: NEGATIVE
Nitrite, UA: NEGATIVE
Urobilinogen, Ur: 0.2 mg/dL (ref 0.2–1.0)

## 2023-05-06 MED ORDER — DOXYCYCLINE HYCLATE 100 MG PO TABS
100.0000 mg | ORAL_TABLET | Freq: Two times a day (BID) | ORAL | 0 refills | Status: AC
Start: 2023-05-06 — End: 2023-05-20

## 2023-05-06 NOTE — Telephone Encounter (Signed)
Patient calls nurse line requesting results from 7/16 visit.   She reports she is still having symptoms and would like to know the next steps.   Will forward to provider who saw patient.

## 2023-06-02 ENCOUNTER — Other Ambulatory Visit: Payer: Self-pay

## 2023-06-02 ENCOUNTER — Emergency Department (HOSPITAL_COMMUNITY)
Admission: EM | Admit: 2023-06-02 | Discharge: 2023-06-02 | Disposition: A | Discharge status: Home / Self Care | Payer: MEDICAID | Attending: Emergency Medicine | Admitting: Emergency Medicine

## 2023-06-02 ENCOUNTER — Encounter (HOSPITAL_COMMUNITY): Payer: Self-pay

## 2023-06-02 ENCOUNTER — Emergency Department (HOSPITAL_COMMUNITY): Payer: MEDICAID

## 2023-06-02 DIAGNOSIS — I10 Essential (primary) hypertension: Secondary | ICD-10-CM | POA: Diagnosis not present

## 2023-06-02 DIAGNOSIS — U071 COVID-19: Secondary | ICD-10-CM | POA: Insufficient documentation

## 2023-06-02 DIAGNOSIS — E876 Hypokalemia: Secondary | ICD-10-CM | POA: Insufficient documentation

## 2023-06-02 DIAGNOSIS — R509 Fever, unspecified: Secondary | ICD-10-CM | POA: Diagnosis present

## 2023-06-02 DIAGNOSIS — E871 Hypo-osmolality and hyponatremia: Secondary | ICD-10-CM | POA: Insufficient documentation

## 2023-06-02 LAB — CBC WITH DIFFERENTIAL/PLATELET
Abs Immature Granulocytes: 0.01 10*3/uL (ref 0.00–0.07)
Basophils Absolute: 0 10*3/uL (ref 0.0–0.1)
Basophils Relative: 0 %
Eosinophils Absolute: 0.1 10*3/uL (ref 0.0–0.5)
Eosinophils Relative: 1 %
HCT: 37.7 % (ref 36.0–46.0)
Hemoglobin: 12.8 g/dL (ref 12.0–15.0)
Immature Granulocytes: 0 %
Lymphocytes Relative: 8 %
Lymphs Abs: 0.4 10*3/uL — ABNORMAL LOW (ref 0.7–4.0)
MCH: 29.6 pg (ref 26.0–34.0)
MCHC: 34 g/dL (ref 30.0–36.0)
MCV: 87.3 fL (ref 80.0–100.0)
Monocytes Absolute: 0.4 10*3/uL (ref 0.1–1.0)
Monocytes Relative: 9 %
Neutro Abs: 3.7 10*3/uL (ref 1.7–7.7)
Neutrophils Relative %: 82 %
Platelets: 174 10*3/uL (ref 150–400)
RBC: 4.32 MIL/uL (ref 3.87–5.11)
RDW: 12.9 % (ref 11.5–15.5)
WBC: 4.6 10*3/uL (ref 4.0–10.5)
nRBC: 0 % (ref 0.0–0.2)

## 2023-06-02 LAB — BASIC METABOLIC PANEL
Anion gap: 8 (ref 5–15)
BUN: 9 mg/dL (ref 6–20)
CO2: 21 mmol/L — ABNORMAL LOW (ref 22–32)
Calcium: 8.8 mg/dL — ABNORMAL LOW (ref 8.9–10.3)
Chloride: 105 mmol/L (ref 98–111)
Creatinine, Ser: 0.9 mg/dL (ref 0.44–1.00)
GFR, Estimated: >60 mL/min (ref >60)
Glucose, Bld: 99 mg/dL (ref 70–99)
Potassium: 3.4 mmol/L — ABNORMAL LOW (ref 3.5–5.1)
Sodium: 134 mmol/L — ABNORMAL LOW (ref 135–145)

## 2023-06-02 LAB — RESP PANEL BY RT-PCR (RSV, FLU A&B, COVID)  RVPGX2
Influenza A by PCR: NEGATIVE
Influenza B by PCR: NEGATIVE
Resp Syncytial Virus by PCR: NEGATIVE
SARS Coronavirus 2 by RT PCR: POSITIVE — AB

## 2023-06-02 LAB — MONONUCLEOSIS SCREEN: Mono Screen: NEGATIVE

## 2023-06-02 LAB — GROUP A STREP BY PCR: Group A Strep by PCR: NOT DETECTED

## 2023-06-02 MED ORDER — ACETAMINOPHEN 500 MG PO TABS
1000.0000 mg | ORAL_TABLET | Freq: Once | ORAL | Status: AC | PRN
  Administered 2023-06-02: 1000 mg via ORAL
  Filled 2023-06-02: qty 2

## 2023-06-02 MED ORDER — POTASSIUM CHLORIDE CRYS ER 20 MEQ PO TBCR
20.0000 meq | EXTENDED_RELEASE_TABLET | Freq: Once | ORAL | Status: AC
  Administered 2023-06-02: 20 meq via ORAL
  Filled 2023-06-02: qty 1

## 2023-06-02 MED ORDER — SODIUM CHLORIDE 0.9 % IV BOLUS
1000.0000 mL | Freq: Once | INTRAVENOUS | Status: AC
  Administered 2023-06-02: 1000 mL via INTRAVENOUS

## 2023-06-02 NOTE — ED Triage Notes (Signed)
Pt arrived by EMS with headache, body ache, chills sore throat x3 days.  Denies n/v/d

## 2023-06-02 NOTE — ED Provider Notes (Addendum)
[place] EMERGENCY DEPARTMENT AT [facility] Provider Note   CSN: [id number] Arrival date & time: 06/02/23  1231     History  Chief Complaint  Patient presents with   Sore Throat   Fever   Headache         Katelyn Mcfarland is a 32 y.o. female with PMHx headache, HLD, HTN who presents to ED concerned for fever, headache, body aches, and sore throat, productive cough x3 days. Patient stating that symptoms worsened today. Productive cough with clear mucous. Patient stating that she is able to tolerate PO intake. No sick contacts.  Denies nausea, vomiting, diarrhea, abdominal pain, chest pain.   Sore Throat Associated symptoms include headaches.  Fever Associated symptoms: headaches   Headache Associated symptoms: fever        Home Medications Prior to Admission medications   Medication Sig Start Date End Date Taking? Authorizing Provider  acetaminophen (TYLENOL) 500 MG tablet Take 500 mg by mouth every 6 (six) hours as needed for mild pain.    [provider]  naproxen sodium (ALEVE) 220 MG tablet Take 220 mg by mouth daily as needed (for pain).    [provider]  Probiotic Product (CULTURELLE PROBIOTICS PO) Take by mouth.    [provider]      Allergies    Patient has no known allergies.    Review of Systems   Review of Systems  Constitutional:  Positive for fever.  Neurological:  Positive for headaches.    Physical Exam Updated Vital Signs BP 121/70 (BP Location: Left Arm)   Pulse 92   Temp 99.4 F (37.4 C) (Oral)   Resp 20   Ht 4\' 11"  (1.499 m)   LMP  (LMP Unknown)   SpO2 98%   BMI 44.19 kg/m  Physical Exam Vitals and nursing note reviewed.  Constitutional:      General: She is not in acute distress. HENT:     Head: Normocephalic and atraumatic.     Mouth/Throat:     Mouth: Mucous membranes are moist.     Pharynx: No oropharyngeal exudate.     Tonsils: No tonsillar exudate or tonsillar abscesses. 2+ on the  right. 2+ on the left.     Comments: Pharyngeal erythema and +2 tonsils BL without exudates.  Eyes:     General: No scleral icterus.       Right eye: No discharge.        Left eye: No discharge.     Conjunctiva/sclera: Conjunctivae normal.  Cardiovascular:     Rate and Rhythm: Normal rate.     Pulses: Normal pulses.     Heart sounds: No murmur heard. Pulmonary:     Effort: Pulmonary effort is normal. No respiratory distress.     Breath sounds: No wheezing, rhonchi or rales.  Abdominal:     Tenderness: There is no abdominal tenderness.  Musculoskeletal:     Right lower leg: No edema.     Left lower leg: No edema.  Skin:    General: Skin is warm and dry.     Findings: No rash.  Neurological:     General: No focal deficit present.     Mental Status: She is alert. Mental status is at baseline.     GCS: GCS eye subscore is 4. GCS verbal subscore is 5. GCS motor subscore is 6.     Comments: Negative kernigs and brudinski sign. No neck tenderness/stiffness  Psychiatric:  Mood and Affect: Mood normal.     ED Results / Procedures / Treatments   Labs (all labs ordered are listed, but only abnormal results are displayed) Labs Reviewed  RESP PANEL BY RT-PCR (RSV, FLU A&B, COVID)  RVPGX2 - Abnormal; Notable for the following components:      Result Value   SARS Coronavirus 2 by RT PCR POSITIVE (*)    All other components within normal limits  CBC WITH DIFFERENTIAL/PLATELET - Abnormal; Notable for the following components:   Lymphs Abs 0.4 (*)    All other components within normal limits  BASIC METABOLIC PANEL - Abnormal; Notable for the following components:   Sodium 134 (*)    Potassium 3.4 (*)    CO2 21 (*)    Calcium 8.8 (*)    All other components within normal limits  GROUP A STREP BY PCR  MONONUCLEOSIS SCREEN    EKG None  Radiology DG Chest 2 View  Result Date: 06/02/2023 CLINICAL DATA:  cough EXAM: CHEST - 2 VIEW COMPARISON:  March 31, 2023 FINDINGS: The  cardiomediastinal silhouette is normal in contour. No pleural effusion. No pneumothorax. No acute pleuroparenchymal abnormality. Visualized abdomen is unremarkable. No acute osseous abnormality noted. IMPRESSION: No acute cardiopulmonary abnormality. Electronically Signed   By: Meda Klinefelter M.D.   On: 06/02/2023 14:53    Procedures Procedures    Medications Ordered in ED Medications  acetaminophen (TYLENOL) tablet 1,000 mg (1,000 mg Oral Given 06/02/23 1336)  sodium chloride 0.9 % bolus 1,000 mL (1,000 mLs Intravenous New Bag/Given 06/02/23 1453)  potassium chloride SA (KLOR-CON M) CR tablet 20 mEq (20 mEq Oral Given 06/02/23 1453)    ED Course/ Medical Decision Making/ A&P                                 Medical Decision Making Amount and/or Complexity of Data Reviewed Labs: ordered. Radiology: ordered.  Risk OTC drugs. Prescription drug management.    This patient presents to the ED for concern of fever, body aches, sore throat, cough, this involves an extensive number of treatment options, and is a complaint that carries with it a high risk of complications and morbidity.  The differential diagnosis includes Flu/COVID/RSV, strep pharyngitis, tuberculosis, sinusitis, peritonsillar abscess, retropharyngeal abscess, pneumonia, meningitis.   Co morbidities that complicate the patient evaluation  headache, HLD, HTN   Lab Tests:  I Ordered, and personally interpreted labs.  The pertinent results include:   BMP: mild natremia and mild hypokalemia. BUN/Cr within normal limits CBC: No concern for anemia or leukocytosis Respiratory Panel: COVID Positive MonoPCR: Negative Strep POCT: Negative   Imaging Studies ordered:  I ordered imaging studies including  -chest xray: Assess for process contributing patient's symptoms I independently visualized and interpreted imaging  I agree with the radiologist interpretation    Problem List / ED Course / Critical interventions /  Medication management  Patient presents to ED concern for fever, body aches, sore throat, cough x 3 days.  Patient stating symptoms got worse today.  Patient initially tachycardic which resolved with IV fluids.  Patient also initially febrile with a temperature of 101F that resolved with 1 dose of Tylenol.  Physical exam negative for Kernig's and Brudzinski's sign.  Rest of physical exam unremarkable.  Patient stating that she feels better after her IV fluids and Tylenol. CBC without leukocytosis or anemia.  BMP with mild hyponatremia and mild hypokalemia.  I provided  patient with oral potassium supplement in the ED which she tolerated well.  Strep PCR negative.  Mono screen negative.  Respiratory panel positive for COVID. Patient was educated on alternating between 650 mg Tylenol and 400 mg ibuprofen every 3 hours as needed for pain/symptoms and the benefit that honey may provide for cough symptoms.  Recommended following up with primary care provider in the next couple of days.  Patient verbalized understanding of plan. Patient stating that she wants to leave before receiving entire bag of IV fluids because her child has a doctors appointment. I have reviewed the patients home medicines and have made adjustments as needed Patient was given return precautions and is stable for discharge at this time. Patient verbalized understanding of plan.   DDx: These are considered less likely due to history of present illness and physical exam findings -Peritonsillar/Retropharyngeal abscess: Patient denies dysphagia, dysphonia, and no throat/oral swelling appreciated -PNA: Lungs clear to auscultation bilaterally -Meningitis: patient's symptoms, vital signs, physical exam findings including lack of meningismus seem grossly less consistent at this time -Strep pharyngitis: Patient denies dysphagia and no tonsillar swelling/exudates appreciated -Tuberculosis: Patient denies night sweats, weight loss, fever, recent  travel, immunocompromise -Sinusitis: Patient denies purulent sputum or facial pain   Social Determinants of Health:  none          Final Clinical Impression(s) / ED Diagnoses Final diagnoses:  COVID    Rx / DC Orders ED Discharge Orders     None         Dorthy Cooler, [place] 06/02/23 1612    Valrie Hart F, PA-C 06/02/23 1625    Rozelle Logan, DO 06/02/23 1716

## 2023-06-02 NOTE — Discharge Instructions (Signed)
It was a pleasure caring for you today.  You are positive for COVID which is managed with symptomatic medicines such as ibuprofen and Tylenol.  Follow-up with your primary care provider in the next couple of days.  Seek emergency care if experiencing any new or worsening symptoms.  Alternating between 650 mg Tylenol and 400 mg Advil: The best way to alternate taking Acetaminophen (example Tylenol) and Ibuprofen (example Advil/Motrin) is to take them 3 hours apart. For example, if you take ibuprofen at 6 am you can then take Tylenol at 9 am. You can continue this regimen throughout the day, making sure you do not exceed the recommended maximum dose for each drug.

## 2023-06-03 ENCOUNTER — Ambulatory Visit: Payer: Medicaid Other | Admitting: Adult Health

## 2023-07-06 ENCOUNTER — Other Ambulatory Visit (HOSPITAL_COMMUNITY)
Admission: RE | Admit: 2023-07-06 | Discharge: 2023-07-06 | Disposition: A | Discharge status: Home / Self Care | Payer: MEDICAID | Source: Ambulatory Visit | Attending: Family Medicine | Admitting: Family Medicine

## 2023-07-06 ENCOUNTER — Ambulatory Visit (INDEPENDENT_AMBULATORY_CARE_PROVIDER_SITE_OTHER): Payer: MEDICAID | Admitting: Student

## 2023-07-06 ENCOUNTER — Encounter: Payer: Self-pay | Admitting: Student

## 2023-07-06 VITALS — BP 100/60 | HR 84 | Ht 59.0 in | Wt 219.0 lb

## 2023-07-06 DIAGNOSIS — R300 Dysuria: Secondary | ICD-10-CM | POA: Diagnosis not present

## 2023-07-06 DIAGNOSIS — N898 Other specified noninflammatory disorders of vagina: Secondary | ICD-10-CM | POA: Diagnosis present

## 2023-07-06 LAB — POCT WET PREP (WET MOUNT)
Clue Cells Wet Prep Whiff POC: NEGATIVE
Trichomonas Wet Prep HPF POC: ABSENT
WBC, Wet Prep HPF POC: >20

## 2023-07-06 MED ORDER — AZITHROMYCIN 500 MG PO TABS
1000.0000 mg | ORAL_TABLET | Freq: Every day | ORAL | 0 refills | Status: AC
Start: 2023-07-06 — End: 2023-07-07

## 2023-07-06 NOTE — Assessment & Plan Note (Addendum)
Ureaplasma positive however could not complete doxycycline course.  Symptoms did not resolve but were improving and have returned.  Opted not to retest given incomplete treatment.  Will attempt to treat with azithromycin 1 g once.  Should symptoms not completely resolved, consider urology referral.

## 2023-07-06 NOTE — Progress Notes (Signed)
  SUBJECTIVE:   CHIEF COMPLAINT / HPI:   Patient returns today for repeat STD testing.  Of note, she was seen on 7/16 and tested positive for Ureaplasma and provided doxycycline to treat dysuria.  She was unable to completely finish the doxycycline course.  She endorses vaginal odor, nausea and some abdominal discomfort, white liquidy vaginal discharge. No new sexual partners. It still burns when she urinates.   When she was on doxycycline, her urinary discomfort improved and her stomach discomfort and nausea also improved. Meals don't affect this at all. Her abdominal pain is lower and she describes it as achy. Denies vaginal bleeding.   She is having trouble with bowel movements feeling like she has to strain. She has pressure like she has to poop regularly.   PERTINENT  PMH / PSH: Prediabetes, GERD, migraines, tobacco use  OBJECTIVE:  BP 100/60   Pulse 84   Ht 4\' 11"  (1.499 m)   Wt 219 lb (99.3 kg)   SpO2 98%   BMI 44.23 kg/m  General: Well-appearing, NAD Abdomen: Soft, nontender, normoactive bowel sounds Pelvic exam: VULVA: normal appearing vulva with no masses, tenderness or lesions, VAGINA: normal appearing vagina with normal color and discharge, no lesions, CERVIX: normal appearing cervix without discharge or lesions, exam chaperoned by Jone Baseman, CMA.   ASSESSMENT/PLAN:   Assessment & Plan Dysuria Ureaplasma positive however could not complete doxycycline course.  Symptoms did not resolve but were improving and have returned.  Opted not to retest given incomplete treatment.  Will attempt to treat with azithromycin 1 g once.  Should symptoms not completely resolved, consider urology referral. Vaginal discharge Unremarkable pelvic exam.  Test for GC and trichomoniasis as requested by patient.   No follow-ups on file. Shelby Mattocks, DO 07/06/2023, 5:15 PM PGY-3, [facility]

## 2023-07-06 NOTE — Patient Instructions (Signed)
It was great to see you today! Thank you for choosing [facility] for your primary care.  Today we addressed: We checked for STDs today.  I am going to send in azithromycin 1 g to take once.  If by next week your symptoms are not resolved, please let me know and I would be happy to submit a urology referral for you.  If you haven't already, sign up for My Chart to have easy access to your labs results, and communication with your primary care physician. We are checking some labs today. If they are abnormal, I will call you. If they are normal, I will send you a MyChart message (if it is active) or a letter in the mail. If you do not hear about your labs in the next 2 weeks, please call the office.  Please arrive 15 minutes before your appointment to ensure smooth check in process.  We appreciate your efforts in making this happen.  Thank you for allowing me to participate in your care, Shelby Mattocks, DO 07/06/2023, 2:59 PM PGY-3, [facility]

## 2023-07-07 ENCOUNTER — Encounter: Payer: Self-pay | Admitting: Student

## 2023-07-07 ENCOUNTER — Ambulatory Visit (INDEPENDENT_AMBULATORY_CARE_PROVIDER_SITE_OTHER): Payer: MEDICAID | Admitting: Student

## 2023-07-07 VITALS — BP 114/76 | HR 91 | Ht 59.0 in | Wt 218.2 lb

## 2023-07-07 DIAGNOSIS — J029 Acute pharyngitis, unspecified: Secondary | ICD-10-CM | POA: Diagnosis not present

## 2023-07-07 LAB — POC SOFIA SARS ANTIGEN FIA: SARS Coronavirus 2 Ag: NEGATIVE

## 2023-07-07 NOTE — Patient Instructions (Addendum)
It was wonderful to see you today. Thank you for allowing me to be a part of your care. Below is a short summary of what we discussed at your visit today:  Your symptoms are consistent with viral upper respiratory infection.  Your COVID test was negative.  I recommend use of warm water, honey and lemon to help soothe your throat.  You can do Tylenol or ibuprofen for fever, body ache or sore throat.  Make sure you are staying well-hydrated during this time.  Continue to wear your mask.  If symptoms get worse and you start having difficulty breathing, shortness of breath or high fevers and chest pain.  Please make sure to return so that you can be reevaluated.  If you have any questions or concerns, please do not hesitate to contact us via phone or MyChart message.   Jerre Simon, MD [facility]

## 2023-07-07 NOTE — Progress Notes (Signed)
    SUBJECTIVE:   CHIEF COMPLAINT / HPI:   Patient is a 32 year old female presenting today for recent illness. Has been having cough and runny nose for the past 2 days. No fevers and other associated symptoms include sore throats and bodyache  known sick contact son who was recently diagnosed with walking pneumonia. She hasn't  tried anything for relief. No nausea, vomiting, ear pain, chest pain.  PERTINENT  PMH / PSH: Reviewed  OBJECTIVE:   BP 114/76   Pulse 91   Ht 4\' 11"  (1.499 m)   Wt 218 lb 3.2 oz (99 kg)   SpO2 100%   BMI 44.07 kg/m    Physical Exam General: Alert, well appearing, NAD HEENT: MMM, mild oropharyngeal erythema, No tonsillar exudate. Cardiovascular: RRR, No Murmurs, Normal S2/S2 Respiratory: CTAB, No wheezing or Rales Abdomen: No distension or tenderness Extremities: No edema on extremities    ASSESSMENT/PLAN:   Viral URI 32 y.o. year old presents with fever, cough, congestion, rhinorrhea and sore throat. She is afebrile today and on exam has oropharyngeal erythema,  good work of breathing on RA and clear breath sounds bilaterally. Overall presentation and exam is consistent with viral URI.  COVID test was negative. -Obtain COVID test. - Discussed conservative management with warm water, honey and lemon. - Recommended tylenol or ibuprofen for fever.  - Encouraged adequate hydration for patient.  - Outline signs and symptoms that will warrant ED visit or return for further assessment.      Jerre Simon, MD [facility]

## 2023-07-08 LAB — CERVICOVAGINAL ANCILLARY ONLY
Chlamydia: NEGATIVE
Comment: NEGATIVE
Comment: NORMAL
Neisseria Gonorrhea: NEGATIVE

## 2023-08-01 ENCOUNTER — Encounter (HOSPITAL_COMMUNITY): Payer: Self-pay

## 2023-08-01 ENCOUNTER — Emergency Department (HOSPITAL_COMMUNITY)
Admission: EM | Admit: 2023-08-01 | Discharge: 2023-08-02 | Discharge status: Against Medical Advice | Payer: MEDICAID | Attending: Emergency Medicine | Admitting: Emergency Medicine

## 2023-08-01 ENCOUNTER — Other Ambulatory Visit: Payer: Self-pay

## 2023-08-01 ENCOUNTER — Emergency Department (HOSPITAL_COMMUNITY): Payer: MEDICAID

## 2023-08-01 DIAGNOSIS — Z5321 Procedure and treatment not carried out due to patient leaving prior to being seen by health care provider: Secondary | ICD-10-CM | POA: Insufficient documentation

## 2023-08-01 DIAGNOSIS — R0602 Shortness of breath: Secondary | ICD-10-CM | POA: Diagnosis present

## 2023-08-01 DIAGNOSIS — G43909 Migraine, unspecified, not intractable, without status migrainosus: Secondary | ICD-10-CM | POA: Diagnosis not present

## 2023-08-01 DIAGNOSIS — R059 Cough, unspecified: Secondary | ICD-10-CM | POA: Diagnosis not present

## 2023-08-01 LAB — COMPREHENSIVE METABOLIC PANEL
ALT: 21 U/L (ref 0–44)
AST: 26 U/L (ref 15–41)
Albumin: 4.4 g/dL (ref 3.5–5.0)
Alkaline Phosphatase: 54 U/L (ref 38–126)
Anion gap: 6 (ref 5–15)
BUN: 10 mg/dL (ref 6–20)
CO2: 26 mmol/L (ref 22–32)
Calcium: 9.4 mg/dL (ref 8.9–10.3)
Chloride: 106 mmol/L (ref 98–111)
Creatinine, Ser: 0.84 mg/dL (ref 0.44–1.00)
GFR, Estimated: >60 mL/min (ref >60)
Glucose, Bld: 89 mg/dL (ref 70–99)
Potassium: 3.7 mmol/L (ref 3.5–5.1)
Sodium: 138 mmol/L (ref 135–145)
Total Bilirubin: 0.5 mg/dL (ref 0.3–1.2)
Total Protein: 8.3 g/dL — ABNORMAL HIGH (ref 6.5–8.1)

## 2023-08-01 LAB — URINALYSIS, W/ REFLEX TO CULTURE (INFECTION SUSPECTED)
Bacteria, UA: NONE SEEN
Bilirubin Urine: NEGATIVE
Glucose, UA: NEGATIVE mg/dL
Ketones, ur: NEGATIVE mg/dL
Leukocytes,Ua: NEGATIVE
Nitrite: NEGATIVE
Protein, ur: NEGATIVE mg/dL
Specific Gravity, Urine: 1.005 (ref 1.005–1.030)
pH: 6 (ref 5.0–8.0)

## 2023-08-01 LAB — CBC WITH DIFFERENTIAL/PLATELET
Abs Immature Granulocytes: 0.02 10*3/uL (ref 0.00–0.07)
Basophils Absolute: 0 10*3/uL (ref 0.0–0.1)
Basophils Relative: 1 %
Eosinophils Absolute: 0.1 10*3/uL (ref 0.0–0.5)
Eosinophils Relative: 2 %
HCT: 41.4 % (ref 36.0–46.0)
Hemoglobin: 13.8 g/dL (ref 12.0–15.0)
Immature Granulocytes: 0 %
Lymphocytes Relative: 33 %
Lymphs Abs: 2.6 10*3/uL (ref 0.7–4.0)
MCH: 30.1 pg (ref 26.0–34.0)
MCHC: 33.3 g/dL (ref 30.0–36.0)
MCV: 90.2 fL (ref 80.0–100.0)
Monocytes Absolute: 0.4 10*3/uL (ref 0.1–1.0)
Monocytes Relative: 5 %
Neutro Abs: 4.5 10*3/uL (ref 1.7–7.7)
Neutrophils Relative %: 59 %
Platelets: 231 10*3/uL (ref 150–400)
RBC: 4.59 MIL/uL (ref 3.87–5.11)
RDW: 13.3 % (ref 11.5–15.5)
WBC: 7.7 10*3/uL (ref 4.0–10.5)
nRBC: 0 % (ref 0.0–0.2)

## 2023-08-01 LAB — I-STAT CG4 LACTIC ACID, ED: Lactic Acid, Venous: 0.4 mmol/L — ABNORMAL LOW (ref 0.5–1.9)

## 2023-08-01 LAB — HCG, SERUM, QUALITATIVE: Preg, Serum: NEGATIVE

## 2023-08-01 MED ORDER — IBUPROFEN 200 MG PO TABS
400.0000 mg | ORAL_TABLET | Freq: Once | ORAL | Status: AC | PRN
  Administered 2023-08-01: 400 mg via ORAL
  Filled 2023-08-01: qty 2

## 2023-08-01 NOTE — ED Triage Notes (Signed)
Pt complaining of a productive cough for 2x weeks, migraines, SOB with exertion, and body aches. Denies any N/V/D.

## 2023-08-05 ENCOUNTER — Ambulatory Visit: Payer: MEDICAID | Admitting: Student

## 2023-08-06 ENCOUNTER — Ambulatory Visit: Payer: MEDICAID | Admitting: Family Medicine

## 2023-08-06 NOTE — Progress Notes (Signed)
    SUBJECTIVE:   CHIEF COMPLAINT / HPI: ED follow-up and dysuria  Cough ED on 08/01/23 for productive cough x 2 weeks CBC, CMP, UA, bhcg wnl 10/19 CXR showed increased perihilar interstitial thickening, no focal  Has been doing okay Moving around makes her a little short of breath and still has a cough but overall improving a lot Denies any fevers Discharged herself due to long wait  Dysuria Patient has had Ureaplasma in past on genital swab Was treated initially with doxycyline but could not tolerate it due to GI side effectes Azithromycin 1000 mg helped but came back at end of the week Dysuria- Yes  Frequency- Yes  Urgency- Yes  Hematuria- No Flank pain- No Suprapubic pain- No Fevers- No   PERTINENT  PMH / PSH: sickle cell trait  OBJECTIVE:   BP 118/78   Pulse 86   Ht 4\' 11"  (1.499 m)   Wt 221 lb 4 oz (100.4 kg)   SpO2 100%   BMI 44.69 kg/m   General: NAD, awake, alert, responsive to questions Head: Normocephalic atraumatic, no oropharyngeal exudates, no cervical adenopathy CV: Regular rate and rhythm no murmurs rubs or gallops Respiratory: Clear to ausculation bilaterally, no wheezes rales, mild crackles, chest rises symmetrically,  no increased work of breathing on room air Extremities: Moves upper and lower extremities freely, no edema in LE  ASSESSMENT/PLAN:    Assessment & Plan Dysuria Continues to have dysuria is been ongoing for a very long time.  Thought to be related to Ureaplasma. -Start azithromycin 500 mg daily for 5 days per Dr. Deirdre Priest -UA, urine culture -Consider urology referral if continuing Subacute cough Ongoing for 2 weeks now.  No focal findings on examination or chest x-ray.  No fevers. -ED/return precautions -Flonase sent to pharmacy for postnasal drip   Levin Erp, MD [facility]

## 2023-08-06 NOTE — Progress Notes (Deleted)
    SUBJECTIVE:   CHIEF COMPLAINT / HPI:   ***  Cough, migraines   CBC, CMP, UA wnl 10/19 CXR showed increased perihilar interstitial thickening  PERTINENT  PMH / PSH: ***  OBJECTIVE:   There were no vitals taken for this visit.  ***  ASSESSMENT/PLAN:   No problem-specific Assessment & Plan notes found for this encounter.     Lincoln Brigham, MD [facility]

## 2023-08-07 ENCOUNTER — Ambulatory Visit (INDEPENDENT_AMBULATORY_CARE_PROVIDER_SITE_OTHER): Payer: MEDICAID | Admitting: Student

## 2023-08-07 VITALS — BP 118/78 | HR 86 | Ht 59.0 in | Wt 221.2 lb

## 2023-08-07 DIAGNOSIS — R052 Subacute cough: Secondary | ICD-10-CM

## 2023-08-07 DIAGNOSIS — R300 Dysuria: Secondary | ICD-10-CM

## 2023-08-07 DIAGNOSIS — Z23 Encounter for immunization: Secondary | ICD-10-CM | POA: Diagnosis not present

## 2023-08-07 LAB — POCT URINALYSIS DIP (MANUAL ENTRY)
Bilirubin, UA: NEGATIVE
Glucose, UA: NEGATIVE mg/dL
Ketones, POC UA: NEGATIVE mg/dL
Leukocytes, UA: NEGATIVE
Nitrite, UA: NEGATIVE
Protein Ur, POC: NEGATIVE mg/dL
Spec Grav, UA: 1.015 (ref 1.010–1.025)
Urobilinogen, UA: 0.2 U/dL
pH, UA: 7 (ref 5.0–8.0)

## 2023-08-07 MED ORDER — AZITHROMYCIN 500 MG PO TABS
500.0000 mg | ORAL_TABLET | Freq: Every day | ORAL | 0 refills | Status: AC
Start: 2023-08-07 — End: 2023-08-12

## 2023-08-07 MED ORDER — FLUTICASONE PROPIONATE 50 MCG/ACT NA SUSP: 2.0000 | Freq: Every day | NASAL | 6 refills | Status: DC

## 2023-08-07 NOTE — Patient Instructions (Signed)
It was great to see you! Thank you for allowing me to participate in your care!   Our plans for today:  - azithromycin 500 mg daily for 5 days - we will culture your urine today  Take care and seek immediate care sooner if you develop any concerns.  Levin Erp, MD

## 2023-08-07 NOTE — Assessment & Plan Note (Signed)
Continues to have dysuria is been ongoing for a very long time.  Thought to be related to Ureaplasma. -Start azithromycin 500 mg daily for 5 days per Dr. Deirdre Priest -UA, urine culture -Consider urology referral if continuing

## 2023-08-09 LAB — URINE CULTURE

## 2023-08-11 ENCOUNTER — Encounter: Payer: Self-pay | Admitting: Student

## 2023-08-11 DIAGNOSIS — R300 Dysuria: Secondary | ICD-10-CM

## 2023-08-19 NOTE — Addendum Note (Signed)
Addended by: Levin Erp on: 08/19/2023 07:32 PM   Modules accepted: Orders

## 2023-10-27 ENCOUNTER — Ambulatory Visit: Payer: MEDICAID | Admitting: Family Medicine

## 2023-10-27 NOTE — Progress Notes (Deleted)
    SUBJECTIVE:   CHIEF COMPLAINT / HPI: STD testing    PERTINENT  PMH / PSH: ***  OBJECTIVE:   There were no vitals taken for this visit.  ***  ASSESSMENT/PLAN:   Assessment & Plan  No follow-ups on file.  Ozell Provencal, MD [facility]

## 2023-11-12 NOTE — Patient Instructions (Signed)
It was great to see you! Thank you for allowing me to participate in your care!  I recommend that you always bring your medications to each appointment as this makes it easy to ensure we are on the correct medications and helps [name] not miss when refills are needed.  Our plans for today:  - Sexually Transmitted Infection Screen Today we are testing you for STI/UTI and will notify you if anything is found.    I'm also placing a new referral to Urology for you to get connected to a different clinic.   If you haven't heard anything in 2 weeks, call the clinic back and ask about the referral.  We are checking some labs today, I will call you if they are abnormal will send you a MyChart message or a letter if they are normal.  If you do not hear about your labs in the next 2 weeks please let us know.  Take care and seek immediate care sooner if you develop any concerns.   Dr. Bess Kinds, MD [facility]

## 2023-11-12 NOTE — Progress Notes (Cosign Needed)
  SUBJECTIVE:   CHIEF COMPLAINT / HPI:   Sexually Transmitted Infection Screen Comes in for STI testing, patient appreciates she has new partner.  Patient denies any new symptoms, but reports she still has abdominal discomfort, worse when she has to pee, and still feels like she has some dysuria.  Patient sexually active with 1 partner, did not use protection.  Patient uses Nexplanon for contraception.  Patient appreciates that she has had referral made for urologist, but that office does not take her insurance.   PERTINENT  PMH / PSH:    OBJECTIVE:  There were no vitals taken for this visit. Physical Exam Exam conducted with a chaperone present.  Constitutional:      General: She is not in acute distress.    Appearance: Normal appearance. She is not ill-appearing.  Genitourinary:    Pubic Area: No rash.      Labia:        Right: No rash, tenderness, lesion or injury.        Left: No rash, tenderness, lesion or injury.      Vagina: No signs of injury. Vaginal discharge present. No erythema, tenderness, bleeding or lesions.     Cervix: Normal. No cervical motion tenderness, discharge, friability, lesion, erythema or cervical bleeding.  Neurological:     Mental Status: She is alert.      ASSESSMENT/PLAN:   Assessment & Plan Routine screening for STI (sexually transmitted infection) Patient comes in for routine screening for STI.  Patient has new partner, does not use protection, has Nexplanon for contraception.  Patient denies any symptoms that are new, but reports still having abdominal tenderness, worse when her bladder is full, and dysuria that's been present for months. Patient has had numerous UA's for these symptoms w/o signs of infection. Patient has been referred to Urologist, but needs a new referral as the clinc she was referred to does not accept her insurance. Will plan to evaluate for UTI/STI given new partner, and place referral.  -STI screen  (GC/CT/Trich/BV/Candida/HIV/RPR) -UA / Ucx -Referral to Urology No follow-ups on file. Bess Kinds, MD 11/13/2023, 11:14 AM PGY-3, [facility]

## 2023-11-13 ENCOUNTER — Other Ambulatory Visit (HOSPITAL_COMMUNITY)
Admission: RE | Admit: 2023-11-13 | Discharge: 2023-11-13 | Disposition: A | Discharge status: Home / Self Care | Payer: MEDICAID | Source: Ambulatory Visit | Attending: Family Medicine | Admitting: Family Medicine

## 2023-11-13 ENCOUNTER — Ambulatory Visit (INDEPENDENT_AMBULATORY_CARE_PROVIDER_SITE_OTHER): Payer: MEDICAID | Admitting: Student

## 2023-11-13 VITALS — BP 113/74 | HR 87 | Ht 59.0 in | Wt 219.8 lb

## 2023-11-13 DIAGNOSIS — Z113 Encounter for screening for infections with a predominantly sexual mode of transmission: Secondary | ICD-10-CM | POA: Insufficient documentation

## 2023-11-13 DIAGNOSIS — R300 Dysuria: Secondary | ICD-10-CM

## 2023-11-13 LAB — POCT URINALYSIS DIP (MANUAL ENTRY)
Bilirubin, UA: NEGATIVE
Glucose, UA: NEGATIVE mg/dL
Ketones, POC UA: NEGATIVE mg/dL
Nitrite, UA: NEGATIVE
Protein Ur, POC: NEGATIVE mg/dL
Spec Grav, UA: 1.015 (ref 1.010–1.025)
Urobilinogen, UA: 0.2 U/dL
pH, UA: 6 (ref 5.0–8.0)

## 2023-11-13 LAB — POCT URINE PREGNANCY: Preg Test, Ur: NEGATIVE

## 2023-11-13 LAB — POCT UA - MICROSCOPIC ONLY: Trichomonas, UA: POSITIVE

## 2023-11-13 MED ORDER — METRONIDAZOLE 500 MG PO TABS
500.0000 mg | ORAL_TABLET | Freq: Two times a day (BID) | ORAL | 0 refills | Status: AC
Start: 2023-11-13 — End: 2023-11-20

## 2023-11-13 NOTE — Addendum Note (Signed)
Addended by: Bess Kinds T on: 11/13/2023 05:48 PM   Modules accepted: Orders

## 2023-11-13 NOTE — Assessment & Plan Note (Addendum)
Patient comes in for routine screening for STI.  Patient has new partner, does not use protection, has Nexplanon for contraception.  Patient denies any symptoms that are new, but reports still having abdominal tenderness, worse when her bladder is full, and dysuria that's been present for months. Patient has had numerous UA's for these symptoms w/o signs of infection. Patient has been referred to Urologist, but needs a new referral as the clinc she was referred to does not accept her insurance. Will plan to evaluate for UTI/STI given new partner, and place referral.  -STI screen (GC/CT/Trich/BV/Candida/HIV/RPR) -UA / Ucx -Referral to Urology

## 2023-11-14 LAB — HIV ANTIBODY (ROUTINE TESTING W REFLEX): HIV Screen 4th Generation wRfx: NONREACTIVE

## 2023-11-14 LAB — RPR: RPR Ser Ql: NONREACTIVE

## 2023-11-15 LAB — URINE CULTURE

## 2023-11-16 LAB — CERVICOVAGINAL ANCILLARY ONLY
Bacterial Vaginitis (gardnerella): POSITIVE — AB
Candida Glabrata: NEGATIVE
Candida Vaginitis: NEGATIVE
Chlamydia: NEGATIVE
Comment: NEGATIVE
Comment: NEGATIVE
Comment: NEGATIVE
Comment: NEGATIVE
Comment: NEGATIVE
Comment: NORMAL
Neisseria Gonorrhea: NEGATIVE
Trichomonas: POSITIVE — AB

## 2024-01-04 ENCOUNTER — Ambulatory Visit (INDEPENDENT_AMBULATORY_CARE_PROVIDER_SITE_OTHER): Payer: MEDICAID

## 2024-01-04 DIAGNOSIS — Z111 Encounter for screening for respiratory tuberculosis: Secondary | ICD-10-CM | POA: Diagnosis not present

## 2024-01-05 NOTE — Progress Notes (Signed)
 Patient is here for a PPD placement.  PPD placed in left forearm @ 10:08 am.  Patient will return 01/06/2024 to have PPD read. Veronda Prude, RN

## 2024-01-06 ENCOUNTER — Ambulatory Visit: Payer: MEDICAID

## 2024-01-06 DIAGNOSIS — Z111 Encounter for screening for respiratory tuberculosis: Secondary | ICD-10-CM

## 2024-01-06 LAB — TB SKIN TEST
Induration: 0 mm
TB Skin Test: NEGATIVE

## 2024-01-06 NOTE — Progress Notes (Signed)
PPD Reading Note PPD read and results entered in EpicCare. Result: 0 mm induration. Interpretation: Negative Allergic reaction: No  

## 2024-01-18 ENCOUNTER — Encounter: Payer: MEDICAID | Admitting: Student

## 2024-01-19 NOTE — Progress Notes (Deleted)
    SUBJECTIVE:   CHIEF COMPLAINT / HPI:   ***  Cardiovascular: - Risk as of ***(date): *** (assessment every 3-5 years) - Dx Hypertension: no  - Dx Hyperlipidemia: no (once age 36-21; high risk >35yo, low risk >45) - Dx Obesity: yes (Class I BMI <34.9, Class II <39.9, Class III < 49.9)*** - Physical Activity: {YES/NO/WILD UJWJX:91478}  - Diabetes: {YES/NO/WILD GNFAO:13086} (age 31-70 w/ BMI >25, or HTN, or HLD) *** (A1c >6.5, or classic Sxs w/ random CBG >200, or FBG >126 (fasting >8hr))***  Cancer: Not due for pap   Social: Alcohol Use: {YES/NO/WILD VHQIO:96295}  Tobacco Use: {YES/NO/WILD MWUXL:24401}   - Interested in Quitting: {YES/NO/WILD UUVOZ:36644}  Other Drugs: {YES/NO/WILD IHKVQ:25956}  Risky Sexual Behavior: {YES/NO/WILD CARDS:18581}  - Chlamydia: <25yo, or >25yo and incr risk - Gonorrhea: sexually active <25yo, or at increased risk - Syphilis: If at increased risk - HIV: All individuals (15-18yo once, then annually if risks are high) >> should be checked anytime STDs are checked. - Hep C: once if born in [name] between 1945-1965; or at increased risk - Hep B: If at increased risk*** Depression: {YES/NO/WILD CARDS:18581}   - PHQ9 score:  Support and Life at Home: {YES/NO/WILD LOVFI:43329}   Other: Osteoporosis: {YES/NO/WILD CARDS:18581} (postmenopausal women <65yo with risk factors -- do BMD screen)*** Flu Vaccine: {YES/NO/WILD JJOAC:16606}  Pneumonia Vaccine: {YES/NO/WILD TKZSW:10932} (those w/ risk factors) - Both: Immunocompromised, cochlear implant, CSF leak, asplenic, sickle cell, CKD - PPSV-23 only: Heart dz, lung disease, DM, tobacco abuse, alcoholism, cirrhosis/liver disease.***  PERTINENT  PMH / PSH: migraine, GERD, sickle cell trait, bipolar 1 disorder, prediabetes  OBJECTIVE:   There were no vitals taken for this visit.  ***  ASSESSMENT/PLAN:   Assessment & Plan      Para March, DO [facility]

## 2024-01-20 ENCOUNTER — Encounter: Payer: MEDICAID | Admitting: Family Medicine

## 2024-01-21 ENCOUNTER — Ambulatory Visit: Payer: MEDICAID | Admitting: Family Medicine

## 2024-01-21 ENCOUNTER — Encounter: Payer: Self-pay | Admitting: Family Medicine

## 2024-01-21 ENCOUNTER — Other Ambulatory Visit (HOSPITAL_COMMUNITY)
Admission: RE | Admit: 2024-01-21 | Discharge: 2024-01-21 | Disposition: A | Discharge status: Home / Self Care | Payer: MEDICAID | Source: Ambulatory Visit | Attending: Family Medicine | Admitting: Family Medicine

## 2024-01-21 VITALS — BP 132/86 | HR 103 | Ht 59.0 in | Wt 221.0 lb

## 2024-01-21 DIAGNOSIS — N898 Other specified noninflammatory disorders of vagina: Secondary | ICD-10-CM

## 2024-01-21 DIAGNOSIS — R45851 Suicidal ideations: Secondary | ICD-10-CM

## 2024-01-21 DIAGNOSIS — B3731 Acute candidiasis of vulva and vagina: Secondary | ICD-10-CM | POA: Insufficient documentation

## 2024-01-21 DIAGNOSIS — N760 Acute vaginitis: Secondary | ICD-10-CM | POA: Insufficient documentation

## 2024-01-21 DIAGNOSIS — Z113 Encounter for screening for infections with a predominantly sexual mode of transmission: Secondary | ICD-10-CM | POA: Insufficient documentation

## 2024-01-21 DIAGNOSIS — Z720 Tobacco use: Secondary | ICD-10-CM

## 2024-01-21 DIAGNOSIS — Z0000 Encounter for general adult medical examination without abnormal findings: Secondary | ICD-10-CM | POA: Diagnosis not present

## 2024-01-21 DIAGNOSIS — R7303 Prediabetes: Secondary | ICD-10-CM

## 2024-01-21 DIAGNOSIS — B9689 Other specified bacterial agents as the cause of diseases classified elsewhere: Secondary | ICD-10-CM | POA: Insufficient documentation

## 2024-01-21 LAB — POCT GLYCOSYLATED HEMOGLOBIN (HGB A1C): Hemoglobin A1C: 5.4 % (ref 4.0–5.6)

## 2024-01-21 LAB — POCT URINE PREGNANCY: Preg Test, Ur: NEGATIVE

## 2024-01-21 NOTE — Assessment & Plan Note (Signed)
 Patient with possible history of bipolar 1 disorder, PTSD, depression and history of psychiatric hospitalization.  Denies history of suicide attempts.  Does not currently have a plan but does have suicidal ideation.  States that her friend took her gun away and she does not have any shells for her gun currently.  States that there are knives in her house that she can have put away as well.  States that she is probably going to go to the psychiatric ER or the walk-in clinic tomorrow morning or later today after she picks her kids up from school.  She is interested in seeing a psychiatrist and counseling.  She states that she does not need to go to the hospital at this time and feels like she has a safe plan for going home after her appointment today. - Did not start medication today as she has tried many in the past that did not work for her and history of possible bipolar - Provided with community resources, suicide hotline, and address for the psychiatric emergency room.  Do not see a urgent indication at this time for direct admission to [facility] because she is not currently suicidal with a plan in this appears to be a chronic issue. - Emergent referral to [facility] to assist with counseling set up and psychiatry referral placed - Scheduled follow-up appointment on Monday - Patient advised to immediately call 911 or go to the emergency room if she feels that she wants to harm herself

## 2024-01-21 NOTE — Progress Notes (Signed)
 SUBJECTIVE:   CHIEF COMPLAINT / HPI:   Presents for work physical, will return to drop form off from her job for me to fill out  Patient notes vaginal odor and white discharge for the last 3 to 4 days.  Denies fever, pelvic pain, abdominal pain, vaginal bleeding.  States symptoms are similar to when she had trichomonas a few months ago, she completed metronidazole course at that time with improvement of symptoms.  She thinks her last menstrual period started this morning, however her cycles are irregular because she uses Nexplanon.  Patient also noted to be very tearful.  I asked if she is wanted to hurt herself and she says yes, states she has suicidal ideations without a plan.  She states she had her best friend put her gun away.  She is under a lot of stress at home, taking care of 4 kids.  Has psychiatric history requiring hospitalization when she was younger, currently on no medications.  Has not followed up with a psychiatrist in several years.  She is interested in starting counseling and seeing a psychiatrist again. States that she has a good support system with her family and best friend.  States that if she is alone at the house she will just drive to her sisters and be with her.  Lipid panel 10/2021: LDL 141, total cholesterol 204, HDL 44 A1c 07/2022 - 6.0 Alcohol Use: 2-3 times a week liquor Tobacco Use: 1PPD.  She has quit before but started back 3 to 4 years ago.  Interested in quitting again.   PERTINENT  PMH / PSH: migraine, GERD, sickle cell trait, bipolar 1 disorder, prediabetes  OBJECTIVE:   BP 132/86   Pulse (!) 103   Ht 4\' 11"  (1.499 m)   Wt 221 lb (100.2 kg)   SpO2 95%   BMI 44.64 kg/m   GEN: Anxious appearing, tearful CV: Regular rate and rhythm, no murmurs Respiratory: CTAB, normal work of breathing on room air Pelvic: Deferred     01/21/2024    3:49 PM 11/13/2023   11:18 AM 08/07/2023   11:24 AM 07/07/2023    1:43 PM 07/06/2023    2:36 PM  Depression  screen PHQ 2/9  Decreased Interest 3 1 0 1 0  Down, Depressed, Hopeless 3 1 0 0 1  PHQ - 2 Score 6 2 0 1 1  Altered sleeping 1 3 0 1 1  Tired, decreased energy 3 3 0 2 3  Change in appetite 3 3 0 0 0  Feeling bad or failure about yourself  3 1 0 0 1  Trouble concentrating 3 1 0 1 2  Moving slowly or fidgety/restless 0 0 0 0 0  Suicidal thoughts 2 0 0 0 0  PHQ-9 Score 21 13 0 5 8   ASSESSMENT/PLAN:   Assessment & Plan Suicidal ideation Patient with possible history of bipolar 1 disorder, PTSD, depression and history of psychiatric hospitalization.  Denies history of suicide attempts.  Does not currently have a plan but does have suicidal ideation.  States that her friend took her gun away and she does not have any shells for her gun currently.  States that there are knives in her house that she can have put away as well.  States that she is probably going to go to the psychiatric ER or the walk-in clinic tomorrow morning or later today after she picks her kids up from school.  She is interested in seeing a psychiatrist and  counseling.  She states that she does not need to go to the hospital at this time and feels like she has a safe plan for going home after her appointment today. - Did not start medication today as she has tried many in the past that did not work for her and history of possible bipolar - Provided with community resources, suicide hotline, and address for the psychiatric emergency room.  Do not see a urgent indication at this time for direct admission to [facility] because she is not currently suicidal with a plan in this appears to be a chronic issue. - Emergent referral to [facility] to assist with counseling set up and psychiatry referral placed - Scheduled follow-up appointment on Monday - Patient advised to immediately call 911 or go to the emergency room if she feels that she wants to harm herself Vaginal discharge Discharge and odor for the last 3 to 4 days.  Patient opted to do  self swab today for STDs, declined HIV and RPR testing.  Urine pregnancy negative.  Will follow-up swab results. Healthcare maintenance A1c today 5.4.  Not due for any other lab work.  Pap is up-to-date.  She is going to bring in her form for her job for me to fill out. Tobacco use Patient interested in quitting but did not fully discuss today as there were more pressing issues above     Para March, DO [facility]

## 2024-01-21 NOTE — Patient Instructions (Addendum)
 You can go to the Psychiatric ED at anytime if you are in a mental health crisis. You can go to the Psychiatric Walk In Clinic tomorrow at 8am  Both are at [address]  Open Access/Walk In Clinic under & uninsured  [facility]  [address] [place], [place] [phone] Crisis [phone]  [facility] of the [place],  (Spanish)   [address], [place]: [phone]) 8:30 - 12; 1 - 2:30  Family Service of the [employer],  [address], [place]    [phone]):8:30 - 12; 2 - 3PM  [facility] [place],  [address]  [place]; [phone]):   Mon - Fri 8 AM - 5 PM  [facility] [address] [place]  MWF 12:30 to 3:00 or call to schedule an appointment  [phone]  Specific Provider options Psychology Today  [email or web address] click on find a therapist  enter your zip code left side and select or tailor a therapist for your specific need.   [facility] Provider Directory [email or web address]  (Medicaid)   Follow all drop down to find a provider  Social Support program Mental Health [place] [phone] or PhotoSolver.pl [address], [place], [place] Recovery support and educational   24- Hour Availability:   [facility]  [address] [place], [place] [phone] Crisis [phone]  Family Service of the [facility] [phone]  [place] [facility]  [phone]   [facility]  [phone] (after hours)  Therapeutic Alternative/Mobile Crisis   [phone]  [place] National Suicide Hotline  [phone] Len Childs)  Call 911 or go to emergency room  [facility]  [phone]);  [place] and [employer]  [phone]); [place], [place], [place], [place], [place], [place], [place]  If you are feeling suicidal  or depression symptoms worsen please immediately go to:   If you are thinking about harming yourself or having thoughts of suicide, or if you know someone who is, seek help right away. If you are in crisis, make sure you are not left alone.  If someone else is in crisis, make sure he/she/they is not left alone  Call 988 OR 1-800-273-TALK  24 Hour Availability for Walk-IN services  [facility]  [address] [place], [place] [phone] Crisis [phone]    Other crisis resources:  Family Service of the [employer] (Domestic Violence, Rape & Victim Assistance [phone]  [facility] [employer] [facility]    (ONLY from 8am-4pm)    [phone]  Therapeutic Alternative Mobile Crisis Unit (24/7)   [phone]  [place] National Suicide Hotline   [phone] Len Childs)      Therapy and Counseling Resources Most providers on this list will take Medicaid. Patients with commercial insurance or Medicare should contact their insurance company to get a list of in network providers.  [facility] (takes children) Location 1: [address] [place], [place] [address] Location 2: [address] [place], [place] [address] [phone]   [facility] (spanish speaking therapist available)(habla espanol)(take medicare and medicaid)  [address], [place], [place] [address], [place] [email or web address] [phone]  BestDay:Psychiatry and Counseling [address] [facility] [place], [place] [address] [phone]  [facility]   [address], Suite [place], [place] [address]      [phone]  [facility] (spanish available) [address]  [place], [place] [address] [phone]  [facility] (take [facility] and medicare) [address]  [place], [place] [address]       [phone]     [facility] (virtual only) [phone]  Jovita Kussmaul Total Access  Care [address], [place], [place] [phone]  [facility]:  [address] [place] [phone]  [facility]:  [address] [place] [phone]  [facility] (under & uninsured) [address]   [place] [phone]    [email or web address]    [facility] [address]  [place]    [phone]  [facility] -[address]     Phone:  [phone]     [facility]-  [address]  [phone]   [facility] [address]      [place], [place] [phone]  [facility]: [address] [place], [place], [place]  [phone]   [facility] (Spanish therapist) [email or web address]  [address] [place]  [address]   [place] [address]    [phone]    [facility]   [address],  [place], [place]  [phone]   [facility]  [address] [place]  [phone]  [facility]  [address] [place], [place]        [phone]

## 2024-01-22 ENCOUNTER — Telehealth: Payer: Self-pay | Admitting: Family Medicine

## 2024-01-22 ENCOUNTER — Other Ambulatory Visit: Payer: Self-pay | Admitting: Licensed Clinical Social Worker

## 2024-01-22 NOTE — Telephone Encounter (Signed)
 Called patient to follow-up from appointment yesterday.  She verified her date of birth.  Patient states that she is feeling better today.  She did not end up going to psychiatric walk-in clinic or emergency room because her kids slept and stayed out of school today.  I can hear them playing in the background.  She states that she is hanging out with her kids and playing with them and overall feeling better at the moment.  I reminded her of community resources in the event that she develops suicidal ideation again.  Advised her to go immediately to the emergency room if that occurs.  Advised her to call the office if she has any other concerns or questions.  Patient voiced understanding and is appreciative of the call.

## 2024-01-25 ENCOUNTER — Ambulatory Visit: Payer: MEDICAID | Admitting: Family Medicine

## 2024-01-25 ENCOUNTER — Telehealth: Payer: Self-pay

## 2024-01-25 ENCOUNTER — Encounter: Payer: Self-pay | Admitting: Family Medicine

## 2024-01-25 VITALS — BP 124/82 | HR 87 | Ht 59.0 in | Wt 221.4 lb

## 2024-01-25 DIAGNOSIS — R45851 Suicidal ideations: Secondary | ICD-10-CM

## 2024-01-25 DIAGNOSIS — Z111 Encounter for screening for respiratory tuberculosis: Secondary | ICD-10-CM | POA: Diagnosis not present

## 2024-01-25 NOTE — Assessment & Plan Note (Signed)
 PHQ-9 improved from last visit but still with suicidal thoughts.  They are fleeting, no active plan.  Patient states she still has resources that were given her at last appointment and plans to give to emergency room if needed if she becomes actively suicidal.  States that she has an appointment with social work on 4/17 and they are going to give her a plan.  She has not heard from psychiatry yet. - Follow-up in 2 to 4 weeks - Resources given again including address for psychiatric emergency room and suicide hotline

## 2024-01-25 NOTE — Patient Instructions (Signed)
 Visit Information  Thank you for taking time to visit with me today. Please don't hesitate to contact me if I can be of assistance to you before our next scheduled appointment.  Our next appointment is by telephone on 4/17 at 2 PM Please call the care guide team at [phone] if you need to cancel or reschedule your appointment.   Following is a copy of your care plan:   Goals Addressed             This Visit's Progress    [facility] Social Work Care Plan   On track    Problems:   Disease Management support and education needs related to Bipolar Disorder  CSW Clinical Goal(s):   Over the next 90 days the Patient will attend all scheduled medical appointments as evidenced by patient report and care team review of appointment completion in EMR:   demonstrate a reduction in symptoms related to Bipolar Disorder .  Interventions:  Mental Health:  Evaluation of current treatment plan related to Bipolar Disorder Active listening / Reflection utilized Behavioral Activation reviewed Depression screen reviewed Discussed referral for psychiatry: Patient is open to medication management Discussed referral options to connect for ongoing therapy: Patient is interested in individual counseling only Emotional Support Provided Mindfulness or Relaxation training provided Quality of sleep assessed & Sleep Hygiene techniques promoted Suicidal Ideation/Homicidal Ideation assessed: Patient endorses "fleeting" SI with no plan to hurt self or others  Patient has access to firearm but contracted for safety and states she has significant other or friend that she can provide it to, if needed. Secured from children  Patient Goals/Self-Care Activities:  Connect with provider for ongoing mental health treatment.   Increase coping skills, healthy habits, and stress reduction  Plan:   Next PCP appointment scheduled for: 01/25/24 Telephone follow up appointment with care management team member scheduled  for:  4/17        Please call the Suicide and Crisis Lifeline: 988 go to [facility] [address], [place] [phone]) call 911 if you are experiencing a Mental Health or Behavioral Health Crisis or need someone to talk to.  Patient verbalizes understanding of instructions and care plan provided today and agrees to view in MyChart. Active MyChart status and patient understanding of how to access instructions and care plan via MyChart confirmed with patient.     Arlis Bent [facility]  [facility], [facility] Clinical Social Worker Direct Dial: [phone]  Fax: [phone] Website: [email or web address] 7:55 AM

## 2024-01-25 NOTE — Patient Instructions (Signed)
 I am glad you are feeling better today! Continue your self care activities.  You can go to the Psychiatric ED at anytime if you are in a mental health crisis. You can go to the Psychiatric Walk In Clinic as well Both are at [address]   Open Access/Walk In Clinic under & uninsured   [facility]  [address] [place], [place] [phone] Crisis [phone]   Family Service of the [place],  (Spanish)   [address], [place]: [phone]) 8:30 - 12; 1 - 2:30   Family Service of the [employer],  [address] [place]    ([phone]):8:30 - 12; 2 - 3PM   [facility] [place],  [address]  [place]; [phone]):   Mon - Fri 8 AM - 5 PM   [facility] [address] [place]  MWF 12:30 to 3:00 or call to schedule an appointment  [phone]   Specific Provider options Psychology Today  [email or web address] click on find a therapist  enter your zip code left side and select or tailor a therapist for your specific need.    [facility] Provider Directory [email or web address]  (Medicaid)   Follow all drop down to find a provider   Social Support program Mental Health [place] [phone] or PhotoSolver.pl [address], [place], [place] Recovery support and educational    24- Hour Availability:    [facility]  [address] [place], [place] [phone] Crisis [phone]   [facility] Service of the [facility] [phone]   [place] [facility]  [phone]    [facility]  [phone] (after hours)   Therapeutic Alternative/Mobile Crisis   [phone]   [place] National Suicide Hotline  [phone] Len Childs)   Call 911 or go to emergency room   [facility]  [phone]);  [place] and [employer]  [phone]);  [place], [place], [place], [place], [place], [place], [place]   If you are feeling suicidal or depression symptoms worsen please immediately go to:    If you are thinking about harming yourself or having thoughts of suicide, or if you know someone who is, seek help right away. If you are in crisis, make sure you are not left alone.  If someone else is in crisis, make sure he/she/they is not left alone   Call 988 OR 1-800-273-TALK   24 Hour Availability for Walk-IN services  [facility]  [address] [place], [place] [phone] Crisis [phone]       Other crisis resources:   Family Service of the [employer] (Domestic Violence, Rape & Victim Assistance [phone]   [facility] [employer] [facility]    (ONLY from 8am-4pm)    [phone]   Therapeutic Alternative Mobile Crisis Unit (24/7)   [phone]   [place] National Suicide Hotline   [phone] Len Childs)           Therapy and Counseling Resources Most providers on this list will take Medicaid. Patients with commercial insurance or Medicare should contact their insurance company to get a list of in network providers.   [facility] [employer] (takes children) Location 1: [address] [place], [place] [address] Location 2: [address] [place], [place] [address] [phone]     [facility] (spanish speaking therapist available)(habla espanol)(take medicare and medicaid)  [address], [place], [place] [address], [place]  [email or web address] [phone]   BestDay:Psychiatry and Counseling [address] [facility] [place], [place] [address] [phone]   [facility]   [address], Suite [place], [place] [address]      [phone]   [facility] (spanish available) [address]  [place], [place] [address] [phone]   [facility] (take [facility] and medicare) [address]  [place], [place] [address]       [phone]      [facility] (virtual only) [phone]   Arnold Bicker Total Access Care [address], [place], [place] [phone]   [facility]:  [address] [place] [phone]   [facility]:  [address] [place] [phone]   [facility] (under & uninsured) [address]   [place] [phone]    [email or web address]     [facility] [address]  [place]    [phone]   [facility] -[address]     Phone:  [phone]     [facility]-  [address]  [phone]    [facility] [address]      [place], [place] [phone]   [facility]: [address] [place], [place], [place]  [phone]    [facility] (Spanish therapist) [email or web address]  [address] [place]  [address]   [place] [address]    [phone]     [facility]   [address],  [place], [place]  [phone]    [facility]  [address] [place]  [phone]   [facility]  [address] [place], [place]        [phone]

## 2024-01-25 NOTE — Progress Notes (Signed)
    SUBJECTIVE:   CHIEF COMPLAINT / HPI:   Seen by me 4/10 and endorsed SI without plan, here for follow up Doing a lot better today.  Still endorses intermittent SI but has no plan.  States that she woke up early this morning, did self care, plans to go to coffee shop with friend when she leaves here to work on homework.  Kids are out of school this week for spring break  Also needs PPD test for work  PERTINENT  PMH / PSH: migraine, GERD, sickle cell trait, bipolar 1 disorder, prediabetes  OBJECTIVE:   BP 124/82   Pulse 87   Ht 4\' 11"  (1.499 m)   Wt 221 lb 6.4 oz (100.4 kg)   SpO2 99%   BMI 44.72 kg/m   GEN: Well-appearing, no acute distress, pleasant     01/25/2024   10:48 AM 01/25/2024   10:45 AM 01/21/2024    3:49 PM  Depression screen PHQ 2/9  Decreased Interest 1 1 3   Down, Depressed, Hopeless 1 1 3   PHQ - 2 Score 2 2 6   Altered sleeping 0 0 1  Tired, decreased energy 3 3 3   Change in appetite 1 1 3   Feeling bad or failure about yourself  1 1 3   Trouble concentrating 2 2 3   Moving slowly or fidgety/restless 0 0 0  Suicidal thoughts 1 1 2   PHQ-9 Score 10 10 21     ASSESSMENT/PLAN:   Assessment & Plan Suicidal ideation PHQ-9 improved from last visit but still with suicidal thoughts.  They are fleeting, no active plan.  Patient states she still has resources that were given her at last appointment and plans to give to emergency room if needed if she becomes actively suicidal.  States that she has an appointment with social work on 4/17 and they are going to give her a plan.  She has not heard from psychiatry yet. - Follow-up in 2 to 4 weeks - Resources given again including address for psychiatric emergency room and suicide hotline PPD screening test Two step completed today     Sahej Hauswirth, DO [facility]

## 2024-01-25 NOTE — Patient Outreach (Signed)
 Complex Care Management   Visit Note  01/22/2024  Name:  Katelyn Mcfarland MRN: [id number] DOB: [birth year]  Situation: Referral received for Complex Care Management related to Menta/Behavioral Health diagnosis Bipolar I Disorder  I obtained verbal consent from Patient.  Visit completed with Patient  on the phone  Background:   Past Medical History:  Diagnosis Date   Allergy    Chlamydia    age 33yo; hx/o trichomonas age 8yo   Chronic headache    Depression    doing ok, declined meds   High cholesterol    Infection    UTI   Pregnancy induced hypertension    Trichomonas vaginalis infection    Vaginal Pap smear, abnormal    bx, cryo    Assessment: Patient Reported Symptoms:  Cognitive Cognitive Status: Alert and oriented to person, place, and time   Health Maintenance Behaviors: Stress management  Neurological      HEENT HEENT Symptoms Reported: No symptoms reported      Cardiovascular Cardiovascular Symptoms Reported: No symptoms reported    Respiratory Respiratory Symptoms Reported: No symptoms reported    Endocrine Patient reports the following symptoms related to hypoglycemia or hyperglycemia : No symptoms reported    Gastrointestinal Gastrointestinal Symptoms Reported: No symptoms reported      Genitourinary      Integumentary Integumentary Symptoms Reported: No symptoms reported    Musculoskeletal Musculoskelatal Symptoms Reviewed: No symptoms reported        Psychosocial Psychosocial Symptoms Reported: Depression - if selected complete PHQ 2-9, Sadness - if selected complete PHQ 2-9 Behavioral Health Conditions: Other (Bipolar I Disorder) Behavioral Management Strategies: Activity, Adequate rest, Coping strategies, Exercise, Support system Behavioral Health Self-Management Outcome: 2 (bad) Behavioral Health Comment: Pt reports SI symptoms "arent as bad as they used to be" Major Change/Loss/Stressor/Fears (CP): Death of a loved one, Medical condition,  self Behaviors When Feeling Stressed/Fearful: Increased irritablility, difficulty resting Techniques to Cope with Loss/Stress/Change: Diversional activities, Exercise, Play Quality of Family Relationships: involved Do you feel physically threatened by others?: No      01/21/2024    3:49 PM  Depression screen PHQ 2/9  Decreased Interest 3  Down, Depressed, Hopeless 3  PHQ - 2 Score 6  Altered sleeping 1  Tired, decreased energy 3  Change in appetite 3  Feeling bad or failure about yourself  3  Trouble concentrating 3  Moving slowly or fidgety/restless 0  Suicidal thoughts 2  PHQ-9 Score 21    There were no vitals filed for this visit.  Medications Reviewed Today     Reviewed by Adriana Albany, LCSW (Social Worker) on 01/25/24 at [phone]  Med List Status: <None>   Medication Order Taking? Sig Documenting Provider Last Dose Status Informant  acetaminophen (TYLENOL) 500 MG tablet [id number] No Take 500 mg by mouth every 6 (six) hours as needed for mild pain. [provider] 04/27/2023 Active Self  etonogestrel (NEXPLANON) 68 MG IMPL implant [id number]  1 each by Subdermal route once. [provider]  Active   fluticasone (FLONASE) 50 MCG/ACT nasal spray [id number]  Place 2 sprays into both nostrils daily. Genora Kidd, MD  Active   naproxen sodium (ALEVE) 220 MG tablet [id number] No Take 220 mg by mouth daily as needed (for pain). [provider] 04/27/2023 Active Self  Probiotic Product (CULTURELLE PROBIOTICS PO) [id number]  Take by mouth. [provider]  Active Self            Recommendation:   PCP Follow-up Continue  utilizing strategies discussed to assist with symptom management  Follow Up Plan:   Telephone follow-up 4/17  Alease Hunter, LCSW [place]  [facility], [facility] Clinical Social Worker Direct Dial: [phone]  Fax: [phone] Website: [email or web address] 7:53 AM

## 2024-01-25 NOTE — Telephone Encounter (Signed)
 Patient presents to clinic today with [facility] Exam form.   This requires 2 step TB skin testing.   Patient completed first TB test on 01/04/24.  Placed 2nd PPD today at 1100. Dr. Becki Bouton completed form with pending TB result.   Will place form in RN office in purple nurse visit folder for completion of PPD results and to provide to patient at nurse visit on 01/27/24.  Elsie Halo, RN

## 2024-01-25 NOTE — Progress Notes (Signed)
 Patient is here for a PPD placement.  PPD placed in left forearm @ 1100 am.  Patient will return 01/27/2024 to have PPD read. Elsie Halo, RN

## 2024-01-26 LAB — CERVICOVAGINAL ANCILLARY ONLY
Bacterial Vaginitis (gardnerella): POSITIVE — AB
Candida Glabrata: NEGATIVE
Candida Vaginitis: POSITIVE — AB
Chlamydia: NEGATIVE
Comment: NEGATIVE
Comment: NEGATIVE
Comment: NEGATIVE
Comment: NEGATIVE
Comment: NEGATIVE
Comment: NORMAL
Neisseria Gonorrhea: NEGATIVE
Trichomonas: NEGATIVE

## 2024-01-27 ENCOUNTER — Encounter: Payer: Self-pay | Admitting: Family Medicine

## 2024-01-27 ENCOUNTER — Other Ambulatory Visit: Payer: Self-pay | Admitting: Family Medicine

## 2024-01-27 ENCOUNTER — Ambulatory Visit: Payer: MEDICAID

## 2024-01-27 DIAGNOSIS — Z111 Encounter for screening for respiratory tuberculosis: Secondary | ICD-10-CM

## 2024-01-27 LAB — TB SKIN TEST
Induration: 0 mm
TB Skin Test: NEGATIVE

## 2024-01-27 MED ORDER — FLUCONAZOLE 150 MG PO TABS: ORAL_TABLET | ORAL | 0 refills | Status: DC

## 2024-01-27 MED ORDER — METRONIDAZOLE 500 MG PO TABS
500.0000 mg | ORAL_TABLET | Freq: Two times a day (BID) | ORAL | 0 refills | Status: AC
Start: 2024-01-27 — End: 2024-02-03

## 2024-01-27 NOTE — Progress Notes (Signed)
 PPD Reading Note PPD read and results entered in EpicCare. Result: 0 mm induration. Interpretation: Negative Allergic reaction: No   Form signed off on and returned to patient.

## 2024-01-28 ENCOUNTER — Other Ambulatory Visit: Payer: MEDICAID | Admitting: Licensed Clinical Social Worker

## 2024-02-01 NOTE — Patient Outreach (Signed)
 Complex Care Management   Visit Note  01/28/2024  Name:  Katelyn Mcfarland MRN: [id number] DOB: 05-Oct-1991  Situation: Referral received for Complex Care Management related to Menta/Behavioral Health diagnosis Bipolar I Disorder  I obtained verbal consent from Patient.  Visit completed with pt  on the phone  Background:   Past Medical History:  Diagnosis Date   Allergy    Chlamydia    age 33yo; hx/o trichomonas age 68yo   Chronic headache    Depression    doing ok, declined meds   High cholesterol    Infection    UTI   Pregnancy induced hypertension    Trichomonas vaginalis infection    Vaginal Pap smear, abnormal    bx, cryo    Assessment: Patient Reported Symptoms:  Cognitive        Neurological      HEENT        Cardiovascular      Respiratory      Endocrine      Gastrointestinal        Genitourinary      Integumentary      Musculoskeletal          Psychosocial              01/25/2024   10:48 AM  Depression screen PHQ 2/9  Decreased Interest 1  Down, Depressed, Hopeless 1  PHQ - 2 Score 2  Altered sleeping 0  Tired, decreased energy 3  Change in appetite 1  Feeling bad or failure about yourself  1  Trouble concentrating 2  Moving slowly or fidgety/restless 0  Suicidal thoughts 1  PHQ-9 Score 10    There were no vitals filed for this visit.  Medications Reviewed Today   Medications were not reviewed in this encounter     Recommendation:   Continue utilizing strategies discussed to assist with symptom management  Follow Up Plan:   Telephone follow-up 2-4 weeks  Alease Hunter, LCSW [place]  [facility], [facility] Clinical Social Worker Direct Dial: [phone]  Fax: [phone] Website: [email or web address] 8:09 AM

## 2024-02-01 NOTE — Patient Instructions (Signed)
 Visit Information  Thank you for taking time to visit with me today. Please don't hesitate to contact me if I can be of assistance to you before our next scheduled appointment.  Your next care management appointment is by telephone on 5/1 at 12:30 PM  Telephone follow-up 2 weeks  Please call the care guide team at [phone] if you need to cancel, schedule, or reschedule an appointment.   Please call the Suicide and Crisis Lifeline: 988 go to [facility] Urgent [facility] [address] [place] [phone]) call 911 if you are experiencing a Mental Health or Behavioral Health Crisis or need someone to talk to.  Alease Hunter, LCSW [place]  [facility] Clinical Social Worker Direct Dial: [phone]  Fax: [phone] Website: [email or web address] 8:10 AM'

## 2024-02-11 ENCOUNTER — Telehealth: Payer: MEDICAID | Admitting: Licensed Clinical Social Worker

## 2024-02-11 ENCOUNTER — Encounter: Payer: Self-pay | Admitting: Licensed Clinical Social Worker

## 2024-03-22 ENCOUNTER — Encounter: Payer: Self-pay | Admitting: *Deleted

## 2024-03-29 ENCOUNTER — Other Ambulatory Visit: Payer: MEDICAID | Admitting: Licensed Clinical Social Worker

## 2024-03-29 ENCOUNTER — Telehealth: Payer: Self-pay

## 2024-03-29 NOTE — Patient Outreach (Signed)
 Complex Care Management   Visit Note  03/29/2024  Name:  Katelyn Mcfarland MRN: [id number] DOB: 1991-04-13  Situation: Referral received for Complex Care Management related to Mental/Behavioral Health diagnosis Bipolar I Disorder I obtained verbal consent from Patient.  Visit completed with pt  on the phone  Background:   Past Medical History:  Diagnosis Date   Allergy    Chlamydia    age 33yo; hx/o trichomonas age 44yo   Chronic headache    Depression    doing ok, declined meds   High cholesterol    Infection    UTI   Pregnancy induced hypertension    Trichomonas vaginalis infection    Vaginal Pap smear, abnormal    bx, cryo    Assessment: Patient Reported Symptoms:  Cognitive Cognitive Status: Alert and oriented to person, place, and time, Normal speech and language skills Cognitive/Intellectual Conditions Management [RPT]: None reported or documented in medical history or problem list      Neurological Neurological Review of Symptoms: No symptoms reported    HEENT HEENT Symptoms Reported: Not assessed      Cardiovascular Cardiovascular Symptoms Reported: Not assessed    Respiratory Respiratory Symptoms Reported: Not assesed    Endocrine Patient reports the following symptoms related to hypoglycemia or hyperglycemia : Not assessed    Gastrointestinal Gastrointestinal Symptoms Reported: Not assessed      Genitourinary Genitourinary Symptoms Reported: Not assessed    Integumentary Integumentary Symptoms Reported: Not assessed    Musculoskeletal Musculoskelatal Symptoms Reviewed: Not assessed        Psychosocial Psychosocial Symptoms Reported: Depression - if selected complete PHQ 2-9, Anxiety - if selected complete GAD Additional Psychological Details: Pt reports psychosocial stressors resulting in SI with plan. LCSW and pt discussed safety plan and crisis intervention resources (provided via email and through MyChart) Pt currently denies SI and contracted for  safety. LCSW left detailed message with PCP office and will make add'l attempts to have pt assessed and med medication initiated to bridge her to [facility] appt. Pt reports that she has support for minor children, should she need to do walk-in at [facility]/PCP office Behavioral Health Conditions: Bipolar affective disorder (Bipolar I Disorder) Behavioral Management Strategies: Community resources, Coping strategies, Counseling, Medication therapy, Support system Major Change/Loss/Stressor/Fears (CP): Medical condition, self, Relationship concerns Techniques to Cope with Loss/Stress/Change: Diversional activities Quality of Family Relationships: involved Do you feel physically threatened by others?: No      01/25/2024   10:48 AM  Depression screen PHQ 2/9  Decreased Interest 1  Down, Depressed, Hopeless 1  PHQ - 2 Score 2  Altered sleeping 0  Tired, decreased energy 3  Change in appetite 1  Feeling bad or failure about yourself  1  Trouble concentrating 2  Moving slowly or fidgety/restless 0  Suicidal thoughts 1  PHQ-9 Score 10    There were no vitals filed for this visit.  Medications Reviewed Today     Reviewed by Candies Palm D, LCSW (Social Worker) on 03/29/24 at 1132  Med List Status: <None>   Medication Order Taking? Sig Documenting Provider Last Dose Status Informant  acetaminophen  (TYLENOL ) 500 MG tablet [id number] Yes Take 500 mg by mouth every 6 (six) hours as needed for mild pain. [provider]  Active Self  etonogestrel  (NEXPLANON ) 68 MG IMPL implant [id number] Yes 1 each by Subdermal route once. [provider]  Active   fluconazole  (DIFLUCAN ) 150 MG tablet [id number] Yes Take one tablet, then take the second tablet 72 hours  later Everhart, Kirstie, DO  Active   fluticasone  (FLONASE ) 50 MCG/ACT nasal spray [id number] Yes Place 2 sprays into both nostrils daily. Jagadish, Mayuri, MD  Active   naproxen  sodium (ALEVE ) 220 MG tablet [id number] Yes Take 220 mg by  mouth daily as needed (for pain). [provider]  Active Self  Probiotic Product (CULTURELLE PROBIOTICS PO) [id number] Yes Take by mouth. [provider]  Active Self            Recommendation:   Acute PCP follow-up Pt encouraged to schedule appt for assessment and med management. Specialty provider follow-up Pt encouraged to schedule for assessment and med management with [facility] or visit Walk-In  Follow Up Plan:   Telephone follow-up 04/01/24  Alease Hunter, LCSW [place]  [facility], [place] [facility] Clinical Social Worker Direct Dial: [phone]  Fax: [phone] Website: [email or web address] 12:14 PM

## 2024-03-29 NOTE — Telephone Encounter (Signed)
 Received call from LCSW after [facility] telephone visit.   LCSW asks I call patient to follow up with SI.   I called patient. Patient was tearful on the phone when asked about current SI. She reports she feels more overwhelmed with life right now than anything. She denies wanting to end her life.   She reports she did have a plan a few days ago. She reports wanting to drive off of a bridge. She denies feeling like this currently.   She denies any active SI or thoughts of harming others.   Hotline number given to patient as well as Behavioral Health walk-in clinic.   Will followup with patient to schedule an apt at [facility].  Behavorial Health apt is not scheduled until August.

## 2024-03-29 NOTE — Patient Instructions (Signed)
 Visit Information  Thank you for taking time to visit with me today.   Tailored Plan Medicaid On April 13, 2023 some people on [place] Medicaid will move to a new kind of Medicaid health plan called a Tailored Plan. Tailored Plans cover your doctor visits, prescription drugs, and health care services.    If your [place] Medicaid will move to a Tailored Plan, you should have gotten a letter and welcome packet. If you're not sure, call your [place] Medicaid Enrollment Broker at [phone] and ask.  Check out these free materials, in [place] and [place], to learn more about your Tailored Plan: [email or web address]/Tailored-Plans/Toolkit  Tailored Care Management Services  TCM services are available to you now. If you are a Tailored Plan member or will be and want information about Tailored Care Management Services including rides to appointments and community and home services, call the Care Management provider for your county of residence:    [facility] [place], [place])  Member Services: 5395235684 Behavioral Health Crisis Line: 3016327968, [place], [place], [place], [place])  Member Services: [phone] Behavioral Health Crisis Line: [phone]     Please call the Suicide and Crisis Lifeline: 988 call the [place]  National Suicide Prevention Lifeline: 365-788-0149 or TTY: 705-859-6875 TTY (347)325-7357) to talk to a trained counselor call 1-800-273-TALK (toll free, 24 hour hotline) go to [facility] [address] [place] [phone]) call 911 if you are experiencing a Mental Health or Behavioral Health Crisis or need someone to talk to.  Patient verbalizes understanding of instructions and care plan provided today and agrees to view in MyChart. Active MyChart status and patient understanding of how to access instructions and care plan via MyChart confirmed with patient.     Arlis Bent [facility]   [facility], [facility] Clinical Social Worker Direct Dial: [phone]  Fax: [phone] Website: [email or web address] 11:04 AM

## 2024-04-01 ENCOUNTER — Other Ambulatory Visit: Payer: Self-pay | Admitting: Licensed Clinical Social Worker

## 2024-04-05 ENCOUNTER — Ambulatory Visit: Payer: Self-pay | Admitting: Student

## 2024-04-05 ENCOUNTER — Encounter: Payer: Self-pay | Admitting: Student

## 2024-04-05 ENCOUNTER — Other Ambulatory Visit (HOSPITAL_COMMUNITY)
Admission: RE | Admit: 2024-04-05 | Discharge: 2024-04-05 | Disposition: A | Discharge status: Home / Self Care | Payer: MEDICAID | Source: Ambulatory Visit | Attending: Family Medicine | Admitting: Family Medicine

## 2024-04-05 ENCOUNTER — Ambulatory Visit (INDEPENDENT_AMBULATORY_CARE_PROVIDER_SITE_OTHER): Payer: MEDICAID | Admitting: Student

## 2024-04-05 VITALS — BP 117/88 | HR 91 | Ht 59.0 in

## 2024-04-05 DIAGNOSIS — Z113 Encounter for screening for infections with a predominantly sexual mode of transmission: Secondary | ICD-10-CM | POA: Diagnosis not present

## 2024-04-05 DIAGNOSIS — N898 Other specified noninflammatory disorders of vagina: Secondary | ICD-10-CM | POA: Diagnosis present

## 2024-04-05 DIAGNOSIS — R300 Dysuria: Secondary | ICD-10-CM | POA: Diagnosis not present

## 2024-04-05 LAB — POCT WET PREP (WET MOUNT)
Clue Cells Wet Prep Whiff POC: NEGATIVE
Trichomonas Wet Prep HPF POC: ABSENT

## 2024-04-05 LAB — POCT URINALYSIS DIP (MANUAL ENTRY)
Bilirubin, UA: NEGATIVE
Glucose, UA: NEGATIVE mg/dL
Ketones, POC UA: NEGATIVE mg/dL
Leukocytes, UA: NEGATIVE
Nitrite, UA: NEGATIVE
Protein Ur, POC: NEGATIVE mg/dL
Spec Grav, UA: 1.015 (ref 1.010–1.025)
Urobilinogen, UA: 0.2 U/dL
pH, UA: 6 (ref 5.0–8.0)

## 2024-04-05 LAB — POCT UA - MICROSCOPIC ONLY: WBC, Ur, HPF, POC: NONE SEEN (ref 0–5)

## 2024-04-05 NOTE — Patient Instructions (Addendum)
 It was great to see you today! Thank you for choosing [facility] for your primary care.  Today we addressed: We are checking her urine and today vaginal swab and screen for STDs.  If you haven't already, sign up for My Chart to have easy access to your labs results, and communication with your primary care physician. We are checking some labs today. If they are abnormal, I will call you. If they are normal, I will send you a MyChart message (if it is active) or a letter in the mail. If you do not hear about your labs in the next 2 weeks, please call the office. Return if symptoms worsen or fail to improve. Please arrive 15 minutes before your appointment to ensure smooth check in process.  We appreciate your efforts in making this happen.  Thank you for allowing me to participate in your care, Kieth Johnson, DO 04/05/2024, 2:42 PM PGY-3, [facility]

## 2024-04-05 NOTE — Assessment & Plan Note (Addendum)
 Moderate blood without nitrites or leukocytes, not consistent with urinary infection.  Microscopy with 0-3 RBCs however has been less prior.  Continue to monitor.  No antibiotics at this time.

## 2024-04-05 NOTE — Assessment & Plan Note (Signed)
 Obtained wet prep for gonorrhea/committee a evaluation, additionally blood work for RPR, hepatitis B/C, HIV.

## 2024-04-05 NOTE — Progress Notes (Signed)
  SUBJECTIVE:   CHIEF COMPLAINT / HPI:   Presents today with concerns of feeling weird down there with small amount of inconsistent vaginal discharge and dysuria.  She is also requesting STD check as she is unsure if her partner has exposed her to STDs.  She has Nexplanon  and therefore does not have regular menstrual cycle.  PERTINENT  PMH / PSH: Prediabetes, GERD, migraines, tobacco use  OBJECTIVE:  BP 117/88   Pulse 91   Ht 4' 11 (1.499 m)   SpO2 99%   BMI 44.72 kg/m  General: Well-appearing, NAD Pelvic exam: VULVA: normal appearing vulva with no masses, tenderness or lesions, VAGINA: normal appearing vagina with normal color and discharge, no lesions, exam chaperoned by Harlene Reiter, CMA.   ASSESSMENT/PLAN:   Assessment & Plan Dysuria Moderate blood without nitrites or leukocytes, not consistent with urinary infection.  Microscopy with 0-3 RBCs however has been less prior.  Continue to monitor.  No antibiotics at this time. Vaginal discharge Wet mount with many bacteria however no clue cells nor yeast, trichomonas absent. Routine screening for STI (sexually transmitted infection) Obtained wet prep for gonorrhea/committee a evaluation, additionally blood work for RPR, hepatitis B/C, HIV. Return if symptoms worsen or fail to improve. Kieth Johnson, DO 04/05/2024, 3:00 PM PGY-3, [facility]

## 2024-04-06 LAB — RPR: RPR Ser Ql: NONREACTIVE

## 2024-04-06 LAB — CERVICOVAGINAL ANCILLARY ONLY
Chlamydia: NEGATIVE
Comment: NEGATIVE
Comment: NEGATIVE
Comment: NORMAL
Neisseria Gonorrhea: NEGATIVE
Trichomonas: NEGATIVE

## 2024-04-06 LAB — HIV ANTIBODY (ROUTINE TESTING W REFLEX): HIV Screen 4th Generation wRfx: NONREACTIVE

## 2024-04-06 LAB — HEPATITIS B SURFACE ANTIGEN: Hepatitis B Surface Ag: NEGATIVE

## 2024-04-06 LAB — HCV INTERPRETATION

## 2024-04-06 LAB — HCV AB W REFLEX TO QUANT PCR: HCV Ab: NONREACTIVE

## 2024-04-06 NOTE — Patient Outreach (Signed)
 Complex Care Management   Visit Note  6/202025  Name:  Katelyn Mcfarland MRN: [id number] DOB: 08/04/1991  Situation: Referral received for Complex Care Management related to Mental/Behavioral Health diagnosis Bipolar I Disorder I obtained verbal consent from Patient.  Visit completed with pt  on the phone  Background:   Past Medical History:  Diagnosis Date   Allergy    Chlamydia    age 34yo; hx/o trichomonas age 74yo   Chronic headache    Depression    doing ok, declined meds   High cholesterol    Infection    UTI   Pregnancy induced hypertension    Trichomonas vaginalis infection    Vaginal Pap smear, abnormal    bx, cryo    Assessment: Patient Reported Symptoms:  Cognitive Cognitive Status: Alert and oriented to person, place, and time, Normal speech and language skills Cognitive/Intellectual Conditions Management [RPT]: None reported or documented in medical history or problem list      Neurological Neurological Review of Symptoms: Not assessed    HEENT HEENT Symptoms Reported: Not assessed      Cardiovascular Cardiovascular Symptoms Reported: Not assessed    Respiratory Respiratory Symptoms Reported: Not assesed    Endocrine Patient reports the following symptoms related to hypoglycemia or hyperglycemia : Not assessed    Gastrointestinal Gastrointestinal Symptoms Reported: Not assessed      Genitourinary Genitourinary Symptoms Reported: Not assessed    Integumentary Integumentary Symptoms Reported: Not assessed    Musculoskeletal Musculoskelatal Symptoms Reviewed: Not assessed        Psychosocial Psychosocial Symptoms Reported: No symptoms reported Additional Psychological Details: Patient reports a decrease in depression symptoms today, denies SI/HI. Pt is spending today with family and friends. Per chart review, pt has spoken with PCP staff, who agreed to f/up to schedule appt with PCP Behavioral Health Conditions: Bipolar affective disorder Behavioral  Management Strategies: Adequate rest, Coping strategies, Support system Major Change/Loss/Stressor/Fears (CP): Medical condition, self, Relationship concerns        01/25/2024   10:48 AM  Depression screen PHQ 2/9  Decreased Interest 1  Down, Depressed, Hopeless 1  PHQ - 2 Score 2  Altered sleeping 0  Tired, decreased energy 3  Change in appetite 1  Feeling bad or failure about yourself  1  Trouble concentrating 2  Moving slowly or fidgety/restless 0  Suicidal thoughts 1  PHQ-9 Score 10    There were no vitals filed for this visit.  Medications Reviewed Today     Reviewed by Ezzard Rolin BIRCH, LCSW (Social Worker) on 04/06/24 at 1042  Med List Status: <None>   Medication Order Taking? Sig Documenting Provider Last Dose Status Informant  acetaminophen  (TYLENOL ) 500 MG tablet [id number]  Take 500 mg by mouth every 6 (six) hours as needed for mild pain. [provider]  Active Self  etonogestrel  (NEXPLANON ) 68 MG IMPL implant [id number]  1 each by Subdermal route once. [provider]  Active   fluconazole  (DIFLUCAN ) 150 MG tablet [id number]  Take one tablet, then take the second tablet 72 hours later Everhart, Kirstie, DO  Active   fluticasone  (FLONASE ) 50 MCG/ACT nasal spray [id number]  Place 2 sprays into both nostrils daily. Christia Budds, MD  Active   naproxen  sodium (ALEVE ) 220 MG tablet [id number]  Take 220 mg by mouth daily as needed (for pain). [provider]  Active Self  Probiotic Product (CULTURELLE PROBIOTICS PO) [id number]  Take by mouth. [provider]  Active Self  Recommendation:   PCP Follow-up Continue Current Plan of Care  Follow Up Plan:   Telephone follow-up 2-4 weeks  Rolin Kerns, LCSW [place]  [facility], [facility] Clinical Social Worker Direct Dial: [phone]  Fax: [phone] Website: [email or web address] 10:49 AM

## 2024-04-06 NOTE — Patient Instructions (Signed)
 Visit Information  Thank you for taking time to visit with me today. Please don't hesitate to contact me if I can be of assistance to you before our next scheduled appointment.  Your next care management appointment is by telephone on 7/7 at 9 AM  Please call the care guide team at [phone] if you need to cancel, schedule, or reschedule an appointment.   Please call the Suicide and Crisis Lifeline: 988 go to [facility] Urgent [facility] [address] [place] [phone]) call 911 if you are experiencing a Mental Health or Behavioral Health Crisis or need someone to talk to.  Rolin Kerns, LCSW [place]  [facility], [facility] Clinical Social Worker Direct Dial: [phone]  Fax: [phone] Website: [email or web address] 10:49 AM

## 2024-04-13 ENCOUNTER — Telehealth: Payer: Self-pay

## 2024-04-13 NOTE — Progress Notes (Deleted)
    SUBJECTIVE:   CHIEF COMPLAINT / HPI:   Vaginal discharge Seen 04/01/2024 for similar concern.  Wet prep with many bacteria but otherwise negative.  UA not suggestive of UTI.  Negative for G/C and trichomoniasis.  PERTINENT  PMH / PSH: ***  OBJECTIVE:   There were no vitals taken for this visit. ***  General: NAD, pleasant, able to participate in exam Cardiac: RRR, no murmurs. Respiratory: CTAB, normal effort, No wheezes, rales or rhonchi Abdomen: Bowel sounds present, nontender, nondistended Extremities: no edema or cyanosis. Skin: warm and dry, no rashes noted Neuro: alert, no obvious focal deficits Psych: Normal affect and mood  ASSESSMENT/PLAN:   No problem-specific Assessment & Plan notes found for this encounter.     Dr. Izetta Nap, DO [place] [facility]    {    This will disappear when note is signed, click to select method of visit    :1}

## 2024-04-13 NOTE — Telephone Encounter (Signed)
 Patient calls nurse line requesting results from last weeks visit.   Advised previous PCP sent her a mychart message with full results.   She reports she feels like her discharge has thickened up. She reports jelly strings of blood when she wipes. She does have nexplanon  in place and has irregular periods.   She reports vaginal irritation, however denies burning or abnormal odors.   She has concerns for pregnancy. She reports her nexplanon  is in two pieces in her arm. She has been told in the past that she is still safe from pregnancy. She reports negative home test 3 weeks ago.   Patient scheduled for tomorrow for evaluation.

## 2024-04-14 ENCOUNTER — Ambulatory Visit: Payer: MEDICAID | Admitting: Family Medicine

## 2024-04-14 DIAGNOSIS — N898 Other specified noninflammatory disorders of vagina: Secondary | ICD-10-CM

## 2024-04-18 ENCOUNTER — Other Ambulatory Visit: Payer: MEDICAID | Admitting: Licensed Clinical Social Worker

## 2024-04-18 NOTE — Patient Outreach (Signed)
 Complex Care Management   Visit Note  04/18/2024  Name:  Katelyn Mcfarland MRN: [id number] DOB: 05-Feb-1991  Situation: Referral received for Complex Care Management related to Mental/Behavioral Health diagnosis Bipolar I Disorder I obtained verbal consent from Patient.  Visit completed with pt  on the phone  Background:   Past Medical History:  Diagnosis Date   Allergy    Chlamydia    age 33yo; hx/o trichomonas age 42yo   Chronic headache    Depression    doing ok, declined meds   High cholesterol    Infection    UTI   Pregnancy induced hypertension    Trichomonas vaginalis infection    Vaginal Pap smear, abnormal    bx, cryo    Assessment: Patient Reported Symptoms:  Cognitive Cognitive Status: No symptoms reported, Normal speech and language skills, Alert and oriented to person, place, and time Cognitive/Intellectual Conditions Management [RPT]: None reported or documented in medical history or problem list      Neurological Neurological Review of Symptoms: Not assessed    HEENT HEENT Symptoms Reported: Not assessed      Cardiovascular Cardiovascular Symptoms Reported: Not assessed    Respiratory Respiratory Symptoms Reported: Not assesed    Endocrine Endocrine Symptoms Reported: Not assessed    Gastrointestinal Gastrointestinal Symptoms Reported: Not assessed      Genitourinary Genitourinary Symptoms Reported: Not assessed    Integumentary Integumentary Symptoms Reported: Not assessed    Musculoskeletal Musculoskelatal Symptoms Reviewed: Not assessed        Psychosocial Psychosocial Symptoms Reported: No symptoms reported Additional Psychological Details: Patient reports that she has decreased in the amount of working overtime, which has assisted in promoting self-care. Healthy coping skills discussed to assist with management of symptoms Behavioral Management Strategies: Adequate rest, Coping strategies, Support system          01/25/2024   10:48 AM   Depression screen PHQ 2/9  Decreased Interest 1  Down, Depressed, Hopeless 1  PHQ - 2 Score 2  Altered sleeping 0  Tired, decreased energy 3  Change in appetite 1  Feeling bad or failure about yourself  1  Trouble concentrating 2  Moving slowly or fidgety/restless 0  Suicidal thoughts 1  PHQ-9 Score 10    There were no vitals filed for this visit.  Medications Reviewed Today   Medications were not reviewed in this encounter     Recommendation:   Continue Current Plan of Care  Follow Up Plan:   Telephone follow-up on 05/12/24  Rolin Kerns, LCSW [place]  [facility], [facility] Clinical Social Worker Direct Dial: [phone]  Fax: [phone] Website: [email or web address] 9:50 AM

## 2024-04-18 NOTE — Patient Instructions (Signed)
 Visit Information  Thank you for taking time to visit with me today.   Tailored Plan Medicaid On April 13, 2023 some people on [place] Medicaid will move to a new kind of Medicaid health plan called a Tailored Plan. Tailored Plans cover your doctor visits, prescription drugs, and health care services.    If your [place] Medicaid will move to a Tailored Plan, you should have gotten a letter and welcome packet. If you're not sure, call your [place] Medicaid Enrollment Broker at [phone] and ask.  Check out these free materials, in [place] and [place], to learn more about your Tailored Plan: [email or web address].Gov/Tailored-Plans/Toolkit  Tailored Care Management Services  TCM services are available to you now. If you are a Tailored Plan member or will be and want information about Tailored Care Management Services including rides to appointments and community and home services, call the Care Management provider for your county of residence:    [facility] [place], [place])  Member Services: [phone] Behavioral Health Crisis Line: 2102618100, [place], [place], [place], [place])  Member Services: [phone] Behavioral Health Crisis Line: [phone]     Please call the Suicide and Crisis Lifeline: 988 go to [facility] [address] [place] [phone]) call 911 if you are experiencing a Mental Health or Behavioral Health Crisis or need someone to talk to.  Patient verbalizes understanding of instructions and care plan provided today and agrees to view in MyChart. Active MyChart status and patient understanding of how to access instructions and care plan via MyChart confirmed with patient.     Rolin Ezzard HUGHS [facility]  [facility], [facility] Clinical Social Worker Direct Dial: [phone]  Fax: [phone] Website: [email or web address] 9:50 AM

## 2024-04-28 ENCOUNTER — Encounter (HOSPITAL_COMMUNITY): Payer: Self-pay | Admitting: Licensed Clinical Social Worker

## 2024-04-28 ENCOUNTER — Ambulatory Visit (INDEPENDENT_AMBULATORY_CARE_PROVIDER_SITE_OTHER): Payer: MEDICAID | Admitting: Licensed Clinical Social Worker

## 2024-04-28 DIAGNOSIS — F331 Major depressive disorder, recurrent, moderate: Secondary | ICD-10-CM | POA: Insufficient documentation

## 2024-04-28 DIAGNOSIS — F4310 Post-traumatic stress disorder, unspecified: Secondary | ICD-10-CM | POA: Diagnosis not present

## 2024-04-28 DIAGNOSIS — F329 Major depressive disorder, single episode, unspecified: Secondary | ICD-10-CM | POA: Diagnosis not present
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
## 2024-05-02 ENCOUNTER — Telehealth (HOSPITAL_COMMUNITY): Payer: Self-pay | Admitting: Professional

## 2024-05-02 NOTE — Telephone Encounter (Signed)
 See call log

## 2024-05-04 ENCOUNTER — Telehealth (HOSPITAL_COMMUNITY): Payer: Self-pay | Admitting: Licensed Clinical Social Worker

## 2024-05-04 NOTE — Telephone Encounter (Signed)
 See call intake

## 2024-05-06 ENCOUNTER — Telehealth (HOSPITAL_COMMUNITY): Payer: Self-pay | Admitting: Professional

## 2024-05-06 NOTE — Telephone Encounter (Signed)
 See call log

## 2024-05-12 ENCOUNTER — Other Ambulatory Visit: Payer: MEDICAID | Admitting: Licensed Clinical Social Worker

## 2024-05-19 NOTE — Patient Outreach (Signed)
 Complex Care Management   Visit Note  05/12/2024  Name:  Katelyn Mcfarland MRN: [id number] DOB: [birth year]  Situation: Referral received for Complex Care Management related to Mental/Behavioral Health diagnosis Bipolar Disorder I obtained verbal consent from Patient.  Visit completed with pt  on the phone  Background:   Past Medical History:  Diagnosis Date   Allergy    Chlamydia    age 33yo; hx/o trichomonas age 69yo   Chronic headache    Depression    doing ok, declined meds   High cholesterol    Infection    UTI   Pregnancy induced hypertension    Trichomonas vaginalis infection    Vaginal Pap smear, abnormal    bx, cryo    Assessment: Patient Reported Symptoms:  Cognitive Cognitive Status: No symptoms reported, Alert and oriented to person, place, and time   Health Maintenance Behaviors: Annual physical exam  Neurological Neurological Review of Symptoms: Not assessed    HEENT HEENT Symptoms Reported: Not assessed      Cardiovascular Cardiovascular Symptoms Reported: Not assessed    Respiratory Respiratory Symptoms Reported: Not assesed    Endocrine Endocrine Symptoms Reported: Not assessed    Gastrointestinal Gastrointestinal Symptoms Reported: Not assessed      Genitourinary Genitourinary Symptoms Reported: Not assessed    Integumentary Integumentary Symptoms Reported: Not assessed    Musculoskeletal Musculoskelatal Symptoms Reviewed: Not assessed        Psychosocial Psychosocial Symptoms Reported: Other Other Psychosocial Conditions: Stress Additional Psychological Details: Patient has reduced the amount of hours she is working to assist with self-care and stress reduction. She has established with [facility] Behavioral Management Strategies: Adequate rest, Coping strategies, Counseling, Support system Major Change/Loss/Stressor/Fears (CP): Medical condition, self Techniques to Cope with Loss/Stress/Change: Diversional activities        04/28/2024   10:08 AM   Depression screen PHQ 2/9  Decreased Interest   Down, Depressed, Hopeless   PHQ - 2 Score   Altered sleeping   Tired, decreased energy   Change in appetite   Feeling bad or failure about yourself    Trouble concentrating   Moving slowly or fidgety/restless   Suicidal thoughts   PHQ-9 Score   Difficult doing work/chores      Information is confidential and restricted. Go to Review Flowsheets to unlock data.    There were no vitals filed for this visit.  Medications Reviewed Today     Reviewed by Ezzard Rolin BIRCH, LCSW (Social Worker) on 05/19/24 at [phone]  Med List Status: <None>   Medication Order Taking? Sig Documenting Provider Last Dose Status Informant  acetaminophen  (TYLENOL ) 500 MG tablet [id number]  Take 500 mg by mouth every 6 (six) hours as needed for mild pain. [provider]  Active Self  etonogestrel  (NEXPLANON ) 68 MG IMPL implant [id number]  1 each by Subdermal route once. [provider]  Active   fluconazole  (DIFLUCAN ) 150 MG tablet [id number]  Take one tablet, then take the second tablet 72 hours later Everhart, Kirstie, DO  Active   fluticasone  (FLONASE ) 50 MCG/ACT nasal spray [id number]  Place 2 sprays into both nostrils daily. Christia Budds, MD  Active   naproxen  sodium (ALEVE ) 220 MG tablet [id number]  Take 220 mg by mouth daily as needed (for pain). [provider]  Active Self  Probiotic Product (CULTURELLE PROBIOTICS PO) [id number]  Take by mouth. [provider]  Active Self            Recommendation:   Continue Current Plan  of Care  Follow Up Plan:   Telephone follow-up in 1 month  Rolin Kerns, LCSW [facility]  [facility], [facility] Clinical Social Worker Direct Dial: [phone]  Fax: [phone] Website: [email or web address] 7:45 AM

## 2024-05-19 NOTE — Patient Instructions (Signed)
 Visit Information  Thank you for taking time to visit with me today.   Tailored Plan Medicaid On April 13, 2023 some people on [place] Medicaid will move to a new kind of Medicaid health plan called a Tailored Plan. Tailored Plans cover your doctor visits, prescription drugs, and health care services.    If your [place] Medicaid will move to a Tailored Plan, you should have gotten a letter and welcome packet. If you're not sure, call your [place] Medicaid Enrollment Broker at [phone] and ask.  Check out these free materials, in [place] and [place], to learn more about your Tailored Plan: [email or web address]/Tailored-Plans/Toolkit  Tailored Care Management Services  TCM services are available to you now. If you are a Tailored Plan member or will be and want information about Tailored Care Management Services including rides to appointments and community and home services, call the Care Management provider for your county of residence:    [facility] [place], [place])  Member Services: [phone] Behavioral Health Crisis Line: [phone], [place], [place], [place], [place])  Member Services: 8730984135 Behavioral Health Crisis Line: [phone]     Please call the Suicide and Crisis Lifeline: 988 go to [facility] [address], [place] [phone]) call 911 if you are experiencing a Mental Health or Behavioral Health Crisis or need someone to talk to.  Patient verbalizes understanding of instructions and care plan provided today and agrees to view in MyChart. Active MyChart status and patient understanding of how to access instructions and care plan via MyChart confirmed with patient.     Rolin Ezzard HUGHS [facility]  [facility], [facility] Clinical Social Worker Direct Dial: [phone]  Fax: [phone] Website: [email or web address] 7:45 AM

## 2024-05-26 ENCOUNTER — Ambulatory Visit (HOSPITAL_COMMUNITY): Payer: MEDICAID | Admitting: Psychiatry
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
## 2024-06-08 ENCOUNTER — Ambulatory Visit (INDEPENDENT_AMBULATORY_CARE_PROVIDER_SITE_OTHER): Payer: MEDICAID | Admitting: Psychiatry

## 2024-06-08 ENCOUNTER — Encounter (HOSPITAL_COMMUNITY): Payer: Self-pay

## 2024-06-08 ENCOUNTER — Encounter (HOSPITAL_COMMUNITY): Payer: Self-pay | Admitting: Psychiatry

## 2024-06-08 DIAGNOSIS — F331 Major depressive disorder, recurrent, moderate: Secondary | ICD-10-CM

## 2024-06-08 NOTE — Patient Instructions (Addendum)
 Thank you for attending your appointment today.  -- We did not start any medications today. As discussed, focus will be on therapy. If you are interested in medications in the future, please let your therapist or clinic staff know.  -- Continue other medications as prescribed. -- Make sure your gun is stored safely: Gun should be stored separately from bullets and both should be locked away/ensure that keys are not accessible to kids.   Here is more information about how to safely store your firearm: https://www.ncsafe.org/safestorage/  Please do not make any changes to medications without first discussing with your provider. If you are experiencing a psychiatric emergency, please call 911 or present to your nearest emergency department. Additional crisis, medication management, and therapy resources are included below.  [facility]  [address] [place], [place] [address] [phone] WALK-IN URGENT CARE 24/7 FOR ANYONE [address] [place], [place]  [phone] Fax: [phone] [email or web address] *Interpreters available *Accepts all insurance and uninsured for Urgent Care needs *Accepts Medicaid and uninsured for outpatient treatment (below)      ONLY FOR [facility]  Below:    Outpatient New Patient Assessment/Therapy Walk-ins:        Monday, Wednesday, and Thursday 8am until slots are full (first come, first served)                   New Patient Psychiatry/Medication Management        Monday-Friday 8am-11am (first come, first served)               For all walk-ins we ask that you arrive by 7:15am, because patients will be seen in the order of arrival.

## 2024-06-17 ENCOUNTER — Other Ambulatory Visit: Payer: Self-pay | Admitting: Licensed Clinical Social Worker

## 2024-06-17 NOTE — Patient Outreach (Signed)
 Complex Care Management   Visit Note  06/17/2024  Name:  Katelyn Mcfarland MRN: [id number] DOB: 1991/08/17  Situation: Referral received for Complex Care Management related to Mental/Behavioral Health diagnosis MDD and PTSD I obtained verbal consent from Patient.  Visit completed with Patient  on the phone  Background:   Past Medical History:  Diagnosis Date   Allergy    Chlamydia    age 33yo; hx/o trichomonas age 59yo   Chronic headache    Depression    doing ok, declined meds   High cholesterol    Infection    UTI   Pregnancy induced hypertension    Trichomonas vaginalis infection    Vaginal Pap smear, abnormal    bx, cryo    Assessment: Patient Reported Symptoms:  Cognitive Cognitive Status: No symptoms reported, Alert and oriented to person, place, and time, Normal speech and language skills      Neurological Neurological Review of Symptoms: Not assessed    HEENT HEENT Symptoms Reported: Not assessed      Cardiovascular Cardiovascular Symptoms Reported: Not assessed    Respiratory Respiratory Symptoms Reported: Not assesed    Endocrine Endocrine Symptoms Reported: Not assessed    Gastrointestinal Gastrointestinal Symptoms Reported: Not assessed      Genitourinary Genitourinary Symptoms Reported: Not assessed    Integumentary Integumentary Symptoms Reported: Not assessed    Musculoskeletal Musculoskelatal Symptoms Reviewed: Not assessed        Psychosocial Psychosocial Symptoms Reported: Alteration in sleep habits Behavioral Management Strategies: Adequate rest, Counseling, Coping strategies, Support system Major Change/Loss/Stressor/Fears (CP): Medical condition, self Techniques to Cope with Loss/Stress/Change: Diversional activities, Counseling      06/17/2024    PHQ2-9 Depression Screening   Little interest or pleasure in doing things    Feeling down, depressed, or hopeless    PHQ-2 - Total Score    Trouble falling or staying asleep, or sleeping too  much    Feeling tired or having little energy    Poor appetite or overeating     Feeling bad about yourself - or that you are a failure or have let yourself or your family down    Trouble concentrating on things, such as reading the newspaper or watching television    Moving or speaking so slowly that other people could have noticed.  Or the opposite - being so fidgety or restless that you have been moving around a lot more than usual    Thoughts that you would be better off dead, or hurting yourself in some way    PHQ2-9 Total Score    If you checked off any problems, how difficult have these problems made it for you to do your work, take care of things at home, or get along with other people    Depression Interventions/Treatment      There were no vitals filed for this visit.  Medications Reviewed Today     Reviewed by Ezzard Rolin BIRCH, LCSW (Social Worker) on 06/17/24 at 1049  Med List Status: <None>   Medication Order Taking? Sig Documenting Provider Last Dose Status Informant  acetaminophen  (TYLENOL ) 500 MG tablet [id number]  Take 500 mg by mouth every 6 (six) hours as needed for mild pain. [provider]  Active Self  etonogestrel  (NEXPLANON ) 68 MG IMPL implant [id number]  1 each by Subdermal route once. [provider]  Active   fluconazole  (DIFLUCAN ) 150 MG tablet [id number]  Take one tablet, then take the second tablet 72 hours later Everhart, Kirstie, DO  Active   fluticasone  (FLONASE ) 50 MCG/ACT nasal spray [id number]  Place 2 sprays into both nostrils daily.  Patient not taking: Reported on 06/08/2024   Christia Budds, MD  Active   ibuprofen  (ADVIL ) 200 MG tablet [id number]  Take 200 mg by mouth every 6 (six) hours as needed. [provider]  Active   naproxen  sodium (ALEVE ) 220 MG tablet [id number]  Take 220 mg by mouth daily as needed (for pain). [provider]  Active Self  Probiotic Product (CULTURELLE PROBIOTICS PO) [id number]  Take by  mouth. [provider]  Active Self            Recommendation:   Specialty provider follow-up Schedule Counseling appt with [facility] Continue Current Plan of Care  Follow Up Plan:   Closing From:  Complex Care Management  Rolin Kerns, LCSW [place]  [facility], [facility] Clinical Social Worker Direct Dial: [phone]  Fax: [phone] Website: [email or web address] 10:56 AM

## 2024-06-17 NOTE — Patient Instructions (Signed)
 Visit Information  Thank you for taking time to visit with me today.   Tailored Plan Medicaid On April 13, 2023 some people on [place] Medicaid will move to a new kind of Medicaid health plan called a Tailored Plan. Tailored Plans cover your doctor visits, prescription drugs, and health care services.    If your [place] Medicaid will move to a Tailored Plan, you should have gotten a letter and welcome packet. If you're not sure, call your [place] Medicaid Enrollment Broker at [phone] and ask.  Check out these free materials, in [place] and [place], to learn more about your Tailored Plan: [email or web address].Gov/Tailored-Plans/Toolkit  Tailored Care Management Services  TCM services are available to you now. If you are a Tailored Plan member or will be and want information about Tailored Care Management Services including rides to appointments and community and home services, call the Care Management provider for your county of residence:    [facility] [employer], [place])  Member Services: [phone] Behavioral Health Crisis Line: 949-441-7395, [place], [place], [place], [place])  Member Services: 6470289082 Behavioral Health Crisis Line: 450-500-4070     Please call the Suicide and Crisis Lifeline: 988 call 1-800-273-TALK (toll free, 24 hour hotline) go to [facility] [address], [place] [phone]) call 911 if you are experiencing a Mental Health or Behavioral Health Crisis or need someone to talk to.  Patient verbalizes understanding of instructions and care plan provided today and agrees to view in MyChart. Active MyChart status and patient understanding of how to access instructions and care plan via MyChart confirmed with patient.     Rolin Ezzard HUGHS [facility]  [facility], [facility] Clinical Social Worker Direct Dial: [phone]  Fax: [phone] Website:  [email or web address] 10:56 AM

## 2024-06-20 ENCOUNTER — Ambulatory Visit: Payer: MEDICAID

## 2024-06-20 VITALS — BP 134/89 | HR 98 | Ht 59.0 in | Wt 213.8 lb

## 2024-06-20 DIAGNOSIS — R32 Unspecified urinary incontinence: Secondary | ICD-10-CM

## 2024-06-20 DIAGNOSIS — F331 Major depressive disorder, recurrent, moderate: Secondary | ICD-10-CM

## 2024-06-20 DIAGNOSIS — R300 Dysuria: Secondary | ICD-10-CM

## 2024-06-20 DIAGNOSIS — Z23 Encounter for immunization: Secondary | ICD-10-CM | POA: Diagnosis not present

## 2024-06-20 DIAGNOSIS — R7303 Prediabetes: Secondary | ICD-10-CM | POA: Diagnosis not present

## 2024-06-20 DIAGNOSIS — Z0000 Encounter for general adult medical examination without abnormal findings: Secondary | ICD-10-CM

## 2024-06-20 DIAGNOSIS — R030 Elevated blood-pressure reading, without diagnosis of hypertension: Secondary | ICD-10-CM

## 2024-06-20 LAB — POCT URINALYSIS DIP (MANUAL ENTRY)
Bilirubin, UA: NEGATIVE
Glucose, UA: NEGATIVE mg/dL
Ketones, POC UA: NEGATIVE mg/dL
Leukocytes, UA: NEGATIVE
Nitrite, UA: NEGATIVE
Protein Ur, POC: NEGATIVE mg/dL
Spec Grav, UA: 1.015 (ref 1.010–1.025)
Urobilinogen, UA: 0.2 U/dL
pH, UA: 7 (ref 5.0–8.0)

## 2024-06-20 LAB — POCT UA - MICROSCOPIC ONLY: WBC, Ur, HPF, POC: NONE SEEN (ref 0–5)

## 2024-06-20 LAB — POCT GLYCOSYLATED HEMOGLOBIN (HGB A1C): Hemoglobin A1C: 5.4 % (ref 4.0–5.6)

## 2024-06-20 NOTE — Progress Notes (Signed)
    SUBJECTIVE:   Chief compliant/HPI: annual examination  Katelyn Mcfarland is a 33 y.o. who presents today for an annual exam.   Review of systems form notable for dysuria, vaginal itching, urinary incontinence, all chronic and previously evaluated.   Updated history tabs and problem list.   Family history of Breast cancer, colon cancer in grandparents but uncertain what age. No cancer history in immediate family.   OBJECTIVE:   BP 134/89   Pulse 98   Ht 4' 11 (1.499 m)   Wt 213 lb 12.8 oz (97 kg)   SpO2 98%   BMI 43.18 kg/m    General: Well appearing, obese female HEENT: no thyromegaly, no lymphadenopathy CV: RRR, no murmurs, peripheral pulse 2+ Pulm: CTAB   ASSESSMENT/PLAN:   Assessment & Plan Prediabetes A1c within normal limits today, despite prior elevation Reviewed diet and exercise recommendations  Dysuria Urinary incontinence, unspecified type UA dipstick and microscopy ordered today. Small blood but otherwise normal. Not on menstrual cycle today. Multiple previous UA with hematuria, patient reports not on menses then. US  renal from 2021 without renal abnormality. NIH Urinary diary provided to be completed for 3 days prior to follow up.  Made follow up in one week for reevaluation. Consider repeat renal US  and referral to Urogyn Encounter for immunization Flu and Prevnar vaccines administered today.  Elevated BP without diagnosis of hypertension Recheck BP 134/89. Above goal <130/80. Several normal BP in the last several months. She will check her blood pressure once weekly at home and bring her log back in the future so we can evaluate her ambulatory pressures.  Major depressive disorder, recurrent episode, moderate (HCC) No current SI. Saw Psychiatry and received referral to therapy; however, she has been unable to get established with therapy. Provided number for therapy referral that was in chart. Morbid obesity (HCC) Reviewed diet and exercise  recommendations, information provided. Routine adult health maintenance  Annual Examination  See AVS for age appropriate recommendations.   PHQ score 0, reviewed and discussed.  The patient currently uses nexplanon  and abstinence for contraception.   Considered the following items based upon USPSTF recommendations: STI screening negative earlier this year without new sexual partners. Currently abstinent.   Lipid panel (nonfasting or fasting) discussed based upon AHA recommendations and ordered.  Previously slight elevation in 2023. Hep B antibody ordered as no documented hep B vaccine   Discussed family history, BRCA testing not indicated.  Cervical cancer screening: prior Pap reviewed, repeat due in 07/2027  MyChart Activation:Already signed up  Follow up in 1 week for urinary symptoms.    Katelyn Mcfarland Alena Morrison, MD [facility]

## 2024-06-20 NOTE — Patient Instructions (Addendum)
 Lab work - we are checking your cholesterol and hepatitis B immunity. Your blood sugar was good!   Bladder discomfort - we will check your urine today. Please complete the bladder diary for three different days so we can discuss at your follow up.  Blood pressure - check your blood pressure at home or a pharmacy once a week, so that we can have an idea of what it is like at home.   Please call [facility] in order to get scheduled for therapy at [phone].

## 2024-06-20 NOTE — Assessment & Plan Note (Signed)
 No current SI. Saw Psychiatry and received referral to therapy; however, she has been unable to get established with therapy. Provided number for therapy referral that was in chart.

## 2024-06-20 NOTE — Assessment & Plan Note (Signed)
 A1c within normal limits today, despite prior elevation Reviewed diet and exercise recommendations

## 2024-06-20 NOTE — Assessment & Plan Note (Addendum)
 UA dipstick and microscopy ordered today. Small blood but otherwise normal. Not on menstrual cycle today. Multiple previous UA with hematuria, patient reports not on menses then. US  renal from 2021 without renal abnormality. NIH Urinary diary provided to be completed for 3 days prior to follow up.  Made follow up in one week for reevaluation. Consider repeat renal US  and referral to Urogyn

## 2024-06-21 LAB — HEPATITIS B SURFACE ANTIBODY, QUANTITATIVE: Hepatitis B Surf Ab Quant: 3330 m[IU]/mL

## 2024-06-21 LAB — LIPID PANEL
Chol/HDL Ratio: 4.1 ratio (ref 0.0–4.4)
Cholesterol, Total: 206 mg/dL — ABNORMAL HIGH (ref 100–199)
HDL: 50 mg/dL (ref >39)
LDL Chol Calc (NIH): 142 mg/dL — ABNORMAL HIGH (ref 0–99)
Triglycerides: 76 mg/dL (ref 0–149)
VLDL Cholesterol Cal: 14 mg/dL (ref 5–40)

## 2024-06-23 ENCOUNTER — Ambulatory Visit: Payer: Self-pay

## 2024-06-27 ENCOUNTER — Encounter (INDEPENDENT_AMBULATORY_CARE_PROVIDER_SITE_OTHER): Payer: MEDICAID

## 2024-06-27 NOTE — Progress Notes (Signed)
 Patient rescheduled before being seen by provider due to an emergency. Preceptor aware.

## 2024-07-04 ENCOUNTER — Ambulatory Visit: Payer: Self-pay

## 2024-07-04 ENCOUNTER — Ambulatory Visit (INDEPENDENT_AMBULATORY_CARE_PROVIDER_SITE_OTHER): Payer: MEDICAID

## 2024-07-04 VITALS — BP 118/84 | HR 87 | Ht 59.0 in | Wt 215.0 lb

## 2024-07-04 DIAGNOSIS — R3915 Urgency of urination: Secondary | ICD-10-CM | POA: Diagnosis not present

## 2024-07-04 DIAGNOSIS — R102 Pelvic and perineal pain: Secondary | ICD-10-CM | POA: Diagnosis not present

## 2024-07-04 DIAGNOSIS — N898 Other specified noninflammatory disorders of vagina: Secondary | ICD-10-CM | POA: Diagnosis not present

## 2024-07-04 LAB — POCT WET PREP (WET MOUNT)
Clue Cells Wet Prep Whiff POC: NEGATIVE
Trichomonas Wet Prep HPF POC: ABSENT

## 2024-07-04 MED ORDER — FLUCONAZOLE 150 MG PO TABS: 150.0000 mg | ORAL_TABLET | Freq: Once | ORAL | 0 refills | Status: AC

## 2024-07-04 NOTE — Patient Instructions (Signed)
 It was wonderful to see you today.  For your referral to urogynecology, they will call you to set up an appointment. Please give us  a call if you don't hear back in the next 2 weeks.  If your lab testing from today is abnormal, I will call you. Otherwise, I will send you a mychart message.   Please call [phone] with any questions about today's appointment.  Zaria Taha Alena Morrison, MD  Family Medicine

## 2024-07-04 NOTE — Progress Notes (Signed)
    SUBJECTIVE:   CHIEF COMPLAINT / HPI: bladder discomfort  She has had bladder discomfort symptoms for years now. Notes urinary urgency, urinary frequency. Last gc/chlamydia testing and wet prep in June 2025. No sexual partners since then. Multiple urinalyses without evidence of UTI. She filled out a bladder diary and brought it in previously; however, it did not get scanned into the chart and I cannot find it for review. Doing the bladder diary, she did notice that she had frequent urge to urinate when she was resting in the afternoon despite drinking very little. She did have coffee but that was in the morning. She drinks a lot of carbonated drinks. She is trying to quit smoking.   PERTINENT  PMH / PSH: tobacco use, MDD, prediabetes  OBJECTIVE:   BP 118/84   Pulse 87   Ht 4' 11 (1.499 m)   Wt 215 lb (97.5 kg)   SpO2 100%   BMI 43.42 kg/m    Pelvic exam chaperoned by Nelson Olives, CMA [place], thick white discharge noted at the introitus and along the vaginal walls. Vagina is pink and well rugated. No lesions noted. Bimanual exam notable for tenderness along the anterior vaginal wall. No bulging, mass, or deformity noted.   A1c 01/21/24 5.4  UA microscopic 06/20/24 0-3 RBC Urinalysis 06/20/24 small blood, negative nitrites and leukocytes  ASSESSMENT/PLAN:   Assessment & Plan Suprapubic discomfort Urgency of urination Years of suprapubic discomfort and urinary frequency, urgency.  Exam with anterior vaginal wall tenderness, but no notable deformity.  Recent UA/microscopic on 06/20/24 without evidence of UTI, no change in symptoms since then, so will not repeat today. Small-moderate blood on multiple urinalysis for the last year with no concurrent UTI, not menstruating, but UA microscopic with RBC 0-3 (normal) for the last year.  Renal US  in 2021 without abnormalities. Bladder irritants discussed, specifically carbonation as she drinks a lot of carbonated drinks.  Smoking cessation  counseling performed. She is working on quitting. Quit-now line given. Referral to urogynecology for evaluation of interstitial cystitis/bladder pain syndrome or other diagnosis given chronicity of symptoms without signs of UTI.  She will call back to follow up with us  as needed. Vaginal discharge Clinical concern for yeast infection. Wet prep today.  Will treat appropriately pending results.     Quincey Quesinberry Katelyn Morrison, MD [facility]

## 2024-08-09 ENCOUNTER — Ambulatory Visit (INDEPENDENT_AMBULATORY_CARE_PROVIDER_SITE_OTHER): Payer: MEDICAID

## 2024-08-09 DIAGNOSIS — Z111 Encounter for screening for respiratory tuberculosis: Secondary | ICD-10-CM

## 2024-08-09 NOTE — Progress Notes (Signed)
 Patient presents to nurse clinic for TB screening. Patient reports that employer is requesting Quantifer[employer] Gold.   Spoke with Dr. Anders who provided verbal orders for Quant Gold.   Provided patient with immunization record and letters from previous negative PPD results.   Advised patient that we would reach out once we received lab results.   Chiquita JAYSON English, RN

## 2024-08-12 LAB — QUANTIFERON-TB GOLD PLUS
QuantiFERON Mitogen Value: >10 [IU]/mL
QuantiFERON Nil Value: 0.03 [IU]/mL
QuantiFERON TB1 Ag Value: 0.07 [IU]/mL
QuantiFERON TB2 Ag Value: 0.07 [IU]/mL
QuantiFERON-TB Gold Plus: NEGATIVE

## 2024-08-30 ENCOUNTER — Telehealth: Payer: Self-pay

## 2024-08-30 NOTE — Telephone Encounter (Signed)
 Patient leaves VM on nurse line requesting a PSY referral.   She reports she would like to get back into counseling and anger management.   Called patient back.   Number given for behavioral health therapy. See mychart message from 8/27.  Advised to call back if she wasn't able to schedule counseling.   Patient was appreciative.

## 2024-08-31 ENCOUNTER — Encounter: Payer: Self-pay | Admitting: Family Medicine

## 2024-08-31 ENCOUNTER — Telehealth: Payer: Self-pay

## 2024-08-31 NOTE — Telephone Encounter (Signed)
 Pt dropped off form at front desk for Work related.  Verified that patient section of form has been completed.  Last DOS/WCC with PCP was 08/09/2024.  Placed form in [facility] team folder to be completed by clinical staff.  Katelyn Mcfarland

## 2024-09-01 NOTE — Telephone Encounter (Signed)
 Reviewed and completed sections on form.  Placed in provider's box for completion.  Cena JONELLE Pesa, CMA

## 2024-09-02 NOTE — Telephone Encounter (Signed)
 Form placed up front for pick up.   Copy made for batch scanning.   Mychart message sent to patient.

## 2024-09-27 ENCOUNTER — Encounter: Payer: Self-pay | Admitting: Family Medicine

## 2024-09-27 ENCOUNTER — Ambulatory Visit: Payer: MEDICAID | Admitting: Family Medicine

## 2024-09-27 VITALS — BP 137/90 | HR 98 | Temp 99.1°F | Ht 59.0 in | Wt 213.1 lb

## 2024-09-27 DIAGNOSIS — H9201 Otalgia, right ear: Secondary | ICD-10-CM | POA: Diagnosis not present

## 2024-09-27 DIAGNOSIS — J029 Acute pharyngitis, unspecified: Secondary | ICD-10-CM | POA: Diagnosis not present

## 2024-09-27 MED ORDER — AMOXICILLIN-POT CLAVULANATE 500-125 MG PO TABS: 1.0000 | ORAL_TABLET | Freq: Two times a day (BID) | ORAL | 0 refills | Status: AC

## 2024-09-27 NOTE — Progress Notes (Signed)
° ° °  SUBJECTIVE:   CHIEF COMPLAINT / HPI: ear pain sore throat  Discussed the use of AI scribe software for clinical note transcription with the patient, who gave verbal consent to proceed.  History of Present Illness Katelyn Mcfarland is a 33 year old female who presents with severe ear and throat pain.  Otalgia - Severe, progressively worsening right ear pain for over one week - Pain is now more severe than throat pain - Pain is debilitating and limits daily activities - Uses over-the-counter ear drops and ibuprofen  for relief, with only 1 to 2 hours of benefit - Ibuprofen  1000 mg taken every 4 to 6 hours  Pharyngalgia - Throat pain present but less severe than right ear pain  Fever and constitutional symptoms - Low-grade subjective fevers - Alternating hot and cold sensations - No temperature measurement at home - No nausea or vomiting  Nasal congestion and epistaxis - Persistent nasal congestion - Recurrent nosebleeds with clots - Uses Mucinex for symptom relief - Not taking allergy medications such as Zyrtec    PERTINENT  PMH / PSH: GERD, SCT, Major Depression, PTSD  OBJECTIVE:   BP (!) 137/90   Pulse 98   Temp 99.1 F (37.3 C) (Oral)   Ht 4' 11 (1.499 m)   Wt 213 lb 2 oz (96.7 kg)   SpO2 98%   BMI 43.05 kg/m   Physical Exam General: NAD, teary, upset, non-ill appearing, non-toxic Neuro: A&O HEENT: Pupils equal round and reactive, moist mucous membranes, moderate posterior oropharyngeal erythema, mild symmetric bilateral erythema and exudates, bilateral nasal turbinates inflamed, no rhinorrhea, external auditory canals normal, bilateral TMs visible, no erythema, bilateral clear cone of light, no effusion, no TTP with sinus palpation Cardiovascular: RRR, no murmurs,  Respiratory: normal WOB on RA, CTAB, no wheezes, ronchi or rales Abdomen: soft, NTTP, no rebound or guarding Extremities: Moving all 4 extremities equally, no peripheral  edema    ASSESSMENT/PLAN:   Assessment & Plan Sore throat Right ear pain 7 day history of worsening symptoms worrying for bacterial infection. Unclear why son was treated with antibiotic if positive for the Flu. Will lean on cautious side given extent of symptoms and treat as below. No evidence AOM. Ear pain likely secondary to Eustachian tube dysfunction. - Alternate acetaminophen  and ibuprofen  every three hours, max 600 mg ibuprofen  per dose, 2400 mg per day. - Consider cetirizine (Zyrtec) and Flonase  as an option for congestion. - Start Augmentin  500-125 BID for 5 days - Group A strep culture   Return in about 3 days (around 09/30/2024) for If not improving.  Ozell Provencal, MD, PGY-3 [facility] Family Medicine 10:35 AM 09/27/2024  [facility]

## 2024-09-27 NOTE — Patient Instructions (Addendum)
 It was great to see you! Thank you for allowing me to participate in your care!  Our plans for today:   VISIT SUMMARY: Today, you were seen for severe ear and throat pain that has been progressively worsening over the past week. You also reported low-grade fevers, nasal congestion, and recurrent nosebleeds.  YOUR PLAN: SEVERE EAR PAIN AND CONGESTION: Your severe ear pain and congestion are likely due to bacterial sinusitis.  - I have sent the antibiotic Augmentin  to your pharmacy, you will take this twice daily for 5 days - Do not exceed 600 mg of ibuprofen  per dose and 2400 mg per day. -Consider taking cetirizine (Zyrtec) and/or Flonase  over the counter for congestion. -If your symptoms do not improve in 2-3 days, please call the clinic to return to be seen   Please arrive 15 minutes PRIOR to your next scheduled appointment time! If you do not, this affects OTHER patients' care.  Take care and seek immediate care sooner if you develop any concerns.   Ozell Provencal, MD, PGY-3 [facility] 10:22 AM 09/27/2024  [facility]

## 2024-09-29 ENCOUNTER — Ambulatory Visit: Payer: Self-pay | Admitting: Family Medicine

## 2024-09-30 LAB — CULTURE, GROUP A STREP

## 2024-10-26 ENCOUNTER — Other Ambulatory Visit (HOSPITAL_COMMUNITY)
Admission: RE | Admit: 2024-10-26 | Discharge: 2024-10-26 | Disposition: A | Discharge status: Home / Self Care | Payer: MEDICAID | Source: Ambulatory Visit | Attending: Obstetrics and Gynecology | Admitting: Obstetrics and Gynecology

## 2024-10-26 ENCOUNTER — Encounter: Payer: Self-pay | Admitting: Obstetrics and Gynecology

## 2024-10-26 ENCOUNTER — Ambulatory Visit (INDEPENDENT_AMBULATORY_CARE_PROVIDER_SITE_OTHER): Payer: MEDICAID | Admitting: Obstetrics and Gynecology

## 2024-10-26 VITALS — BP 122/77 | HR 82 | Ht 60.0 in | Wt 213.0 lb

## 2024-10-26 DIAGNOSIS — R159 Full incontinence of feces: Secondary | ICD-10-CM | POA: Insufficient documentation

## 2024-10-26 DIAGNOSIS — K6289 Other specified diseases of anus and rectum: Secondary | ICD-10-CM

## 2024-10-26 DIAGNOSIS — N3281 Overactive bladder: Secondary | ICD-10-CM | POA: Diagnosis not present

## 2024-10-26 DIAGNOSIS — R350 Frequency of micturition: Secondary | ICD-10-CM

## 2024-10-26 DIAGNOSIS — R319 Hematuria, unspecified: Secondary | ICD-10-CM

## 2024-10-26 LAB — URINALYSIS, COMPLETE (UACMP) WITH MICROSCOPIC
Bilirubin Urine: NEGATIVE
Glucose, UA: NEGATIVE mg/dL
Ketones, ur: NEGATIVE mg/dL
Leukocytes,Ua: NEGATIVE
Nitrite: NEGATIVE
Protein, ur: NEGATIVE mg/dL
RBC / HPF: >50 RBC/hpf (ref 0–5)
Specific Gravity, Urine: 1.01 (ref 1.005–1.030)
pH: 6 (ref 5.0–8.0)

## 2024-10-26 LAB — POCT URINALYSIS DIP (CLINITEK)
Bilirubin, UA: NEGATIVE
Bilirubin, UA: NEGATIVE
Glucose, UA: NEGATIVE mg/dL
Glucose, UA: NEGATIVE mg/dL
Ketones, POC UA: NEGATIVE mg/dL
Ketones, POC UA: NEGATIVE mg/dL
Leukocytes, UA: NEGATIVE
Leukocytes, UA: NEGATIVE
Nitrite, UA: NEGATIVE
Nitrite, UA: NEGATIVE
POC PROTEIN,UA: NEGATIVE
POC PROTEIN,UA: NEGATIVE
Spec Grav, UA: 1.015
Spec Grav, UA: 1.015
Urobilinogen, UA: 0.2 U/dL
Urobilinogen, UA: 0.2 U/dL
pH, UA: 6
pH, UA: 6

## 2024-10-26 MED ORDER — TROSPIUM CHLORIDE ER 60 MG PO CP24: 1.0000 | ORAL_CAPSULE | Freq: Every day | ORAL | 5 refills | Status: DC

## 2024-10-26 NOTE — Patient Instructions (Addendum)
 Today we talked about ways to manage bladder urgency such as altering your diet to avoid irritative beverages and foods (bladder diet) as well as attempting to decrease stress and other exacerbating factors.    The Most Bothersome Foods* The Least Bothersome Foods*  Coffee - Regular & Decaf Tea - caffeinated Carbonated beverages - cola, non-colas, diet & caffeine -free Alcohols - Beer, Red Wine, White Wine, Champagne Fruits - Grapefruit, Dike, Orange, [employer] - Cranberry, Grapefruit, Orange, Pineapple Vegetables - Tomato & Tomato Products Flavor Enhancers - Hot peppers, Spicy foods, Chili, Horseradish, Vinegar, Monosodium glutamate (MSG) Artificial Sweeteners - NutraSweet, Sweet 'N Low, Equal (sweetener), Saccharin Ethnic foods - Mexican, Thai, Indian food [employer] - low-fat & whole Fruits - Bananas, Blueberries, Honeydew melon, Pears, Raisins, Watermelon Vegetables - Broccoli, [address] Sprouts, [place], Carrots, Cauliflower, [place], Cucumber, Mushrooms, Peas, Radishes, Squash, Zucchini, White potatoes, Sweet potatoes & yams Poultry - Chicken, Eggs, Turkey, [employer] - Beef, Diplomatic Services Operational Officer, Lamb Seafood - Shrimp, [place] fish, Salmon Grains - Oat, Rice Snacks - Pretzels, Popcorn  *Mitch ALF et al. Diet and its role in interstitial cystitis/bladder pain syndrome (IC/BPS) and comorbid conditions. BJU [employer]. BJU Int. 2012 Jan 11.   Accidental Bowel Leakage: Our goal is to achieve formed bowel movements daily or every-other-day without leakage.  You may need to try different combinations of the following options to find what works best for you.  Some management options include: Dietary changes (more leafy greens, vegetables and fruits; less processed foods) Fiber supplementation (Metamucil or something with psyllium as active ingredient)- take daily Over-the-counter imodium (tablets or liquid) to help solidify the stool and prevent leakage of stool. If you get constipated you can  use Miralax  as needed to achieve bowel movements.

## 2024-10-26 NOTE — Assessment & Plan Note (Signed)
-   We discussed the symptoms of overactive bladder (OAB), which include urinary urgency, urinary frequency, nocturia, with or without urge incontinence.  While we do not know the exact etiology of OAB, several treatment options exist. We discussed management including behavioral therapy (decreasing bladder irritants, urge suppression strategies, timed voids, bladder retraining), physical therapy, medication.  - Recommended elimination or significant reduction in caffeine  and irritative beverages as this is likely contributing to her symptoms.  - She is interested in pelvic floor exercises and has done PT previously but cannot accommodate with her schedule right now. AUGS handout provided on pelvic floor muscle and bladder training.  - Prescribed trospium  60mg  ER daily. For anticholinergic medications, we discussed the potential side effects of anticholinergics including dry eyes, dry mouth, constipation, cognitive impairment and urinary retention.

## 2024-10-26 NOTE — Assessment & Plan Note (Signed)
-   Increased diarrhea and rectal pain. Will refer to GI for evaluation - recommended adding daily fiber supplement for stool bulking
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
## 2024-10-27 LAB — URINE CULTURE: Culture: NO GROWTH

## 2024-10-31 ENCOUNTER — Telehealth: Payer: Self-pay | Admitting: Pharmacy Technician

## 2024-10-31 ENCOUNTER — Other Ambulatory Visit (HOSPITAL_COMMUNITY): Payer: Self-pay

## 2024-10-31 DIAGNOSIS — N3281 Overactive bladder: Secondary | ICD-10-CM

## 2024-10-31 NOTE — Telephone Encounter (Signed)
 Pharmacy Patient Advocate Encounter   Received notification from [facility] that prior authorization for Trospium  Chloride 60mg  er capsules is required/requested.   Insurance verification completed.   The patient is insured through [employer] [employer].   Per test claim:  see below is preferred by the insurance.  If suggested medication is appropriate, Please send in a new RX and discontinue this one. If not, please advise as to why it's not appropriate so that we may request a Prior Authorization. Please note, some preferred medications may still require a PA.  If the suggested medications have not been trialed and there are no contraindications to their use, the PA will not be submitted, as it will not be approved. Archived Key:

## 2024-11-02 MED ORDER — MIRABEGRON ER 25 MG PO TB24: 25.0000 mg | ORAL_TABLET | Freq: Every day | ORAL | 5 refills | Status: AC

## 2024-11-02 NOTE — Telephone Encounter (Signed)
 Myrbetriq  ordered as alternative to trospium .

## 2024-11-02 NOTE — Telephone Encounter (Signed)
 Attempted to contact patient. LVMTRC

## 2024-12-09 ENCOUNTER — Ambulatory Visit: Payer: MEDICAID | Admitting: Obstetrics and Gynecology
